# Patient Record
Sex: Male | Born: 1950 | Race: White | Hispanic: No | Marital: Married | Smoking: Former smoker
Health system: Southern US, Community
[De-identification: ages and names within clinical notes are randomized; demographics above are authoritative.]

## PROBLEM LIST (undated history)

## (undated) DIAGNOSIS — C801 Malignant (primary) neoplasm, unspecified: Secondary | ICD-10-CM

## (undated) DIAGNOSIS — Z9889 Other specified postprocedural states: Secondary | ICD-10-CM

## (undated) DIAGNOSIS — K589 Irritable bowel syndrome without diarrhea: Secondary | ICD-10-CM

## (undated) DIAGNOSIS — Z973 Presence of spectacles and contact lenses: Secondary | ICD-10-CM

## (undated) DIAGNOSIS — M48061 Spinal stenosis, lumbar region without neurogenic claudication: Secondary | ICD-10-CM

## (undated) DIAGNOSIS — K219 Gastro-esophageal reflux disease without esophagitis: Secondary | ICD-10-CM

## (undated) DIAGNOSIS — R3915 Urgency of urination: Secondary | ICD-10-CM

## (undated) DIAGNOSIS — M1990 Unspecified osteoarthritis, unspecified site: Secondary | ICD-10-CM

## (undated) DIAGNOSIS — K5900 Constipation, unspecified: Secondary | ICD-10-CM

## (undated) DIAGNOSIS — Z87442 Personal history of urinary calculi: Secondary | ICD-10-CM

## (undated) DIAGNOSIS — R350 Frequency of micturition: Secondary | ICD-10-CM

## (undated) DIAGNOSIS — G43909 Migraine, unspecified, not intractable, without status migrainosus: Secondary | ICD-10-CM

## (undated) DIAGNOSIS — R10A1 Flank pain, right side: Secondary | ICD-10-CM

## (undated) DIAGNOSIS — R109 Unspecified abdominal pain: Secondary | ICD-10-CM

## (undated) DIAGNOSIS — E039 Hypothyroidism, unspecified: Secondary | ICD-10-CM

## (undated) DIAGNOSIS — R011 Cardiac murmur, unspecified: Secondary | ICD-10-CM

## (undated) DIAGNOSIS — T7840XA Allergy, unspecified, initial encounter: Secondary | ICD-10-CM

## (undated) DIAGNOSIS — N289 Disorder of kidney and ureter, unspecified: Secondary | ICD-10-CM

## (undated) DIAGNOSIS — Z85820 Personal history of malignant melanoma of skin: Secondary | ICD-10-CM

## (undated) DIAGNOSIS — G629 Polyneuropathy, unspecified: Secondary | ICD-10-CM

## (undated) DIAGNOSIS — E785 Hyperlipidemia, unspecified: Secondary | ICD-10-CM

## (undated) DIAGNOSIS — Z859 Personal history of malignant neoplasm, unspecified: Secondary | ICD-10-CM

## (undated) DIAGNOSIS — N201 Calculus of ureter: Secondary | ICD-10-CM

## (undated) HISTORY — PX: FRACTURE SURGERY: SHX138

## (undated) HISTORY — PX: HEAD & NECK SKIN LESION EXCISIONAL BIOPSY: SUR472

## (undated) HISTORY — PX: COSMETIC SURGERY: SHX468

## (undated) HISTORY — DX: Migraine, unspecified, not intractable, without status migrainosus: G43.909

## (undated) HISTORY — DX: Allergy, unspecified, initial encounter: T78.40XA

## (undated) HISTORY — PX: TONSILLECTOMY AND ADENOIDECTOMY: SUR1326

## (undated) HISTORY — DX: Gastro-esophageal reflux disease without esophagitis: K21.9

## (undated) HISTORY — DX: Polyneuropathy, unspecified: G62.9

## (undated) HISTORY — PX: JOINT REPLACEMENT: SHX530

## (undated) HISTORY — DX: Irritable bowel syndrome, unspecified: K58.9

## (undated) HISTORY — PX: HERNIA REPAIR: SHX51

## (undated) HISTORY — PX: MENISCUS REPAIR: SHX5179

## (undated) HISTORY — PX: BREAST SURGERY: SHX581

## (undated) HISTORY — PX: CARDIOVASCULAR STRESS TEST: SHX262

## (undated) HISTORY — PX: CARDIAC CATHETERIZATION: SHX172

---

## 1990-06-10 HISTORY — PX: KNEE ARTHROSCOPY W/ ACL RECONSTRUCTION: SHX1858

## 2001-02-20 ENCOUNTER — Ambulatory Visit (HOSPITAL_COMMUNITY): Admission: RE | Admit: 2001-02-20 | Discharge: 2001-02-20 | Payer: Self-pay | Admitting: Otolaryngology

## 2001-02-20 ENCOUNTER — Encounter: Payer: Self-pay | Admitting: Otolaryngology

## 2001-05-14 ENCOUNTER — Emergency Department (HOSPITAL_COMMUNITY): Admission: EM | Admit: 2001-05-14 | Discharge: 2001-05-14 | Payer: Self-pay | Admitting: Emergency Medicine

## 2001-05-14 ENCOUNTER — Encounter: Payer: Self-pay | Admitting: Emergency Medicine

## 2002-02-09 ENCOUNTER — Ambulatory Visit (HOSPITAL_COMMUNITY): Admission: RE | Admit: 2002-02-09 | Discharge: 2002-02-09 | Payer: Self-pay | Admitting: Internal Medicine

## 2003-03-02 ENCOUNTER — Encounter: Payer: Self-pay | Admitting: Orthopaedic Surgery

## 2003-03-02 ENCOUNTER — Ambulatory Visit (HOSPITAL_COMMUNITY): Admission: RE | Admit: 2003-03-02 | Discharge: 2003-03-02 | Payer: Self-pay | Admitting: Orthopaedic Surgery

## 2003-04-19 ENCOUNTER — Ambulatory Visit (HOSPITAL_COMMUNITY): Admission: RE | Admit: 2003-04-19 | Discharge: 2003-04-19 | Payer: Self-pay | Admitting: Family Medicine

## 2004-06-10 HISTORY — PX: SHOULDER ARTHROSCOPY WITH OPEN ROTATOR CUFF REPAIR: SHX6092

## 2004-12-06 ENCOUNTER — Ambulatory Visit (HOSPITAL_COMMUNITY): Admission: RE | Admit: 2004-12-06 | Discharge: 2004-12-06 | Payer: Self-pay | Admitting: Family Medicine

## 2005-03-25 ENCOUNTER — Ambulatory Visit (HOSPITAL_COMMUNITY): Admission: RE | Admit: 2005-03-25 | Discharge: 2005-03-25 | Payer: Self-pay | Admitting: Family Medicine

## 2005-04-15 ENCOUNTER — Ambulatory Visit: Payer: Self-pay

## 2006-08-22 ENCOUNTER — Ambulatory Visit: Payer: Self-pay | Admitting: Internal Medicine

## 2006-08-25 ENCOUNTER — Encounter (INDEPENDENT_AMBULATORY_CARE_PROVIDER_SITE_OTHER): Payer: Self-pay | Admitting: *Deleted

## 2006-08-25 ENCOUNTER — Ambulatory Visit (HOSPITAL_COMMUNITY): Admission: RE | Admit: 2006-08-25 | Discharge: 2006-08-25 | Payer: Self-pay | Admitting: Internal Medicine

## 2006-08-25 ENCOUNTER — Ambulatory Visit: Payer: Self-pay | Admitting: Internal Medicine

## 2006-09-04 ENCOUNTER — Encounter (HOSPITAL_COMMUNITY): Admission: RE | Admit: 2006-09-04 | Discharge: 2006-10-04 | Payer: Self-pay | Admitting: Family Medicine

## 2006-09-24 ENCOUNTER — Ambulatory Visit: Payer: Self-pay | Admitting: Internal Medicine

## 2006-10-02 ENCOUNTER — Ambulatory Visit: Payer: Self-pay | Admitting: Internal Medicine

## 2006-11-13 ENCOUNTER — Ambulatory Visit: Payer: Self-pay | Admitting: Internal Medicine

## 2007-02-02 ENCOUNTER — Ambulatory Visit (HOSPITAL_COMMUNITY): Admission: RE | Admit: 2007-02-02 | Discharge: 2007-02-02 | Payer: Self-pay | Admitting: Family Medicine

## 2007-07-02 ENCOUNTER — Ambulatory Visit (HOSPITAL_COMMUNITY): Admission: RE | Admit: 2007-07-02 | Discharge: 2007-07-02 | Payer: Self-pay | Admitting: *Deleted

## 2007-07-30 HISTORY — PX: TRANSTHORACIC ECHOCARDIOGRAM: SHX275

## 2007-10-21 ENCOUNTER — Ambulatory Visit (HOSPITAL_BASED_OUTPATIENT_CLINIC_OR_DEPARTMENT_OTHER): Admission: RE | Admit: 2007-10-21 | Discharge: 2007-10-21 | Payer: Self-pay | Admitting: Surgery

## 2007-10-21 HISTORY — PX: INGUINAL HERNIA REPAIR: SHX194

## 2007-11-03 ENCOUNTER — Ambulatory Visit (HOSPITAL_COMMUNITY): Admission: RE | Admit: 2007-11-03 | Discharge: 2007-11-03 | Payer: Self-pay | Admitting: Family Medicine

## 2007-12-29 ENCOUNTER — Encounter (HOSPITAL_COMMUNITY): Admission: RE | Admit: 2007-12-29 | Discharge: 2008-01-28 | Payer: Self-pay | Admitting: Family Medicine

## 2009-03-22 ENCOUNTER — Ambulatory Visit (HOSPITAL_COMMUNITY): Admission: RE | Admit: 2009-03-22 | Discharge: 2009-03-22 | Payer: Self-pay | Admitting: Family Medicine

## 2009-06-26 ENCOUNTER — Emergency Department (HOSPITAL_COMMUNITY): Admission: EM | Admit: 2009-06-26 | Discharge: 2009-06-26 | Payer: Self-pay | Admitting: Emergency Medicine

## 2009-10-23 ENCOUNTER — Ambulatory Visit (HOSPITAL_COMMUNITY): Admission: RE | Admit: 2009-10-23 | Discharge: 2009-10-23 | Payer: Self-pay | Admitting: Family Medicine

## 2009-10-23 ENCOUNTER — Ambulatory Visit (HOSPITAL_COMMUNITY): Admission: RE | Admit: 2009-10-23 | Discharge: 2009-10-23 | Payer: Self-pay | Admitting: Internal Medicine

## 2010-07-01 ENCOUNTER — Encounter: Payer: Self-pay | Admitting: Family Medicine

## 2010-08-26 LAB — DIFFERENTIAL
Basophils Relative: 0 % (ref 0–1)
Eosinophils Absolute: 0 10*3/uL (ref 0.0–0.7)
Lymphocytes Relative: 21 % (ref 12–46)
Lymphs Abs: 0.9 10*3/uL (ref 0.7–4.0)
Monocytes Relative: 10 % (ref 3–12)
Neutrophils Relative %: 68 % (ref 43–77)

## 2010-08-26 LAB — URINALYSIS, ROUTINE W REFLEX MICROSCOPIC
Bilirubin Urine: NEGATIVE
Glucose, UA: NEGATIVE mg/dL
Hgb urine dipstick: NEGATIVE
Ketones, ur: NEGATIVE mg/dL
Nitrite: NEGATIVE
Protein, ur: NEGATIVE mg/dL
Specific Gravity, Urine: 1.005 (ref 1.005–1.030)
Urobilinogen, UA: 0.2 mg/dL (ref 0.0–1.0)
pH: 6.5 (ref 5.0–8.0)

## 2010-08-26 LAB — COMPREHENSIVE METABOLIC PANEL
CO2: 29 mEq/L (ref 19–32)
Calcium: 9.4 mg/dL (ref 8.4–10.5)
Creatinine, Ser: 1.42 mg/dL (ref 0.4–1.5)
Glucose, Bld: 107 mg/dL — ABNORMAL HIGH (ref 70–99)

## 2010-08-26 LAB — CBC
MCHC: 33.4 g/dL (ref 30.0–36.0)
RDW: 13 % (ref 11.5–15.5)

## 2010-10-23 NOTE — Op Note (Signed)
NAMESAMUEL, MCPEEK                ACCOUNT NO.:  [id number]   MEDICAL RECORD NO.:  [id number]          PATIENT TYPE:  AMB   LOCATION:  DSC                          FACILITY:  [facility]   PHYSICIAN:  Thornton Park. Daphine Deutscher, MD  DATE OF BIRTH:  07-24-1950   DATE OF PROCEDURE:  10/21/2007  DATE OF DISCHARGE:                               OPERATIVE REPORT   PREOPERATIVE DIAGNOSIS:  Left inguinal hernia.   POSTOPERATIVE DIAGNOSIS:  Left direct inguinal hernia   SURGEON:  Molli Hazard B. Daphine Deutscher, MD   ANESTHESIA:  General endotracheal.   DESCRIPTION OF PROCEDURE:  Mr. Ruark was marked and the site identified  and carried back to room 4 at [facility].  On Oct 21, 2007, given  general anesthesia.  The abdomen was clipped and then prepped with  Technicare and draped sterilely.  A small oblique incision was made in  the left lower quadrant, carried down and identified the external  oblique fibers which I divided and I mobilized to the left of this cord.  He has had a previous of vasectomy and he had orchitis and yet to have  an orchiectomy.  I went ahead and mobilized his structure as it was,  finding no indirect hernia but finding a very large direct hernia.  I  repaired that primarily with a piece of 2-0 Prolene running from below  up and that completely reduced the hernia.  On top of that, I then had  an onlay of Ultrapro mesh suturing the inguinal ligament, then I sutured  it to itself with a piece of 2-0 Prolene.  This was then tucked beneath  the external oblique fibers and closed with a running 2-0 Vicryl.  Vicryl 4-0 was used in the subcutaneous tissue and then the wound was  closed with 4-0 Vicryl, Benzoin, and Steri-Strips.  The patient was  given prescription for Tylox to take for pain.  He will be followed in  the office in about four weeks.      Thornton Park Daphine Deutscher, MD  Electronically Signed     MBM/MEDQ  D:  10/21/2007  T:  10/22/2007  Job:  [id number]   cc:   Patrica Duel,  M.D.  [employer], M.D.

## 2010-10-23 NOTE — Assessment & Plan Note (Signed)
NAME:  Frank Weber, Frank Weber                 CHART#:  [id number]   DATE:  11/13/2006                       DOB:  11/13/1950   PRESENTING COMPLAINTS:  Follow up for constipation.   SUBJECTIVE:  Frank Weber is a 60 year old Caucasian male who is here for  scheduled visit.  He was last seen October 02, 2006.  He was having  problems at that time and was felt to be multifactorial.  It was felt  having problems coming off Nortriptyline.  He is now back on it.  He  just had colonoscopy on August 25, 2006.  He had diffuse small  diverticula sigmoid colon and hyperplastic polyp was removed from his  splenic flexure.  He also had internal and external hemorrhoids.  He  states he is doing better.  He is trying to eat a lot more fruits and  vegetables.  He also drinks plenty of water and tries to walk more than  he has.  He states he has BM every day, 1 or 2 times and he just passes  balls; however, once a week he has 1 large bowel movement, what he  considers to be normal.  He denies melena or rectal bleeding.  He also  denies nausea or vomiting.  He states he is planning to get his inguinal  hernia fixed at some point.   He is on:  1. Topamax 75 mg q. h.s.  2. Nortriptyline 50 mg daily.  3. Simvastatin 80 mg d  4. ASA 81 mg daily.  5. Maxalt p.r.n.  6. Metamucil 3 g b.i.d.  7. Prilosec OTC 20 mg b.i.d.   OBJECTIVE:  VITAL SIGNS:  Weight 246 lb.  He is 6 feet 3 inches tall.  Pulse 74 a minutes, blood pressure 120/80, temperature 97.9.  ABDOMEN:  Bowel sounds are normal.  On palpation it is soft and  nontender.   ASSESSMENT:  Chronic constipation possibly related to his medicines.  He  is doing better with dietary measures.  He did try MiraLax but never  could find the optimal dose and, therefore, quit taking it.   PLAN:  He will continue a high-fiber diet, Metamucil at 6 g per day and  add Colace 2-3 tablets q. h.s.  He will try this combination for 2  months.  If it does not help will consider  Amitiza.   The patient will call us with progress report.       Lionel December, M.D.  Electronically Signed     NR/MEDQ  D:  11/13/2006  T:  11/13/2006  Job:  [id number]   cc:   Patrica Duel, M.D.

## 2010-10-26 NOTE — Consult Note (Signed)
NAME:  Frank Weber, Frank Weber                ACCOUNT NO.:  [id number]   MEDICAL RECORD NO.:  [id number]          PATIENT TYPE:  AMB   LOCATION:  DAY                           FACILITY:  [facility]   PHYSICIAN:  Lionel December, M.D.    DATE OF BIRTH:  02-Aug-1950   DATE OF CONSULTATION:  08/22/2006  DATE OF DISCHARGE:                                 CONSULTATION   PRESENTING COMPLAINT:  Recent onset of constipation and rectal bleeding.   HISTORY OF PRESENT ILLNESS:  Frank Weber is a 60 year old Caucasian male, who is  referred through courtesy of Dr. Nobie Putnam for GI evaluation and possible  colonoscopy.  The patient states he was in the usual state of health  until December 2007 when he had a bad cold and had to be on antibiotic  and some cough medicine.  He developed severe constipation and lower  abdominal pain.  He states he had to take multiple laxatives for relief.  He feels he got impacted.  Since then, he has had problems with his  bowels.  He is on MiraLax.  He states he has 2-3 BMs per day, but his  stools are never normal.  He either is passing balls or flat stools or  thin caliber stool but every now and then he has a normal caliber stool.  He passed a lot of fresh blood when he was constipated but now the  amount is small and intermittent.  He also has noted a large hemorrhoid  which is not painful.  He is maintaining his appetite and his weight.  He denies nausea or vomiting.  He feels his heartburn is well-controlled  with therapy.   Then had a colonoscopy because of rectal bleeding in September 2003.  He  had single small polyp removal via cold biopsy from rectosigmoid  junction which was hyperplastic.  He had moderate size internal and  external hemorrhoids felt to be the source of his bleeding.   Then had abdominopelvic CT with oral contrast by Dr. Nobie Putnam on  July 24, 2006.  It was negative for urolithiasis.  Dr. Stephani Police  ,who read the study felt it was essentially negative  noncontrast CT of  abdomen and pelvis.  However, under description he mentions the sigmoid  colon is relatively small in caliber but no wall thickening or mass  noted.   He is on Topamax 75 mg q.h.s., nortriptyline 50 mg q.h.s., simvastatin  80 mg daily, Prevacid 30 mg b.i.d. ASA 1 mg daily, Maxalt 10 mg daily  p.r.n. which is no more than couple times a month, MiraLax 17 g daily.   PAST MEDICAL HISTORY:  1. Chronic GERD.  He has had symptoms more than 12-15 years.  2. He had EGD in December 1996 revealing small sliding hiatal hernia,      normal-appearing esophagus, and erosive gastritis but his CLO-test      was negative.  3. History of migraine thank you.   He, in addition to colonoscopy of 2003, he had one in August 1999 by Dr.  Lovell Sheehan which was negative other than hemorrhoids.  He was recently found to have borderline or elevated TSH and is being  evaluated by Dr. Nobie Putnam.  He has hyperlipidemia.  He was recently  found to have mild aortic valve disease.  He has some calcification.  He  is under care of Dr. Domingo Sep.  He is under care of Dr. Domingo Sep.  The  patient had tonsillectomy in 1967.  He had right knee arthroscopy  initially in 1982 for torn cartilage and for ACL repair in 1992.  He had  surgery on his right shoulder for rotator cuff tear as well as to remove  spurs 2006.   ALLERGIES:  TO SULFA AND TYLOX BOTH CAUSING NAUSEA AND RASH.   FAMILY HISTORY:  Mother died of CHF at age 33 and father died of  prostate carcinoma at age 35.  He has 2 sisters and 3 brothers in good  health.   SOCIAL HISTORY:  He is married.  He has 1 son.  He worked with the  [employer] for 28 years and since December 2006, he  has been working with [employer].  He has smoked cigarettes for  26-27 years less than half a pack a pack a day but quit 8 months ago.  He drinks alcohol very occasionally.   PHYSICAL EXAM:  GENERAL:  Pleasant mildly obese Caucasian male  who is in  no acute distress.  VITAL SIGNS:  He weighs 240 pounds.  He is 6 feet 3 inches tall.  Pulse  80 per minute, blood pressure 118/88, temperature is 97.9.  HEENT:  Conjunctivae is pink.  Sclerae is nonicteric.  Oral pharyngeal  mucosa is normal.  NECK:  No neck masses are noted.  CARDIAC EXAM:  Regular rhythm.  Normal S1-S3.  No murmur or gallop  noted.  LUNGS:  Clear to auscultation.  ABDOMEN:  Symmetrical, bowel sounds are normal on palpation, soft and  nontender without organomegaly or masses.  RECTAL:  Examination reveals central tag posteriorly.  Digital exam is  normal.  He has formed stool in the vault which is guaiac-negative.  No  peripheral edema or clubbing noted.   CT findings as above.   ASSESSMENT:  Frank Weber is a 60 year old Caucasian male who presents with 3-  month history of constipation and hematochezia.  He is very concerned  that he has colorectal carcinoma but change in caliber for stools is not  a fixed abnormality; so therefore highly unlikely that we are dealing  with colon carcinoma.  Not mentioned above, his maternal grandfather was  diagnosed with colon carcinoma in his mid 26s.  However, he does not  have first-degree relative with CRC.  I suspect his constipation is  partly the result of nortriptyline.  He tells me that an attempt was  made in the past to get him off, but it did not work.   RECOMMENDATIONS:  A colonoscopy to be performed at the [facility] in the near  future.  I have reviewed the procedures with the patient; he is  agreeable.   As far as his hemorrhoids are concerned, I believe these can be treated  with banding for which he will have to be referred to Dr. Lovell Sheehan, who  is his surgeon.   Further changes in therapy regarding his constipation will be made  depending on colonoscopic findings.   We appreciate the opportunity to participate in the care of this  gentleman.     Lionel December, M.D.  Electronically Signed     NR/MEDQ  D:  08/22/2006  T:  08/22/2006  Job:  [id number]   cc:   Patrica Duel, M.D.  Fax: [phone]

## 2010-10-26 NOTE — Op Note (Signed)
NAMEISMAEL, Frank Weber                ACCOUNT NO.:  [id number]   MEDICAL RECORD NO.:  [id number]          PATIENT TYPE:  AMB   LOCATION:  DAY                           FACILITY:  [facility]   PHYSICIAN:  Lionel December, M.D.    DATE OF BIRTH:  Jul 22, 1950   DATE OF PROCEDURE:  08/25/2006  DATE OF DISCHARGE:                               OPERATIVE REPORT   PROCEDURE:  Colonoscopy.   INDICATION:  [name] is a 60 year old Caucasian male with recent onset of  constipation, change in his stool caliber and hematochezia.  He is  undergoing diagnostic colonoscopy.  He did have a CT which suggested  narrowing to the sigmoid colon, but no mass noted.  Procedure risks were  reviewed with the patient;  informed consent was obtained.   MEDICATIONS FOR CONSCIOUS SEDATION:  Demerol 50 mg IV, Versed 8 mg IV.   FINDINGS:  Procedure performed in the endoscopy suite.  The patient's  vital signs and O2 saturations were monitored during the procedure and  remained stable.  The patient was placed in the left lateral recumbent  position and rectal examination performed.  No abnormality noted on  external or digital exam.   Pentax videoscope was placed into the rectum and advanced under vision  into the sigmoid colon and beyond.  Preparation was excellent.  A few  tiny diverticula were noted at sigmoid colon.  There was a 4 mL polyp at  the splenic flexure which was ablated via cold biopsy.   The Scope was advanced to the cecum, which was identified by the  appendiceal orifice.  Pictures taken of the blunt-end of the cecum and  the ileocecal valve.  As the scope was withdrawn, colonic mucosa was  carefully examined and was normal throughout.  Rectal mucosa similarly  was normal.  A few tiny hyperplastic-appearing polyps at the rectum were  left alone.  The scope was retroflexed to examine the anorectal  junction; prominent hemorrhoids were noted below and above the dentate  line.   The endoscope was straightened  and withdrawn.  The patient tolerated the  procedure well.   FINAL DIAGNOSIS:  1. Examination performed to the cecum; no evidence of sigmoid      stricture or mass.  2. A few tiny diverticula at sigmoid colon.  3. Small polyp ablated via cold biopsy from the splenic flexure.  4. Prominent internal/external hemorrhoids.   RECOMMENDATIONS:  1. High-fiber diet plus fiber supplement 4 grams daily.  2. He will continue MiraLax at 17 grams daily.  3. He will keep a stool diary as to frequency of his bowel movements      and hematochezia; and symptomatology would be reviewed with him in      8 weeks.   I will be contacting the patient with the results of biopsy as well.      Lionel December, M.D.  Electronically Signed     NR/MEDQ  D:  08/25/2006  T:  08/25/2006  Job:  [id number]   cc:   Patrica Duel, M.D.  Fax: [phone]

## 2011-03-06 LAB — POCT HEMOGLOBIN-HEMACUE: Hemoglobin: 15.2

## 2011-03-27 ENCOUNTER — Other Ambulatory Visit: Payer: Self-pay | Admitting: Dermatology

## 2011-04-14 ENCOUNTER — Other Ambulatory Visit: Payer: Self-pay

## 2011-04-14 ENCOUNTER — Emergency Department (HOSPITAL_COMMUNITY)
Admission: EM | Admit: 2011-04-14 | Discharge: 2011-04-14 | Disposition: A | Discharge status: Home / Self Care | Payer: PRIVATE HEALTH INSURANCE | Attending: Emergency Medicine | Admitting: Emergency Medicine

## 2011-04-14 ENCOUNTER — Emergency Department (HOSPITAL_COMMUNITY): Payer: PRIVATE HEALTH INSURANCE

## 2011-04-14 DIAGNOSIS — R0602 Shortness of breath: Secondary | ICD-10-CM | POA: Insufficient documentation

## 2011-04-14 DIAGNOSIS — R05 Cough: Secondary | ICD-10-CM | POA: Insufficient documentation

## 2011-04-14 DIAGNOSIS — R079 Chest pain, unspecified: Secondary | ICD-10-CM | POA: Insufficient documentation

## 2011-04-14 DIAGNOSIS — R059 Cough, unspecified: Secondary | ICD-10-CM | POA: Insufficient documentation

## 2011-04-14 DIAGNOSIS — I10 Essential (primary) hypertension: Secondary | ICD-10-CM | POA: Insufficient documentation

## 2011-04-14 DIAGNOSIS — E119 Type 2 diabetes mellitus without complications: Secondary | ICD-10-CM | POA: Insufficient documentation

## 2011-04-14 LAB — BASIC METABOLIC PANEL
BUN: 13 mg/dL (ref 6–23)
GFR calc non Af Amer: 54 mL/min — ABNORMAL LOW (ref >90)
Glucose, Bld: 103 mg/dL — ABNORMAL HIGH (ref 70–99)
Potassium: 4.2 mEq/L (ref 3.5–5.1)

## 2011-04-14 LAB — POCT I-STAT TROPONIN I: Troponin i, poc: 0 ng/mL (ref 0.00–0.08)

## 2011-04-14 LAB — CBC
HCT: 46.5 % (ref 39.0–52.0)
Hemoglobin: 15.9 g/dL (ref 13.0–17.0)
MCHC: 34.2 g/dL (ref 30.0–36.0)

## 2011-04-14 MED ORDER — KETOROLAC TROMETHAMINE 30 MG/ML IJ SOLN: 30.0000 mg | Freq: Once | INTRAMUSCULAR | Status: DC

## 2011-04-14 MED ORDER — ONDANSETRON 4 MG PO TBDP
4.0000 mg | ORAL_TABLET | Freq: Once | ORAL | Status: AC
  Administered 2011-04-14: 4 mg via ORAL

## 2011-04-14 MED ORDER — ONDANSETRON 4 MG PO TBDP
ORAL_TABLET | ORAL | Status: AC
  Filled 2011-04-14: qty 1

## 2011-04-14 NOTE — ED Notes (Signed)
D/c instructions reviewed w/ pt and family - pt and family deny any further questions or concerns at present.\ 

## 2011-04-14 NOTE — ED Provider Notes (Signed)
History     CSN: [id number] Arrival date & time: 04/14/2011  3:47 PM   First MD Initiated Contact with Patient 04/14/11 1941      Chief Complaint  Patient presents with  . Chest Pain    pt in with c/o chest pain radiating to neck states pain in the left shoulder states diaphoretic pt states pain is pressure like in the center of chest states some nausea     (Consider location/radiation/quality/duration/timing/severity/associated sxs/prior treatment) HPI Comments: Patient presents with onset of substernal chest pain that occurred while he was shopping at the [employer].  Was not associated with any shortness of breath and is not pleuritic in nature.  There is no associated nausea vomiting.  Patient had tingling in his left arm.  The pain is not worse with exertion.  He's had this pain intermittently over the last few weeks and has never been related to exertion or is not positional.  He's had no fevers or coughing and no shortness of breath.  He denies any prior cardiac history.  He notes he did have a cardiac catheter in the last 2 years and was told it was essentially a normal catheterization by a physician with [facility].  I am unable to locate these records in our current system at this time.  Patient's pain is was completely gone at the time of my evaluation.  Patient is a 60 y.o. male presenting with chest pain. The history is provided by the patient.  Chest Pain The chest pain began 6 - 12 hours ago. Chest pain occurs constantly. The chest pain is improving. The severity of the pain is moderate. The quality of the pain is described as pressure-like. The pain radiates to the left arm. Pertinent negatives for primary symptoms include no fever, no fatigue, no syncope, no shortness of breath, no cough, no wheezing, no palpitations, no abdominal pain, no nausea, no vomiting, no dizziness and no altered mental status. He tried nothing for the symptoms. Risk factors include  male gender.     Past Medical History  Diagnosis Date  . Hypertension   . Diabetes mellitus     Past Surgical History  Procedure Date  . Tonsillectomy   . Knee cartilage surgery     History reviewed. No pertinent family history.  History  Substance Use Topics  . Smoking status: Former Games developer  . Smokeless tobacco: Not on file  . Alcohol Use: Yes     occasional      Review of Systems  Constitutional: Negative.  Negative for fever, chills and fatigue.  HENT: Negative.   Eyes: Negative.  Negative for discharge and redness.  Respiratory: Negative.  Negative for cough, shortness of breath and wheezing.   Cardiovascular: Positive for chest pain. Negative for palpitations and syncope.  Gastrointestinal: Negative.  Negative for nausea, vomiting and abdominal pain.  Genitourinary: Negative.  Negative for hematuria.  Musculoskeletal: Negative.  Negative for back pain.  Skin: Negative.  Negative for color change and rash.  Neurological: Negative for dizziness, syncope and headaches.  Hematological: Negative.  Negative for adenopathy.  Psychiatric/Behavioral: Negative.  Negative for confusion and altered mental status.  All other systems reviewed and are negative.    Allergies  Sulfa drugs cross reactors  Home Medications   Current Outpatient Rx  Name Route Sig Dispense Refill  . AMITRIPTYLINE HCL 10 MG PO TABS Oral Take 20 mg by mouth at bedtime. PT TAKES 2 TABS FOR 20MG  DOSE     .  VITAMIN D 2000 UNITS PO TABS Oral Take 2,000 Units by mouth daily.      . DULOXETINE HCL 30 MG PO CPEP Oral Take 30 mg by mouth at bedtime.      Marland Kitchen GABAPENTIN 300 MG PO CAPS Oral Take 300 mg by mouth 3 (three) times daily. PT ONLY TAKES IF BURNING IS NOT HANDLED BETWEEN DOSAGES OF THE 800MG      . GABAPENTIN 800 MG PO TABS Oral Take 800 mg by mouth 3 (three) times daily.      Marland Kitchen LORAZEPAM 0.5 MG PO TABS Oral Take 0.5 mg by mouth at bedtime.      . OMEPRAZOLE 20 MG PO CPDR Oral Take 20 mg by mouth  2 (two) times daily.      Marland Kitchen OVER THE COUNTER MEDICATION Oral Take 120 mg by mouth daily. PT TAKES CO-Q-10 120MG .  1 TAB DAILY     . PRAVASTATIN SODIUM 20 MG PO TABS Oral Take 20 mg by mouth daily.      Marland Kitchen ALIGN 4 MG PO CAPS Oral Take 4 mg by mouth daily.      Marland Kitchen RIZATRIPTAN BENZOATE 10 MG PO TBDP Oral Take 10 mg by mouth as needed. May repeat in 2 hours if needed MIGRAINE       BP 132/82  Pulse 61  Temp(Src) 97.4 F (36.3 C) (Oral)  Resp 20  SpO2 96%  Physical Exam  Constitutional: He is oriented to person, place, and time. He appears well-developed and well-nourished.  Non-toxic appearance. He does not have a sickly appearance.  HENT:  Head: Normocephalic and atraumatic.  Eyes: Conjunctivae, EOM and lids are normal. Pupils are equal, round, and reactive to light.  Neck: Trachea normal, normal range of motion and full passive range of motion without pain. Neck supple.  Cardiovascular: Normal rate, regular rhythm and normal heart sounds.   Pulmonary/Chest: Effort normal and breath sounds normal. No respiratory distress.  Abdominal: Soft. Normal appearance. He exhibits no distension. There is no tenderness. There is no rebound and no CVA tenderness.  Musculoskeletal: Normal range of motion.  Neurological: He is alert and oriented to person, place, and time. He has normal strength.  Skin: Skin is warm, dry and intact. No rash noted.  Psychiatric: He has a normal mood and affect. His behavior is normal. Judgment and thought content normal.    ED Course  Procedures (including critical care time)  Labs Reviewed  BASIC METABOLIC PANEL - Abnormal; Notable for the following:    Glucose, Bld 103 (*)    Creatinine, Ser 1.38 (*)    GFR calc non Af Amer 54 (*)    GFR calc Af Amer 63 (*)    All other components within normal limits  CBC  PROTIME-INR  POCT I-STAT TROPONIN I  POCT I-STAT TROPONIN I  POCT CARDIAC MARKERS  BRAIN NATRIURETIC PEPTIDE  I-STAT TROPONIN I   Dg Chest 2  View  04/14/2011  *RADIOLOGY REPORT*  Clinical Data: Chest pain with shortness of breath and cough for 1 day.  CHEST - 2 VIEW  Comparison: 10/23/2009 radiographs.  Findings: The heart size and mediastinal contours are normal. The lungs are clear. There is no pleural effusion or pneumothorax. No acute osseous findings are identified.  IMPRESSION: No active cardiopulmonary process.  Original Report Authenticated By: Gerrianne Scale, M.D.     No diagnosis found.    MDM  Patient is going to be discharged home at this point in time as he is  low risk by TIMI criteria for ACS especially since the patient has had an essentially normal coronary catheter the last 2-3 years.  He does have followup at [facility] vascular as needed.  He has a normal EKG here 2 sets of normal cardiac markers which indicate the patient has not had an acute MI.  His pain is improved at rest.  This pain is not exertional as well and therefore not characteristic for ACS.  Is not pleuritic and is not concerning for PE.  He has normal vital signs.  He has no symptoms for infection.   Date: 04/14/2011  Rate: 66  Rhythm: normal sinus rhythm  QRS Axis: normal  Intervals: normal  ST/T Wave abnormalities: normal  Conduction Disutrbances:none  Narrative Interpretation:   Old EKG Reviewed: unchanged from 10/20/2007          Nat Christen, MD 04/14/11 2225

## 2011-06-05 ENCOUNTER — Other Ambulatory Visit (HOSPITAL_COMMUNITY): Payer: Self-pay | Admitting: Family Medicine

## 2011-06-05 ENCOUNTER — Ambulatory Visit (HOSPITAL_COMMUNITY)
Admission: RE | Admit: 2011-06-05 | Discharge: 2011-06-05 | Disposition: A | Discharge status: Home / Self Care | Payer: PRIVATE HEALTH INSURANCE | Source: Ambulatory Visit | Attending: Family Medicine | Admitting: Family Medicine

## 2011-06-05 DIAGNOSIS — N200 Calculus of kidney: Secondary | ICD-10-CM

## 2011-06-05 DIAGNOSIS — R1031 Right lower quadrant pain: Secondary | ICD-10-CM | POA: Insufficient documentation

## 2011-06-05 DIAGNOSIS — K7689 Other specified diseases of liver: Secondary | ICD-10-CM | POA: Insufficient documentation

## 2011-06-05 DIAGNOSIS — R300 Dysuria: Secondary | ICD-10-CM

## 2011-11-13 ENCOUNTER — Other Ambulatory Visit (HOSPITAL_COMMUNITY): Payer: Self-pay | Admitting: Physician Assistant

## 2011-11-13 ENCOUNTER — Ambulatory Visit (HOSPITAL_COMMUNITY)
Admission: RE | Admit: 2011-11-13 | Discharge: 2011-11-13 | Disposition: A | Discharge status: Home / Self Care | Payer: PRIVATE HEALTH INSURANCE | Source: Ambulatory Visit | Attending: Physician Assistant | Admitting: Physician Assistant

## 2011-11-13 DIAGNOSIS — R059 Cough, unspecified: Secondary | ICD-10-CM | POA: Insufficient documentation

## 2011-11-13 DIAGNOSIS — R079 Chest pain, unspecified: Secondary | ICD-10-CM | POA: Insufficient documentation

## 2011-11-13 DIAGNOSIS — R05 Cough: Secondary | ICD-10-CM | POA: Insufficient documentation

## 2011-11-13 DIAGNOSIS — R0602 Shortness of breath: Secondary | ICD-10-CM | POA: Insufficient documentation

## 2011-11-13 DIAGNOSIS — E119 Type 2 diabetes mellitus without complications: Secondary | ICD-10-CM | POA: Insufficient documentation

## 2011-11-13 DIAGNOSIS — I10 Essential (primary) hypertension: Secondary | ICD-10-CM | POA: Insufficient documentation

## 2012-01-28 ENCOUNTER — Other Ambulatory Visit: Payer: Self-pay | Admitting: Dermatology

## 2012-08-20 ENCOUNTER — Other Ambulatory Visit: Payer: Self-pay | Admitting: Dermatology

## 2012-12-18 ENCOUNTER — Ambulatory Visit (HOSPITAL_COMMUNITY)
Admission: RE | Admit: 2012-12-18 | Discharge: 2012-12-18 | Disposition: A | Discharge status: Home / Self Care | Payer: PRIVATE HEALTH INSURANCE | Source: Ambulatory Visit | Attending: Family Medicine | Admitting: Family Medicine

## 2012-12-18 ENCOUNTER — Other Ambulatory Visit (HOSPITAL_COMMUNITY): Payer: Self-pay | Admitting: Family Medicine

## 2012-12-18 DIAGNOSIS — R05 Cough: Secondary | ICD-10-CM | POA: Insufficient documentation

## 2012-12-18 DIAGNOSIS — E039 Hypothyroidism, unspecified: Secondary | ICD-10-CM

## 2012-12-18 DIAGNOSIS — Z0000 Encounter for general adult medical examination without abnormal findings: Secondary | ICD-10-CM

## 2012-12-18 DIAGNOSIS — R059 Cough, unspecified: Secondary | ICD-10-CM | POA: Insufficient documentation

## 2012-12-18 DIAGNOSIS — E785 Hyperlipidemia, unspecified: Secondary | ICD-10-CM

## 2012-12-18 DIAGNOSIS — R079 Chest pain, unspecified: Secondary | ICD-10-CM | POA: Insufficient documentation

## 2012-12-30 ENCOUNTER — Encounter (INDEPENDENT_AMBULATORY_CARE_PROVIDER_SITE_OTHER): Payer: Self-pay | Admitting: *Deleted

## 2013-01-01 ENCOUNTER — Encounter (INDEPENDENT_AMBULATORY_CARE_PROVIDER_SITE_OTHER): Payer: Self-pay | Admitting: *Deleted

## 2013-01-27 ENCOUNTER — Other Ambulatory Visit (INDEPENDENT_AMBULATORY_CARE_PROVIDER_SITE_OTHER): Payer: Self-pay | Admitting: *Deleted

## 2013-01-27 ENCOUNTER — Ambulatory Visit (INDEPENDENT_AMBULATORY_CARE_PROVIDER_SITE_OTHER): Payer: PRIVATE HEALTH INSURANCE | Admitting: Internal Medicine

## 2013-01-27 ENCOUNTER — Encounter (INDEPENDENT_AMBULATORY_CARE_PROVIDER_SITE_OTHER): Payer: Self-pay | Admitting: Internal Medicine

## 2013-01-27 ENCOUNTER — Telehealth (INDEPENDENT_AMBULATORY_CARE_PROVIDER_SITE_OTHER): Payer: Self-pay | Admitting: *Deleted

## 2013-01-27 VITALS — BP 90/58 | HR 72 | Ht 75.0 in | Wt 239.2 lb

## 2013-01-27 DIAGNOSIS — K589 Irritable bowel syndrome without diarrhea: Secondary | ICD-10-CM

## 2013-01-27 DIAGNOSIS — R195 Other fecal abnormalities: Secondary | ICD-10-CM

## 2013-01-27 DIAGNOSIS — R634 Abnormal weight loss: Secondary | ICD-10-CM

## 2013-01-27 DIAGNOSIS — Z8669 Personal history of other diseases of the nervous system and sense organs: Secondary | ICD-10-CM | POA: Insufficient documentation

## 2013-01-27 DIAGNOSIS — Z1211 Encounter for screening for malignant neoplasm of colon: Secondary | ICD-10-CM

## 2013-01-27 DIAGNOSIS — K219 Gastro-esophageal reflux disease without esophagitis: Secondary | ICD-10-CM

## 2013-01-27 DIAGNOSIS — G609 Hereditary and idiopathic neuropathy, unspecified: Secondary | ICD-10-CM | POA: Insufficient documentation

## 2013-01-27 NOTE — Patient Instructions (Signed)
Colonoscopy.  The risks and benefits such as perforation, bleeding, and infection were reviewed with the patient and is agreeable. 

## 2013-01-27 NOTE — Progress Notes (Signed)
Subjective:     Patient ID: Frank Weber, male   DOB: 12-06-1950, 62 y.o.   MRN: [id number]  HPI Referred to our office for lower abdominal pain.  He says the pain is constant. He describes the pain as burning and dull. Sometimes the pain is crampy with a BM.  Rates pain at a 5/6 at at the worst. He says he has a left hernia repair in the past He tells me he has frequent diarrhea. Stools are soft. Stools are pencil thin sometimes. He also has bouts of constipation.  He was okay up till 3-4 months ago. He saw Dr. Regino Schultze and placed on Dicyclomine once a day. Stools have a strong odor and stools are mushy.   BMs are irregular. He may have one a day or once a week. If he has a BM on any given day, he will have several BMs that day. Then it will be 2-3 days before he has another one.  He also has hard stools. He tells me he has not had a BM since Saturday. Saturday he had a large BM, and large volume. He has not seen any bright red rectal bleeding. He says sometimes, if he strains he will see blood Patient would like to proceed with a colonoscopy.  Doxycycline 3 weeks ago for a "sore in his nose" (?MRSA)His appetite is fair. He has lost 30 pounds over the past year unintentional. (In 2011, his weight was 222). (Weight today 239)  He says he did not diet. He did not consciously lose the weight.   08/18/2009 Flexible sigmoidoscopy: hx of constipation which has gotten worse. Hx of fecal impaction: A few scattered diverticula at the sigmoid colon and external/internal hemorrhoids, otherwise, normal flexible sigmoidoscopy.    08/2006 Colonoscopy:  FINAL DIAGNOSIS:  1. Examination performed to the cecum; no evidence of sigmoid  stricture or mass.  2. A few tiny diverticula at sigmoid colon.  3. Small polyp ablated via cold biopsy from the splenic flexure.  4. Prominent internal/external hemorrhoids.  Biopsy:   COLON, BIOPSY AT SPLENIC FLEXURE: HYPERPLASTIC POLYP. NO ADENOMATOUS CHANGE OR MALIGNANCY  IDENTIFIED.  12/18/2012 H and H 15.8 and 45.6, MCV 87.5, NA 137, K 4.5, Chloride 104, Glucose 99, BUN 1.54, ALP 75, AST 14, ALT 10, Total protein 6.4, Albmumin 4.4, Calcium 9.7    Married, works at [employer]. Retired from [employer]. He has one child in good health age 58.  Grandfather with colon cancer age.Diagnosed in late 61s. Review of Systems see hpi Current Outpatient Prescriptions  Medication Sig Dispense Refill  . dicyclomine (BENTYL) 10 MG capsule Take 10 mg by mouth daily.      . DULoxetine (CYMBALTA) 30 MG capsule Take 30 mg by mouth at bedtime.        . gabapentin (NEURONTIN) 300 MG capsule Take 300 mg by mouth. PT ONLY TAKES IF BURNING IS NOT HANDLED BETWEEN DOSAGES OF THE 800MG       . LORazepam (ATIVAN) 0.5 MG tablet Take 0.5 mg by mouth at bedtime.        Marland Kitchen omeprazole (PRILOSEC) 20 MG capsule Take 20 mg by mouth 2 (two) times daily.        . pravastatin (PRAVACHOL) 20 MG tablet Take 20 mg by mouth daily.        . Probiotic Product (ALIGN) 4 MG CAPS Take 4 mg by mouth daily.        . rizatriptan (MAXALT-MLT) 10 MG disintegrating tablet Take 10  mg by mouth as needed. May repeat in 2 hours if needed MIGRAINE       . topiramate (TOPAMAX) 25 MG capsule Take 25 mg by mouth daily.      Marland Kitchen gabapentin (NEURONTIN) 800 MG tablet Take 800 mg by mouth 3 (three) times daily.         No current facility-administered medications for this visit.   Past Medical History  Diagnosis Date  . Migraines   . GERD (gastroesophageal reflux disease)   . IBS (irritable bowel syndrome)   . Peripheral neuropathy     cause unknown after having a bad cold x 4 yrs   Past Surgical History  Procedure Laterality Date  . Tonsillectomy    . Knee cartilage surgery    . Skin cancer excision      rt ear  . Melanoma excision      forehead  . Knee arthroscopy      rt  . Rotator cuff repair      rt shoulder  . Left inguinal hernia      repair  . Tonsillectomy     Allergies   Allergen Reactions  . Sulfa Drugs Cross Reactors Nausea And Vomiting and Rash        Objective:   Physical Exam  Filed Vitals:   01/27/13 0946  BP: 90/58  Pulse: 72  Height: 6\' 3"  (1.905 m)  Weight: 239 lb 3.2 oz (108.5 kg)  Alert and oriented. Skin warm and dry. Oral mucosa is moist.   . Sclera anicteric, conjunctivae is pink. Thyroid not enlarged. No cervical lymphadenopathy. Lungs clear. Heart regular rate and rhythm.  Abdomen is soft. Bowel sounds are positive. No hepatomegaly. No abdominal masses felt. Tender lower abdomen, bilaterally. Small amt of  edema to lower extremities (ankles).        Assessment:    Change in stools. Symptoms started approximately 3-4 months ago. His stools at times are smaller. He alternates between constipation and loose stools. Hx of IBS in the past. Family hx of colon cancer in a grandfather, however he was in his 67s when he was diagnosed. Patient would like to proceed with a colonoscopy   He also has had weight loss which he says was unintentional. Colonic neoplasm needs to be ruled out.  Plan:    Metamcuil daily. Colonoscopy with Dr. Karilyn Cota.

## 2013-01-27 NOTE — Telephone Encounter (Signed)
Patient needs movi prep 

## 2013-01-28 MED ORDER — PEG-KCL-NACL-NASULF-NA ASC-C 100 G PO SOLR: 1.0000 | Freq: Once | ORAL | Status: DC

## 2013-02-02 ENCOUNTER — Encounter (HOSPITAL_COMMUNITY): Payer: Self-pay | Admitting: Pharmacy Technician

## 2013-02-17 ENCOUNTER — Encounter (HOSPITAL_COMMUNITY)
Admission: RE | Disposition: A | Discharge status: Home / Self Care | Payer: Self-pay | Source: Ambulatory Visit | Attending: Internal Medicine

## 2013-02-17 ENCOUNTER — Ambulatory Visit (HOSPITAL_COMMUNITY)
Admission: RE | Admit: 2013-02-17 | Discharge: 2013-02-17 | Disposition: A | Discharge status: Home / Self Care | Payer: PRIVATE HEALTH INSURANCE | Source: Ambulatory Visit | Attending: Internal Medicine | Admitting: Internal Medicine

## 2013-02-17 ENCOUNTER — Encounter (HOSPITAL_COMMUNITY): Payer: Self-pay | Admitting: *Deleted

## 2013-02-17 DIAGNOSIS — K644 Residual hemorrhoidal skin tags: Secondary | ICD-10-CM | POA: Insufficient documentation

## 2013-02-17 DIAGNOSIS — R195 Other fecal abnormalities: Secondary | ICD-10-CM

## 2013-02-17 DIAGNOSIS — R109 Unspecified abdominal pain: Secondary | ICD-10-CM

## 2013-02-17 DIAGNOSIS — D128 Benign neoplasm of rectum: Secondary | ICD-10-CM

## 2013-02-17 DIAGNOSIS — K5730 Diverticulosis of large intestine without perforation or abscess without bleeding: Secondary | ICD-10-CM

## 2013-02-17 DIAGNOSIS — K921 Melena: Secondary | ICD-10-CM

## 2013-02-17 DIAGNOSIS — R198 Other specified symptoms and signs involving the digestive system and abdomen: Secondary | ICD-10-CM

## 2013-02-17 DIAGNOSIS — D129 Benign neoplasm of anus and anal canal: Secondary | ICD-10-CM

## 2013-02-17 HISTORY — DX: Hyperlipidemia, unspecified: E78.5

## 2013-02-17 HISTORY — PX: COLONOSCOPY: SHX5424

## 2013-02-17 SURGERY — COLONOSCOPY
Anesthesia: Moderate Sedation

## 2013-02-17 MED ORDER — MIDAZOLAM HCL 5 MG/5ML IJ SOLN
INTRAMUSCULAR | Status: AC
  Filled 2013-02-17: qty 80

## 2013-02-17 MED ORDER — STERILE WATER FOR IRRIGATION IR SOLN
Status: DC | PRN
  Administered 2013-02-17: 14:00:00

## 2013-02-17 MED ORDER — SODIUM CHLORIDE 0.9 % IV SOLN: INTRAVENOUS | Status: DC

## 2013-02-17 MED ORDER — MEPERIDINE HCL 50 MG/ML IJ SOLN
INTRAMUSCULAR | Status: AC
  Filled 2013-02-17: qty 1

## 2013-02-17 MED ORDER — MIDAZOLAM HCL 5 MG/5ML IJ SOLN
INTRAMUSCULAR | Status: DC | PRN
  Administered 2013-02-17 (×4): 2 mg via INTRAVENOUS

## 2013-02-17 MED ORDER — MEPERIDINE HCL 50 MG/ML IJ SOLN
INTRAMUSCULAR | Status: DC | PRN
  Administered 2013-02-17 (×2): 25 mg via INTRAVENOUS

## 2013-02-17 NOTE — Op Note (Signed)
COLONOSCOPY PROCEDURE REPORT  PATIENT:  Frank Weber  MR#:  [id number] Birthdate:  Feb 11, 1951, 62 y.o., male Endoscopist:  Dr. Malissa Hippo, MD Referred By:  Dr. Kirk Ruths, MD Procedure Date: 02/17/2013  Procedure:   Colonoscopy  Indications: Patient is 62 year old Caucasian male is undergoing diagnostic colonoscopy for change in caliber of stools lower abdominal pain. He also has intermittent hematochezia most likely secondary to hemorrhoids. His last full colonoscopy was in March 2008. Family history significant for colon carcinoma in maternal grandfather who was in the 27s at the time of diagnosis.  Informed Consent:  The procedure and risks were reviewed with the patient and informed consent was obtained.  Medications:  Demerol 50 mg IV Versed 8 mg IV  Description of procedure:  After a digital rectal exam was performed, that colonoscope was advanced from the anus through the rectum and colon to the area of the cecum, ileocecal valve and appendiceal orifice. The cecum was deeply intubated. These structures were well-seen and photographed for the record. From the level of the cecum and ileocecal valve, the scope was slowly and cautiously withdrawn. The mucosal surfaces were carefully surveyed utilizing scope tip to flexion to facilitate fold flattening as needed. The scope was pulled down into the rectum where a thorough exam including retroflexion was performed.  Findings:   Prep satisfactory. Few scattered diverticula noted at sigmoid colon. 4 small polyps in the region of rectosigmoid junction. These were ablated via cold biopsy and submitted together. Normal rectal mucosa. Prominent hemorrhoids below the dentate line.   Therapeutic/Diagnostic Maneuvers Performed:  None  Complications:  None  Cecal Withdrawal Time:  15 minutes  Impression:  Examination performed to cecum. Mild sigmoid colon diverticulosis. 4 small polyps ablated via cold biopsy from rectosigmoid  junction. External hemorrhoids.  Suspect symptoms secondary to IBS.  Recommendations:  Standard instructions given. I will contact patient with biopsy results and further recommendations.  Denasia Venn U  02/17/2013 3:01 PM  CC: Dr. Kirk Ruths, MD & Dr. Bonnetta Barry ref. provider found

## 2013-02-17 NOTE — H&P (Signed)
Frank Weber is an 62 y.o. male.   Chief Complaint: Patient is here for colonoscopy. HPI: Patient is 7 old Caucasian male who presents with change in bowel habits. As noted decrease in caliber of his stools and no abdominal pain. Instituted that her with defecation. He has good appetite. He denies weight loss. He also is intermittent hematochezia presumed to be secondary hemorrhoids. Patient's last colonoscopy was in March 2008 with removal of one polyp which was hyperplastic. He also had flexible sigmoidoscopy in March 2011 admitting sigmoid diverticula and hemorrhoids. Family history is significant for colon carcinoma in maternal grandfather was in the 36s at the time of diagnosis.  Past Medical History  Diagnosis Date  . Migraines   . GERD (gastroesophageal reflux disease)   . IBS (irritable bowel syndrome)   . Peripheral neuropathy     cause unknown after having a bad cold x 4 yrs  . Hyperlipidemia     Past Surgical History  Procedure Laterality Date  . Tonsillectomy    . Knee cartilage surgery Right   . Skin cancer excision      rt ear  . Melanoma excision      forehead  . Knee arthroscopy  x2    rt  . Rotator cuff repair      rt shoulder  . Left inguinal hernia      repair  . Tonsillectomy      Family History  Problem Relation Age of Onset  . Colon cancer Other     Age of Onset-late 51's   Social History:  reports that he has quit smoking. His smoking use included Cigarettes. He has a 30 pack-year smoking history. He quit smokeless tobacco use about 7 years ago. He reports that  drinks alcohol. He reports that he does not use illicit drugs.  Allergies:  Allergies  Allergen Reactions  . Sulfa Drugs Cross Reactors Nausea And Vomiting and Rash    Medications Prior to Admission  Medication Sig Dispense Refill  . dicyclomine (BENTYL) 10 MG capsule Take 10 mg by mouth daily.      . DULoxetine (CYMBALTA) 30 MG capsule Take 30 mg by mouth at bedtime.        .  gabapentin (NEURONTIN) 300 MG capsule Take 300 mg by mouth. PT ONLY TAKES IF BURNING IS NOT HANDLED BETWEEN DOSAGES OF THE 800MG       . gabapentin (NEURONTIN) 800 MG tablet Take 800 mg by mouth 3 (three) times daily.        Marland Kitchen LORazepam (ATIVAN) 0.5 MG tablet Take 0.5 mg by mouth at bedtime.        Marland Kitchen omeprazole (PRILOSEC) 20 MG capsule Take 20 mg by mouth 2 (two) times daily.        . peg 3350 powder (MOVIPREP) 100 G SOLR Take 1 kit (200 g total) by mouth once.  1 kit  0  . pravastatin (PRAVACHOL) 20 MG tablet Take 20 mg by mouth daily.        . Probiotic Product (ALIGN) 4 MG CAPS Take 4 mg by mouth daily.        . rizatriptan (MAXALT-MLT) 10 MG disintegrating tablet Take 10 mg by mouth as needed. May repeat in 2 hours if needed MIGRAINE       . topiramate (TOPAMAX) 25 MG capsule Take 75 mg by mouth at bedtime.         No results found for this or any previous visit (from the past 48 hour(s)).  No results found.  ROS  Blood pressure 118/87, temperature 97.9 F (36.6 C), temperature source Oral, resp. rate 25, SpO2 96.00%. Physical Exam  Constitutional: He appears well-developed and well-nourished.  HENT:  Mouth/Throat: Oropharynx is clear and moist.  Eyes: Conjunctivae are normal. No scleral icterus.  Neck: No thyromegaly present.  Cardiovascular: Normal rate, regular rhythm and normal heart sounds.   No murmur heard. Respiratory: Effort normal and breath sounds normal.  GI: Soft. He exhibits no distension and no mass. There is no tenderness.  Musculoskeletal: He exhibits no edema.  Lymphadenopathy:    He has no cervical adenopathy.  Neurological: He is alert.  Skin: Skin is warm and dry.     Assessment/Plan Change in bowel habit habits. Hematochezia. Diagnostic colonoscopy.  REHMAN,NAJEEB U 02/17/2013, 2:07 PM

## 2013-02-22 ENCOUNTER — Encounter (HOSPITAL_COMMUNITY): Payer: Self-pay | Admitting: Internal Medicine

## 2013-02-23 ENCOUNTER — Encounter (INDEPENDENT_AMBULATORY_CARE_PROVIDER_SITE_OTHER): Payer: Self-pay | Admitting: *Deleted

## 2013-04-16 ENCOUNTER — Encounter (INDEPENDENT_AMBULATORY_CARE_PROVIDER_SITE_OTHER): Payer: Self-pay

## 2013-06-15 ENCOUNTER — Ambulatory Visit: Payer: PRIVATE HEALTH INSURANCE | Attending: Otolaryngology | Admitting: Speech Pathology

## 2013-06-15 DIAGNOSIS — IMO0001 Reserved for inherently not codable concepts without codable children: Secondary | ICD-10-CM | POA: Insufficient documentation

## 2013-06-15 DIAGNOSIS — R490 Dysphonia: Secondary | ICD-10-CM | POA: Insufficient documentation

## 2013-06-22 ENCOUNTER — Ambulatory Visit: Payer: PRIVATE HEALTH INSURANCE | Admitting: Speech Pathology

## 2013-06-29 ENCOUNTER — Ambulatory Visit: Payer: PRIVATE HEALTH INSURANCE | Admitting: Speech Pathology

## 2013-07-13 ENCOUNTER — Ambulatory Visit: Payer: PRIVATE HEALTH INSURANCE | Admitting: Speech Pathology

## 2013-07-20 ENCOUNTER — Encounter: Payer: PRIVATE HEALTH INSURANCE | Admitting: Speech Pathology

## 2013-07-27 ENCOUNTER — Encounter: Payer: PRIVATE HEALTH INSURANCE | Admitting: Speech Pathology

## 2014-07-18 ENCOUNTER — Other Ambulatory Visit: Payer: Self-pay | Admitting: Orthopedic Surgery

## 2014-07-20 ENCOUNTER — Encounter (HOSPITAL_BASED_OUTPATIENT_CLINIC_OR_DEPARTMENT_OTHER): Payer: Self-pay | Admitting: *Deleted

## 2014-07-25 NOTE — H&P (Signed)
Frank Weber is an 64 y.o. male.    Chief Complaint: Left Shoulder Pain  HPI: Patient returns today reporting continued low back pain radiating to the buttock and left lower extremity.  The pain becomes quite severe after walking for only a few minutes.  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  He denies any bowel or bladder dysfunction and he remains on an out of work status although he is actually retired at this time.  His left shoulder is to be scheduled for rotator cuff repair and we will try to get that accomplished today.    Past Medical History  Diagnosis Date  . IBS (irritable bowel syndrome)     states slight  . Hyperlipidemia   . Migraines   . Arthritis     right knee  . Peripheral neuropathy     legs and feet  . GERD (gastroesophageal reflux disease)     no current med.  . Rotator cuff tear 07/2014    left  . Runny nose 07/20/2014    clear drainage  . Immature cataract   . Dental crowns present     also dental implants  . History of kidney stones   . History of cardiac murmur     states no problems    Past Surgical History  Procedure Laterality Date  . Knee arthroscopy w/ acl reconstruction Right   . Knee arthroscopy Right   . Rotator cuff repair Right   . Inguinal hernia repair Left 10/21/2007  . Tonsillectomy and adenoidectomy  1967  . Colonoscopy N/A 02/17/2013    Procedure: COLONOSCOPY;  Surgeon: Rogene Houston, MD;  Location: [facility];  Service: Endoscopy;  Laterality: N/A;  225    Family History  Problem Relation Age of Onset  . Colon cancer Other     Age of Onset-late 70's   Social History:  reports that he has been smoking Cigars.  He has never used smokeless tobacco. He reports that he drinks alcohol. He reports that he does not use illicit drugs.  Allergies:  Allergies  Allergen Reactions  . Sulfa Antibiotics Shortness Of Breath and Rash  . Coconut Oil Rash    No prescriptions prior to admission    No  results found for this or any previous visit (from the past 48 hour(s)). No results found.  Review of Systems  Constitutional: Positive for weight loss.  Eyes: Positive for blurred vision.  Cardiovascular: Positive for chest pain.  Gastrointestinal: Positive for heartburn, nausea, abdominal pain and diarrhea.  Musculoskeletal: Positive for myalgias and joint pain.  Neurological: Positive for dizziness.  Endo/Heme/Allergies: Bruises/bleeds easily.  Psychiatric/Behavioral: Positive for depression.    Height 6\' 3"  (1.905 m), weight 99.791 kg (220 lb). Physical Exam  Constitutional: He is oriented to person, place, and time. He appears well-developed and well-nourished.  HENT:  Head: Normocephalic and atraumatic.  Eyes: Pupils are equal, round, and reactive to light.  Neck: Normal range of motion. Neck supple.  Cardiovascular: Intact distal pulses.   Respiratory: Effort normal.  Musculoskeletal: He exhibits tenderness.  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  The patient walks with a slightly antalgic gait and does stooped forward a little bit 1 he is walking.  He continues have rotator cuff power weakness to examination of the left shoulder.  Neurological: He is alert and oriented to person, place,  and time.  Skin: Skin is warm and dry.  Psychiatric: He has a normal mood and affect. His behavior is normal. Judgment and thought content normal.     Assessment/Plan Assess:   Left shoulder rotator cuff tear, pending surgical repair.  Plan: We will have him seen by physical therapy for instruction on Williams flexion exercises as well as passive motion of the shoulder.  We will get him set up for his left shoulder rotator cuff repair.  His work note will remain out of work.  No new pain medicine was asked for or given today.  Sunjai Levandoski R 07/25/2014, 3:01  PM

## 2014-07-26 ENCOUNTER — Encounter (HOSPITAL_BASED_OUTPATIENT_CLINIC_OR_DEPARTMENT_OTHER): Payer: Self-pay | Admitting: *Deleted

## 2014-07-27 ENCOUNTER — Ambulatory Visit (HOSPITAL_BASED_OUTPATIENT_CLINIC_OR_DEPARTMENT_OTHER): Payer: Worker's Compensation | Admitting: Anesthesiology

## 2014-07-27 ENCOUNTER — Ambulatory Visit (HOSPITAL_BASED_OUTPATIENT_CLINIC_OR_DEPARTMENT_OTHER)
Admission: RE | Admit: 2014-07-27 | Discharge: 2014-07-27 | Disposition: A | Discharge status: Home / Self Care | Payer: Worker's Compensation | Source: Ambulatory Visit | Attending: Orthopedic Surgery | Admitting: Orthopedic Surgery

## 2014-07-27 ENCOUNTER — Encounter (HOSPITAL_BASED_OUTPATIENT_CLINIC_OR_DEPARTMENT_OTHER)
Admission: RE | Disposition: A | Discharge status: Home / Self Care | Payer: Self-pay | Source: Ambulatory Visit | Attending: Orthopedic Surgery

## 2014-07-27 ENCOUNTER — Encounter (HOSPITAL_BASED_OUTPATIENT_CLINIC_OR_DEPARTMENT_OTHER): Payer: Self-pay | Admitting: *Deleted

## 2014-07-27 DIAGNOSIS — M94212 Chondromalacia, left shoulder: Secondary | ICD-10-CM | POA: Insufficient documentation

## 2014-07-27 DIAGNOSIS — M4807 Spinal stenosis, lumbosacral region: Secondary | ICD-10-CM | POA: Diagnosis not present

## 2014-07-27 DIAGNOSIS — K589 Irritable bowel syndrome without diarrhea: Secondary | ICD-10-CM | POA: Insufficient documentation

## 2014-07-27 DIAGNOSIS — G629 Polyneuropathy, unspecified: Secondary | ICD-10-CM | POA: Insufficient documentation

## 2014-07-27 DIAGNOSIS — Y939 Activity, unspecified: Secondary | ICD-10-CM | POA: Diagnosis not present

## 2014-07-27 DIAGNOSIS — M25512 Pain in left shoulder: Secondary | ICD-10-CM | POA: Diagnosis present

## 2014-07-27 DIAGNOSIS — S46012A Strain of muscle(s) and tendon(s) of the rotator cuff of left shoulder, initial encounter: Secondary | ICD-10-CM | POA: Diagnosis not present

## 2014-07-27 DIAGNOSIS — M7542 Impingement syndrome of left shoulder: Secondary | ICD-10-CM | POA: Diagnosis not present

## 2014-07-27 DIAGNOSIS — Y990 Civilian activity done for income or pay: Secondary | ICD-10-CM | POA: Insufficient documentation

## 2014-07-27 DIAGNOSIS — M75102 Unspecified rotator cuff tear or rupture of left shoulder, not specified as traumatic: Secondary | ICD-10-CM

## 2014-07-27 DIAGNOSIS — M13861 Other specified arthritis, right knee: Secondary | ICD-10-CM | POA: Diagnosis not present

## 2014-07-27 DIAGNOSIS — Y929 Unspecified place or not applicable: Secondary | ICD-10-CM | POA: Diagnosis not present

## 2014-07-27 DIAGNOSIS — Z9889 Other specified postprocedural states: Secondary | ICD-10-CM | POA: Diagnosis not present

## 2014-07-27 DIAGNOSIS — Z87442 Personal history of urinary calculi: Secondary | ICD-10-CM | POA: Diagnosis not present

## 2014-07-27 DIAGNOSIS — K219 Gastro-esophageal reflux disease without esophagitis: Secondary | ICD-10-CM | POA: Insufficient documentation

## 2014-07-27 DIAGNOSIS — X58XXXA Exposure to other specified factors, initial encounter: Secondary | ICD-10-CM | POA: Insufficient documentation

## 2014-07-27 DIAGNOSIS — G43909 Migraine, unspecified, not intractable, without status migrainosus: Secondary | ICD-10-CM | POA: Insufficient documentation

## 2014-07-27 DIAGNOSIS — F17210 Nicotine dependence, cigarettes, uncomplicated: Secondary | ICD-10-CM | POA: Diagnosis not present

## 2014-07-27 DIAGNOSIS — E785 Hyperlipidemia, unspecified: Secondary | ICD-10-CM | POA: Diagnosis not present

## 2014-07-27 DIAGNOSIS — S43432A Superior glenoid labrum lesion of left shoulder, initial encounter: Secondary | ICD-10-CM | POA: Insufficient documentation

## 2014-07-27 HISTORY — DX: Unspecified osteoarthritis, unspecified site: M19.90

## 2014-07-27 HISTORY — PX: SHOULDER ARTHROSCOPY WITH OPEN ROTATOR CUFF REPAIR: SHX6092

## 2014-07-27 HISTORY — DX: Personal history of urinary calculi: Z87.442

## 2014-07-27 LAB — POCT HEMOGLOBIN-HEMACUE: HEMOGLOBIN: 17.3 g/dL — AB (ref 13.0–17.0)

## 2014-07-27 SURGERY — ARTHROSCOPY, SHOULDER WITH REPAIR, ROTATOR CUFF, OPEN
Anesthesia: General | Site: Shoulder | Laterality: Left

## 2014-07-27 MED ORDER — OXYCODONE-ACETAMINOPHEN 5-325 MG PO TABS: 1.0000 | ORAL_TABLET | ORAL | Status: DC | PRN

## 2014-07-27 MED ORDER — PROPOFOL 10 MG/ML IV BOLUS
INTRAVENOUS | Status: AC
Start: 2014-07-27 — End: 2014-07-27
  Filled 2014-07-27: qty 20

## 2014-07-27 MED ORDER — PROMETHAZINE HCL 25 MG/ML IJ SOLN: 6.2500 mg | INTRAMUSCULAR | Status: DC | PRN

## 2014-07-27 MED ORDER — METOCLOPRAMIDE HCL 5 MG PO TABS: 5.0000 mg | ORAL_TABLET | Freq: Three times a day (TID) | ORAL | Status: DC | PRN

## 2014-07-27 MED ORDER — HYDROMORPHONE HCL 1 MG/ML IJ SOLN
INTRAMUSCULAR | Status: AC
  Filled 2014-07-27: qty 1

## 2014-07-27 MED ORDER — GLYCOPYRROLATE 0.2 MG/ML IJ SOLN
INTRAMUSCULAR | Status: DC | PRN
  Administered 2014-07-27: 0.2 mg via INTRAVENOUS

## 2014-07-27 MED ORDER — FENTANYL CITRATE 0.05 MG/ML IJ SOLN
50.0000 ug | INTRAMUSCULAR | Status: DC | PRN
  Administered 2014-07-27: 50 ug via INTRAVENOUS

## 2014-07-27 MED ORDER — EPHEDRINE SULFATE 50 MG/ML IJ SOLN
INTRAMUSCULAR | Status: DC | PRN
  Administered 2014-07-27 (×3): 10 mg via INTRAVENOUS

## 2014-07-27 MED ORDER — ONDANSETRON HCL 4 MG/2ML IJ SOLN
INTRAMUSCULAR | Status: DC | PRN
  Administered 2014-07-27: 4 mg via INTRAVENOUS

## 2014-07-27 MED ORDER — ONDANSETRON HCL 4 MG PO TABS: 4.0000 mg | ORAL_TABLET | Freq: Four times a day (QID) | ORAL | Status: DC | PRN

## 2014-07-27 MED ORDER — HYDROMORPHONE HCL 1 MG/ML IJ SOLN
0.2500 mg | INTRAMUSCULAR | Status: DC | PRN
  Administered 2014-07-27 (×2): 0.5 mg via INTRAVENOUS

## 2014-07-27 MED ORDER — LACTATED RINGERS IV SOLN
INTRAVENOUS | Status: DC
  Administered 2014-07-27 (×2): via INTRAVENOUS
  Administered 2014-07-27: 10 mL/h via INTRAVENOUS

## 2014-07-27 MED ORDER — LIDOCAINE HCL (CARDIAC) 10 MG/ML IV SOLN
INTRAVENOUS | Status: DC | PRN
  Administered 2014-07-27: 100 mg via INTRAVENOUS

## 2014-07-27 MED ORDER — METOCLOPRAMIDE HCL 5 MG/ML IJ SOLN: 5.0000 mg | Freq: Three times a day (TID) | INTRAMUSCULAR | Status: DC | PRN

## 2014-07-27 MED ORDER — DEXTROSE-NACL 5-0.45 % IV SOLN: INTRAVENOUS | Status: DC

## 2014-07-27 MED ORDER — MIDAZOLAM HCL 2 MG/2ML IJ SOLN
1.0000 mg | INTRAMUSCULAR | Status: DC | PRN
  Administered 2014-07-27: 1 mg via INTRAVENOUS

## 2014-07-27 MED ORDER — MIDAZOLAM HCL 2 MG/2ML IJ SOLN
INTRAMUSCULAR | Status: AC
  Filled 2014-07-27: qty 2

## 2014-07-27 MED ORDER — CEFAZOLIN SODIUM-DEXTROSE 2-3 GM-% IV SOLR
2.0000 g | INTRAVENOUS | Status: AC
  Administered 2014-07-27: 2 g via INTRAVENOUS

## 2014-07-27 MED ORDER — EPINEPHRINE HCL 1 MG/ML IJ SOLN
INTRAMUSCULAR | Status: AC
Start: 2014-07-27 — End: 2014-07-27
  Filled 2014-07-27: qty 1

## 2014-07-27 MED ORDER — FENTANYL CITRATE 0.05 MG/ML IJ SOLN
INTRAMUSCULAR | Status: AC
  Filled 2014-07-27: qty 2

## 2014-07-27 MED ORDER — PROPOFOL 10 MG/ML IV BOLUS
INTRAVENOUS | Status: DC | PRN
  Administered 2014-07-27: 200 mg via INTRAVENOUS

## 2014-07-27 MED ORDER — CEFAZOLIN SODIUM-DEXTROSE 2-3 GM-% IV SOLR
INTRAVENOUS | Status: AC
  Filled 2014-07-27: qty 50

## 2014-07-27 MED ORDER — FENTANYL CITRATE 0.05 MG/ML IJ SOLN
INTRAMUSCULAR | Status: DC | PRN
  Administered 2014-07-27 (×2): 50 ug via INTRAVENOUS

## 2014-07-27 MED ORDER — BUPIVACAINE-EPINEPHRINE (PF) 0.5% -1:200000 IJ SOLN
INTRAMUSCULAR | Status: AC
Start: 2014-07-27 — End: 2014-07-27
  Filled 2014-07-27: qty 30

## 2014-07-27 MED ORDER — ONDANSETRON HCL 4 MG/2ML IJ SOLN: 4.0000 mg | Freq: Four times a day (QID) | INTRAMUSCULAR | Status: DC | PRN

## 2014-07-27 MED ORDER — CHLORHEXIDINE GLUCONATE 4 % EX LIQD: 60.0000 mL | Freq: Once | CUTANEOUS | Status: DC

## 2014-07-27 MED ORDER — FENTANYL CITRATE 0.05 MG/ML IJ SOLN
INTRAMUSCULAR | Status: AC
Start: 2014-07-27 — End: 2014-07-27
  Filled 2014-07-27: qty 6

## 2014-07-27 MED ORDER — SUCCINYLCHOLINE CHLORIDE 20 MG/ML IJ SOLN
INTRAMUSCULAR | Status: DC | PRN
  Administered 2014-07-27: 100 mg via INTRAVENOUS

## 2014-07-27 MED ORDER — SODIUM CHLORIDE 0.9 % IR SOLN
Status: DC | PRN
  Administered 2014-07-27: 2

## 2014-07-27 MED ORDER — MIDAZOLAM HCL 5 MG/5ML IJ SOLN
INTRAMUSCULAR | Status: DC | PRN
  Administered 2014-07-27: 2 mg via INTRAVENOUS

## 2014-07-27 MED ORDER — EPINEPHRINE HCL 1 MG/ML IJ SOLN
INTRAMUSCULAR | Status: DC | PRN
  Administered 2014-07-27: 2 mg

## 2014-07-27 MED ORDER — DEXAMETHASONE SODIUM PHOSPHATE 4 MG/ML IJ SOLN
INTRAMUSCULAR | Status: DC | PRN
  Administered 2014-07-27: 10 mg via INTRAVENOUS

## 2014-07-27 SURGICAL SUPPLY — 60 items
ANCHOR ALLTHRD KNTLS PEEK 5.5 (Anchor) ×4 IMPLANT
ANCHOR PEEK ALL THREAD (Anchor) ×2 IMPLANT
BLADE CUTTER GATOR 3.5 (BLADE) ×2 IMPLANT
BLADE GREAT WHITE 4.2 (BLADE) ×2 IMPLANT
BLADE GREAT WHITE 4.2MM (BLADE) ×1
BLADE SURG 15 STRL LF DISP TIS (BLADE) IMPLANT
BLADE SURG 15 STRL SS (BLADE) ×3
BUR VERTEX HOODED 4.5 (BURR) ×3 IMPLANT
CANNULA 5.75X71 LONG (CANNULA) IMPLANT
CANNULA TWIST IN 8.25X7CM (CANNULA) IMPLANT
DECANTER SPIKE VIAL GLASS SM (MISCELLANEOUS) IMPLANT
DRAPE INCISE IOBAN 66X45 STRL (DRAPES) IMPLANT
DRAPE SHOULDER BEACH CHAIR (DRAPES) ×3 IMPLANT
DURAPREP 26ML APPLICATOR (WOUND CARE) ×3 IMPLANT
ELECT REM PT RETURN 9FT ADLT (ELECTROSURGICAL) ×3
ELECTRODE REM PT RTRN 9FT ADLT (ELECTROSURGICAL) ×1 IMPLANT
GAUZE SPONGE 4X4 12PLY STRL (GAUZE/BANDAGES/DRESSINGS) ×3 IMPLANT
GAUZE XEROFORM 1X8 LF (GAUZE/BANDAGES/DRESSINGS) ×3 IMPLANT
GLOVE BIO SURGEON STRL SZ7.5 (GLOVE) ×3 IMPLANT
GLOVE BIO SURGEON STRL SZ8.5 (GLOVE) ×3 IMPLANT
GLOVE BIOGEL M STRL SZ7.5 (GLOVE) ×2 IMPLANT
GLOVE BIOGEL PI IND STRL 8 (GLOVE) ×1 IMPLANT
GLOVE BIOGEL PI IND STRL 9 (GLOVE) ×1 IMPLANT
GLOVE BIOGEL PI INDICATOR 8 (GLOVE) ×4
GLOVE BIOGEL PI INDICATOR 9 (GLOVE) ×2
GOWN PREVENTION PLUS XLARGE (GOWN DISPOSABLE) ×2 IMPLANT
GOWN STRL REUS W/ TWL LRG LVL3 (GOWN DISPOSABLE) ×1 IMPLANT
GOWN STRL REUS W/ TWL LRG LVL4 (GOWN DISPOSABLE) ×1 IMPLANT
GOWN STRL REUS W/TWL LRG LVL3 (GOWN DISPOSABLE) ×3
GOWN STRL REUS W/TWL LRG LVL4 (GOWN DISPOSABLE) ×3
IV NS IRRIG 3000ML ARTHROMATIC (IV SOLUTION) ×4 IMPLANT
MANIFOLD NEPTUNE II (INSTRUMENTS) ×3 IMPLANT
NDL SAFETY ECLIPSE 18X1.5 (NEEDLE) ×1 IMPLANT
NDL SUT 6 .5 CRC .975X.05 MAYO (NEEDLE) IMPLANT
NEEDLE HYPO 18GX1.5 SHARP (NEEDLE) ×3
NEEDLE MAYO TAPER (NEEDLE)
NS IRRIG 1000ML POUR BTL (IV SOLUTION) IMPLANT
PACK ARTHROSCOPY DSU (CUSTOM PROCEDURE TRAY) ×3 IMPLANT
PACK BASIN DAY SURGERY FS (CUSTOM PROCEDURE TRAY) ×3 IMPLANT
PENCIL BUTTON HOLSTER BLD 10FT (ELECTRODE) ×2 IMPLANT
SET IRRIG Y TYPE TUR BLADDER L (SET/KITS/TRAYS/PACK) ×3 IMPLANT
SLEEVE SCD COMPRESS KNEE MED (MISCELLANEOUS) ×2 IMPLANT
SLING ARM IMMOBILIZER LRG (SOFTGOODS) IMPLANT
SLING ARM LRG ADULT FOAM STRAP (SOFTGOODS) IMPLANT
SLING ARM MED ADULT FOAM STRAP (SOFTGOODS) IMPLANT
SPONGE LAP 4X18 X RAY DECT (DISPOSABLE) ×2 IMPLANT
SUCTION FRAZIER TIP 10 FR DISP (SUCTIONS) ×2 IMPLANT
SUT ETHIBOND 2 OS 4 DA (SUTURE) IMPLANT
SUT ETHILON 4 0 PS 2 18 (SUTURE) IMPLANT
SUT MNCRL AB 4-0 PS2 18 (SUTURE) IMPLANT
SUT VIC AB 3-0 PS1 18 (SUTURE) ×3
SUT VIC AB 3-0 PS1 18XBRD (SUTURE) IMPLANT
SYR 5ML LL (SYRINGE) ×3 IMPLANT
SYR TB 1ML LL NO SAFETY (SYRINGE) ×2 IMPLANT
TAPE PAPER 3X10 WHT MICROPORE (GAUZE/BANDAGES/DRESSINGS) ×3 IMPLANT
TOWEL OR 17X24 6PK STRL BLUE (TOWEL DISPOSABLE) ×3 IMPLANT
TUBE CONNECTING 20'X1/4 (TUBING) ×1
TUBE CONNECTING 20X1/4 (TUBING) ×1 IMPLANT
WAND STAR VAC 90 (SURGICAL WAND) ×3 IMPLANT
WATER STERILE IRR 1000ML POUR (IV SOLUTION) ×3 IMPLANT

## 2014-07-27 NOTE — Anesthesia Procedure Notes (Signed)
Procedure Name: Intubation Date/Time: 07/27/2014 11:54 AM Performed by: Lyndee Leo Pre-anesthesia Checklist: Patient identified, Emergency Drugs available, Suction available and Patient being monitored Patient Re-evaluated:Patient Re-evaluated prior to inductionOxygen Delivery Method: Circle System Utilized Preoxygenation: Pre-oxygenation with 100% oxygen Intubation Type: IV induction Ventilation: Mask ventilation without difficulty Laryngoscope Size: Mac and 4 Grade View: Grade II Tube type: Oral Tube size: 8.0 mm Number of attempts: 1 Airway Equipment and Method: Stylet and Oral airway Placement Confirmation: ETT inserted through vocal cords under direct vision,  positive ETCO2 and breath sounds checked- equal and bilateral Secured at: 22 cm Tube secured with: Tape Dental Injury: Teeth and Oropharynx as per pre-operative assessment

## 2014-07-27 NOTE — Discharge Instructions (Signed)
Surgery for Rotator Cuff Tear with Rehab Rotator cuff surgery is only recommended for individuals who have experienced persistent disability for greater than 3 months of non-surgical (conservative) treatment. Surgery is not necessary but is recommended for individuals who experience difficulty completing daily activities or athletes who are unable to compete. Rotator cuff tears do not usually heal without surgical intervention. If left alone small rotator cuff tears usually become larger. Younger athletes who have a rotator cuff tear may be recommended for surgery without attempting conservative rehabilitation. The purpose of surgery is to regain function of the shoulder joint and eliminate pain associated with the injury. In addition to repairing the tendon tear, the surgery often removes a portion of the bony roof of the shoulder (acromion) as well as the chronically thickened and inflamed membrane below the acromion (subacromial bursa). REASONS NOT TO OPERATE   Infection of the shoulder.  Inability to complete a rehabilitation program.  Patients who have other conditions (emotional or psychological) conditions that contribute to their shoulder condition. RISKS AND COMPLICATIONS  Infection.  Re-tear of the rotator cuff tendons or muscles.  Shoulder stiffness and/or weakness.  Inability to compete in athletics.  Acromioclavicular (AC) joint paint.  Risks of surgery: infection, bleeding, nerve damage, or damage to surrounding tissues. TECHNIQUE There are different surgical procedures used to treat rotator cuff tears. The type of procedure depends on the extent of injury as well as the surgeon's preference. All of the surgical techniques for rotator cuff tears have the same goal of repairing the torn tendon, removing part of the acromion, and removing the subacromial bursa. There are two main types of procedures: arthroscopic and open incision. Arthroscopic procedures are usually completed  and you go home the same day as surgery (outpatient). These procedures use multiple small incisions in which tools and a video camera are placed to work on the shoulder. An electric shaver removes the bursa, then a power burr shaves down the portion of the acromion that places pressure on the rotator cuff. Finally the rotator cuff is sewed (sutured) back to the humeral head. Open incision procedures require a larger incision. The deltoid muscle is detached from the acromion and a ligament in the shoulder (coracoacromial) is cut in order for the surgeon to access the rotator cuff. The subacromial bursa is removed as well as part of the acromion to give the rotator cuff room to move freely. The torn tendon is then sutured to the humeral head. After the rotator cuff is repaired, then the deltoid is reattached and the incision is closed up.  RECOVERY   Post-operative care depends on the surgical technique and the preferences of your therapist.  Keep the wound clean and dry for the first 10 to 14 days after surgery.  Keep your shoulder and arm in the sling provided to you for as long as you have been instructed to.  You will be given pain medications by your caregiver.  Passive (without using muscles) shoulder movements may be begun immediately after surgery.  It is important to follow through with you rehabilitation program in order to have the best possible recovery. RETURN TO SPORTS   The rehabilitation period will depend on the sport and position you play as well as the success of the operation.  The minimum recovery period is 6 months.  You must have regained complete shoulder motion and strength before returning to sports. SEEK IMMEDIATE MEDICAL CARE IF:   Any medications produce adverse side effects.  Any complications from surgery  occur:  Pain, numbness, or coldness in the extremity operated upon.  Discoloration of the nail beds (they become blue or gray) of the extremity operated  upon.  Signs of infections (fever, pain, inflammation, redness, or persistent bleeding). EXERCISES  RANGE OF MOTION (ROM) AND STRETCHING EXERCISES - Rotator Cuff Tear, Surgery For These exercises may help you restore your elbow mobility once your physician has discontinued your immobilization period. Beginning these before your provider's approval may result in delayed healing. Your symptoms may resolve with or without further involvement from your physician, physical therapist or athletic trainer. While completing these exercises, remember:   Restoring tissue flexibility helps normal motion to return to the joints. This allows healthier, less painful movement and activity.  An effective stretch should be held for at least 30 seconds. A stretch should never be painful. You should only feel a gentle lengthening or release in the stretch. ROM - Pendulum   Bend at the waist so that your right / left arm falls away from your body. Support yourself with your opposite hand on a solid surface, such as a table or a countertop.  Your right / left arm should be perpendicular to the ground. If it is not perpendicular, you need to lean over farther. Relax the muscles in your right / left arm and shoulder as much as possible.  Gently sway your hips and trunk so they move your right / left arm without any use of your right / left shoulder muscles.  Progress your movements so that your right / left arm moves side to side, then forward and backward, and finally, both clockwise and counterclockwise.  Complete __________ repetitions in each direction. Many people use this exercise to relieve discomfort in their shoulder as well as to gain range of motion. Repeat __________ times. Complete this exercise __________ times per day. STRETCH - Flexion, Seated   Sit in a firm chair so that your right / left forearm can rest on a table or on a table or countertop. Your right / left elbow should rest below the height  of your shoulder so that your shoulder feels supported and not tense or uncomfortable.  Keeping your right / left shoulder relaxed, lean forward at your waist, allowing your right / left hand to slide forward. Bend forward until you feel a moderate stretch in your shoulder, but before you feel an increase in your pain.  Hold __________ seconds. Slowly return to your starting position. Repeat __________ times. Complete this exercise __________ times per day.  STRETCH - Flexion, Standing   Stand with good posture. With an underhand grip on your right / left and an overhand grip on the opposite hand, grasp a broomstick or cane so that your hands are a little more than shoulder-width apart.  Keeping your right / left elbow straight and shoulder muscles relaxed, push the stick with your opposite hand to raise your right / left arm in front of your body and then overhead. Raise your arm until you feel a stretch in your right / left shoulder, but before you have increased shoulder pain.  Try to avoid shrugging your right / left shoulder as your arm rises by keeping your shoulder blade tucked down and toward your mid-back spine. Hold __________ seconds.  Slowly return to the starting position. Repeat __________ times. Complete this exercise __________ times per day.  STRETCH - Abduction, Supine   Stand with good posture. With an underhand grip on your right / left and an  overhand grip on the opposite hand, grasp a broomstick or cane so that your hands are a little more than shoulder-width apart.  Keeping your right / left elbow straight and shoulder muscles relaxed, push the stick with your opposite hand to raise your right / left arm out to the side of your body and then overhead. Raise your arm until you feel a stretch in your right / left shoulder, but before you have increased shoulder pain.  Try to avoid shrugging your right / left shoulder as your arm rises by keeping your shoulder blade tucked  down and toward your mid-back spine. Hold __________ seconds.  Slowly return to the starting position. Repeat __________ times. Complete this exercise __________ times per day.  ROM - Flexion, Active-Assisted  Lie on your back. You may bend your knees for comfort.  Grasp a broomstick or cane so your hands are about shoulder-width apart. Your right / left hand should grip the end of the stick/cane so that your hand is positioned "thumbs-up," as if you were about to shake hands.  Using your healthy arm to lead, raise your right / left arm overhead until you feel a gentle stretch in your shoulder. Hold __________ seconds.  Use the stick/cane to assist in returning your right / left arm to its starting position. Repeat __________ times. Complete this exercise __________ times per day.  STRETCH - External Rotation   Tuck a folded towel or small ball under your right / left upper arm. Grasp a broomstick or cane with an underhand grasp a little more than shoulder width apart. Bend your elbows to 90 degrees.  Stand with good posture or sit in a chair without arms.  Use your strong arm to push the stick across your body. Do not allow the towel or ball to fall. This will rotate your right / left arm away from your abdomen. Using the stick turn/rotate your hand and forearm away from your body. Hold __________ seconds. Repeat __________ times. Complete this exercise __________ times per day.  STRENGTHENING EXERCISES - Rotator Cuff Tear, Surgery For These exercises may help you begin to restore your elbow strength in the initial stage of your rehabilitation. Your physician will determine when you begin these exercises depending on the severity of your injury and the integrity of your repaired tissues. Beginning these before your provider's approval may result in delayed healing. While completing these exercises, remember:   Muscles can gain both the endurance and the strength needed for everyday  activities through controlled exercises.  Complete these exercises as instructed by your physician, physical therapist or athletic trainer. Progress the resistance and repetitions only as guided.  You may experience muscle soreness or fatigue, but the pain or discomfort you are trying to eliminate should never worsen during these exercises. If this pain does worsen, stop and make certain you are following the directions exactly. If the pain is still present after adjustments, discontinue the exercise until you can discuss the trouble with your clinician. STRENGTH - Shoulder Flexion, Isometric   With good posture and facing a wall, stand or sit about 4-6 inches away.  Keeping your right / left elbow straight, gently press the top of your fist into the wall. Increase the pressure gradually until you are pressing as hard as you can without shrugging your shoulder or increasing any shoulder discomfort.  Hold __________ seconds. Release the tension slowly. Relax your shoulder muscles completely before you do the next repetition. Repeat __________ times. Complete this  exercise __________ times per day.  STRENGTH - Shoulder Abductors, Isometric   With good posture, stand or sit about 4-6 inches from a wall with your right / left side facing the wall.  Bend your right / left elbow. Gently press your right / left elbow into the wall. Increase the pressure gradually until you are pressing as hard as you can without shrugging your shoulder or increasing any shoulder discomfort.  Hold __________ seconds.  Release the tension slowly. Relax your shoulder muscles completely before you do the next repetition. Repeat __________ times. Complete this exercise __________ times per day.  STRENGTH - Internal Rotators, Isometric   Keep your right / left elbow at your side and bend it 90 degrees.  Step into a door frame so that the inside of your right / left wrist can press against the door frame without your  upper arm leaving your side.  Gently press your right / left wrist into the door frame as if you were trying to draw the palm of your hand to your abdomen. Gradually increase the tension until you are pressing as hard as you can without shrugging your shoulder or increasing any shoulder discomfort.  Hold __________ seconds.  Release the tension slowly. Relax your shoulder muscles completely before you do the next repetition. Repeat __________ times. Complete this exercise __________ times per day.  STRENGTH - External Rotators, Isometric   Keep your right / left elbow at your side and bend it 90 degrees.  Step into a door frame so that the outside of your right / left wrist can press against the door frame without your upper arm leaving your side.  Gently press your right / left wrist into the door frame as if you were trying to swing the back of your hand away from your abdomen. Gradually increase the tension until you are pressing as hard as you can without shrugging your shoulder or increasing any shoulder discomfort.  Hold __________ seconds.  Release the tension slowly. Relax your shoulder muscles completely before you do the next repetition. Repeat __________ times. Complete this exercise __________ times per day.  Document Released: 05/27/2005 Document Revised: 08/19/2011 Document Reviewed: 09/08/2008 [facility] Patient Information 2015 [place], [place]. This information is not intended to replace advice given to you by your health care provider. Make sure you discuss any questions you have with your health care provider.   Regional Anesthesia Blocks  1. Numbness or the inability to move the "blocked" extremity may last from 3-48 hours after placement. The length of time depends on the medication injected and your individual response to the medication. If the numbness is not going away after 48 hours, call your surgeon.  2. The extremity that is blocked will need to be protected until  the numbness is gone and the  Strength has returned. Because you cannot feel it, you will need to take extra care to avoid injury. Because it may be weak, you may have difficulty moving it or using it. You may not know what position it is in without looking at it while the block is in effect.  3. For blocks in the legs and feet, returning to weight bearing and walking needs to be done carefully. You will need to wait until the numbness is entirely gone and the strength has returned. You should be able to move your leg and foot normally before you try and bear weight or walk. You will need someone to be with you when you first  try to ensure you do not fall and possibly risk injury.  4. Bruising and tenderness at the needle site are common side effects and will resolve in a few days.  5. Persistent numbness or new problems with movement should be communicated to the surgeon or the [place] [id number] [place] [phone]).     Post Anesthesia Home Care Instructions  Activity: Get plenty of rest for the remainder of the day. A responsible adult should stay with you for 24 hours following the procedure.  For the next 24 hours, DO NOT: -Drive a car -Paediatric nurse -Drink alcoholic beverages -Take any medication unless instructed by your physician -Make any legal decisions or sign important papers.  Meals: Start with liquid foods such as gelatin or soup. Progress to regular foods as tolerated. Avoid greasy, spicy, heavy foods. If nausea and/or vomiting occur, drink only clear liquids until the nausea and/or vomiting subsides. Call your physician if vomiting continues.  Special Instructions/Symptoms: Your throat may feel dry or sore from the anesthesia or the breathing tube placed in your throat during surgery. If this causes discomfort, gargle with warm salt water. The discomfort should disappear within 24 hours.

## 2014-07-27 NOTE — Transfer of Care (Signed)
Immediate Anesthesia Transfer of Care Note  Patient: Frank Weber  Procedure(s) Performed: Procedure(s): LEFT SHOULDER ARTHROSCOPY WITH MINI OPEN ROTATOR CUFF REPAIR (Left)  Patient Location: PACU  Anesthesia Type:GA combined with regional for post-op pain  Level of Consciousness: awake, sedated and patient cooperative  Airway & Oxygen Therapy: Patient Spontanous Breathing and Patient connected to face mask oxygen  Post-op Assessment: Report given to RN and Post -op Vital signs reviewed and stable  Post vital signs: Reviewed and stable  Last Vitals:  Filed Vitals:   07/27/14 1110  BP: 114/63  Pulse: 53  Temp:   Resp: 13    Complications: No apparent anesthesia complications

## 2014-07-27 NOTE — Op Note (Signed)
Preoperative diagnosis: Left shoulder impingement syndrome, supraspinatus insertional rotator cuff tear Postoperative diagnosis: Same   Procedure: Left shoulder arthroscopic anterior-inferior acromioplasty, debridement of supraspinatus rotator cuff tear and small labral tear, mini open rotator cuff repair using 1 medial Biomet triple threaded anchor and 2 knotless all threaded anchors laterally Surgeon: Kathalene Frames. Mayer Camel M.D.   First assistant: Leighton Parody PA-C, was present for entire procedure, was needed for retraction, operation of arthroscopic equipment, placement of dressing.  Anesthetic: Left shoulder interscalene block plus general endotracheal   Estimated blood loss: Minimal   Fluid replacement: 1200 cc of crystalloid.   Indications for procedure: Patient with shoulder impingement syndrome and MRI proven supraspinatus insertional rotator cuff tear, secondary to injury at work. Because of increasing pain and weakness patient desires elective arthroscopic evaluation, probable acromioplasty and debridement of torn cartilage labrum and rotator cuff followed by mini open rotator cuff repair.. Risks and benefits of surgery were discussed prior to the procedure and all questions answered.   Description of procedure: Patient was identified by arm band and given preoperative IV antibiotics in the holding area, as well as interscalene block anesthetic.Marland Kitchen Patient was taken to the operating room where the appropriate anesthetic monitors were attached and general endotracheal anesthesia was induced with the patient in the supine position. Patient was then placed in the beachchair position and the L upper extremity prepped and draped in the usual sterile fashion from the wrist to the hemithorax. A time out procedure was performed. We began the operation by making standard portals 1.5 cm anterior to the acromion, 1.5 cm lateral to the junction of the middle and posterior thirds of the acromion, and 1.5 cm  posterior the posterior lateral corner of the acromion process. The inflow with gravity was placed anteriorly, the arthroscope laterally, and a 4.2 great-white sucker shaver posteriorly. The subacromial bursa was resected and we visualized the subacromial spur which was then removed with a 4.5 hooded vortex bur making 2 passes. We then evaluated the rotator cuff and found a 1/2 cm wide by 1 cm retracted supraspinatus cuff tear that was debrided back to a stable margin and was deathly repairable. The before meals joint was relatively clean and this was left alone. The arthroscope was then inserted into the glenohumeral joint using the posterior portal were we lightly debrided the labrum the articular cartilage of the humerus was in good condition there is some grade 2 chondromalacia of the glenoid again lightly debrided. The biceps anchor and biceps tendon were intact the insertion of the infraspinatus and subscapularis was intact the insertion of the supraspinatus was attenuated as it approached the biceps groove, and we did visualize the internal side of the rotator cuff tear. At this point the shoulder was irrigated out normal saline solution the arthroscopic instruments removed and we prepared for the mini open rotator cuff repair. A 3 cm incision starting at the anterolateral corner of the acromion was made through the skin and subcutaneous tissue going diagonally along the fibers of the deltoid which were split taking [place] down to the subdeltoid interval and we visualized the rotator cuff tear. Medially we placed a single triple threaded Biomet all threaded anchor passed all 6 sutures through the cuff tear and tied in pairs. We then performed a suture mattress repair using 2 lateral knotless all threaded anchors obtaining a watertight repair. The wound was irrigated out normal saline solution and closed in layers with 3-0 Vicryl subcutaneous and subcuticular suture. A dressing of Xerofoam 4  x 4 dressing sponges,  paper tape, and sling applied the patient was then placed in supine awakened extubated and taken to the recovery without difficulty.

## 2014-07-27 NOTE — Progress Notes (Signed)
Assisted Dr. Orene Desanctis with left, ultrasound guided, interscalene  block. Side rails up, monitors on throughout procedure. See vital signs in flow sheet. Tolerated Procedure well.

## 2014-07-27 NOTE — Anesthesia Postprocedure Evaluation (Signed)
  Anesthesia Post-op Note  Patient: Frank Weber  Procedure(s) Performed: Procedure(s): LEFT SHOULDER ARTHROSCOPY WITH MINI OPEN ROTATOR CUFF REPAIR (Left)  Patient Location: PACU  Anesthesia Type:GA combined with regional for post-op pain  Level of Consciousness: awake and alert   Airway and Oxygen Therapy: Patient Spontanous Breathing  Post-op Pain: none  Post-op Assessment: Post-op Vital signs reviewed  Post-op Vital Signs: stable  Last Vitals:  Filed Vitals:   07/27/14 1430  BP: 105/59  Pulse: 85  Temp:   Resp: 18    Complications: No apparent anesthesia complications

## 2014-07-27 NOTE — Interval H&P Note (Signed)
History and Physical Interval Note:  07/27/2014 11:35 AM  Frank Weber  has presented today for surgery, with the diagnosis of left shoulder rotator cuff tear  The various methods of treatment have been discussed with the patient and family. After consideration of risks, benefits and other options for treatment, the patient has consented to  Procedure(s): LEFT SHOULDER ARTHROSCOPY WITH MINI OPEN ROTATOR CUFF REPAIR (Left) as a surgical intervention .  The patient's history has been reviewed, patient examined, no change in status, stable for surgery.  I have reviewed the patient's chart and labs.  Questions were answered to the patient's satisfaction.     [name]

## 2014-07-27 NOTE — Anesthesia Preprocedure Evaluation (Signed)
Anesthesia Evaluation  Patient identified by MRN, date of birth, ID band Patient awake    Reviewed: Allergy & Precautions, NPO status , Patient's Chart, lab work & pertinent test results  Airway Mallampati: I  TM Distance: >3 FB Neck ROM: Full    Dental  (+) Teeth Intact   Pulmonary Current Smoker,  breath sounds clear to auscultation        Cardiovascular Rhythm:Regular Rate:Normal     Neuro/Psych  Headaches,  Neuromuscular disease    GI/Hepatic GERD-  ,  Endo/Other    Renal/GU      Musculoskeletal  (+) Arthritis -,   Abdominal   Peds  Hematology   Anesthesia Other Findings   Reproductive/Obstetrics                             Anesthesia Physical Anesthesia Plan  ASA: II  Anesthesia Plan: General   Post-op Pain Management:    Induction: Intravenous  Airway Management Planned: Oral ETT and LMA  Additional Equipment:   Intra-op Plan:   Post-operative Plan: Extubation in OR  Informed Consent: I have reviewed the patients History and Physical, chart, labs and discussed the procedure including the risks, benefits and alternatives for the proposed anesthesia with the patient or authorized representative who has indicated his/her understanding and acceptance.   Dental advisory given  Plan Discussed with: CRNA and Surgeon  Anesthesia Plan Comments:         Anesthesia Quick Evaluation

## 2014-07-28 ENCOUNTER — Encounter (HOSPITAL_BASED_OUTPATIENT_CLINIC_OR_DEPARTMENT_OTHER): Payer: Self-pay | Admitting: Orthopedic Surgery

## 2014-07-31 ENCOUNTER — Encounter (HOSPITAL_COMMUNITY): Payer: Self-pay

## 2014-07-31 ENCOUNTER — Emergency Department (HOSPITAL_COMMUNITY)
Admission: EM | Admit: 2014-07-31 | Discharge: 2014-08-01 | Disposition: A | Discharge status: Home / Self Care | Payer: PRIVATE HEALTH INSURANCE | Attending: Emergency Medicine | Admitting: Emergency Medicine

## 2014-07-31 DIAGNOSIS — Z9889 Other specified postprocedural states: Secondary | ICD-10-CM | POA: Diagnosis not present

## 2014-07-31 DIAGNOSIS — G43909 Migraine, unspecified, not intractable, without status migrainosus: Secondary | ICD-10-CM | POA: Insufficient documentation

## 2014-07-31 DIAGNOSIS — Z79899 Other long term (current) drug therapy: Secondary | ICD-10-CM | POA: Insufficient documentation

## 2014-07-31 DIAGNOSIS — Z87442 Personal history of urinary calculi: Secondary | ICD-10-CM | POA: Diagnosis not present

## 2014-07-31 DIAGNOSIS — K219 Gastro-esophageal reflux disease without esophagitis: Secondary | ICD-10-CM | POA: Diagnosis not present

## 2014-07-31 DIAGNOSIS — M1990 Unspecified osteoarthritis, unspecified site: Secondary | ICD-10-CM | POA: Diagnosis not present

## 2014-07-31 DIAGNOSIS — R011 Cardiac murmur, unspecified: Secondary | ICD-10-CM | POA: Diagnosis not present

## 2014-07-31 DIAGNOSIS — E785 Hyperlipidemia, unspecified: Secondary | ICD-10-CM | POA: Insufficient documentation

## 2014-07-31 DIAGNOSIS — G629 Polyneuropathy, unspecified: Secondary | ICD-10-CM | POA: Diagnosis not present

## 2014-07-31 DIAGNOSIS — Z720 Tobacco use: Secondary | ICD-10-CM | POA: Insufficient documentation

## 2014-07-31 DIAGNOSIS — R079 Chest pain, unspecified: Secondary | ICD-10-CM | POA: Diagnosis present

## 2014-07-31 NOTE — ED Notes (Signed)
Pt reports mid sternal chest pressure and tightness that started this afternoon,  States pain radiates into left chest and bilat jaw.  Pt is also sob.

## 2014-08-01 ENCOUNTER — Emergency Department (HOSPITAL_COMMUNITY): Payer: PRIVATE HEALTH INSURANCE

## 2014-08-01 LAB — CBC WITH DIFFERENTIAL/PLATELET
BASOS ABS: 0 10*3/uL (ref 0.0–0.1)
BASOS PCT: 0 % (ref 0–1)
EOS PCT: 3 % (ref 0–5)
Eosinophils Absolute: 0.2 10*3/uL (ref 0.0–0.7)
HCT: 49 % (ref 39.0–52.0)
HEMOGLOBIN: 17.2 g/dL — AB (ref 13.0–17.0)
Lymphocytes Relative: 21 % (ref 12–46)
Lymphs Abs: 1.2 10*3/uL (ref 0.7–4.0)
MCH: 33 pg (ref 26.0–34.0)
MCHC: 35.1 g/dL (ref 30.0–36.0)
MCV: 93.9 fL (ref 78.0–100.0)
MONO ABS: 0.5 10*3/uL (ref 0.1–1.0)
Monocytes Relative: 9 % (ref 3–12)
Neutro Abs: 3.7 10*3/uL (ref 1.7–7.7)
Neutrophils Relative %: 67 % (ref 43–77)
PLATELETS: 198 10*3/uL (ref 150–400)
RBC: 5.22 MIL/uL (ref 4.22–5.81)
RDW: 13.3 % (ref 11.5–15.5)
WBC: 5.5 10*3/uL (ref 4.0–10.5)

## 2014-08-01 LAB — COMPREHENSIVE METABOLIC PANEL
ALT: 17 U/L (ref 0–53)
ANION GAP: 6 (ref 5–15)
AST: 23 U/L (ref 0–37)
Albumin: 4.1 g/dL (ref 3.5–5.2)
Alkaline Phosphatase: 75 U/L (ref 39–117)
BILIRUBIN TOTAL: 0.7 mg/dL (ref 0.3–1.2)
BUN: 12 mg/dL (ref 6–23)
CO2: 24 mmol/L (ref 19–32)
Calcium: 9.6 mg/dL (ref 8.4–10.5)
Chloride: 109 mmol/L (ref 96–112)
Creatinine, Ser: 1.15 mg/dL (ref 0.50–1.35)
GFR calc Af Amer: 77 mL/min — ABNORMAL LOW (ref >90)
GFR calc non Af Amer: 66 mL/min — ABNORMAL LOW (ref >90)
GLUCOSE: 117 mg/dL — AB (ref 70–99)
Potassium: 3.6 mmol/L (ref 3.5–5.1)
Sodium: 139 mmol/L (ref 135–145)
Total Protein: 7.1 g/dL (ref 6.0–8.3)

## 2014-08-01 LAB — TROPONIN I
Troponin I: <0.03 ng/mL (ref <0.031)
Troponin I: <0.03 ng/mL (ref <0.031)

## 2014-08-01 LAB — I-STAT TROPONIN, ED: TROPONIN I, POC: 0 ng/mL (ref 0.00–0.08)

## 2014-08-01 MED ORDER — OMEPRAZOLE 40 MG PO CPDR: 40.0000 mg | DELAYED_RELEASE_CAPSULE | Freq: Every day | ORAL | Status: DC

## 2014-08-01 MED ORDER — PANTOPRAZOLE SODIUM 40 MG IV SOLR
40.0000 mg | Freq: Once | INTRAVENOUS | Status: AC
  Administered 2014-08-01: 40 mg via INTRAVENOUS
  Filled 2014-08-01: qty 40

## 2014-08-01 MED ORDER — GI COCKTAIL ~~LOC~~
30.0000 mL | Freq: Once | ORAL | Status: AC
  Administered 2014-08-01: 30 mL via ORAL
  Filled 2014-08-01: qty 30

## 2014-08-01 NOTE — ED Notes (Signed)
Pt remains on cardiac monitor w/ NIBP vital signs WNL. NAD noted. pt resting calmly w/ eyes closed. Rise & fall of the chest noted. Family at bedside. Bed in low position, side rails up x2. NAD noted at this time.

## 2014-08-01 NOTE — ED Provider Notes (Addendum)
CSN: [id number]     Arrival date & time 07/31/14  2345 History  This chart was scribed for Wynetta Fines, MD by Randa Evens, ED Scribe. This patient was seen in room APA02/APA02 and the patient's care was started at 12:31 AM.      Chief Complaint  Patient presents with  . Chest Pain   Patient is a 64 y.o. male presenting with chest pain.  Chest Pain  HPI Comments: Frank Weber is a 64 y.o. male who presents to the Emergency Department complaining of chest pain onset 3-4 PM today which has subsequently worsened. Pt rates the severity of his pain 7/10. Pt states that the pain radiates into his upper chest and neck. Symptoms are waxing and waning. He has had associated nausea and equivocal SOB but no diaphoresis. The pain feels like indigestion but he has tried Tums with no relief. Pt has had recent rotator cuff surgery February 17.   Past Medical History  Diagnosis Date  . IBS (irritable bowel syndrome)     states slight  . Hyperlipidemia   . Migraines   . Arthritis     right knee  . Peripheral neuropathy     legs and feet  . GERD (gastroesophageal reflux disease)     no current med.  . Rotator cuff tear 07/2014    left  . Runny nose 07/20/2014    clear drainage  . Immature cataract   . Dental crowns present     also dental implants  . History of kidney stones   . History of cardiac murmur     states no problems; no cardiac testing since 2009   Past Surgical History  Procedure Laterality Date  . Knee arthroscopy w/ acl reconstruction Right   . Knee arthroscopy Right   . Rotator cuff repair Right   . Inguinal hernia repair Left 10/21/2007  . Tonsillectomy and adenoidectomy  1967  . Colonoscopy N/A 02/17/2013    Procedure: COLONOSCOPY;  Surgeon: Rogene Houston, MD;  Location: [facility];  Service: Endoscopy;  Laterality: N/A;  225  . Cardiac catheterization  06/2007  . Shoulder arthroscopy with open rotator cuff repair Left 07/27/2014    Procedure: LEFT SHOULDER  ARTHROSCOPY WITH MINI OPEN ROTATOR CUFF REPAIR;  Surgeon: [name], MD;  Location: [place];  Service: Orthopedics;  Laterality: Left;   Family History  Problem Relation Age of Onset  . Colon cancer Other     Age of Onset-late 73's   History  Substance Use Topics  . Smoking status: Current Some Day Smoker -- 0.00 packs/day for 30 years    Types: Cigars  . Smokeless tobacco: Never Used     Comment: smokes an occasional cigar  . Alcohol Use: Yes     Comment: occasionally    Review of Systems  All other systems reviewed and are negative.   Allergies  Sulfa antibiotics and Coconut oil  Home Medications   Prior to Admission medications   Medication Sig Start Date End Date Taking? Authorizing Provider  DULoxetine (CYMBALTA) 30 MG capsule Take 30 mg by mouth at bedtime.     Yes Historical Provider, MD  gabapentin (NEURONTIN) 800 MG tablet Take 800 mg by mouth 3 (three) times daily.     Yes Historical Provider, MD  LORazepam (ATIVAN) 0.5 MG tablet Take 0.5 mg by mouth at bedtime.     Yes Historical Provider, MD  Multiple Vitamin (MULTIVITAMIN) tablet Take 1 tablet by  mouth daily.   Yes Historical Provider, MD  oxyCODONE-acetaminophen (ROXICET) 5-325 MG per tablet Take 1 tablet by mouth every 4 (four) hours as needed. 07/27/14  Yes Leighton Parody, PA-C  pravastatin (PRAVACHOL) 20 MG tablet Take 40 mg by mouth daily.    Yes Historical Provider, MD  Probiotic Product (ALIGN) 4 MG CAPS Take 4 mg by mouth daily.     Yes Historical Provider, MD  rizatriptan (MAXALT-MLT) 10 MG disintegrating tablet Take 10 mg by mouth as needed. May repeat in 2 hours if needed MIGRAINE    Yes Historical Provider, MD  topiramate (TOPAMAX) 25 MG capsule Take 75 mg by mouth at bedtime.    Yes Historical Provider, MD   BP 108/78 mmHg  Pulse 56  Temp(Src) 98 F (36.7 C) (Oral)  Resp 14  Ht 6\' 3"  (1.905 m)  Wt 225 lb (102.059 kg)  BMI 28.12 kg/m2  SpO2 96%   Physical Exam  Nursing  note and vitals reviewed. General: Well-developed, well-nourished male in no acute distress; appearance consistent with age of record HENT: normocephalic; atraumatic Eyes: pupils equal, round and reactive to light; extraocular muscles intact Neck: supple Heart: regular rate and rhythm; no murmurs, rubs or gallops Lungs: rales in bases that cleared with deep breathing.  Abdomen: soft; nondistended; nontender; no masses or hepatosplenomegaly; bowel sounds present Extremities: No deformity; full range of motion; pulses normal; bandaged surgical incision left shoulder, ROM not tested due to recent surgery otherwise extremitas unremarkable.  Neurologic: Awake, alert and oriented; motor function intact in all extremities and symmetric; no facial droop Skin: Warm and dry Psychiatric: Normal mood and affect  ED Course  Procedures (including critical care time) DIAGNOSTIC STUDIES: Oxygen Saturation is 96% on RA, adequate by my interpretation.    COORDINATION OF CARE: 12:38 AM-Discussed treatment plan with pt at bedside and pt agreed to plan.    EKG Interpretation   Date/Time:  Sunday July 31 2014 23:55:36 EST Ventricular Rate:  58 PR Interval:  159 QRS Duration: 99 QT Interval:  406 QTC Calculation: 399 R Axis:   103 Text Interpretation:  Sinus rhythm Atrial premature complex Right axis  deviation Lead III QRS morphology has changed Confirmed by Sherald Balbuena  MD,  Jenny Reichmann ([id number]) on 08/01/2014 12:06:53 AM      MDM   Nursing notes and vitals signs, including pulse oximetry, reviewed.  Summary of this visit's results, reviewed by myself:  Labs:  Results for orders placed or performed during the hospital encounter of 07/31/14 (from the past 24 hour(s))  CBC with Differential     Status: Abnormal   Collection Time: 07/31/14 11:55 PM  Result Value Ref Range   WBC 5.5 4.0 - 10.5 K/uL   RBC 5.22 4.22 - 5.81 MIL/uL   Hemoglobin 17.2 (H) 13.0 - 17.0 g/dL   HCT 49.0 39.0 - 52.0 %   MCV  93.9 78.0 - 100.0 fL   MCH 33.0 26.0 - 34.0 pg   MCHC 35.1 30.0 - 36.0 g/dL   RDW 13.3 11.5 - 15.5 %   Platelets 198 150 - 400 K/uL   Neutrophils Relative % 67 43 - 77 %   Neutro Abs 3.7 1.7 - 7.7 K/uL   Lymphocytes Relative 21 12 - 46 %   Lymphs Abs 1.2 0.7 - 4.0 K/uL   Monocytes Relative 9 3 - 12 %   Monocytes Absolute 0.5 0.1 - 1.0 K/uL   Eosinophils Relative 3 0 - 5 %   Eosinophils Absolute 0.2  0.0 - 0.7 K/uL   Basophils Relative 0 0 - 1 %   Basophils Absolute 0.0 0.0 - 0.1 K/uL  Comprehensive metabolic panel     Status: Abnormal   Collection Time: 07/31/14 11:55 PM  Result Value Ref Range   Sodium 139 135 - 145 mmol/L   Potassium 3.6 3.5 - 5.1 mmol/L   Chloride 109 96 - 112 mmol/L   CO2 24 19 - 32 mmol/L   Glucose, Bld 117 (H) 70 - 99 mg/dL   BUN 12 6 - 23 mg/dL   Creatinine, Ser 1.15 0.50 - 1.35 mg/dL   Calcium 9.6 8.4 - 10.5 mg/dL   Total Protein 7.1 6.0 - 8.3 g/dL   Albumin 4.1 3.5 - 5.2 g/dL   AST 23 0 - 37 U/L   ALT 17 0 - 53 U/L   Alkaline Phosphatase 75 39 - 117 U/L   Total Bilirubin 0.7 0.3 - 1.2 mg/dL   GFR calc non Af Amer 66 (L) >90 mL/min   GFR calc Af Amer 77 (L) >90 mL/min   Anion gap 6 5 - 15  Troponin I     Status: None   Collection Time: 07/31/14 11:55 PM  Result Value Ref Range   Troponin I <0.03 <0.031 ng/mL  I-stat troponin, ED     Status: None   Collection Time: 08/01/14 12:04 AM  Result Value Ref Range   Troponin i, poc 0.00 0.00 - 0.08 ng/mL   Comment 3          Troponin I     Status: None   Collection Time: 08/01/14  2:34 AM  Result Value Ref Range   Troponin I <0.03 <0.031 ng/mL    Imaging Studies: Dg Chest Portable 1 View  08/01/2014   CLINICAL DATA:  Chest pain radiating to the upper shoulders, starting earlier today. Recent history of left shoulder surgery.  EXAM: PORTABLE CHEST - 1 VIEW  COMPARISON:  12/18/2012  FINDINGS: The heart size and mediastinal contours are within normal limits. Both lungs are clear. The visualized skeletal  structures are unremarkable.  IMPRESSION: No active disease.   Electronically Signed   By: Lucienne Capers M.D.   On: 08/01/2014 00:50   1:11 AM Significant improvement after GI cocktail.   3:23 AM Patient continues to be pain-free after GI cocktail and Protonix IV. Serial troponins have been negative.  I personally performed the services described in this documentation, which was scribed in my presence. The recorded information has been reviewed and is accurate.     Wynetta Fines, MD 08/01/14 [place], MD 08/01/14 [phone]

## 2014-08-01 NOTE — ED Notes (Signed)
Pt alert & oriented x4, stable gait. Patient  given discharge instructions, paperwork & prescription(s). Patient verbalized understanding. Pt left department w/ no further questions. 

## 2014-12-27 ENCOUNTER — Encounter: Payer: Self-pay | Admitting: *Deleted

## 2015-02-10 ENCOUNTER — Encounter: Payer: Self-pay | Admitting: Cardiovascular Disease

## 2015-06-18 ENCOUNTER — Emergency Department (HOSPITAL_COMMUNITY): Payer: PRIVATE HEALTH INSURANCE | Admitting: Anesthesiology

## 2015-06-18 ENCOUNTER — Encounter (HOSPITAL_COMMUNITY): Payer: Self-pay

## 2015-06-18 ENCOUNTER — Emergency Department (HOSPITAL_COMMUNITY): Payer: PRIVATE HEALTH INSURANCE

## 2015-06-18 ENCOUNTER — Emergency Department (HOSPITAL_COMMUNITY)
Admission: EM | Admit: 2015-06-18 | Discharge: 2015-06-18 | Disposition: A | Discharge status: Home / Self Care | Payer: PRIVATE HEALTH INSURANCE | Attending: Emergency Medicine | Admitting: Emergency Medicine

## 2015-06-18 ENCOUNTER — Encounter (HOSPITAL_COMMUNITY)
Admission: EM | Disposition: A | Discharge status: Home / Self Care | Payer: Self-pay | Source: Home / Self Care | Attending: Emergency Medicine

## 2015-06-18 DIAGNOSIS — N132 Hydronephrosis with renal and ureteral calculous obstruction: Secondary | ICD-10-CM | POA: Diagnosis not present

## 2015-06-18 DIAGNOSIS — N201 Calculus of ureter: Secondary | ICD-10-CM

## 2015-06-18 DIAGNOSIS — G629 Polyneuropathy, unspecified: Secondary | ICD-10-CM | POA: Diagnosis not present

## 2015-06-18 DIAGNOSIS — R1031 Right lower quadrant pain: Secondary | ICD-10-CM | POA: Diagnosis not present

## 2015-06-18 DIAGNOSIS — N23 Unspecified renal colic: Secondary | ICD-10-CM | POA: Insufficient documentation

## 2015-06-18 DIAGNOSIS — R011 Cardiac murmur, unspecified: Secondary | ICD-10-CM | POA: Diagnosis not present

## 2015-06-18 DIAGNOSIS — K219 Gastro-esophageal reflux disease without esophagitis: Secondary | ICD-10-CM | POA: Insufficient documentation

## 2015-06-18 DIAGNOSIS — Z9889 Other specified postprocedural states: Secondary | ICD-10-CM | POA: Diagnosis not present

## 2015-06-18 DIAGNOSIS — H269 Unspecified cataract: Secondary | ICD-10-CM | POA: Diagnosis not present

## 2015-06-18 DIAGNOSIS — Z79899 Other long term (current) drug therapy: Secondary | ICD-10-CM | POA: Insufficient documentation

## 2015-06-18 DIAGNOSIS — M1990 Unspecified osteoarthritis, unspecified site: Secondary | ICD-10-CM | POA: Insufficient documentation

## 2015-06-18 DIAGNOSIS — F17210 Nicotine dependence, cigarettes, uncomplicated: Secondary | ICD-10-CM | POA: Insufficient documentation

## 2015-06-18 DIAGNOSIS — R52 Pain, unspecified: Secondary | ICD-10-CM

## 2015-06-18 DIAGNOSIS — Z87442 Personal history of urinary calculi: Secondary | ICD-10-CM | POA: Insufficient documentation

## 2015-06-18 DIAGNOSIS — E785 Hyperlipidemia, unspecified: Secondary | ICD-10-CM | POA: Diagnosis not present

## 2015-06-18 DIAGNOSIS — G43909 Migraine, unspecified, not intractable, without status migrainosus: Secondary | ICD-10-CM | POA: Diagnosis not present

## 2015-06-18 HISTORY — PX: CYSTOSCOPY W/ URETERAL STENT PLACEMENT: SHX1429

## 2015-06-18 HISTORY — PX: URETEROSCOPY: SHX842

## 2015-06-18 HISTORY — DX: Hypothyroidism, unspecified: E03.9

## 2015-06-18 LAB — COMPREHENSIVE METABOLIC PANEL
ALT: 13 U/L — AB (ref 17–63)
AST: 16 U/L (ref 15–41)
Albumin: 4.2 g/dL (ref 3.5–5.0)
Alkaline Phosphatase: 73 U/L (ref 38–126)
Anion gap: 9 (ref 5–15)
BUN: 14 mg/dL (ref 6–20)
CO2: 23 mmol/L (ref 22–32)
CREATININE: 1.62 mg/dL — AB (ref 0.61–1.24)
Calcium: 9.5 mg/dL (ref 8.9–10.3)
Chloride: 108 mmol/L (ref 101–111)
GFR, EST AFRICAN AMERICAN: 51 mL/min — AB (ref >60)
GFR, EST NON AFRICAN AMERICAN: 44 mL/min — AB (ref >60)
Glucose, Bld: 122 mg/dL — ABNORMAL HIGH (ref 65–99)
POTASSIUM: 4.1 mmol/L (ref 3.5–5.1)
Sodium: 140 mmol/L (ref 135–145)
Total Bilirubin: 1.1 mg/dL (ref 0.3–1.2)
Total Protein: 7 g/dL (ref 6.5–8.1)

## 2015-06-18 LAB — URINALYSIS, ROUTINE W REFLEX MICROSCOPIC
BILIRUBIN URINE: NEGATIVE
Glucose, UA: NEGATIVE mg/dL
Ketones, ur: NEGATIVE mg/dL
Leukocytes, UA: NEGATIVE
NITRITE: NEGATIVE
Specific Gravity, Urine: 1.025 (ref 1.005–1.030)
pH: 6 (ref 5.0–8.0)

## 2015-06-18 LAB — CBC WITH DIFFERENTIAL/PLATELET
Basophils Absolute: 0 10*3/uL (ref 0.0–0.1)
Basophils Relative: 0 %
EOS PCT: 1 %
Eosinophils Absolute: 0.1 10*3/uL (ref 0.0–0.7)
HCT: 50.4 % (ref 39.0–52.0)
Hemoglobin: 17.5 g/dL — ABNORMAL HIGH (ref 13.0–17.0)
LYMPHS ABS: 0.8 10*3/uL (ref 0.7–4.0)
LYMPHS PCT: 8 %
MCH: 32.6 pg (ref 26.0–34.0)
MCHC: 34.7 g/dL (ref 30.0–36.0)
MCV: 94 fL (ref 78.0–100.0)
MONOS PCT: 9 %
Monocytes Absolute: 0.9 10*3/uL (ref 0.1–1.0)
Neutro Abs: 8 10*3/uL — ABNORMAL HIGH (ref 1.7–7.7)
Neutrophils Relative %: 82 %
Platelets: 210 10*3/uL (ref 150–400)
RBC: 5.36 MIL/uL (ref 4.22–5.81)
RDW: 12.9 % (ref 11.5–15.5)
WBC: 9.9 10*3/uL (ref 4.0–10.5)

## 2015-06-18 LAB — URINE MICROSCOPIC-ADD ON
BACTERIA UA: NONE SEEN
SQUAMOUS EPITHELIAL / LPF: NONE SEEN
WBC UA: NONE SEEN WBC/hpf (ref 0–5)

## 2015-06-18 SURGERY — CYSTOSCOPY, WITH RETROGRADE PYELOGRAM AND URETERAL STENT INSERTION
Anesthesia: General | Site: Ureter | Laterality: Right

## 2015-06-18 MED ORDER — SODIUM CHLORIDE 0.9 % IR SOLN
Status: DC | PRN
  Administered 2015-06-18: 3000 mL

## 2015-06-18 MED ORDER — SUCCINYLCHOLINE CHLORIDE 20 MG/ML IJ SOLN
INTRAMUSCULAR | Status: AC
  Filled 2015-06-18: qty 1

## 2015-06-18 MED ORDER — OXYCODONE HCL 5 MG PO TABS: 5.0000 mg | ORAL_TABLET | ORAL | Status: DC | PRN

## 2015-06-18 MED ORDER — CEPHALEXIN 500 MG PO CAPS: 500.0000 mg | ORAL_CAPSULE | Freq: Four times a day (QID) | ORAL | Status: DC

## 2015-06-18 MED ORDER — ONDANSETRON HCL 4 MG/2ML IJ SOLN
4.0000 mg | Freq: Once | INTRAMUSCULAR | Status: AC
  Administered 2015-06-18: 4 mg via INTRAVENOUS
  Filled 2015-06-18: qty 2

## 2015-06-18 MED ORDER — PHENYLEPHRINE 40 MCG/ML (10ML) SYRINGE FOR IV PUSH (FOR BLOOD PRESSURE SUPPORT)
PREFILLED_SYRINGE | INTRAVENOUS | Status: AC
  Filled 2015-06-18: qty 10

## 2015-06-18 MED ORDER — LACTATED RINGERS IV SOLN
INTRAVENOUS | Status: DC | PRN
  Administered 2015-06-18: 19:00:00 via INTRAVENOUS

## 2015-06-18 MED ORDER — OXYCODONE-ACETAMINOPHEN 5-325 MG PO TABS: 1.0000 | ORAL_TABLET | ORAL | Status: DC | PRN

## 2015-06-18 MED ORDER — ACETAMINOPHEN 650 MG RE SUPP
650.0000 mg | RECTAL | Status: DC | PRN
  Filled 2015-06-18: qty 1

## 2015-06-18 MED ORDER — KETOROLAC TROMETHAMINE 30 MG/ML IJ SOLN
30.0000 mg | Freq: Once | INTRAMUSCULAR | Status: AC
  Administered 2015-06-18: 30 mg via INTRAVENOUS
  Filled 2015-06-18: qty 1

## 2015-06-18 MED ORDER — CEFAZOLIN SODIUM 1-5 GM-% IV SOLN
INTRAVENOUS | Status: AC
  Filled 2015-06-18: qty 50

## 2015-06-18 MED ORDER — HYDROMORPHONE HCL 1 MG/ML IJ SOLN
1.0000 mg | Freq: Once | INTRAMUSCULAR | Status: AC
  Administered 2015-06-18: 1 mg via INTRAVENOUS
  Filled 2015-06-18: qty 1

## 2015-06-18 MED ORDER — CEFAZOLIN SODIUM-DEXTROSE 2-3 GM-% IV SOLR
2.0000 g | Freq: Once | INTRAVENOUS | Status: AC
  Administered 2015-06-18: 2 g via INTRAVENOUS
  Filled 2015-06-18 (×2): qty 50

## 2015-06-18 MED ORDER — PROPOFOL 10 MG/ML IV BOLUS
INTRAVENOUS | Status: AC
  Filled 2015-06-18: qty 20

## 2015-06-18 MED ORDER — STERILE WATER FOR IRRIGATION IR SOLN
Status: DC | PRN
  Administered 2015-06-18: 500 mL

## 2015-06-18 MED ORDER — ARTIFICIAL TEARS OP OINT
TOPICAL_OINTMENT | OPHTHALMIC | Status: DC | PRN
  Administered 2015-06-18: 1 via OPHTHALMIC

## 2015-06-18 MED ORDER — FENTANYL CITRATE (PF) 100 MCG/2ML IJ SOLN
25.0000 ug | INTRAMUSCULAR | Status: DC | PRN
  Administered 2015-06-18: 25 ug via INTRAVENOUS
  Filled 2015-06-18: qty 2

## 2015-06-18 MED ORDER — SODIUM CHLORIDE 0.9 % IJ SOLN
INTRAMUSCULAR | Status: AC
  Filled 2015-06-18: qty 10

## 2015-06-18 MED ORDER — FENTANYL CITRATE (PF) 250 MCG/5ML IJ SOLN
INTRAMUSCULAR | Status: AC
  Filled 2015-06-18: qty 5

## 2015-06-18 MED ORDER — SODIUM CHLORIDE 0.9 % IJ SOLN: 3.0000 mL | Freq: Two times a day (BID) | INTRAMUSCULAR | Status: DC

## 2015-06-18 MED ORDER — IOHEXOL 350 MG/ML SOLN
INTRAVENOUS | Status: DC | PRN
  Administered 2015-06-18: 5 mL via INTRAVENOUS

## 2015-06-18 MED ORDER — DEXAMETHASONE SODIUM PHOSPHATE 4 MG/ML IJ SOLN
INTRAMUSCULAR | Status: DC | PRN
  Administered 2015-06-18: 8 mg via INTRAVENOUS

## 2015-06-18 MED ORDER — SODIUM CHLORIDE 0.9 % IJ SOLN: 3.0000 mL | INTRAMUSCULAR | Status: DC | PRN

## 2015-06-18 MED ORDER — DEXAMETHASONE SODIUM PHOSPHATE 4 MG/ML IJ SOLN
INTRAMUSCULAR | Status: AC
  Filled 2015-06-18: qty 2

## 2015-06-18 MED ORDER — PHENAZOPYRIDINE HCL 200 MG PO TABS: 200.0000 mg | ORAL_TABLET | Freq: Three times a day (TID) | ORAL | Status: DC | PRN

## 2015-06-18 MED ORDER — FENTANYL CITRATE (PF) 100 MCG/2ML IJ SOLN
INTRAMUSCULAR | Status: DC | PRN
  Administered 2015-06-18: 50 ug via INTRAVENOUS

## 2015-06-18 MED ORDER — PROPOFOL 10 MG/ML IV BOLUS
INTRAVENOUS | Status: DC | PRN
  Administered 2015-06-18: 150 mg via INTRAVENOUS

## 2015-06-18 MED ORDER — PROMETHAZINE HCL 25 MG PO TABS: 25.0000 mg | ORAL_TABLET | Freq: Four times a day (QID) | ORAL | Status: DC | PRN

## 2015-06-18 MED ORDER — SODIUM CHLORIDE 0.9 % IV SOLN: 250.0000 mL | INTRAVENOUS | Status: DC | PRN

## 2015-06-18 MED ORDER — EPHEDRINE SULFATE 50 MG/ML IJ SOLN
INTRAMUSCULAR | Status: DC | PRN
  Administered 2015-06-18 (×2): 10 mg via INTRAVENOUS

## 2015-06-18 MED ORDER — ACETAMINOPHEN 325 MG PO TABS: 650.0000 mg | ORAL_TABLET | ORAL | Status: DC | PRN

## 2015-06-18 MED ORDER — MIDAZOLAM HCL 5 MG/5ML IJ SOLN
INTRAMUSCULAR | Status: DC | PRN
  Administered 2015-06-18: 2 mg via INTRAVENOUS

## 2015-06-18 MED ORDER — MIDAZOLAM HCL 2 MG/2ML IJ SOLN
INTRAMUSCULAR | Status: AC
  Filled 2015-06-18: qty 2

## 2015-06-18 MED ORDER — ARTIFICIAL TEARS OP OINT
TOPICAL_OINTMENT | OPHTHALMIC | Status: AC
  Filled 2015-06-18: qty 3.5

## 2015-06-18 MED ORDER — GLYCOPYRROLATE 0.2 MG/ML IJ SOLN
INTRAMUSCULAR | Status: DC | PRN
  Administered 2015-06-18: 0.2 mg via INTRAVENOUS

## 2015-06-18 MED ORDER — EPHEDRINE SULFATE 50 MG/ML IJ SOLN
INTRAMUSCULAR | Status: AC
  Filled 2015-06-18: qty 1

## 2015-06-18 SURGICAL SUPPLY — 18 items
BAG DRAIN URO TABLE W/ADPT NS (DRAPE) ×3 IMPLANT
BAG DRN 8 ADPR NS SKTRN CSTL (DRAPE) ×1
BAG HAMPER (MISCELLANEOUS) ×3 IMPLANT
CATH URET 5FR 28IN OPEN ENDED (CATHETERS) ×3 IMPLANT
CLOTH BEACON ORANGE TIMEOUT ST (SAFETY) ×4 IMPLANT
GLOVE ECLIPSE 6.5 STRL STRAW (GLOVE) ×2 IMPLANT
GLOVE EXAM NITRILE MD LF STRL (GLOVE) ×2 IMPLANT
GLOVE INDICATOR 7.0 STRL GRN (GLOVE) ×4 IMPLANT
GLOVE SURG SS PI 8.0 STRL IVOR (GLOVE) ×3 IMPLANT
GUIDEWIRE STR DUAL SENSOR (WIRE) ×3 IMPLANT
IV NS IRRIG 3000ML ARTHROMATIC (IV SOLUTION) ×4 IMPLANT
KIT ROOM TURNOVER AP CYSTO (KITS) ×3 IMPLANT
MANIFOLD NEPTUNE II (INSTRUMENTS) ×3 IMPLANT
PACK CYSTO (CUSTOM PROCEDURE TRAY) ×3 IMPLANT
PAD ARMBOARD 7.5X6 YLW CONV (MISCELLANEOUS) ×3 IMPLANT
STENT URET 6FRX26 CONTOUR (STENTS) ×2 IMPLANT
TOWEL OR 17X26 4PK STRL BLUE (TOWEL DISPOSABLE) ×3 IMPLANT
WATER STERILE IRR 1000ML POUR (IV SOLUTION) ×3 IMPLANT

## 2015-06-18 NOTE — Progress Notes (Signed)
Verified with Dr. Jeffie Pollock  That patient was to receive 6 percocet tablets. Amy Adams CRNA verified a total of 6 tablets were in bottle. Bottle was given to patients wife Bubba Daykin.

## 2015-06-18 NOTE — Brief Op Note (Signed)
06/18/2015  7:23 PM  PATIENT:  Royetta Crochet  65 y.o. male  PRE-OPERATIVE DIAGNOSIS:  Right Ureteral Calculus  POST-OPERATIVE DIAGNOSIS:  Right Ureteral Calculus  PROCEDURE:  Procedure(s): CYSTOSCOPY WITH RIGHT RETROGRADE PYELOGRAM RIGHT URETERAL STENT PLACEMENT (Right) URETEROSCOPY (Right)  SURGEON:  Surgeon(s) and Role:    * [name], MD - Primary  PHYSICIAN ASSISTANT:   ASSISTANTS: none   ANESTHESIA:   general  EBL:  Total I/O In: 300 [I.V.:300] Out: -   BLOOD ADMINISTERED:none  DRAINS: 6 x 26 JJ stent right   LOCAL MEDICATIONS USED:  NONE  SPECIMEN:  No Specimen  DISPOSITION OF SPECIMEN:  N/A  COUNTS:  YES  TOURNIQUET:  * No tourniquets in log *  DICTATION: .Other Dictation: Dictation Number [id number]  PLAN OF CARE: Discharge to home after PACU  PATIENT DISPOSITION:  PACU - hemodynamically stable.   Delay start of Pharmacological VTE agent (>24hrs) due to surgical blood loss or risk of bleeding: not applicable

## 2015-06-18 NOTE — Transfer of Care (Signed)
Immediate Anesthesia Transfer of Care Note  Patient: Frank Weber  Procedure(s) Performed: Procedure(s): CYSTOSCOPY WITH RIGHT RETROGRADE PYELOGRAM RIGHT URETERAL STENT PLACEMENT (Right) URETEROSCOPY (Right)  Patient Location: PACU  Anesthesia Type:General  Level of Consciousness: awake, oriented and patient cooperative  Airway & Oxygen Therapy: Patient Spontanous Breathing and Patient connected to face mask oxygen  Post-op Assessment: Report given to RN and Post -op Vital signs reviewed and stable  Post vital signs: Reviewed and stable  Last Vitals:  Filed Vitals:   06/18/15 1603 06/18/15 1702  BP: 108/75 112/78  Pulse: 56 54  Temp:    Resp: 16 18    Complications: No apparent anesthesia complications

## 2015-06-18 NOTE — Discharge Instructions (Addendum)
Ureteral Stent Implantation Ureteral stent implantation is the implantation of a soft plastic tube with multiple holes into the tube that drains urine from your kidney to your bladder (ureter). The stent helps drain your kidney when there is a blockage of the flow of urine in your ureter. The stent has a coil on each end to keep it from falling out. One end stays in the kidney. The other end stays in the bladder. It is most often taken out after any blockage has been removed or your ureter has healed. Short-term stents have a string attached to make removal quite easy. Removal of a short-term stent can be done in your health care provider's office or by you at home. Long-term stents need to be changed every few months. LET [facility] CARE PROVIDER KNOW ABOUT:  Any allergies you have.  All medicines you are taking, including vitamins, herbs, eye drops, creams, and over-the-counter medicines.  Previous problems you or members of your family have had with the use of anesthetics.  Any blood disorders you have.  Previous surgeries you have had.  Medical conditions you have. RISKS AND COMPLICATIONS Generally, ureteral stent implantation is a safe procedure. However, as with any procedure, complications can occur. Possible complications include:  Movement of the stent away from where it was originally placed (migration). This may affect the ability of the stent to properly drain your kidney. If migration of the stent occurs, the stent may need to be replaced or repositioned.  Perforation of the ureter.  Infection. BEFORE THE PROCEDURE  You may be asked to wash your genital area with sterile soap the morning of your procedure.  You may be given an oral antibiotic which you should take with a sip of water as prescribed by your health care provider.  You may be asked to not eat or drink for 8 hours before the surgery. PROCEDURE  First you will be given an anesthetic so you do not feel pain  during the procedure.  Your health care provider will insert a special lighted instrument called a cystoscope into your bladder. This allows your health care provider to see the opening to your ureter.  A thin wire is carefully threaded into your bladder and up the ureter. The stent is inserted over the wire and the wire is then removed.  Your bladder will be emptied of urine. AFTER THE PROCEDURE You will be taken to a recovery room until it is okay for you to go home.   This information is not intended to replace advice given to you by your health care provider. Make sure you discuss any questions you have with your health care provider.   Document Released: 05/24/2000 Document Revised: 06/01/2013 Document Reviewed: 12/09/2014 Elsevier Interactive Patient Education 2016 [place] CARE INSTRUCTIONS  Activity: Rest for the remainder of the day.  Do not drive or operate equipment today.  You may resume normal activities in one to two days as instructed by your physician.   Meals: Drink plenty of liquids and eat light foods such as gelatin or soup this evening.  You may return to a normal meal plan tomorrow.  Return to Work: You may return to work in one to two days or as instructed by your physician.  Special Instructions / Symptoms: Call your physician if any of these symptoms occur:   -persistent or heavy bleeding  -bleeding which continues after first few urination  -large blood clots that are difficult to pass  -urine  stream diminishes or stops completely  -fever equal to or higher than 101 degrees Farenheit.  -cloudy urine with a strong, foul odor  -severe pain  Females should always wipe from front to back after elimination.  You may feel some burning pain when you urinate.  This should disappear with time.  Applying moist heat to the lower abdomen or a hot tub bath may help relieve the pain. \  I will have my office call to arrange the next procedure to  remove the stone.     Patient Signature:  ________________________________________________________  Nurse's Signature:  ________________________________________________________

## 2015-06-18 NOTE — Op Note (Signed)
NAMECHEVY, UBALDO                ACCOUNT NO.:  [id number]  MEDICAL RECORD NO.:  [id number]  LOCATION:  [facility]                          FACILITY:  [facility]  PHYSICIAN:  Marshall Cork. Jeffie Pollock, M.D.    DATE OF BIRTH:  10-27-1950  DATE OF PROCEDURE:  06/18/2015 DATE OF DISCHARGE:                              OPERATIVE REPORT   PATIENT OF:  Marshall Cork. Jeffie Pollock, M.D.  PROCEDURES:  Cystoscopy, right retrograde pyelogram with interpretation, right ureteroscopy, insertion of right double-J stent.  PREOPERATIVE DIAGNOSIS:  Right mid ureteral stone.  POSTOPERATIVE DIAGNOSIS:  Right distal ureteral stone.  SURGEON:  Marshall Cork. Jeffie Pollock, M.D.  ANESTHESIA:  General.  DRAINS:  A 6-French x 26 cm contour right double-J stent.  SPECIMEN:  None.  BLOOD LOSS:  None.  COMPLICATIONS:  None.  INDICATIONS:  Mr. Tonthat is a 65 year old white male with history of stones who presented to the emergency room with severe right flank pain with nausea and vomiting.  He was found to have an 8 mm right mid to distal ureteral stone.  His pain was not controlled with Dilaudid or Toradol and was felt that stenting was indicated.  FINDINGS OF PROCEDURE:  He was given 2 g of Ancef.  He was taken to the operating room where general anesthetic was induced.  He was placed in lithotomy position.  His perineum and genitalia were prepped with Betadine solution.  He was draped in usual sterile fashion.  Cystoscopy was performed using a 23-French scope and 30-degree lens. Examination revealed a normal urethra.  The external sphincter was intact.  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  Examination of the bladder revealed mild trabeculation.  No tumors were noted, but there was some material in the base of the bladder that initially appeared to be consistent with possible stones; but once it was flushed, the bladder was quite soft and was felt to be possibly purulent  material.  The ureteral orifices were unremarkable.  The right ureteral orifice was cannulated with a 5-French open-end catheter, and a gentle retrograde was performed.  This demonstrated a filling defect in the distal ureter consistent with a stone.  The distal 2 cm of the ureter was relatively narrow.  There was proximal hydronephrosis.  A Sensor guidewire was passed through the open-end catheter; but because of the location of stone and the angle of the ureter, it did not easily pass, and I was afraid that if I attempted further, then I would perforate the ureter.  I then removed the cystoscope and inserted the 4-1/2-French short semi rigid ureteroscope up the ureteral orifice without difficulty.  I was able to get the level of the stone.  However, the stone appeared too large to successfully basket and access.  Laser was not available.  A Sensor guidewire was then inserted through the scope and passed by the medial side of the stone.  This easily progressed into the renal collecting system.  The ureteroscope was then removed.  The cystoscope was inserted over the wire, and a 6-French 26-cm double-J stent with no tether was then passed over the wire to the kidney.  There was resistance to passage at the level of stone probably due to friction from the stone itself however was able to negotiate the stent to the level of the renal pelvis; and upon removal of wire, a coil formed within the renal pelvis on fluoroscopy.  A good coil formed in the bladder.  The bladder was drained.  The patient was taken down from lithotomy position.  His anesthetic was reversed.  He was moved to recovery room in stable condition.  There were no complications.     Marshall Cork. Jeffie Pollock, M.D.     JJW/MEDQ  D:  06/18/2015  T:  06/18/2015  Job:  [id number]

## 2015-06-18 NOTE — Consult Note (Signed)
Subjective: Frank Weber is a 65 yo WM who I was asked to see in consultation by Dr. Eulis Foster for a right ureteral stone.   He has a prior history of stones and has been seen in our [place] office in the past.   He had the onset last week of mild right side pain but today he had the onset of severe right flank pain with nausea and vomiting.  The pain radiated to the groin and was associated with bowel and bladder urgency.  His pain was not controlled with dilaudid or toradol.  He has passed one previous stone and has no other GU history.   He does have a history of possible renal insufficiency and his Cr. Is elevated today.  ROS:  Review of Systems  Constitutional: Negative for fever and chills.  Respiratory: Positive for shortness of breath (mild).   Gastrointestinal: Positive for nausea, vomiting and abdominal pain (right flank and groin pain).  Genitourinary: Positive for urgency.  All other systems reviewed and are negative.   Allergies  Allergen Reactions  . Sulfa Antibiotics Shortness Of Breath and Rash  . Coconut Oil Rash    Past Medical History  Diagnosis Date  . IBS (irritable bowel syndrome)     states slight  . Hyperlipidemia   . Migraines   . Arthritis     right knee  . Peripheral neuropathy (HCC)     legs and feet  . GERD (gastroesophageal reflux disease)     no current med.  . Rotator cuff tear 07/2014    left  . Runny nose 07/20/2014    clear drainage  . Immature cataract   . Dental crowns present     also dental implants  . History of kidney stones   . History of cardiac murmur     states no problems; no cardiac testing since 2009  . History of stress test 06/2011    Showed normal myocardial perfusion.  Marland Kitchen Hx of echocardiogram 07/2006    notable for mild AI, otherwise it was fairly remarkable.  . Hypothyroidism   . Melanoma [facility])     Past Surgical History  Procedure Laterality Date  . Knee arthroscopy w/ acl reconstruction Right   . Knee arthroscopy  Right   . Rotator cuff repair Right   . Inguinal hernia repair Left 10/21/2007  . Tonsillectomy and adenoidectomy  1967  . Colonoscopy N/A 02/17/2013    Procedure: COLONOSCOPY;  Surgeon: Rogene Houston, MD;  [facility];  Service: Endoscopy;  Laterality: N/A;  225  . Cardiac catheterization  06/2007    This revealed a mild 30% proximal stenosis but there was no other significant coronary artery disease. His EF was a low normal of approximately 50%.  . Shoulder arthroscopy with open rotator cuff repair Left 07/27/2014    Procedure: LEFT SHOULDER ARTHROSCOPY WITH MINI OPEN ROTATOR CUFF REPAIR;  Surgeon: [name], MD;  Location: [facility];  Service: Orthopedics;  Laterality: Left;  . Head & neck skin lesion excisional biopsy      Social History   Social History  . Marital Status: Married    Spouse Name: N/A  . Number of Children: N/A  . Years of Education: N/A   Occupational History  . Not on file.   Social History Main Topics  . Smoking status: Current Some Day Smoker -- 0.00 packs/day for 30 years    Types: Cigars  . Smokeless tobacco: Never Used  Comment: smokes an occasional cigar  . Alcohol Use: Yes     Comment: occasionally  . Drug Use: No  . Sexual Activity: Not on file   Other Topics Concern  . Not on file   Social History Narrative    Family History  Problem Relation Age of Onset  . Colon cancer Other     Age of Onset-late 19's    Anti-infectives: Anti-infectives    None      No current facility-administered medications for this encounter.   Current Outpatient Prescriptions  Medication Sig Dispense Refill  . DULoxetine (CYMBALTA) 30 MG capsule Take 30 mg by mouth at bedtime.      . gabapentin (NEURONTIN) 800 MG tablet Take 800 mg by mouth 3 (three) times daily.      Marland Kitchen levothyroxine (SYNTHROID, LEVOTHROID) 25 MCG tablet Take 25 mcg by mouth daily before breakfast.    . LORazepam (ATIVAN) 0.5 MG tablet Take 0.5 mg by  mouth at bedtime.      . Multiple Vitamin (MULTIVITAMIN) tablet Take 1 tablet by mouth daily.    Marland Kitchen omeprazole (PRILOSEC) 40 MG capsule Take 1 capsule (40 mg total) by mouth daily. 30 capsule 0  . oxyCODONE-acetaminophen (ROXICET) 5-325 MG per tablet Take 1 tablet by mouth every 4 (four) hours as needed. 60 tablet 0  . pravastatin (PRAVACHOL) 20 MG tablet Take 40 mg by mouth daily.     . Probiotic Product (ALIGN) 4 MG CAPS Take 4 mg by mouth daily.      Marland Kitchen topiramate (TOPAMAX) 25 MG capsule Take 75 mg by mouth at bedtime.     . rizatriptan (MAXALT-MLT) 10 MG disintegrating tablet Take 10 mg by mouth as needed. May repeat in 2 hours if needed MIGRAINE      PSFHx reviewed and updated.   Objective: Vital signs in last 24 hours: Temp:  [97.8 F (36.6 C)] 97.8 F (36.6 C) (01/08 1104) Pulse Rate:  [51-58] 54 (01/08 1702) Resp:  [16-20] 18 (01/08 1702) BP: (108-129)/(75-85) 112/78 mmHg (01/08 1702) SpO2:  [95 %-99 %] 98 % (01/08 1702) Weight:  [102.059 kg (225 lb)] 102.059 kg (225 lb) (01/08 1104)  Intake/Output from previous day:   Intake/Output this shift:     Physical Exam  Constitutional: He is oriented to person, place, and time and well-developed, well-nourished, and in no distress.  HENT:  Head: Normocephalic and atraumatic.  Neck: Normal range of motion. Neck supple. No thyromegaly present.  Cardiovascular: Normal rate, regular rhythm and normal heart sounds.   Pulmonary/Chest: Effort normal and breath sounds normal. No respiratory distress.  Abdominal: Soft. He exhibits no distension and no mass. There is tenderness (right upper quadrant and flank that is moderate. ). There is guarding. There is no rebound.  No hernias  Genitourinary:  Normal circumcised phallus with adequate meatus.   Normal scrotum and testes.   Musculoskeletal: Normal range of motion. He exhibits no edema or tenderness.  Lymphadenopathy:    He has no cervical adenopathy.  Neurological: He is alert and  oriented to person, place, and time.  But medicated  Skin: Skin is warm and dry.  Psychiatric: Mood and affect normal.  Vitals reviewed.   Lab Results:   Recent Labs  06/18/15 1122  WBC 9.9  HGB 17.5*  HCT 50.4  PLT 210   BMET  Recent Labs  06/18/15 1122  NA 140  K 4.1  CL 108  CO2 23  GLUCOSE 122*  BUN 14  CREATININE 1.62*  CALCIUM 9.5   PT/INR No results for input(s): LABPROT, INR in the last 72 hours. ABG No results for input(s): PHART, HCO3 in the last 72 hours.  Invalid input(s): PCO2, PO2  Studies/Results: Ct Renal Stone Study  06/18/2015  CLINICAL DATA:  Right flank pain radiating to the right growing for several hours. History of inflammatory bowel disease. EXAM: CT ABDOMEN AND PELVIS WITHOUT CONTRAST TECHNIQUE: Multidetector CT imaging of the abdomen and pelvis was performed following the standard protocol without IV contrast. COMPARISON:  CT of the abdomen pelvis dated 06/05/2011 FINDINGS: Lower chest:  Mild left lung base atelectasis versus scarring. Hepatobiliary: No mass visualized on this un-enhanced exam. Pancreas: No mass or inflammatory process identified on this un-enhanced exam. Spleen: Within normal limits in size. Adrenals/Urinary Tract: There is an 8 x 4 mm obstructing stone within the distal right ureter, approximately 5-6 cm superior to the vesicoureteral junction. It causes upstream hydroureter, moderate hydronephrosis and adjacent perinephric and periureteral inflammatory stranding. No evidence of left urolithiasis or hydronephrosis. No definite mass visualized on this un-enhanced exam. Stomach/Bowel: No evidence of obstruction, inflammatory process, or abnormal fluid collections. Extensive diverticulosis of the sigmoid colon without evidence of diverticulitis is seen. Vascular/Lymphatic: No pathologically enlarged lymph nodes. No evidence of abdominal aortic aneurysm. Mild tortuosity and atherosclerotic disease of the aorta. Reproductive: Coarse  calcifications of the prostate gland are seen. The gland itself is not pathologically enlarged. Other: Layering hyperdense material is seen within the dependent portion of the urinary bladder, with uncertain significance. Musculoskeletal:  No suspicious bone lesions identified. IMPRESSION: Obstructing 8 mm distal right ureteral stone, with moderate right hydroureter and hydronephrosis, and associated adjacent inflammatory changes. Layering hyperdense material within the urinary bladder with uncertain significance. Electronically Signed   By: Fidela Salisbury M.D.   On: 06/18/2015 14:00     Assessment: He has an 68mm right mid ureteral stone with intractable pain.     I am going to take him to the OR for cystoscopy with possible ureteroscopy and right ureteral stent insertion.  He will probably need a secondary procedure to manage the stone.    I reviewed the risks of bleeding, infection, injury to the ureter or adjacent structures, need for a stent or secondary procedures, thrombotic events and anesthetic complications.  He is on topiramate with is associated with an increased risk of stones and should probably find an alternative med.     CC: Dr. Christ Kick.      Essa Malachi J 06/18/2015 [phone]

## 2015-06-18 NOTE — Anesthesia Preprocedure Evaluation (Addendum)
Anesthesia Evaluation  Patient identified by MRN, date of birth, ID band Patient awake    Reviewed: Allergy & Precautions, H&P   Airway Mallampati: I  TM Distance: >3 FB Neck ROM: full    Dental  (+) Teeth Intact   Pulmonary Current Smoker,    breath sounds clear to auscultation       Cardiovascular  Rhythm:regular Rate:Normal     Neuro/Psych  Headaches,    GI/Hepatic GERD  ,Nausea and vomiting today   Endo/Other  Hypothyroidism   Renal/GU      Musculoskeletal   Abdominal   Peds  Hematology   Anesthesia Other Findings   Reproductive/Obstetrics                            Anesthesia Physical Anesthesia Plan  ASA: II and emergent  Anesthesia Plan: General ETT, Rapid Sequence and Cricoid Pressure   Post-op Pain Management:    Induction: Intravenous, Rapid sequence and Cricoid pressure planned  Airway Management Planned: Oral ETT  Additional Equipment:   Intra-op Plan:   Post-operative Plan: Extubation in OR  Informed Consent: I have reviewed the patients History and Physical, chart, labs and discussed the procedure including the risks, benefits and alternatives for the proposed anesthesia with the patient or authorized representative who has indicated his/her understanding and acceptance.   Dental Advisory Given  Plan Discussed with: Anesthesiologist and Surgeon  Anesthesia Plan Comments:        Anesthesia Quick Evaluation

## 2015-06-18 NOTE — ED Notes (Signed)
Patient c/o nausea increasing.

## 2015-06-18 NOTE — ED Provider Notes (Signed)
CSN: [id number]     Arrival date & time 06/18/15  1054 History   First MD Initiated Contact with Patient 06/18/15 1154     Chief Complaint  Patient presents with  . Flank Pain     (Consider location/radiation/quality/duration/timing/severity/associated sxs/prior Treatment) HPI   Frank Weber is a 65 y.o. maleWho presents for evaluation of right flank pain present for 1 week, which has now progressed to the right lower abdomen, and radiates to the right testicle. Pain is gradually worsening over the last 2 days. The pain is "severe". He has mild urinary urgency but no dysuria or hematuria. He thought he was constipated, so took some stool softener, with subsequent bowel movements, multiple, but no relief of the discomfort. He has a remote history of kidney stone which he was able to pass. He denies fever, chills, weakness or dizziness. He has had nausea without vomiting for several days. There are no other known modifying factors.   Past Medical History  Diagnosis Date  . IBS (irritable bowel syndrome)     states slight  . Hyperlipidemia   . Migraines   . Arthritis     right knee  . Peripheral neuropathy (HCC)     legs and feet  . GERD (gastroesophageal reflux disease)     no current med.  . Rotator cuff tear 07/2014    left  . Runny nose 07/20/2014    clear drainage  . Immature cataract   . Dental crowns present     also dental implants  . History of kidney stones   . History of cardiac murmur     states no problems; no cardiac testing since 2009  . History of stress test 06/2011    Showed normal myocardial perfusion.  Marland Kitchen Hx of echocardiogram 07/2006    notable for mild AI, otherwise it was fairly remarkable.   Past Surgical History  Procedure Laterality Date  . Knee arthroscopy w/ acl reconstruction Right   . Knee arthroscopy Right   . Rotator cuff repair Right   . Inguinal hernia repair Left 10/21/2007  . Tonsillectomy and adenoidectomy  1967  . Colonoscopy N/A  02/17/2013    Procedure: COLONOSCOPY;  Surgeon: Rogene Houston, MD;  [facility];  Service: Endoscopy;  Laterality: N/A;  225  . Cardiac catheterization  06/2007    This revealed a mild 30% proximal stenosis but there was no other significant coronary artery disease. His EF was a low normal of approximately 50%.  . Shoulder arthroscopy with open rotator cuff repair Left 07/27/2014    Procedure: LEFT SHOULDER ARTHROSCOPY WITH MINI OPEN ROTATOR CUFF REPAIR;  Surgeon: [name], MD;  Location: [facility];  Service: Orthopedics;  Laterality: Left;   Family History  Problem Relation Age of Onset  . Colon cancer Other     Age of Onset-late 66's   Social History  Substance Use Topics  . Smoking status: Current Some Day Smoker -- 0.00 packs/day for 30 years    Types: Cigars  . Smokeless tobacco: Never Used     Comment: smokes an occasional cigar  . Alcohol Use: Yes     Comment: occasionally    Review of Systems  All other systems reviewed and are negative.     Allergies  Sulfa antibiotics and Coconut oil  Home Medications   Prior to Admission medications   Medication Sig Start Date End Date Taking? Authorizing Provider  DULoxetine (CYMBALTA) 30 MG capsule Take 30  mg by mouth at bedtime.     Yes Historical Provider, MD  gabapentin (NEURONTIN) 800 MG tablet Take 800 mg by mouth 3 (three) times daily.     Yes Historical Provider, MD  levothyroxine (SYNTHROID, LEVOTHROID) 25 MCG tablet Take 25 mcg by mouth daily before breakfast.   Yes Historical Provider, MD  LORazepam (ATIVAN) 0.5 MG tablet Take 0.5 mg by mouth at bedtime.     Yes Historical Provider, MD  Multiple Vitamin (MULTIVITAMIN) tablet Take 1 tablet by mouth daily.   Yes Historical Provider, MD  omeprazole (PRILOSEC) 40 MG capsule Take 1 capsule (40 mg total) by mouth daily. 08/01/14  Yes John Molpus, MD  oxyCODONE-acetaminophen (ROXICET) 5-325 MG per tablet Take 1 tablet by mouth every 4 (four)  hours as needed. 07/27/14  Yes Leighton Parody, PA-C  pravastatin (PRAVACHOL) 20 MG tablet Take 40 mg by mouth daily.    Yes Historical Provider, MD  Probiotic Product (ALIGN) 4 MG CAPS Take 4 mg by mouth daily.     Yes Historical Provider, MD  topiramate (TOPAMAX) 25 MG capsule Take 75 mg by mouth at bedtime.    Yes Historical Provider, MD  rizatriptan (MAXALT-MLT) 10 MG disintegrating tablet Take 10 mg by mouth as needed. May repeat in 2 hours if needed MIGRAINE     Historical Provider, MD   BP 112/78 mmHg  Pulse 54  Temp(Src) 97.8 F (36.6 C) (Oral)  Resp 18  Ht 6\' 3"  (1.905 m)  Wt 225 lb (102.059 kg)  BMI 28.12 kg/m2  SpO2 98% Physical Exam  Constitutional: He is oriented to person, place, and time. He appears well-developed and well-nourished.  HENT:  Head: Normocephalic and atraumatic.  Right Ear: External ear normal.  Left Ear: External ear normal.  Eyes: Conjunctivae and EOM are normal. Pupils are equal, round, and reactive to light.  Neck: Normal range of motion and phonation normal. Neck supple.  Cardiovascular: Normal rate, regular rhythm and normal heart sounds.   Pulmonary/Chest: Effort normal and breath sounds normal. He exhibits no bony tenderness.  Abdominal: Soft. There is tenderness (mild right mid and right lower quadrant abdominal tenderness).  Genitourinary:  No costovertebral angle tenderness with percussion.  Musculoskeletal: Normal range of motion.  Neurological: He is alert and oriented to person, place, and time. No cranial nerve deficit or sensory deficit. He exhibits normal muscle tone. Coordination normal.  Skin: Skin is warm, dry and intact.  Psychiatric: He has a normal mood and affect. His behavior is normal. Judgment and thought content normal.  Nursing note and vitals reviewed.   ED Course  Procedures (including critical care time)  Medications  ondansetron (ZOFRAN) injection 4 mg (4 mg Intravenous Given 06/18/15 1136)  HYDROmorphone (DILAUDID)  injection 1 mg (1 mg Intravenous Given 06/18/15 1223)  ondansetron (ZOFRAN) injection 4 mg (4 mg Intravenous Given 06/18/15 1223)  HYDROmorphone (DILAUDID) injection 1 mg (1 mg Intravenous Given 06/18/15 1411)  HYDROmorphone (DILAUDID) injection 1 mg (1 mg Intravenous Given 06/18/15 1544)  ketorolac (TORADOL) 30 MG/ML injection 30 mg (30 mg Intravenous Given 06/18/15 1545)  ondansetron (ZOFRAN) injection 4 mg (4 mg Intravenous Given 06/18/15 1544)  HYDROmorphone (DILAUDID) injection 1 mg (1 mg Intravenous Given 06/18/15 1714)    Patient Vitals for the past 24 hrs:  BP Temp Temp src Pulse Resp SpO2 Height Weight  06/18/15 1702 112/78 mmHg - - (!) 54 18 98 % - -  06/18/15 1603 108/75 mmHg - - (!) 56 16 97 % - -  06/18/15 1530 115/85 mmHg - - (!) 56 - 95 % - -  06/18/15 1402 123/81 mmHg - - (!) 58 18 99 % - -  06/18/15 1300 126/81 mmHg - - (!) 51 - 96 % - -  06/18/15 1104 129/85 mmHg 97.8 F (36.6 C) Oral (!) 56 20 99 % 6\' 3"  (1.905 m) 225 lb (102.059 kg)    2:15 PM Reevaluation with update and discussion. After initial assessment and treatment, an updated evaluation reveals pain was initially improved, now has returned. Patient updated on findings of obstructing kidney stone. Phil Michels L   15:07- discussed with urology, Dr. Jeffie Pollock, who states that he can be treated with Toradol, and reevaluated for pain control.  16:50- pain still 5/10, and he remains uncomfortable. He did not get any significant improvement with Toradol. We'll recontact urology to consider intervention for control of pain.  17:05- Discussion with Urology-  Dr. Jeffie Pollock. States he will take patient to the OR in 30 minutes. He would like to have a urinalysis done, prior to taking the patient to the operating room  Labs Review Labs Reviewed  CBC WITH DIFFERENTIAL/PLATELET - Abnormal; Notable for the following:    Hemoglobin 17.5 (*)    Neutro Abs 8.0 (*)    All other components within normal limits  COMPREHENSIVE METABOLIC PANEL -  Abnormal; Notable for the following:    Glucose, Bld 122 (*)    Creatinine, Ser 1.62 (*)    ALT 13 (*)    GFR calc non Af Amer 44 (*)    GFR calc Af Amer 51 (*)    All other components within normal limits  URINALYSIS, ROUTINE W REFLEX MICROSCOPIC (NOT AT [facility])    Imaging Review Ct Renal Stone Study  06/18/2015  CLINICAL DATA:  Right flank pain radiating to the right growing for several hours. History of inflammatory bowel disease. EXAM: CT ABDOMEN AND PELVIS WITHOUT CONTRAST TECHNIQUE: Multidetector CT imaging of the abdomen and pelvis was performed following the standard protocol without IV contrast. COMPARISON:  CT of the abdomen pelvis dated 06/05/2011 FINDINGS: Lower chest:  Mild left lung base atelectasis versus scarring. Hepatobiliary: No mass visualized on this un-enhanced exam. Pancreas: No mass or inflammatory process identified on this un-enhanced exam. Spleen: Within normal limits in size. Adrenals/Urinary Tract: There is an 8 x 4 mm obstructing stone within the distal right ureter, approximately 5-6 cm superior to the vesicoureteral junction. It causes upstream hydroureter, moderate hydronephrosis and adjacent perinephric and periureteral inflammatory stranding. No evidence of left urolithiasis or hydronephrosis. No definite mass visualized on this un-enhanced exam. Stomach/Bowel: No evidence of obstruction, inflammatory process, or abnormal fluid collections. Extensive diverticulosis of the sigmoid colon without evidence of diverticulitis is seen. Vascular/Lymphatic: No pathologically enlarged lymph nodes. No evidence of abdominal aortic aneurysm. Mild tortuosity and atherosclerotic disease of the aorta. Reproductive: Coarse calcifications of the prostate gland are seen. The gland itself is not pathologically enlarged. Other: Layering hyperdense material is seen within the dependent portion of the urinary bladder, with uncertain significance. Musculoskeletal:  No suspicious bone lesions  identified. IMPRESSION: Obstructing 8 mm distal right ureteral stone, with moderate right hydroureter and hydronephrosis, and associated adjacent inflammatory changes. Layering hyperdense material within the urinary bladder with uncertain significance. Electronically Signed   By: Fidela Salisbury M.D.   On: 06/18/2015 14:00   I have personally reviewed and evaluated these images and lab results as part of my medical decision-making.   EKG Interpretation None  MDM   Final diagnoses:  Ureteral stone with hydronephrosis  Ureteral colic    Relatively large right ureteral stone, with either nephrosis. Intractable pain, in the emergency department despite treatment. Consultation with urology for intervention, likely ureteral stenting.  Nursing Notes Reviewed/ Care Coordinated Applicable Imaging Reviewed Interpretation of Laboratory Data incorporated into ED treatment   Plan: OR for treatment and disposition    Daleen Bo, MD 06/20/15 1116

## 2015-06-18 NOTE — ED Notes (Signed)
Percocet Pre Pack given to Tresa Endo RN to dispense to patient at time of discharge.

## 2015-06-18 NOTE — Anesthesia Procedure Notes (Signed)
Procedure Name: Intubation Date/Time: 06/18/2015 6:58 PM Performed by: Andree Elk, Trinidad Ingle A Pre-anesthesia Checklist: Patient identified, Patient being monitored, Timeout performed, Emergency Drugs available and Suction available Patient Re-evaluated:Patient Re-evaluated prior to inductionOxygen Delivery Method: Circle System Utilized Preoxygenation: Pre-oxygenation with 100% oxygen Intubation Type: IV induction, Rapid sequence and Cricoid Pressure applied Ventilation: Mask ventilation without difficulty Laryngoscope Size: 3 and Miller Grade View: Grade I Tube type: Oral Tube size: 7.0 mm Number of attempts: 1 Airway Equipment and Method: Stylet Placement Confirmation: ETT inserted through vocal cords under direct vision,  positive ETCO2 and breath sounds checked- equal and bilateral Secured at: 21 cm Tube secured with: Tape Dental Injury: Teeth and Oropharynx as per pre-operative assessment

## 2015-06-18 NOTE — Anesthesia Postprocedure Evaluation (Signed)
Anesthesia Post Note  Patient: Frank Weber  Procedure(s) Performed: Procedure(s) (LRB): CYSTOSCOPY WITH RIGHT RETROGRADE PYELOGRAM RIGHT URETERAL STENT PLACEMENT (Right) URETEROSCOPY (Right)  Patient location during evaluation: PACU Anesthesia Type: General Level of consciousness: awake and alert and oriented Pain management: pain level controlled Vital Signs Assessment: post-procedure vital signs reviewed and stable Respiratory status: spontaneous breathing and respiratory function stable Cardiovascular status: stable Postop Assessment: no signs of nausea or vomiting Anesthetic complications: no    Last Vitals:  Filed Vitals:   06/18/15 1945 06/18/15 1956  BP: 113/75   Pulse: 65 72  Temp:    Resp: 13 12    Last Pain:  Filed Vitals:   06/18/15 1959  PainSc: 8                  ADAMS, AMY A

## 2015-06-18 NOTE — ED Notes (Signed)
Pt reports pain in r flank, r lower abd, and r testicle.  Reports has been having some difficulty urinating and problems with constipation.  Reports took MOM last night and had a bm.  Reports n/v.

## 2015-06-19 ENCOUNTER — Encounter (HOSPITAL_COMMUNITY): Payer: Self-pay | Admitting: Urology

## 2015-06-19 ENCOUNTER — Other Ambulatory Visit: Payer: Self-pay | Admitting: Urology

## 2015-06-19 MED FILL — Oxycodone w/ Acetaminophen Tab 5-325 MG: ORAL | Qty: 6 | Status: AC

## 2015-06-21 ENCOUNTER — Encounter (HOSPITAL_BASED_OUTPATIENT_CLINIC_OR_DEPARTMENT_OTHER): Payer: Self-pay | Admitting: *Deleted

## 2015-06-21 LAB — URINE CULTURE: Culture: NO GROWTH

## 2015-06-22 ENCOUNTER — Encounter (HOSPITAL_BASED_OUTPATIENT_CLINIC_OR_DEPARTMENT_OTHER): Payer: Self-pay | Admitting: *Deleted

## 2015-06-22 NOTE — Progress Notes (Signed)
NPO AFTER MN.  ARRIVE AT 0745.  CURRENT LAB RESULTS IN CHART AND EPIC.  WILL TAKE SYNTHROID AND NEURONTIN AM DOS W/ SIPS OF WATER

## 2015-06-27 ENCOUNTER — Ambulatory Visit (HOSPITAL_BASED_OUTPATIENT_CLINIC_OR_DEPARTMENT_OTHER): Payer: PRIVATE HEALTH INSURANCE | Admitting: Anesthesiology

## 2015-06-27 ENCOUNTER — Encounter (HOSPITAL_BASED_OUTPATIENT_CLINIC_OR_DEPARTMENT_OTHER)
Admission: RE | Disposition: A | Discharge status: Home / Self Care | Payer: Self-pay | Source: Ambulatory Visit | Attending: Urology

## 2015-06-27 ENCOUNTER — Ambulatory Visit (HOSPITAL_BASED_OUTPATIENT_CLINIC_OR_DEPARTMENT_OTHER)
Admission: RE | Admit: 2015-06-27 | Discharge: 2015-06-27 | Disposition: A | Discharge status: Home / Self Care | Payer: PRIVATE HEALTH INSURANCE | Source: Ambulatory Visit | Attending: Urology | Admitting: Urology

## 2015-06-27 ENCOUNTER — Encounter (HOSPITAL_BASED_OUTPATIENT_CLINIC_OR_DEPARTMENT_OTHER): Payer: Self-pay | Admitting: *Deleted

## 2015-06-27 DIAGNOSIS — K219 Gastro-esophageal reflux disease without esophagitis: Secondary | ICD-10-CM | POA: Diagnosis not present

## 2015-06-27 DIAGNOSIS — Z79899 Other long term (current) drug therapy: Secondary | ICD-10-CM | POA: Insufficient documentation

## 2015-06-27 DIAGNOSIS — Z87442 Personal history of urinary calculi: Secondary | ICD-10-CM | POA: Diagnosis not present

## 2015-06-27 DIAGNOSIS — E039 Hypothyroidism, unspecified: Secondary | ICD-10-CM | POA: Insufficient documentation

## 2015-06-27 DIAGNOSIS — N132 Hydronephrosis with renal and ureteral calculous obstruction: Secondary | ICD-10-CM | POA: Insufficient documentation

## 2015-06-27 DIAGNOSIS — F17210 Nicotine dependence, cigarettes, uncomplicated: Secondary | ICD-10-CM | POA: Insufficient documentation

## 2015-06-27 DIAGNOSIS — E785 Hyperlipidemia, unspecified: Secondary | ICD-10-CM | POA: Diagnosis not present

## 2015-06-27 DIAGNOSIS — Z85820 Personal history of malignant melanoma of skin: Secondary | ICD-10-CM | POA: Insufficient documentation

## 2015-06-27 DIAGNOSIS — M1990 Unspecified osteoarthritis, unspecified site: Secondary | ICD-10-CM | POA: Diagnosis not present

## 2015-06-27 HISTORY — DX: Personal history of malignant neoplasm, unspecified: Z85.9

## 2015-06-27 HISTORY — DX: Frequency of micturition: R35.0

## 2015-06-27 HISTORY — PX: CYSTOSCOPY/URETEROSCOPY/HOLMIUM LASER/STENT PLACEMENT: SHX6546

## 2015-06-27 HISTORY — DX: Urgency of urination: R39.15

## 2015-06-27 HISTORY — DX: Personal history of malignant melanoma of skin: Z85.820

## 2015-06-27 HISTORY — DX: Calculus of ureter: N20.1

## 2015-06-27 HISTORY — DX: Flank pain, right side: R10.A1

## 2015-06-27 HISTORY — DX: Unspecified abdominal pain: R10.9

## 2015-06-27 HISTORY — DX: Presence of spectacles and contact lenses: Z97.3

## 2015-06-27 HISTORY — DX: Constipation, unspecified: K59.00

## 2015-06-27 HISTORY — PX: HOLMIUM LASER APPLICATION: SHX5852

## 2015-06-27 HISTORY — DX: Spinal stenosis, lumbar region without neurogenic claudication: M48.061

## 2015-06-27 HISTORY — DX: Personal history of malignant melanoma of skin: Z98.890

## 2015-06-27 SURGERY — CYSTOSCOPY/URETEROSCOPY/HOLMIUM LASER/STENT PLACEMENT
Anesthesia: General | Laterality: Right

## 2015-06-27 MED ORDER — OXYCODONE HCL 5 MG PO TABS
5.0000 mg | ORAL_TABLET | ORAL | Status: DC | PRN
  Filled 2015-06-27: qty 2

## 2015-06-27 MED ORDER — SODIUM CHLORIDE 0.9 % IR SOLN
Status: DC | PRN
  Administered 2015-06-27: 2000 mL

## 2015-06-27 MED ORDER — ATROPINE SULFATE 0.4 MG/ML IJ SOLN
INTRAMUSCULAR | Status: AC
  Filled 2015-06-27: qty 1

## 2015-06-27 MED ORDER — BELLADONNA ALKALOIDS-OPIUM 16.2-60 MG RE SUPP
RECTAL | Status: AC
  Filled 2015-06-27: qty 1

## 2015-06-27 MED ORDER — SODIUM CHLORIDE 0.9 % IV SOLN
250.0000 mL | INTRAVENOUS | Status: DC | PRN
  Filled 2015-06-27: qty 250

## 2015-06-27 MED ORDER — ONDANSETRON HCL 4 MG/2ML IJ SOLN
4.0000 mg | Freq: Once | INTRAMUSCULAR | Status: AC | PRN
  Administered 2015-06-27: 4 mg via INTRAVENOUS
  Filled 2015-06-27: qty 2

## 2015-06-27 MED ORDER — LIDOCAINE HCL 2 % EX GEL
CUTANEOUS | Status: DC | PRN
  Administered 2015-06-27: 1 via URETHRAL

## 2015-06-27 MED ORDER — SODIUM CHLORIDE 0.9 % IJ SOLN
3.0000 mL | Freq: Two times a day (BID) | INTRAMUSCULAR | Status: DC
  Filled 2015-06-27: qty 3

## 2015-06-27 MED ORDER — PHENAZOPYRIDINE HCL 100 MG PO TABS
ORAL_TABLET | ORAL | Status: AC
  Filled 2015-06-27: qty 2

## 2015-06-27 MED ORDER — DEXAMETHASONE SODIUM PHOSPHATE 4 MG/ML IJ SOLN
INTRAMUSCULAR | Status: DC | PRN
  Administered 2015-06-27: 10 mg via INTRAVENOUS

## 2015-06-27 MED ORDER — BELLADONNA ALKALOIDS-OPIUM 16.2-60 MG RE SUPP
RECTAL | Status: DC | PRN
  Administered 2015-06-27: 1 via RECTAL

## 2015-06-27 MED ORDER — ONDANSETRON HCL 4 MG/2ML IJ SOLN
INTRAMUSCULAR | Status: AC
  Filled 2015-06-27: qty 2

## 2015-06-27 MED ORDER — PROPOFOL 10 MG/ML IV BOLUS
INTRAVENOUS | Status: AC
  Filled 2015-06-27: qty 40

## 2015-06-27 MED ORDER — PHENAZOPYRIDINE HCL 200 MG PO TABS
200.0000 mg | ORAL_TABLET | Freq: Once | ORAL | Status: AC
  Administered 2015-06-27: 200 mg via ORAL
  Filled 2015-06-27: qty 1

## 2015-06-27 MED ORDER — ACETAMINOPHEN 650 MG RE SUPP
650.0000 mg | RECTAL | Status: DC | PRN
  Filled 2015-06-27: qty 1

## 2015-06-27 MED ORDER — CEFAZOLIN SODIUM-DEXTROSE 2-3 GM-% IV SOLR
2.0000 g | INTRAVENOUS | Status: AC
  Administered 2015-06-27: 2 g via INTRAVENOUS
  Filled 2015-06-27: qty 50

## 2015-06-27 MED ORDER — KETOROLAC TROMETHAMINE 30 MG/ML IJ SOLN
INTRAMUSCULAR | Status: AC
  Filled 2015-06-27: qty 1

## 2015-06-27 MED ORDER — LIDOCAINE HCL (CARDIAC) 20 MG/ML IV SOLN
INTRAVENOUS | Status: DC | PRN
  Administered 2015-06-27: 100 mg via INTRAVENOUS

## 2015-06-27 MED ORDER — FENTANYL CITRATE (PF) 100 MCG/2ML IJ SOLN
25.0000 ug | INTRAMUSCULAR | Status: DC | PRN
  Filled 2015-06-27: qty 1

## 2015-06-27 MED ORDER — ACETAMINOPHEN 325 MG PO TABS
650.0000 mg | ORAL_TABLET | ORAL | Status: DC | PRN
  Filled 2015-06-27: qty 2

## 2015-06-27 MED ORDER — MIDAZOLAM HCL 2 MG/2ML IJ SOLN
INTRAMUSCULAR | Status: AC
  Filled 2015-06-27: qty 2

## 2015-06-27 MED ORDER — KETOROLAC TROMETHAMINE 30 MG/ML IJ SOLN
INTRAMUSCULAR | Status: DC | PRN
  Administered 2015-06-27: 30 mg via INTRAVENOUS

## 2015-06-27 MED ORDER — PROPOFOL 10 MG/ML IV BOLUS
INTRAVENOUS | Status: DC | PRN
  Administered 2015-06-27: 200 mg via INTRAVENOUS

## 2015-06-27 MED ORDER — SODIUM CHLORIDE 0.9 % IJ SOLN
3.0000 mL | INTRAMUSCULAR | Status: DC | PRN
  Filled 2015-06-27: qty 3

## 2015-06-27 MED ORDER — CEFAZOLIN SODIUM-DEXTROSE 2-3 GM-% IV SOLR
INTRAVENOUS | Status: AC
  Filled 2015-06-27: qty 50

## 2015-06-27 MED ORDER — IOHEXOL 350 MG/ML SOLN
INTRAVENOUS | Status: DC | PRN
  Administered 2015-06-27: 5 mL

## 2015-06-27 MED ORDER — FENTANYL CITRATE (PF) 100 MCG/2ML IJ SOLN
INTRAMUSCULAR | Status: DC | PRN
  Administered 2015-06-27 (×2): 25 ug via INTRAVENOUS

## 2015-06-27 MED ORDER — LACTATED RINGERS IV SOLN
INTRAVENOUS | Status: DC
  Administered 2015-06-27: 09:00:00 via INTRAVENOUS
  Filled 2015-06-27: qty 1000

## 2015-06-27 MED ORDER — FENTANYL CITRATE (PF) 100 MCG/2ML IJ SOLN
INTRAMUSCULAR | Status: AC
  Filled 2015-06-27: qty 4

## 2015-06-27 MED ORDER — DEXAMETHASONE SODIUM PHOSPHATE 10 MG/ML IJ SOLN
INTRAMUSCULAR | Status: AC
  Filled 2015-06-27: qty 1

## 2015-06-27 SURGICAL SUPPLY — 43 items
BAG DRAIN URO-CYSTO SKYTR STRL (DRAIN) ×3 IMPLANT
BAG DRN UROCATH (DRAIN) ×1
BASKET LASER NITINOL 1.9FR (BASKET) IMPLANT
BASKET STNLS GEMINI 4WIRE 3FR (BASKET) IMPLANT
BASKET ZERO TIP NITINOL 2.4FR (BASKET) ×2 IMPLANT
BSKT STON RTRVL 120 1.9FR (BASKET)
BSKT STON RTRVL GEM 120X11 3FR (BASKET)
BSKT STON RTRVL ZERO TP 2.4FR (BASKET) ×1
CANISTER SUCT LVC 12 LTR MEDI- (MISCELLANEOUS) IMPLANT
CATH URET 5FR 28IN CONE TIP (BALLOONS)
CATH URET 5FR 28IN OPEN ENDED (CATHETERS) IMPLANT
CATH URET 5FR 70CM CONE TIP (BALLOONS) IMPLANT
CLOTH BEACON ORANGE TIMEOUT ST (SAFETY) ×3 IMPLANT
ELECT REM PT RETURN 9FT ADLT (ELECTROSURGICAL)
ELECTRODE REM PT RTRN 9FT ADLT (ELECTROSURGICAL) IMPLANT
FIBER LASER FLEXIVA 1000 (UROLOGICAL SUPPLIES) IMPLANT
FIBER LASER FLEXIVA 365 (UROLOGICAL SUPPLIES) ×2 IMPLANT
FIBER LASER FLEXIVA 550 (UROLOGICAL SUPPLIES) IMPLANT
FIBER LASER TRAC TIP (UROLOGICAL SUPPLIES) IMPLANT
GLOVE BIO SURGEON STRL SZ 6.5 (GLOVE) ×1 IMPLANT
GLOVE BIO SURGEONS STRL SZ 6.5 (GLOVE) ×1
GLOVE BIOGEL PI IND STRL 6.5 (GLOVE) IMPLANT
GLOVE BIOGEL PI INDICATOR 6.5 (GLOVE) ×6
GLOVE SURG SS PI 6.5 STRL IVOR (GLOVE) ×2 IMPLANT
GLOVE SURG SS PI 8.0 STRL IVOR (GLOVE) ×3 IMPLANT
GOWN STRL REUS W/ TWL LRG LVL3 (GOWN DISPOSABLE) ×1 IMPLANT
GOWN STRL REUS W/ TWL XL LVL3 (GOWN DISPOSABLE) ×1 IMPLANT
GOWN STRL REUS W/TWL LRG LVL3 (GOWN DISPOSABLE) ×6
GOWN STRL REUS W/TWL XL LVL3 (GOWN DISPOSABLE) ×5 IMPLANT
GUIDEWIRE 0.038 PTFE COATED (WIRE) IMPLANT
GUIDEWIRE ANG ZIPWIRE 038X150 (WIRE) IMPLANT
GUIDEWIRE STR DUAL SENSOR (WIRE) ×3 IMPLANT
IV NS IRRIG 3000ML ARTHROMATIC (IV SOLUTION) ×6 IMPLANT
KIT BALLIN UROMAX 15FX10 (LABEL) IMPLANT
KIT BALLN UROMAX 15FX4 (MISCELLANEOUS) IMPLANT
KIT BALLN UROMAX 26 75X4 (MISCELLANEOUS)
KIT ROOM TURNOVER WOR (KITS) ×3 IMPLANT
MANIFOLD NEPTUNE II (INSTRUMENTS) ×2 IMPLANT
PACK CYSTO (CUSTOM PROCEDURE TRAY) ×3 IMPLANT
SET HIGH PRES BAL DIL (LABEL)
SHEATH ACCESS URETERAL 38CM (SHEATH) IMPLANT
TUBE CONNECTING 12'X1/4 (SUCTIONS)
TUBE CONNECTING 12X1/4 (SUCTIONS) IMPLANT

## 2015-06-27 NOTE — Anesthesia Postprocedure Evaluation (Signed)
Anesthesia Post Note  Patient: Frank Weber  Procedure(s) Performed: Procedure(s) (LRB): CYSTOSCOPY RIGHT URETEROSCOPY  STENT REMOVAL; STONE EXTRACTION WITH BASKET (Right) HOLMIUM LASER LITHOTRIPSY  (Right)  Patient location during evaluation: PACU Anesthesia Type: General Level of consciousness: awake and alert Pain management: pain level controlled Vital Signs Assessment: post-procedure vital signs reviewed and stable Respiratory status: spontaneous breathing, nonlabored ventilation, respiratory function stable and patient connected to nasal cannula oxygen Cardiovascular status: blood pressure returned to baseline and stable Postop Assessment: no signs of nausea or vomiting Anesthetic complications: no    Last Vitals:  Filed Vitals:   06/27/15 1000 06/27/15 1030  BP: 109/80 103/75  Pulse: 64 55  Temp: 36.7 C   Resp: 11 11    Last Pain:  Filed Vitals:   06/27/15 1036  PainSc: Asleep                 Roger Kettles JENNETTE

## 2015-06-27 NOTE — H&P (View-Only) (Signed)
Subjective: [name] is a 65 yo WM who I was asked to see in consultation by Dr. Eulis Foster for a right ureteral stone.   He has a prior history of stones and has been seen in our [place] office in the past.   He had the onset last week of mild right side pain but today he had the onset of severe right flank pain with nausea and vomiting.  The pain radiated to the groin and was associated with bowel and bladder urgency.  His pain was not controlled with dilaudid or toradol.  He has passed one previous stone and has no other GU history.   He does have a history of possible renal insufficiency and his Cr. Is elevated today.  ROS:  Review of Systems  Constitutional: Negative for fever and chills.  Respiratory: Positive for shortness of breath (mild).   Gastrointestinal: Positive for nausea, vomiting and abdominal pain (right flank and groin pain).  Genitourinary: Positive for urgency.  All other systems reviewed and are negative.   Allergies  Allergen Reactions  . Sulfa Antibiotics Shortness Of Breath and Rash  . Coconut Oil Rash    Past Medical History  Diagnosis Date  . IBS (irritable bowel syndrome)     states slight  . Hyperlipidemia   . Migraines   . Arthritis     right knee  . Peripheral neuropathy (HCC)     legs and feet  . GERD (gastroesophageal reflux disease)     no current med.  . Rotator cuff tear 07/2014    left  . Runny nose 07/20/2014    clear drainage  . Immature cataract   . Dental crowns present     also dental implants  . History of kidney stones   . History of cardiac murmur     states no problems; no cardiac testing since 2009  . History of stress test 06/2011    Showed normal myocardial perfusion.  Marland Kitchen Hx of echocardiogram 07/2006    notable for mild AI, otherwise it was fairly remarkable.  . Hypothyroidism   . Melanoma [facility])     Past Surgical History  Procedure Laterality Date  . Knee arthroscopy w/ acl reconstruction Right   . Knee arthroscopy  Right   . Rotator cuff repair Right   . Inguinal hernia repair Left 10/21/2007  . Tonsillectomy and adenoidectomy  1967  . Colonoscopy N/A 02/17/2013    Procedure: COLONOSCOPY;  Surgeon: Rogene Houston, MD;  Location: [facility];  Service: Endoscopy;  Laterality: N/A;  225  . Cardiac catheterization  06/2007    This revealed a mild 30% proximal stenosis but there was no other significant coronary artery disease. His EF was a low normal of approximately 50%.  . Shoulder arthroscopy with open rotator cuff repair Left 07/27/2014    Procedure: LEFT SHOULDER ARTHROSCOPY WITH MINI OPEN ROTATOR CUFF REPAIR;  Surgeon: [name], MD;  Location: [facility];  Service: Orthopedics;  Laterality: Left;  . Head & neck skin lesion excisional biopsy      Social History   Social History  . Marital Status: Married    Spouse Name: N/A  . Number of Children: N/A  . Years of Education: N/A   Occupational History  . Not on file.   Social History Main Topics  . Smoking status: Current Some Day Smoker -- 0.00 packs/day for 30 years    Types: Cigars  . Smokeless tobacco: Never Used  Comment: smokes an occasional cigar  . Alcohol Use: Yes     Comment: occasionally  . Drug Use: No  . Sexual Activity: Not on file   Other Topics Concern  . Not on file   Social History Narrative    Family History  Problem Relation Age of Onset  . Colon cancer Other     Age of Onset-late 87's    Anti-infectives: Anti-infectives    None      No current facility-administered medications for this encounter.   Current Outpatient Prescriptions  Medication Sig Dispense Refill  . DULoxetine (CYMBALTA) 30 MG capsule Take 30 mg by mouth at bedtime.      . gabapentin (NEURONTIN) 800 MG tablet Take 800 mg by mouth 3 (three) times daily.      Marland Kitchen levothyroxine (SYNTHROID, LEVOTHROID) 25 MCG tablet Take 25 mcg by mouth daily before breakfast.    . LORazepam (ATIVAN) 0.5 MG tablet Take 0.5 mg by  mouth at bedtime.      . Multiple Vitamin (MULTIVITAMIN) tablet Take 1 tablet by mouth daily.    Marland Kitchen omeprazole (PRILOSEC) 40 MG capsule Take 1 capsule (40 mg total) by mouth daily. 30 capsule 0  . oxyCODONE-acetaminophen (ROXICET) 5-325 MG per tablet Take 1 tablet by mouth every 4 (four) hours as needed. 60 tablet 0  . pravastatin (PRAVACHOL) 20 MG tablet Take 40 mg by mouth daily.     . Probiotic Product (ALIGN) 4 MG CAPS Take 4 mg by mouth daily.      Marland Kitchen topiramate (TOPAMAX) 25 MG capsule Take 75 mg by mouth at bedtime.     . rizatriptan (MAXALT-MLT) 10 MG disintegrating tablet Take 10 mg by mouth as needed. May repeat in 2 hours if needed MIGRAINE      PSFHx reviewed and updated.   Objective: Vital signs in last 24 hours: Temp:  [97.8 F (36.6 C)] 97.8 F (36.6 C) (01/08 1104) Pulse Rate:  [51-58] 54 (01/08 1702) Resp:  [16-20] 18 (01/08 1702) BP: (108-129)/(75-85) 112/78 mmHg (01/08 1702) SpO2:  [95 %-99 %] 98 % (01/08 1702) Weight:  [102.059 kg (225 lb)] 102.059 kg (225 lb) (01/08 1104)  Intake/Output from previous day:   Intake/Output this shift:     Physical Exam  Constitutional: He is oriented to person, place, and time and well-developed, well-nourished, and in no distress.  HENT:  Head: Normocephalic and atraumatic.  Neck: Normal range of motion. Neck supple. No thyromegaly present.  Cardiovascular: Normal rate, regular rhythm and normal heart sounds.   Pulmonary/Chest: Effort normal and breath sounds normal. No respiratory distress.  Abdominal: Soft. He exhibits no distension and no mass. There is tenderness (right upper quadrant and flank that is moderate. ). There is guarding. There is no rebound.  No hernias  Genitourinary:  Normal circumcised phallus with adequate meatus.   Normal scrotum and testes.   Musculoskeletal: Normal range of motion. He exhibits no edema or tenderness.  Lymphadenopathy:    He has no cervical adenopathy.  Neurological: He is alert and  oriented to person, place, and time.  But medicated  Skin: Skin is warm and dry.  Psychiatric: Mood and affect normal.  Vitals reviewed.   Lab Results:   Recent Labs  06/18/15 1122  WBC 9.9  HGB 17.5*  HCT 50.4  PLT 210   BMET  Recent Labs  06/18/15 1122  NA 140  K 4.1  CL 108  CO2 23  GLUCOSE 122*  BUN 14  CREATININE 1.62*  CALCIUM 9.5   PT/INR No results for input(s): LABPROT, INR in the last 72 hours. ABG No results for input(s): PHART, HCO3 in the last 72 hours.  Invalid input(s): PCO2, PO2  Studies/Results: Ct Renal Stone Study  06/18/2015  CLINICAL DATA:  Right flank pain radiating to the right growing for several hours. History of inflammatory bowel disease. EXAM: CT ABDOMEN AND PELVIS WITHOUT CONTRAST TECHNIQUE: Multidetector CT imaging of the abdomen and pelvis was performed following the standard protocol without IV contrast. COMPARISON:  CT of the abdomen pelvis dated 06/05/2011 FINDINGS: Lower chest:  Mild left lung base atelectasis versus scarring. Hepatobiliary: No mass visualized on this un-enhanced exam. Pancreas: No mass or inflammatory process identified on this un-enhanced exam. Spleen: Within normal limits in size. Adrenals/Urinary Tract: There is an 8 x 4 mm obstructing stone within the distal right ureter, approximately 5-6 cm superior to the vesicoureteral junction. It causes upstream hydroureter, moderate hydronephrosis and adjacent perinephric and periureteral inflammatory stranding. No evidence of left urolithiasis or hydronephrosis. No definite mass visualized on this un-enhanced exam. Stomach/Bowel: No evidence of obstruction, inflammatory process, or abnormal fluid collections. Extensive diverticulosis of the sigmoid colon without evidence of diverticulitis is seen. Vascular/Lymphatic: No pathologically enlarged lymph nodes. No evidence of abdominal aortic aneurysm. Mild tortuosity and atherosclerotic disease of the aorta. Reproductive: Coarse  calcifications of the prostate gland are seen. The gland itself is not pathologically enlarged. Other: Layering hyperdense material is seen within the dependent portion of the urinary bladder, with uncertain significance. Musculoskeletal:  No suspicious bone lesions identified. IMPRESSION: Obstructing 8 mm distal right ureteral stone, with moderate right hydroureter and hydronephrosis, and associated adjacent inflammatory changes. Layering hyperdense material within the urinary bladder with uncertain significance. Electronically Signed   By: Fidela Salisbury M.D.   On: 06/18/2015 14:00     Assessment: He has an 20mm right mid ureteral stone with intractable pain.     I am going to take him to the OR for cystoscopy with possible ureteroscopy and right ureteral stent insertion.  He will probably need a secondary procedure to manage the stone.    I reviewed the risks of bleeding, infection, injury to the ureter or adjacent structures, need for a stent or secondary procedures, thrombotic events and anesthetic complications.  He is on topiramate with is associated with an increased risk of stones and should probably find an alternative med.     CC: Dr. Christ Kick.      Alyene Predmore J 06/18/2015 [phone]

## 2015-06-27 NOTE — Interval H&P Note (Signed)
History and Physical Interval Note:  We will proceed with stone removal today.   06/27/2015 8:51 AM  Frank Weber  has presented today for surgery, with the diagnosis of RIGHT DISTAL STONE  The various methods of treatment have been discussed with the patient and family. After consideration of risks, benefits and other options for treatment, the patient has consented to  Procedure(s): CYSTOSCOPY RIGHT URETEROSCOPY  STENT PLACEMENT STONE EXTRACTION WITH BASKET (Right) HOLMIUM LASER LITHOTRIPSY  (Right) as a surgical intervention .  The patient's history has been reviewed, patient examined, no change in status, stable for surgery.  I have reviewed the patient's chart and labs.  Questions were answered to the patient's satisfaction.     Morene Cecilio J

## 2015-06-27 NOTE — Transfer of Care (Signed)
Immediate Anesthesia Transfer of Care Note  Patient: Frank Weber  Procedure(s) Performed: Procedure(s): CYSTOSCOPY RIGHT URETEROSCOPY  STENT REMOVAL; STONE EXTRACTION WITH BASKET (Right) HOLMIUM LASER LITHOTRIPSY  (Right)  Patient Location: PACU  Anesthesia Type:General  Level of Consciousness: awake, alert , oriented and patient cooperative  Airway & Oxygen Therapy: Patient Spontanous Breathing and Patient connected to nasal cannula oxygen  Post-op Assessment: Report given to RN and Post -op Vital signs reviewed and stable  Post vital signs: Reviewed and stable  Last Vitals:  Filed Vitals:   06/27/15 0800  BP: 115/75  Pulse: 55  Temp: 36.4 C  Resp: 16    Complications: No apparent anesthesia complications

## 2015-06-27 NOTE — Anesthesia Preprocedure Evaluation (Signed)
Anesthesia Evaluation  Patient identified by MRN, date of birth, ID band Patient awake    Reviewed: Allergy & Precautions, NPO status , Patient's Chart, lab work & pertinent test results  History of Anesthesia Complications Negative for: history of anesthetic complications  Airway Mallampati: II  TM Distance: >3 FB Neck ROM: Full    Dental no notable dental hx. (+) Dental Advisory Given   Pulmonary Current Smoker,    Pulmonary exam normal breath sounds clear to auscultation       Cardiovascular negative cardio ROS Normal cardiovascular exam Rhythm:Regular Rate:Normal     Neuro/Psych  Headaches, negative psych ROS   GI/Hepatic Neg liver ROS, GERD  Medicated and Controlled,  Endo/Other  Hypothyroidism   Renal/GU negative Renal ROS  negative genitourinary   Musculoskeletal  (+) Arthritis ,   Abdominal   Peds negative pediatric ROS (+)  Hematology negative hematology ROS (+)   Anesthesia Other Findings   Reproductive/Obstetrics negative OB ROS                             Anesthesia Physical Anesthesia Plan  ASA: II  Anesthesia Plan: General   Post-op Pain Management:    Induction: Intravenous  Airway Management Planned: LMA  Additional Equipment:   Intra-op Plan:   Post-operative Plan: Extubation in OR  Informed Consent: I have reviewed the patients History and Physical, chart, labs and discussed the procedure including the risks, benefits and alternatives for the proposed anesthesia with the patient or authorized representative who has indicated his/her understanding and acceptance.   Dental advisory given  Plan Discussed with: CRNA  Anesthesia Plan Comments:         Anesthesia Quick Evaluation

## 2015-06-27 NOTE — Brief Op Note (Signed)
06/27/2015  9:48 AM  PATIENT:  Frank Weber  65 y.o. male  PRE-OPERATIVE DIAGNOSIS:  RIGHT DISTAL STONE  POST-OPERATIVE DIAGNOSIS:  RIGHT DISTAL STONE  PROCEDURE:  Procedure(s): CYSTOSCOPY RIGHT URETEROSCOPY  STENT REMOVAL; STONE EXTRACTION WITH BASKET (Right) HOLMIUM LASER LITHOTRIPSY  (Right)  SURGEON:  Surgeon(s) and Role:    * [name], MD - Primary  PHYSICIAN ASSISTANT:   ASSISTANTS: none   ANESTHESIA:   general  EBL:     BLOOD ADMINISTERED:none  DRAINS: none   LOCAL MEDICATIONS USED:  LIDOCAINE  and Amount: 10 ml 2% jelly  SPECIMEN:  Source of Specimen:  right ureteral stone  DISPOSITION OF SPECIMEN:  to family to bring to the office  COUNTS:  YES  TOURNIQUET:  * No tourniquets in log *  DICTATION: .Other Dictation: Dictation Number [id number]  PLAN OF CARE: Discharge to home after PACU  PATIENT DISPOSITION:  PACU - hemodynamically stable.   Delay start of Pharmacological VTE agent (>24hrs) due to surgical blood loss or risk of bleeding: not applicable

## 2015-06-27 NOTE — Discharge Instructions (Signed)

## 2015-06-27 NOTE — Anesthesia Procedure Notes (Addendum)
Procedure Name: LMA Insertion Date/Time: 06/27/2015 9:27 AM Performed by: Wanita Chamberlain Pre-anesthesia Checklist: Patient identified, Timeout performed, Emergency Drugs available, Suction available and Patient being monitored Patient Re-evaluated:Patient Re-evaluated prior to inductionOxygen Delivery Method: Circle system utilized Preoxygenation: Pre-oxygenation with 100% oxygen Intubation Type: IV induction LMA: LMA inserted LMA Size: 5.0 Number of attempts: 1 Placement Confirmation: positive ETCO2 and breath sounds checked- equal and bilateral Tube secured with: Tape Dental Injury: Teeth and Oropharynx as per pre-operative assessment

## 2015-06-28 ENCOUNTER — Encounter (HOSPITAL_BASED_OUTPATIENT_CLINIC_OR_DEPARTMENT_OTHER): Payer: Self-pay | Admitting: Urology

## 2015-06-28 NOTE — Op Note (Signed)
NAME:  Frank Weber, Frank Weber                     ACCOUNT NO.:  MEDICAL RECORD NO.:  [id number]  LOCATION:                                 FACILITY:  PHYSICIAN:  Marshall Cork. Jeffie Pollock, M.D.    DATE OF BIRTH:  [birth year]  DATE OF PROCEDURE:  06/27/2015 DATE OF DISCHARGE:                              OPERATIVE REPORT   PROCEDURE: 1. Cystoscopy with removal of right double-J stent. 2. Right ureteroscopic stone extraction with holmium lasertripsy.  PREOPERATIVE DIAGNOSIS:  Right distal ureteral stone.  POSTOPERATIVE DIAGNOSIS:  Right distal ureteral stone.  SURGEON:  Marshall Cork. Jeffie Pollock, M.D.  ANESTHESIA:  General.  SPECIMEN:  Stone.  DRAINS:  None.  BLOOD LOSS:  None.  COMPLICATIONS:  None.  INDICATIONS:  Mr. Below is a 65 year old, white male, who had placement of a right double-J stent last week at [facility] for an obstructing right distal stone.  He returns now for definitive ureteroscopy.  FINDINGS OF PROCEDURE:  He was taken to the operating room where he was given 2 g of Ancef.  A general anesthetic was induced.  He was placed in lithotomy position.  He was fitted with PAS hose.  His perineum and genitalia were prepped with Betadine solution.  He was draped in usual sterile fashion.  Cystoscopy was performed using a 23-French scope and 30-degree lens. Examination revealed a normal urethra.  The external sphincter was intact.  The prostatic urethra was short without obstruction. Examination of bladder revealed smooth wall without tumors or stones. The left ureteral orifice was unremarkable.  The right ureteral orifice had a stent loop at the meatus with a surrounding edema.  The stent was grasped with grasping forceps and pulled the urethral meatus.  A guidewire was then passed through the stent to the kidney. The stent was then removed.  There was some resistance as the stone engaged with the stent as I was removing it, but I was able to get the stent out.  I then inserted a  6.5-French semirigid ureteroscope and easily passed it up the ureter to the stone.  The stone appeared too large to remove intact, so a 365 micron laser fiber was inserted it, was set on 0.5 watts and 20 hertz, and the stone was broken into manageable fragments.  The fragments were then removed with a Nitinol basket to the bladder.  At this point, the ureteroscope was passed proximally to the proximal ureter.  No additional stones were noted.  There was felt to be adequate dilation from the prior stent.  No significant ureteral irritation to need a replacing of stent.  The guidewire was then removed.  The cystoscope was reinserted.  The stone fragments removed from the bladder.  The bladder was drained and the cystoscope was removed.  The patient's urethra was instilled with 10 mL of 2% lidocaine jelly and B and O suppository was placed.  He was taken down from lithotomy position.  His anesthetic was reversed.  He was moved to recovery in stable condition.  There were no complications.     Marshall Cork. Jeffie Pollock, M.D.     JJW/MEDQ  D:  06/27/2015  T:  06/27/2015  Job:  [id number]

## 2015-07-14 ENCOUNTER — Ambulatory Visit (INDEPENDENT_AMBULATORY_CARE_PROVIDER_SITE_OTHER): Payer: PRIVATE HEALTH INSURANCE | Admitting: Urology

## 2015-07-14 DIAGNOSIS — N289 Disorder of kidney and ureter, unspecified: Secondary | ICD-10-CM

## 2015-07-14 DIAGNOSIS — N201 Calculus of ureter: Secondary | ICD-10-CM | POA: Diagnosis not present

## 2015-10-10 ENCOUNTER — Ambulatory Visit (HOSPITAL_COMMUNITY)
Admission: RE | Admit: 2015-10-10 | Discharge: 2015-10-10 | Disposition: A | Discharge status: Home / Self Care | Payer: PRIVATE HEALTH INSURANCE | Source: Ambulatory Visit | Attending: Physician Assistant | Admitting: Physician Assistant

## 2015-10-10 ENCOUNTER — Other Ambulatory Visit (HOSPITAL_COMMUNITY): Payer: Self-pay | Admitting: Physician Assistant

## 2015-10-10 DIAGNOSIS — R001 Bradycardia, unspecified: Secondary | ICD-10-CM | POA: Insufficient documentation

## 2015-11-27 ENCOUNTER — Other Ambulatory Visit (HOSPITAL_COMMUNITY): Payer: Self-pay | Admitting: Nephrology

## 2015-11-27 DIAGNOSIS — N183 Chronic kidney disease, stage 3 unspecified: Secondary | ICD-10-CM

## 2015-12-07 ENCOUNTER — Ambulatory Visit (HOSPITAL_COMMUNITY)
Admission: RE | Admit: 2015-12-07 | Discharge: 2015-12-07 | Disposition: A | Discharge status: Home / Self Care | Payer: PRIVATE HEALTH INSURANCE | Source: Ambulatory Visit | Attending: Nephrology | Admitting: Nephrology

## 2015-12-07 DIAGNOSIS — N183 Chronic kidney disease, stage 3 unspecified: Secondary | ICD-10-CM

## 2016-07-10 ENCOUNTER — Encounter: Payer: Self-pay | Admitting: Internal Medicine

## 2016-08-07 ENCOUNTER — Other Ambulatory Visit: Payer: Self-pay | Admitting: Urology

## 2016-08-07 ENCOUNTER — Ambulatory Visit (HOSPITAL_COMMUNITY)
Admission: RE | Admit: 2016-08-07 | Discharge: 2016-08-07 | Disposition: A | Discharge status: Home / Self Care | Payer: PRIVATE HEALTH INSURANCE | Source: Ambulatory Visit | Attending: Urology | Admitting: Urology

## 2016-08-07 DIAGNOSIS — N201 Calculus of ureter: Secondary | ICD-10-CM | POA: Insufficient documentation

## 2016-08-09 ENCOUNTER — Ambulatory Visit: Payer: Self-pay | Admitting: Urology

## 2016-08-09 ENCOUNTER — Ambulatory Visit (INDEPENDENT_AMBULATORY_CARE_PROVIDER_SITE_OTHER): Payer: PRIVATE HEALTH INSURANCE | Admitting: Urology

## 2016-08-09 DIAGNOSIS — N401 Enlarged prostate with lower urinary tract symptoms: Secondary | ICD-10-CM

## 2016-08-09 DIAGNOSIS — R3911 Hesitancy of micturition: Secondary | ICD-10-CM | POA: Diagnosis not present

## 2016-08-09 DIAGNOSIS — Z87442 Personal history of urinary calculi: Secondary | ICD-10-CM

## 2017-02-20 DIAGNOSIS — N289 Disorder of kidney and ureter, unspecified: Secondary | ICD-10-CM | POA: Diagnosis not present

## 2017-02-20 DIAGNOSIS — Z79899 Other long term (current) drug therapy: Secondary | ICD-10-CM | POA: Diagnosis not present

## 2017-02-20 DIAGNOSIS — G608 Other hereditary and idiopathic neuropathies: Secondary | ICD-10-CM | POA: Diagnosis not present

## 2017-03-29 ENCOUNTER — Emergency Department (HOSPITAL_COMMUNITY)
Admission: EM | Admit: 2017-03-29 | Discharge: 2017-03-29 | Disposition: A | Discharge status: Home / Self Care | Payer: Medicare Other | Attending: Emergency Medicine | Admitting: Emergency Medicine

## 2017-03-29 ENCOUNTER — Encounter (HOSPITAL_COMMUNITY): Payer: Self-pay | Admitting: Emergency Medicine

## 2017-03-29 DIAGNOSIS — Z2914 Encounter for prophylactic rabies immune globin: Secondary | ICD-10-CM | POA: Diagnosis not present

## 2017-03-29 DIAGNOSIS — E785 Hyperlipidemia, unspecified: Secondary | ICD-10-CM | POA: Diagnosis not present

## 2017-03-29 DIAGNOSIS — F17290 Nicotine dependence, other tobacco product, uncomplicated: Secondary | ICD-10-CM | POA: Diagnosis not present

## 2017-03-29 DIAGNOSIS — Y998 Other external cause status: Secondary | ICD-10-CM | POA: Diagnosis not present

## 2017-03-29 DIAGNOSIS — Z79899 Other long term (current) drug therapy: Secondary | ICD-10-CM | POA: Insufficient documentation

## 2017-03-29 DIAGNOSIS — W5501XA Bitten by cat, initial encounter: Secondary | ICD-10-CM | POA: Insufficient documentation

## 2017-03-29 DIAGNOSIS — Y929 Unspecified place or not applicable: Secondary | ICD-10-CM | POA: Diagnosis not present

## 2017-03-29 DIAGNOSIS — E039 Hypothyroidism, unspecified: Secondary | ICD-10-CM | POA: Insufficient documentation

## 2017-03-29 DIAGNOSIS — S61451A Open bite of right hand, initial encounter: Secondary | ICD-10-CM | POA: Diagnosis not present

## 2017-03-29 DIAGNOSIS — Z203 Contact with and (suspected) exposure to rabies: Secondary | ICD-10-CM | POA: Diagnosis not present

## 2017-03-29 DIAGNOSIS — Z23 Encounter for immunization: Secondary | ICD-10-CM | POA: Insufficient documentation

## 2017-03-29 DIAGNOSIS — Y9389 Activity, other specified: Secondary | ICD-10-CM | POA: Diagnosis not present

## 2017-03-29 MED ORDER — RABIES IMMUNE GLOBULIN 150 UNIT/ML IM INJ
INJECTION | INTRAMUSCULAR | Status: AC
  Filled 2017-03-29: qty 10

## 2017-03-29 MED ORDER — TETANUS-DIPHTH-ACELL PERTUSSIS 5-2.5-18.5 LF-MCG/0.5 IM SUSP
0.5000 mL | Freq: Once | INTRAMUSCULAR | Status: AC
  Administered 2017-03-29: 0.5 mL via INTRAMUSCULAR
  Filled 2017-03-29: qty 0.5

## 2017-03-29 MED ORDER — AMOXICILLIN-POT CLAVULANATE 875-125 MG PO TABS: 1.0000 | ORAL_TABLET | Freq: Two times a day (BID) | ORAL | 0 refills | Status: DC

## 2017-03-29 MED ORDER — AMOXICILLIN-POT CLAVULANATE 875-125 MG PO TABS
1.0000 | ORAL_TABLET | Freq: Once | ORAL | Status: AC
  Administered 2017-03-29: 1 via ORAL
  Filled 2017-03-29: qty 1

## 2017-03-29 MED ORDER — RABIES IMMUNE GLOBULIN 150 UNIT/ML IM INJ
20.0000 [IU]/kg | INJECTION | Freq: Once | INTRAMUSCULAR | Status: AC
  Administered 2017-03-29: 1875 [IU] via INTRAMUSCULAR
  Filled 2017-03-29: qty 12.5

## 2017-03-29 MED ORDER — RABIES IMMUNE GLOBULIN 150 UNIT/ML IM INJ
INJECTION | INTRAMUSCULAR | Status: AC
  Filled 2017-03-29: qty 2

## 2017-03-29 MED ORDER — RABIES VACCINE, PCEC IM SUSR
1.0000 mL | Freq: Once | INTRAMUSCULAR | Status: AC
  Administered 2017-03-29: 1 mL via INTRAMUSCULAR
  Filled 2017-03-29: qty 1

## 2017-03-29 NOTE — Discharge Instructions (Signed)
The speciality clinic should contact you on Monday to schedule remaining rabies vaccines.  Keep the wounds clean with mild soap and water and keep bandaged.  Return here for any signs of infection such as increasing pain, redness, and red streaks.  Take the antibiotic as directed until its finished,.

## 2017-03-29 NOTE — ED Provider Notes (Signed)
[facility] EMERGENCY DEPARTMENT Provider Note   CSN: [id number] Arrival date & time: 03/29/17  1804     History   Chief Complaint Chief Complaint  Patient presents with  . Animal Bite    HPI Frank Weber is a 66 y.o. male.  HPI  Frank Weber is a 66 y.o. male who presents to the Emergency Department complaining of being bitten by a stay kitten this afternoon. States that he noticed two stray kittens on his property today that both appeared sick.  Both kitten died, but one bit him on the right hand as he was trying to feed it.  He has washed the wounds with tap water.  He complains of "soreness" to his right hand, but denies numbness, swelling, or difficulty with movement.  Last tetanus greater than 5 years.  He has already contacted [facility] and filed a report.    Past Medical History:  Diagnosis Date  . Arthritis    right knee  . Constipation   . Frequency of urination   . GERD (gastroesophageal reflux disease)   . History of kidney stones   . History of melanoma excision    2012--  NECK/ HEAD  . History of squamous cell carcinoma excision    RIGHT EAR  . Hyperlipidemia   . Hypothyroidism   . IBS (irritable bowel syndrome)    states slight  . Lumbar stenosis    L5 - S1  . Migraines   . Peripheral neuropathy    legs and feet  . Right flank pain   . Right ureteral stone   . Urgency of urination   . Wears glasses     Patient Active Problem List   Diagnosis Date Noted  . Right ureteral stone 06/18/2015  . Unspecified hereditary and idiopathic peripheral neuropathy 01/27/2013  . IBS (irritable bowel syndrome) 01/27/2013  . GERD (gastroesophageal reflux disease) 01/27/2013  . History of migraine headaches 01/27/2013    Past Surgical History:  Procedure Laterality Date  . CARDIAC CATHETERIZATION  07-08-2007  dr Tami Ribas    no significant CAD, preserved LVF, ef 50%//  30% pLAD  . CARDIOVASCULAR STRESS TEST  06-20-2011  dr croitoru   Low risk study/  normal perfusion scan showing attenuation artifact in the anterior region of myocardium with no ischemia , infarct or scar/  normal LV funciton and wall motion , ef 62%  . COLONOSCOPY N/A 02/17/2013   Procedure: COLONOSCOPY;  Surgeon: Rogene Houston, MD;  [facility];  Service: Endoscopy;  Laterality: N/A;  225  . CYSTOSCOPY W/ URETERAL STENT PLACEMENT Right 06/18/2015   Procedure: CYSTOSCOPY WITH RIGHT RETROGRADE PYELOGRAM RIGHT URETERAL STENT PLACEMENT;  Surgeon: [name], MD;  Location: [facility];  Service: Urology;  Laterality: Right;  . CYSTOSCOPY/URETEROSCOPY/HOLMIUM LASER/STENT PLACEMENT Right 06/27/2015   Procedure: CYSTOSCOPY RIGHT URETEROSCOPY  STENT REMOVAL; STONE EXTRACTION WITH BASKET;  Surgeon: [name], MD;  Location: [facility];  Service: Urology;  Laterality: Right;  . HEAD & NECK SKIN LESION EXCISIONAL BIOPSY    . HOLMIUM LASER APPLICATION Right 2/68/3419   Procedure: HOLMIUM LASER LITHOTRIPSY ;  Surgeon: [name], MD;  Location: [facility];  Service: Urology;  Laterality: Right;  . INGUINAL HERNIA REPAIR Left 10/21/2007  . KNEE ARTHROSCOPY W/ ACL RECONSTRUCTION Right 1992  . SHOULDER ARTHROSCOPY WITH OPEN ROTATOR CUFF REPAIR Left 07/27/2014   Procedure: LEFT SHOULDER ARTHROSCOPY WITH MINI OPEN ROTATOR CUFF REPAIR;  Surgeon: [name],  MD;  Location: [place];  Service: Orthopedics;  Laterality: Left;  . SHOULDER ARTHROSCOPY WITH OPEN ROTATOR CUFF REPAIR Right 2006  . TONSILLECTOMY AND ADENOIDECTOMY  age 46  . TRANSTHORACIC ECHOCARDIOGRAM  07-30-2007   normal LVF, ef >55%/  mild AR, MR , PR and TR/  mild AV sclerosis without stenosis/   . URETEROSCOPY Right 06/18/2015   Procedure: URETEROSCOPY;  Surgeon: [name], MD;  Location: [facility];  Service: Urology;  Laterality: Right;       Home Medications    Prior to Admission medications   Medication Sig Start Date End Date Taking? Authorizing  Provider  cephALEXin (KEFLEX) 500 MG capsule Take 1 capsule (500 mg total) by mouth 4 (four) times daily. 06/18/15   [name], MD  DULoxetine (CYMBALTA) 30 MG capsule Take 30 mg by mouth at bedtime.      [provider]  gabapentin (NEURONTIN) 800 MG tablet Take 800 mg by mouth 3 (three) times daily.      [provider]  levothyroxine (SYNTHROID, LEVOTHROID) 25 MCG tablet Take 25 mcg by mouth daily before breakfast.    [provider]  LORazepam (ATIVAN) 0.5 MG tablet Take 0.5 mg by mouth at bedtime.      [provider]  oxyCODONE-acetaminophen (ROXICET) 5-325 MG tablet Take 1 tablet by mouth every 4 (four) hours as needed for moderate pain. 06/18/15   [name], MD  phenazopyridine (PYRIDIUM) 200 MG tablet Take 1 tablet (200 mg total) by mouth 3 (three) times daily as needed for pain. 06/18/15   [name], MD  pravastatin (PRAVACHOL) 40 MG tablet Take 40 mg by mouth every evening.    [provider]  Probiotic Product (ALIGN) 4 MG CAPS Take 4 mg by mouth daily.      [provider]  promethazine (PHENERGAN) 25 MG tablet Take 1 tablet (25 mg total) by mouth every 6 (six) hours as needed for nausea or vomiting. 06/18/15   [name], MD  rizatriptan (MAXALT-MLT) 10 MG disintegrating tablet Take 10 mg by mouth as needed. May repeat in 2 hours if needed MIGRAINE     [provider]  Sodium Bicarbonate-Citric Acid (ALKA-SELTZER HEARTBURN PO) Take by mouth as needed.    [provider]  topiramate (TOPAMAX) 25 MG capsule Take 75 mg by mouth at bedtime.     [provider]    Family History Family History  Problem Relation Age of Onset  . Colon cancer Other        Age of Onset-late 83's    Social History Social History  Substance Use Topics  . Smoking status: Current Some Day Smoker    Packs/day: 0.00    Years: 30.00    Types: Cigars  . Smokeless tobacco: Never Used     Comment: smokes an occasional cigar  .  Alcohol use Yes     Comment: occasionally     Allergies   Sulfa antibiotics and Coconut oil   Review of Systems Review of Systems  Constitutional: Negative for chills and fever.  Musculoskeletal: Positive for myalgias. Negative for arthralgias, back pain and joint swelling.  Skin: Positive for wound (cat bite to right hand).  Neurological: Negative for dizziness, weakness and numbness.  Hematological: Does not bruise/bleed easily.  All other systems reviewed and are negative.    Physical Exam Updated Vital Signs BP (!) 142/91 (BP Location: Left Arm)   Pulse 100   Temp 98.5 F (36.9 C) (Oral)  Resp 16   Ht 6\' 3"  (1.905 m)   Wt 95.3 kg (210 lb)   SpO2 100%   BMI 26.25 kg/m   Physical Exam  Constitutional: He is oriented to person, place, and time. He appears well-developed and well-nourished. No distress.  HENT:  Head: Normocephalic and atraumatic.  Cardiovascular: Normal rate, regular rhythm and intact distal pulses.   No murmur heard. Pulmonary/Chest: Effort normal and breath sounds normal. No respiratory distress.  Musculoskeletal: Normal range of motion. He exhibits tenderness. He exhibits no edema.       Right hand: He exhibits tenderness. He exhibits no bony tenderness, normal two-point discrimination, normal capillary refill and no swelling. Normal sensation noted. Normal strength noted. He exhibits no finger abduction, no thumb/finger opposition and no wrist extension trouble.       Hands: Two small puncture wounds to the proximal right thumb.  No edema.  No bleeding.    Neurological: He is alert and oriented to person, place, and time. No sensory deficit. He exhibits normal muscle tone. Coordination normal.  Skin: Skin is warm. Capillary refill takes less than 2 seconds.  Nursing note and vitals reviewed.    ED Treatments / Results  Labs (all labs ordered are listed, but only abnormal results are displayed) Labs Reviewed - No data to display  EKG  EKG  Interpretation None       Radiology No results found.  Procedures Procedures (including critical care time)  Medications Ordered in ED Medications  Tdap (BOOSTRIX) injection 0.5 mL (not administered)  amoxicillin-clavulanate (AUGMENTIN) 875-125 MG per tablet 1 tablet (not administered)  rabies immune globulin (HYPERAB/KEDRAB) injection 1,875 Units (not administered)  rabies vaccine (RABAVERT) injection 1 mL (not administered)     Initial Impression / Assessment and Plan / ED Course  I have reviewed the triage vital signs and the nursing notes.  Pertinent labs & imaging results that were available during my care of the patient were reviewed by me and considered in my medical decision making (see chart for details).     Pt contacted the [facility] prior to ER arrival to report the bite.  Larina Earthly is dead, was taken to local vet and will be sent for rabies testing.    Puncture wounds to the right hand were irrigated with saline by me and bandaged applied.    Td updated, rabies protocol initiated and Augmentin.    Pt stable for d/c, agrees to wound care instructions and return precautions.  Schedule given to patient for remaining vaccines through the speciality clinic   Final Clinical Impressions(s) / ED Diagnoses   Final diagnoses:  Cat bite, initial encounter    New Prescriptions New Prescriptions   No medications on file     Bufford Lope 03/29/17 1936    MesnerCorene Cornea, MD 03/31/17 1735

## 2017-03-29 NOTE — ED Triage Notes (Signed)
Pt reports having a stray kitten bite him on the right hand this afternoon.  The kitten died shortly after this.  Animal control told to come here.  Aching in right hand.

## 2017-04-01 ENCOUNTER — Encounter (HOSPITAL_COMMUNITY)
Admission: RE | Admit: 2017-04-01 | Discharge: 2017-04-01 | Disposition: A | Discharge status: Home / Self Care | Payer: Medicare Other | Source: Ambulatory Visit | Attending: Emergency Medicine | Admitting: Emergency Medicine

## 2017-04-01 DIAGNOSIS — Z203 Contact with and (suspected) exposure to rabies: Secondary | ICD-10-CM | POA: Diagnosis not present

## 2017-04-01 DIAGNOSIS — W5501XA Bitten by cat, initial encounter: Secondary | ICD-10-CM | POA: Insufficient documentation

## 2017-04-01 DIAGNOSIS — S61051A Open bite of right thumb without damage to nail, initial encounter: Secondary | ICD-10-CM | POA: Diagnosis not present

## 2017-04-01 DIAGNOSIS — Z2914 Encounter for prophylactic rabies immune globin: Secondary | ICD-10-CM | POA: Insufficient documentation

## 2017-04-01 DIAGNOSIS — Z23 Encounter for immunization: Secondary | ICD-10-CM | POA: Diagnosis not present

## 2017-04-01 DIAGNOSIS — S61031A Puncture wound without foreign body of right thumb without damage to nail, initial encounter: Secondary | ICD-10-CM | POA: Insufficient documentation

## 2017-04-01 MED ORDER — RABIES VACCINE, PCEC IM SUSR
INTRAMUSCULAR | Status: AC
  Filled 2017-04-01: qty 1

## 2017-04-01 MED ORDER — RABIES VACCINE, PCEC IM SUSR
1.0000 mL | Freq: Once | INTRAMUSCULAR | Status: AC
  Administered 2017-04-01: 1 mL via INTRAMUSCULAR

## 2017-04-05 ENCOUNTER — Encounter (HOSPITAL_COMMUNITY)
Admission: RE | Admit: 2017-04-05 | Discharge: 2017-04-05 | Disposition: A | Discharge status: Home / Self Care | Payer: Medicare Other | Source: Ambulatory Visit | Attending: Emergency Medicine | Admitting: Emergency Medicine

## 2017-04-05 DIAGNOSIS — S61051A Open bite of right thumb without damage to nail, initial encounter: Secondary | ICD-10-CM | POA: Diagnosis not present

## 2017-04-05 DIAGNOSIS — Z23 Encounter for immunization: Secondary | ICD-10-CM | POA: Diagnosis not present

## 2017-04-05 DIAGNOSIS — S61031A Puncture wound without foreign body of right thumb without damage to nail, initial encounter: Secondary | ICD-10-CM | POA: Diagnosis not present

## 2017-04-05 MED ORDER — RABIES VACCINE, PCEC IM SUSR
1.0000 mL | Freq: Once | INTRAMUSCULAR | Status: AC
  Administered 2017-04-05: 1 mL via INTRAMUSCULAR
  Filled 2017-04-05: qty 1

## 2017-04-05 NOTE — Progress Notes (Signed)
Patient presented to floor as an established patient, needing Rabavert IM injection. Injection administered. All patient questions and concerns addressed and answered. No further complaints at this time. Patient discharged.   Celestia Khat, RN

## 2017-04-12 ENCOUNTER — Encounter (HOSPITAL_COMMUNITY)
Admission: RE | Admit: 2017-04-12 | Discharge: 2017-04-12 | Disposition: A | Discharge status: Home / Self Care | Payer: Medicare Other | Source: Ambulatory Visit | Attending: Emergency Medicine | Admitting: Emergency Medicine

## 2017-04-12 DIAGNOSIS — W5501XA Bitten by cat, initial encounter: Secondary | ICD-10-CM | POA: Insufficient documentation

## 2017-04-12 DIAGNOSIS — Z203 Contact with and (suspected) exposure to rabies: Secondary | ICD-10-CM | POA: Insufficient documentation

## 2017-04-12 DIAGNOSIS — Z2914 Encounter for prophylactic rabies immune globin: Secondary | ICD-10-CM | POA: Insufficient documentation

## 2017-04-12 DIAGNOSIS — Z23 Encounter for immunization: Secondary | ICD-10-CM | POA: Insufficient documentation

## 2017-04-12 DIAGNOSIS — S61031A Puncture wound without foreign body of right thumb without damage to nail, initial encounter: Secondary | ICD-10-CM | POA: Diagnosis not present

## 2017-04-12 MED ORDER — RABIES VACCINE, PCEC IM SUSR
1.0000 mL | Freq: Once | INTRAMUSCULAR | Status: AC
  Administered 2017-04-12: 1 mL via INTRAMUSCULAR

## 2017-04-24 DIAGNOSIS — R946 Abnormal results of thyroid function studies: Secondary | ICD-10-CM | POA: Diagnosis not present

## 2017-04-24 DIAGNOSIS — M7071 Other bursitis of hip, right hip: Secondary | ICD-10-CM | POA: Diagnosis not present

## 2017-04-24 DIAGNOSIS — Z125 Encounter for screening for malignant neoplasm of prostate: Secondary | ICD-10-CM | POA: Diagnosis not present

## 2017-04-24 DIAGNOSIS — Z23 Encounter for immunization: Secondary | ICD-10-CM | POA: Diagnosis not present

## 2017-04-24 DIAGNOSIS — E063 Autoimmune thyroiditis: Secondary | ICD-10-CM | POA: Diagnosis not present

## 2017-04-24 DIAGNOSIS — E782 Mixed hyperlipidemia: Secondary | ICD-10-CM | POA: Diagnosis not present

## 2017-04-24 DIAGNOSIS — Z1389 Encounter for screening for other disorder: Secondary | ICD-10-CM | POA: Diagnosis not present

## 2017-04-24 DIAGNOSIS — Z6827 Body mass index (BMI) 27.0-27.9, adult: Secondary | ICD-10-CM | POA: Diagnosis not present

## 2017-04-24 DIAGNOSIS — E663 Overweight: Secondary | ICD-10-CM | POA: Diagnosis not present

## 2017-04-24 DIAGNOSIS — F419 Anxiety disorder, unspecified: Secondary | ICD-10-CM | POA: Diagnosis not present

## 2017-04-24 DIAGNOSIS — E785 Hyperlipidemia, unspecified: Secondary | ICD-10-CM | POA: Diagnosis not present

## 2017-04-25 ENCOUNTER — Other Ambulatory Visit (HOSPITAL_COMMUNITY): Payer: Self-pay | Admitting: Internal Medicine

## 2017-04-25 ENCOUNTER — Ambulatory Visit (HOSPITAL_COMMUNITY)
Admission: RE | Admit: 2017-04-25 | Discharge: 2017-04-25 | Disposition: A | Discharge status: Home / Self Care | Payer: Medicare Other | Source: Ambulatory Visit | Attending: Internal Medicine | Admitting: Internal Medicine

## 2017-04-25 DIAGNOSIS — S3993XA Unspecified injury of pelvis, initial encounter: Secondary | ICD-10-CM | POA: Insufficient documentation

## 2017-04-25 DIAGNOSIS — X58XXXA Exposure to other specified factors, initial encounter: Secondary | ICD-10-CM | POA: Insufficient documentation

## 2017-04-25 DIAGNOSIS — R102 Pelvic and perineal pain: Secondary | ICD-10-CM | POA: Diagnosis not present

## 2017-04-25 DIAGNOSIS — N420 Calculus of prostate: Secondary | ICD-10-CM | POA: Diagnosis not present

## 2017-05-22 DIAGNOSIS — Z85828 Personal history of other malignant neoplasm of skin: Secondary | ICD-10-CM | POA: Diagnosis not present

## 2017-05-22 DIAGNOSIS — D485 Neoplasm of uncertain behavior of skin: Secondary | ICD-10-CM | POA: Diagnosis not present

## 2017-05-22 DIAGNOSIS — L570 Actinic keratosis: Secondary | ICD-10-CM | POA: Diagnosis not present

## 2017-05-22 DIAGNOSIS — D0430 Carcinoma in situ of skin of unspecified part of face: Secondary | ICD-10-CM | POA: Diagnosis not present

## 2017-05-22 DIAGNOSIS — D0439 Carcinoma in situ of skin of other parts of face: Secondary | ICD-10-CM | POA: Diagnosis not present

## 2017-05-22 DIAGNOSIS — L820 Inflamed seborrheic keratosis: Secondary | ICD-10-CM | POA: Diagnosis not present

## 2017-05-22 DIAGNOSIS — L821 Other seborrheic keratosis: Secondary | ICD-10-CM | POA: Diagnosis not present

## 2017-05-22 DIAGNOSIS — Z85820 Personal history of malignant melanoma of skin: Secondary | ICD-10-CM | POA: Diagnosis not present

## 2017-06-11 DIAGNOSIS — Z85828 Personal history of other malignant neoplasm of skin: Secondary | ICD-10-CM | POA: Diagnosis not present

## 2017-06-11 DIAGNOSIS — D0422 Carcinoma in situ of skin of left ear and external auricular canal: Secondary | ICD-10-CM | POA: Diagnosis not present

## 2017-09-08 DIAGNOSIS — G629 Polyneuropathy, unspecified: Secondary | ICD-10-CM | POA: Diagnosis not present

## 2017-09-08 DIAGNOSIS — G9009 Other idiopathic peripheral autonomic neuropathy: Secondary | ICD-10-CM | POA: Diagnosis not present

## 2017-09-08 DIAGNOSIS — Z87891 Personal history of nicotine dependence: Secondary | ICD-10-CM | POA: Diagnosis not present

## 2017-09-08 DIAGNOSIS — E611 Iron deficiency: Secondary | ICD-10-CM | POA: Diagnosis not present

## 2017-09-08 DIAGNOSIS — Z79899 Other long term (current) drug therapy: Secondary | ICD-10-CM | POA: Diagnosis not present

## 2017-09-10 DIAGNOSIS — D485 Neoplasm of uncertain behavior of skin: Secondary | ICD-10-CM | POA: Diagnosis not present

## 2017-09-10 DIAGNOSIS — C44329 Squamous cell carcinoma of skin of other parts of face: Secondary | ICD-10-CM | POA: Diagnosis not present

## 2017-09-10 DIAGNOSIS — Z85828 Personal history of other malignant neoplasm of skin: Secondary | ICD-10-CM | POA: Diagnosis not present

## 2017-09-10 DIAGNOSIS — L820 Inflamed seborrheic keratosis: Secondary | ICD-10-CM | POA: Diagnosis not present

## 2017-09-10 DIAGNOSIS — L570 Actinic keratosis: Secondary | ICD-10-CM | POA: Diagnosis not present

## 2017-10-01 DIAGNOSIS — Z85828 Personal history of other malignant neoplasm of skin: Secondary | ICD-10-CM | POA: Diagnosis not present

## 2017-10-01 DIAGNOSIS — C44329 Squamous cell carcinoma of skin of other parts of face: Secondary | ICD-10-CM | POA: Diagnosis not present

## 2017-10-08 DIAGNOSIS — L570 Actinic keratosis: Secondary | ICD-10-CM | POA: Diagnosis not present

## 2017-11-14 DIAGNOSIS — M25561 Pain in right knee: Secondary | ICD-10-CM | POA: Diagnosis not present

## 2017-11-21 DIAGNOSIS — N400 Enlarged prostate without lower urinary tract symptoms: Secondary | ICD-10-CM | POA: Diagnosis not present

## 2017-11-21 DIAGNOSIS — F419 Anxiety disorder, unspecified: Secondary | ICD-10-CM | POA: Diagnosis not present

## 2017-11-21 DIAGNOSIS — Z6828 Body mass index (BMI) 28.0-28.9, adult: Secondary | ICD-10-CM | POA: Diagnosis not present

## 2017-11-21 DIAGNOSIS — E063 Autoimmune thyroiditis: Secondary | ICD-10-CM | POA: Diagnosis not present

## 2017-11-21 DIAGNOSIS — Z0001 Encounter for general adult medical examination with abnormal findings: Secondary | ICD-10-CM | POA: Diagnosis not present

## 2017-11-21 DIAGNOSIS — M1991 Primary osteoarthritis, unspecified site: Secondary | ICD-10-CM | POA: Diagnosis not present

## 2017-11-21 DIAGNOSIS — E663 Overweight: Secondary | ICD-10-CM | POA: Diagnosis not present

## 2017-11-21 DIAGNOSIS — Z1389 Encounter for screening for other disorder: Secondary | ICD-10-CM | POA: Diagnosis not present

## 2017-11-21 DIAGNOSIS — Z125 Encounter for screening for malignant neoplasm of prostate: Secondary | ICD-10-CM | POA: Diagnosis not present

## 2017-11-21 DIAGNOSIS — R946 Abnormal results of thyroid function studies: Secondary | ICD-10-CM | POA: Diagnosis not present

## 2017-11-26 DIAGNOSIS — L821 Other seborrheic keratosis: Secondary | ICD-10-CM | POA: Diagnosis not present

## 2017-11-26 DIAGNOSIS — L820 Inflamed seborrheic keratosis: Secondary | ICD-10-CM | POA: Diagnosis not present

## 2017-11-26 DIAGNOSIS — Z85828 Personal history of other malignant neoplasm of skin: Secondary | ICD-10-CM | POA: Diagnosis not present

## 2017-11-26 DIAGNOSIS — D692 Other nonthrombocytopenic purpura: Secondary | ICD-10-CM | POA: Diagnosis not present

## 2017-11-26 DIAGNOSIS — D1721 Benign lipomatous neoplasm of skin and subcutaneous tissue of right arm: Secondary | ICD-10-CM | POA: Diagnosis not present

## 2017-11-26 DIAGNOSIS — Z85820 Personal history of malignant melanoma of skin: Secondary | ICD-10-CM | POA: Diagnosis not present

## 2017-12-12 DIAGNOSIS — M1711 Unilateral primary osteoarthritis, right knee: Secondary | ICD-10-CM | POA: Diagnosis not present

## 2017-12-20 DIAGNOSIS — M25561 Pain in right knee: Secondary | ICD-10-CM | POA: Diagnosis not present

## 2017-12-26 DIAGNOSIS — S83281A Other tear of lateral meniscus, current injury, right knee, initial encounter: Secondary | ICD-10-CM | POA: Diagnosis not present

## 2018-01-13 DIAGNOSIS — S83271A Complex tear of lateral meniscus, current injury, right knee, initial encounter: Secondary | ICD-10-CM | POA: Diagnosis not present

## 2018-01-13 DIAGNOSIS — M94261 Chondromalacia, right knee: Secondary | ICD-10-CM | POA: Diagnosis not present

## 2018-01-13 DIAGNOSIS — Y939 Activity, unspecified: Secondary | ICD-10-CM | POA: Diagnosis not present

## 2018-01-13 DIAGNOSIS — Y929 Unspecified place or not applicable: Secondary | ICD-10-CM | POA: Diagnosis not present

## 2018-01-13 DIAGNOSIS — X58XXXA Exposure to other specified factors, initial encounter: Secondary | ICD-10-CM | POA: Diagnosis not present

## 2018-01-13 DIAGNOSIS — Y999 Unspecified external cause status: Secondary | ICD-10-CM | POA: Diagnosis not present

## 2018-01-13 DIAGNOSIS — G8918 Other acute postprocedural pain: Secondary | ICD-10-CM | POA: Diagnosis not present

## 2018-01-21 DIAGNOSIS — M25561 Pain in right knee: Secondary | ICD-10-CM | POA: Diagnosis not present

## 2018-01-28 DIAGNOSIS — M25561 Pain in right knee: Secondary | ICD-10-CM | POA: Diagnosis not present

## 2018-02-04 DIAGNOSIS — E748 Other specified disorders of carbohydrate metabolism: Secondary | ICD-10-CM | POA: Diagnosis not present

## 2018-02-04 DIAGNOSIS — E663 Overweight: Secondary | ICD-10-CM | POA: Diagnosis not present

## 2018-02-04 DIAGNOSIS — Z6827 Body mass index (BMI) 27.0-27.9, adult: Secondary | ICD-10-CM | POA: Diagnosis not present

## 2018-02-04 DIAGNOSIS — R7309 Other abnormal glucose: Secondary | ICD-10-CM | POA: Diagnosis not present

## 2018-02-04 DIAGNOSIS — M545 Low back pain: Secondary | ICD-10-CM | POA: Diagnosis not present

## 2018-02-04 DIAGNOSIS — R102 Pelvic and perineal pain: Secondary | ICD-10-CM | POA: Diagnosis not present

## 2018-02-11 DIAGNOSIS — M25561 Pain in right knee: Secondary | ICD-10-CM | POA: Diagnosis not present

## 2018-02-18 DIAGNOSIS — Z4789 Encounter for other orthopedic aftercare: Secondary | ICD-10-CM | POA: Diagnosis not present

## 2018-02-18 DIAGNOSIS — M1711 Unilateral primary osteoarthritis, right knee: Secondary | ICD-10-CM | POA: Diagnosis not present

## 2018-02-18 DIAGNOSIS — M25561 Pain in right knee: Secondary | ICD-10-CM | POA: Diagnosis not present

## 2018-03-03 DIAGNOSIS — M25561 Pain in right knee: Secondary | ICD-10-CM | POA: Diagnosis not present

## 2018-03-05 DIAGNOSIS — M25561 Pain in right knee: Secondary | ICD-10-CM | POA: Diagnosis not present

## 2018-03-05 DIAGNOSIS — S0502XD Injury of conjunctiva and corneal abrasion without foreign body, left eye, subsequent encounter: Secondary | ICD-10-CM | POA: Diagnosis not present

## 2018-03-10 DIAGNOSIS — M25561 Pain in right knee: Secondary | ICD-10-CM | POA: Diagnosis not present

## 2018-03-12 DIAGNOSIS — M25561 Pain in right knee: Secondary | ICD-10-CM | POA: Diagnosis not present

## 2018-04-01 DIAGNOSIS — Z4789 Encounter for other orthopedic aftercare: Secondary | ICD-10-CM | POA: Diagnosis not present

## 2018-04-01 DIAGNOSIS — M1711 Unilateral primary osteoarthritis, right knee: Secondary | ICD-10-CM | POA: Diagnosis not present

## 2018-04-03 DIAGNOSIS — Z23 Encounter for immunization: Secondary | ICD-10-CM | POA: Diagnosis not present

## 2018-04-03 DIAGNOSIS — G6289 Other specified polyneuropathies: Secondary | ICD-10-CM | POA: Diagnosis not present

## 2018-04-03 DIAGNOSIS — Z79899 Other long term (current) drug therapy: Secondary | ICD-10-CM | POA: Diagnosis not present

## 2018-04-03 DIAGNOSIS — G629 Polyneuropathy, unspecified: Secondary | ICD-10-CM | POA: Diagnosis not present

## 2018-04-15 DIAGNOSIS — M1711 Unilateral primary osteoarthritis, right knee: Secondary | ICD-10-CM | POA: Diagnosis not present

## 2018-04-22 DIAGNOSIS — M1711 Unilateral primary osteoarthritis, right knee: Secondary | ICD-10-CM | POA: Diagnosis not present

## 2018-04-29 DIAGNOSIS — M1711 Unilateral primary osteoarthritis, right knee: Secondary | ICD-10-CM | POA: Diagnosis not present

## 2018-05-20 DIAGNOSIS — M1711 Unilateral primary osteoarthritis, right knee: Secondary | ICD-10-CM | POA: Diagnosis not present

## 2018-05-20 DIAGNOSIS — M5431 Sciatica, right side: Secondary | ICD-10-CM | POA: Diagnosis not present

## 2018-05-21 DIAGNOSIS — L814 Other melanin hyperpigmentation: Secondary | ICD-10-CM | POA: Diagnosis not present

## 2018-05-21 DIAGNOSIS — Z85828 Personal history of other malignant neoplasm of skin: Secondary | ICD-10-CM | POA: Diagnosis not present

## 2018-05-21 DIAGNOSIS — D1801 Hemangioma of skin and subcutaneous tissue: Secondary | ICD-10-CM | POA: Diagnosis not present

## 2018-05-21 DIAGNOSIS — L570 Actinic keratosis: Secondary | ICD-10-CM | POA: Diagnosis not present

## 2018-05-21 DIAGNOSIS — L821 Other seborrheic keratosis: Secondary | ICD-10-CM | POA: Diagnosis not present

## 2018-05-21 DIAGNOSIS — D485 Neoplasm of uncertain behavior of skin: Secondary | ICD-10-CM | POA: Diagnosis not present

## 2018-05-21 DIAGNOSIS — L859 Epidermal thickening, unspecified: Secondary | ICD-10-CM | POA: Diagnosis not present

## 2018-06-15 DIAGNOSIS — Z01812 Encounter for preprocedural laboratory examination: Secondary | ICD-10-CM | POA: Diagnosis not present

## 2018-06-15 DIAGNOSIS — M1711 Unilateral primary osteoarthritis, right knee: Secondary | ICD-10-CM | POA: Diagnosis not present

## 2018-06-15 DIAGNOSIS — Z01818 Encounter for other preprocedural examination: Secondary | ICD-10-CM | POA: Diagnosis not present

## 2018-06-15 DIAGNOSIS — Z1389 Encounter for screening for other disorder: Secondary | ICD-10-CM | POA: Diagnosis not present

## 2018-06-15 DIAGNOSIS — E663 Overweight: Secondary | ICD-10-CM | POA: Diagnosis not present

## 2018-06-15 DIAGNOSIS — M25561 Pain in right knee: Secondary | ICD-10-CM | POA: Diagnosis not present

## 2018-06-15 DIAGNOSIS — R7309 Other abnormal glucose: Secondary | ICD-10-CM | POA: Diagnosis not present

## 2018-06-15 DIAGNOSIS — E1129 Type 2 diabetes mellitus with other diabetic kidney complication: Secondary | ICD-10-CM | POA: Diagnosis not present

## 2018-06-15 DIAGNOSIS — Z6828 Body mass index (BMI) 28.0-28.9, adult: Secondary | ICD-10-CM | POA: Diagnosis not present

## 2018-06-22 ENCOUNTER — Ambulatory Visit: Payer: Self-pay | Admitting: Orthopedic Surgery

## 2018-06-22 NOTE — H&P (Signed)
TOTAL KNEE ADMISSION H&P  Patient is being admitted for right total knee arthroplasty.  Subjective:  Chief Complaint:right knee pain.  HPI: Frank Weber, 68 y.o. male, has a history of pain and functional disability in the right knee due to arthritis and has failed non-surgical conservative treatments for greater than 12 weeks to includecorticosteriod injections, viscosupplementation injections, flexibility and strengthening excercises, use of assistive devices and activity modification.  Onset of symptoms was gradual, starting 7 years ago with gradually worsening course since that time. The patient noted prior procedures on the knee to include  arthroscopy on the right knee(s).  Patient currently rates pain in the right knee(s) at 9 out of 10 with activity. Patient has worsening of pain with activity and weight bearing, pain that interferes with activities of daily living and pain with passive range of motion.  Patient has evidence of subchondral sclerosis, periarticular osteophytes and joint space narrowing by imaging studies.  There is no active infection.  Patient Active Problem List   Diagnosis Date Noted  . Right ureteral stone 06/18/2015  . Unspecified hereditary and idiopathic peripheral neuropathy 01/27/2013  . IBS (irritable bowel syndrome) 01/27/2013  . GERD (gastroesophageal reflux disease) 01/27/2013  . History of migraine headaches 01/27/2013   Past Medical History:  Diagnosis Date  . Arthritis    right knee  . Constipation   . Frequency of urination   . GERD (gastroesophageal reflux disease)   . History of kidney stones   . History of melanoma excision    2012--  NECK/ HEAD  . History of squamous cell carcinoma excision    RIGHT EAR  . Hyperlipidemia   . Hypothyroidism   . IBS (irritable bowel syndrome)    states slight  . Lumbar stenosis    L5 - S1  . Migraines   . Peripheral neuropathy    legs and feet  . Right flank pain   . Right ureteral stone   .  Urgency of urination   . Wears glasses     Past Surgical History:  Procedure Laterality Date  . CARDIAC CATHETERIZATION  07-08-2007  dr Tami Ribas    no significant CAD, preserved LVF, ef 50%//  30% pLAD  . CARDIOVASCULAR STRESS TEST  06-20-2011  dr croitoru   Low risk study/  normal perfusion scan showing attenuation artifact in the anterior region of myocardium with no ischemia , infarct or scar/  normal LV funciton and wall motion , ef 62%  . COLONOSCOPY N/A 02/17/2013   Procedure: COLONOSCOPY;  Surgeon: Rogene Houston, MD;  [facility];  Service: Endoscopy;  Laterality: N/A;  225  . CYSTOSCOPY W/ URETERAL STENT PLACEMENT Right 06/18/2015   Procedure: CYSTOSCOPY WITH RIGHT RETROGRADE PYELOGRAM RIGHT URETERAL STENT PLACEMENT;  Surgeon: [name], MD;  Location: [facility];  Service: Urology;  Laterality: Right;  . CYSTOSCOPY/URETEROSCOPY/HOLMIUM LASER/STENT PLACEMENT Right 06/27/2015   Procedure: CYSTOSCOPY RIGHT URETEROSCOPY  STENT REMOVAL; STONE EXTRACTION WITH BASKET;  Surgeon: [name], MD;  [facility]: [facility];  Service: Urology;  Laterality: Right;  . HEAD & NECK SKIN LESION EXCISIONAL BIOPSY    . HOLMIUM LASER APPLICATION Right 08/23/4006   Procedure: HOLMIUM LASER LITHOTRIPSY ;  Surgeon: [name], MD;  Location: [facility];  Service: Urology;  Laterality: Right;  . INGUINAL HERNIA REPAIR Left 10/21/2007  . KNEE ARTHROSCOPY W/ ACL RECONSTRUCTION Right 1992  . SHOULDER ARTHROSCOPY WITH OPEN ROTATOR CUFF REPAIR Left 07/27/2014   Procedure: LEFT SHOULDER ARTHROSCOPY WITH  MINI OPEN ROTATOR CUFF REPAIR;  Surgeon: [name], MD;  Location: [place];  Service: Orthopedics;  Laterality: Left;  . SHOULDER ARTHROSCOPY WITH OPEN ROTATOR CUFF REPAIR Right 2006  . TONSILLECTOMY AND ADENOIDECTOMY  age 26  . TRANSTHORACIC ECHOCARDIOGRAM  07-30-2007   normal LVF, ef >55%/  mild AR, MR , PR and TR/  mild AV sclerosis without stenosis/   .  URETEROSCOPY Right 06/18/2015   Procedure: URETEROSCOPY;  Surgeon: [name], MD;  Location: [facility];  Service: Urology;  Laterality: Right;    No current facility-administered medications for this encounter.    Current Outpatient Medications  Medication Sig Dispense Refill Last Dose  . amoxicillin-clavulanate (AUGMENTIN) 875-125 MG tablet Take 1 tablet by mouth every 12 (twelve) hours. For 7 days 14 tablet 0   . cephALEXin (KEFLEX) 500 MG capsule Take 1 capsule (500 mg total) by mouth 4 (four) times daily. 12 capsule 0 Past Week at Unknown time  . DULoxetine (CYMBALTA) 30 MG capsule Take 30 mg by mouth at bedtime.     06/26/2015 at Unknown time  . gabapentin (NEURONTIN) 800 MG tablet Take 800 mg by mouth 3 (three) times daily.     06/27/2015 at 0630  . levothyroxine (SYNTHROID, LEVOTHROID) 25 MCG tablet Take 25 mcg by mouth daily before breakfast.   06/27/2015 at 0630  . LORazepam (ATIVAN) 0.5 MG tablet Take 0.5 mg by mouth at bedtime.     06/26/2015 at Unknown time  . oxyCODONE-acetaminophen (ROXICET) 5-325 MG tablet Take 1 tablet by mouth every 4 (four) hours as needed for moderate pain. 20 tablet 0 Past Week at Unknown time  . phenazopyridine (PYRIDIUM) 200 MG tablet Take 1 tablet (200 mg total) by mouth 3 (three) times daily as needed for pain. 15 tablet 1 Past Week at Unknown time  . pravastatin (PRAVACHOL) 40 MG tablet Take 40 mg by mouth every evening.   06/26/2015 at Unknown time  . Probiotic Product (ALIGN) 4 MG CAPS Take 4 mg by mouth daily.     06/26/2015 at Unknown time  . promethazine (PHENERGAN) 25 MG tablet Take 1 tablet (25 mg total) by mouth every 6 (six) hours as needed for nausea or vomiting. 15 tablet 0 06/26/2015 at Unknown time  . rizatriptan (MAXALT-MLT) 10 MG disintegrating tablet Take 10 mg by mouth as needed. May repeat in 2 hours if needed MIGRAINE    Past Week at Unknown time  . Sodium Bicarbonate-Citric Acid (ALKA-SELTZER HEARTBURN PO) Take by mouth as needed.   Past Week at  Unknown time  . topiramate (TOPAMAX) 25 MG capsule Take 75 mg by mouth at bedtime.    06/26/2015 at Unknown time   Allergies  Allergen Reactions  . Sulfa Antibiotics Shortness Of Breath and Rash  . Coconut Oil Rash    Social History   Tobacco Use  . Smoking status: Current Some Day Smoker    Packs/day: 0.00    Years: 30.00    Pack years: 0.00    Types: Cigars  . Smokeless tobacco: Never Used  . Tobacco comment: smokes an occasional cigar  Substance Use Topics  . Alcohol use: Yes    Comment: occasionally    Family History  Problem Relation Age of Onset  . Colon cancer Other        Age of Onset-late 21's     Review of Systems  Constitutional: Negative.   HENT: Negative.   Eyes: Negative.   Respiratory: Negative.   Cardiovascular:  Negative.   Gastrointestinal: Negative.   Genitourinary: Negative.   Musculoskeletal: Positive for joint pain.  Skin: Negative.   Neurological: Negative.   Endo/Heme/Allergies: Negative.   Psychiatric/Behavioral: Negative.     Objective:  Physical Exam  Constitutional: He is oriented to person, place, and time. He appears well-developed.  HENT:  Head: Normocephalic.  Eyes: EOM are normal.  Neck: Normal range of motion.  Cardiovascular: Normal rate and intact distal pulses.  Respiratory: Effort normal and breath sounds normal.  GI: Soft.  Genitourinary:    Genitourinary Comments: Deferred   Musculoskeletal:     Comments: Right knee edema. Stable at varus and valgus stress. /Limited and ROM and strength.  Neurological: He is alert and oriented to person, place, and time.  Skin: Skin is warm and dry.  Psychiatric: His behavior is normal.    Vital signs in last 24 hours: BP: ()/()  Arterial Line BP: ()/()   Labs:   Estimated body mass index is 26.25 kg/m as calculated from the following:   Height as of 03/29/17: 6\' 3"  (1.905 m).   Weight as of 03/29/17: 95.3 kg.   Imaging Review Plain radiographs demonstrate severe  degenerative joint disease of the right knee(s). The overall alignment ismild varus. The bone quality appears to be good for age and reported activity level.   Preoperative templating of the joint replacement has been completed, documented, and submitted to the Operating Room personnel in order to optimize intra-operative equipment management.   Anticipated LOS equal to or greater than 2 midnights due to - Age 30 and older with one or more of the following:  - Obesity  - Expected need for hospital services (PT, OT, Nursing) required for safe  discharge  - Anticipated need for postoperative skilled nursing care or inpatient rehab  - Active co-morbidities: None OR   - Unanticipated findings during/Post Surgery: Slow post-op progression: GI, pain control, mobility  - Patient is a high risk of re-admission due to: None     Assessment/Plan:  End stage arthritis, right knee   The patient history, physical examination, clinical judgment of the provider and imaging studies are consistent with end stage degenerative joint disease of the right knee(s) and total knee arthroplasty is deemed medically necessary. The treatment options including medical management, injection therapy arthroscopy and arthroplasty were discussed at length. The risks and benefits of total knee arthroplasty were presented and reviewed. The risks due to aseptic loosening, infection, stiffness, patella tracking problems, thromboembolic complications and other imponderables were discussed. The patient acknowledged the explanation, agreed to proceed with the plan and consent was signed. Patient is being admitted for inpatient treatment for surgery, pain control, PT, OT, prophylactic antibiotics, VTE prophylaxis, progressive ambulation and ADL's and discharge planning. The patient is planning to be discharged home with home health services.   Will use IV tranexamic acid. Contraindications and adverse affects of Tranexamic acid  discussed in detail. Patient denies any of these at this time and understands the risks and benefits.

## 2018-07-01 NOTE — Progress Notes (Signed)
CLEARANCE DR. MICHAEL DEPASQUE 06-16-2018 ON CHART   HGBA1C 06-15-2018 ON CHART FROM [facility]  LOV NEURO 02-20-17 Epic

## 2018-07-01 NOTE — Patient Instructions (Signed)
Frank Weber  07/01/2018   Your procedure is scheduled on: 07-10-2018    Report to [facility] Main  Entrance     Report to admitting at 7:30AM    Call this number if you have problems the morning of surgery [phone]      Remember: Do not eat food or drink liquids :After Midnight. BRUSH YOUR TEETH MORNING OF SURGERY AND RINSE YOUR MOUTH OUT, NO CHEWING GUM CANDY OR MINTS.     Take these medicines the morning of surgery with A SIP OF WATER: GABAPENTIN, LEVOTHYROXINE                                You may not have any metal on your body including hair pins and              piercings  Do not wear jewelry, make-up, lotions, powders or perfumes, deodorant                         Men may shave face and neck.   Do not bring valuables to the hospital. [place].  Contacts, dentures or bridgework may not be worn into surgery.  Leave suitcase in the car. After surgery it may be brought to your room.                Please read over the following fact sheets you were given: _____________________________________________________________________             [facility] - Preparing for Surgery Before surgery, you can play an important role.  Because skin is not sterile, your skin needs to be as free of germs as possible.  You can reduce the number of germs on your skin by washing with CHG (chlorahexidine gluconate) soap before surgery.  CHG is an antiseptic cleaner which kills germs and bonds with the skin to continue killing germs even after washing. Please DO NOT use if you have an allergy to CHG or antibacterial soaps.  If your skin becomes reddened/irritated stop using the CHG and inform your nurse when you arrive at Short Stay. Do not shave (including legs and underarms) for at least 48 hours prior to the first CHG shower.  You may shave your face/neck. Please follow these instructions carefully:  1.  Shower  with CHG Soap the night before surgery and the  morning of Surgery.  2.  If you choose to wash your hair, wash your hair first as usual with your  normal  shampoo.  3.  After you shampoo, rinse your hair and body thoroughly to remove the  shampoo.                           4.  Use CHG as you would any other liquid soap.  You can apply chg directly  to the skin and wash                       Gently with a scrungie or clean washcloth.  5.  Apply the CHG Soap to your body ONLY FROM THE NECK DOWN.   Do not use on face/ open  Wound or open sores. Avoid contact with eyes, ears mouth and genitals (private parts).                       Wash face,  Genitals (private parts) with your normal soap.             6.  Wash thoroughly, paying special attention to the area where your surgery  will be performed.  7.  Thoroughly rinse your body with warm water from the neck down.  8.  DO NOT shower/wash with your normal soap after using and rinsing off  the CHG Soap.                9.  Pat yourself dry with a clean towel.            10.  Wear clean pajamas.            11.  Place clean sheets on your bed the night of your first shower and do not  sleep with pets. Day of Surgery : Do not apply any lotions/deodorants the morning of surgery.  Please wear clean clothes to the hospital/surgery center.  FAILURE TO FOLLOW THESE INSTRUCTIONS MAY RESULT IN THE CANCELLATION OF YOUR SURGERY PATIENT SIGNATURE_________________________________  NURSE SIGNATURE__________________________________  ________________________________________________________________________   Frank Weber  An incentive spirometer is a tool that can help keep your lungs clear and active. This tool measures how well you are filling your lungs with each breath. Taking long deep breaths may help reverse or decrease the chance of developing breathing (pulmonary) problems (especially infection) following:  A long period  of time when you are unable to move or be active. BEFORE THE PROCEDURE   If the spirometer includes an indicator to show your best effort, your nurse or respiratory therapist will set it to a desired goal.  If possible, sit up straight or lean slightly forward. Try not to slouch.  Hold the incentive spirometer in an upright position. INSTRUCTIONS FOR USE  1. Sit on the edge of your bed if possible, or sit up as far as you can in bed or on a chair. 2. Hold the incentive spirometer in an upright position. 3. Breathe out normally. 4. Place the mouthpiece in your mouth and seal your lips tightly around it. 5. Breathe in slowly and as deeply as possible, raising the piston or the ball toward the top of the column. 6. Hold your breath for 3-5 seconds or for as long as possible. Allow the piston or ball to fall to the bottom of the column. 7. Remove the mouthpiece from your mouth and breathe out normally. 8. Rest for a few seconds and repeat Steps 1 through 7 at least 10 times every 1-2 hours when you are awake. Take your time and take a few normal breaths between deep breaths. 9. The spirometer may include an indicator to show your best effort. Use the indicator as a goal to work toward during each repetition. 10. After each set of 10 deep breaths, practice coughing to be sure your lungs are clear. If you have an incision (the cut made at the time of surgery), support your incision when coughing by placing a pillow or rolled up towels firmly against it. Once you are able to get out of bed, walk around indoors and cough well. You may stop using the incentive spirometer when instructed by your caregiver.  RISKS AND COMPLICATIONS  Take your time so you do not get  dizzy or light-headed.  If you are in pain, you may need to take or ask for pain medication before doing incentive spirometry. It is harder to take a deep breath if you are having pain. AFTER USE  Rest and breathe slowly and easily.  It  can be helpful to keep track of a log of your progress. Your caregiver can provide you with a simple table to help with this. If you are using the spirometer at home, follow these instructions: [place] IF:   You are having difficultly using the spirometer.  You have trouble using the spirometer as often as instructed.  Your pain medication is not giving enough relief while using the spirometer.  You develop fever of 100.5 F (38.1 C) or higher. SEEK IMMEDIATE MEDICAL CARE IF:   You cough up bloody sputum that had not been present before.  You develop fever of 102 F (38.9 C) or greater.  You develop worsening pain at or near the incision site. MAKE SURE YOU:   Understand these instructions.  Will watch your condition.  Will get help right away if you are not doing well or get worse. Document Released: 10/07/2006 Document Revised: 08/19/2011 Document Reviewed: 12/08/2006 [facility] Patient Information 2014 [place], [place].   ________________________________________________________________________

## 2018-07-02 ENCOUNTER — Other Ambulatory Visit: Payer: Self-pay

## 2018-07-02 ENCOUNTER — Encounter (HOSPITAL_COMMUNITY): Payer: Self-pay

## 2018-07-02 ENCOUNTER — Encounter (HOSPITAL_COMMUNITY)
Admission: RE | Admit: 2018-07-02 | Discharge: 2018-07-02 | Disposition: A | Discharge status: Home / Self Care | Payer: Medicare Other | Source: Ambulatory Visit | Attending: Specialist | Admitting: Specialist

## 2018-07-02 DIAGNOSIS — M1711 Unilateral primary osteoarthritis, right knee: Secondary | ICD-10-CM | POA: Diagnosis not present

## 2018-07-02 DIAGNOSIS — Z01812 Encounter for preprocedural laboratory examination: Secondary | ICD-10-CM | POA: Insufficient documentation

## 2018-07-02 HISTORY — DX: Cardiac murmur, unspecified: R01.1

## 2018-07-02 LAB — URINALYSIS, ROUTINE W REFLEX MICROSCOPIC
Bilirubin Urine: NEGATIVE
Glucose, UA: NEGATIVE mg/dL
Hgb urine dipstick: NEGATIVE
KETONES UR: 5 mg/dL — AB
Leukocytes, UA: NEGATIVE
Nitrite: NEGATIVE
PH: 5 (ref 5.0–8.0)
Protein, ur: NEGATIVE mg/dL
Specific Gravity, Urine: 1.026 (ref 1.005–1.030)

## 2018-07-02 LAB — BASIC METABOLIC PANEL
Anion gap: 9 (ref 5–15)
BUN: 15 mg/dL (ref 8–23)
CO2: 27 mmol/L (ref 22–32)
Calcium: 9.5 mg/dL (ref 8.9–10.3)
Chloride: 103 mmol/L (ref 98–111)
Creatinine, Ser: 1.14 mg/dL (ref 0.61–1.24)
GFR calc non Af Amer: >60 mL/min (ref >60)
Glucose, Bld: 91 mg/dL (ref 70–99)
Potassium: 4.2 mmol/L (ref 3.5–5.1)
Sodium: 139 mmol/L (ref 135–145)

## 2018-07-02 LAB — CBC
HCT: 52.9 % — ABNORMAL HIGH (ref 39.0–52.0)
Hemoglobin: 17.1 g/dL — ABNORMAL HIGH (ref 13.0–17.0)
MCH: 32.4 pg (ref 26.0–34.0)
MCHC: 32.3 g/dL (ref 30.0–36.0)
MCV: 100.2 fL — ABNORMAL HIGH (ref 80.0–100.0)
NRBC: 0 % (ref 0.0–0.2)
Platelets: 227 10*3/uL (ref 150–400)
RBC: 5.28 MIL/uL (ref 4.22–5.81)
RDW: 12.6 % (ref 11.5–15.5)
WBC: 7.3 10*3/uL (ref 4.0–10.5)

## 2018-07-02 LAB — SURGICAL PCR SCREEN
MRSA, PCR: NEGATIVE
Staphylococcus aureus: NEGATIVE

## 2018-07-02 LAB — PROTIME-INR
INR: 0.91
Prothrombin Time: 12.2 seconds (ref 11.4–15.2)

## 2018-07-02 LAB — APTT: aPTT: 33 seconds (ref 24–36)

## 2018-07-03 LAB — ABO/RH: ABO/RH(D): O NEG

## 2018-07-03 NOTE — Pre-Procedure Instructions (Signed)
CBC and UA results 07/02/2018 sent to Dr. Theda Sers via epic.

## 2018-07-10 ENCOUNTER — Encounter (HOSPITAL_COMMUNITY): Payer: Self-pay | Admitting: Anesthesiology

## 2018-07-10 ENCOUNTER — Encounter (HOSPITAL_COMMUNITY)
Admission: RE | Disposition: A | Discharge status: Home / Self Care | Payer: Self-pay | Source: Home / Self Care | Attending: Specialist

## 2018-07-10 ENCOUNTER — Other Ambulatory Visit: Payer: Self-pay

## 2018-07-10 ENCOUNTER — Inpatient Hospital Stay (HOSPITAL_COMMUNITY)
Admission: RE | Admit: 2018-07-10 | Discharge: 2018-07-14 | DRG: 464 | Disposition: A | Discharge status: Home Health | Payer: Medicare Other | Attending: Specialist | Admitting: Specialist

## 2018-07-10 ENCOUNTER — Inpatient Hospital Stay (HOSPITAL_COMMUNITY): Payer: Medicare Other | Admitting: Anesthesiology

## 2018-07-10 ENCOUNTER — Inpatient Hospital Stay (HOSPITAL_COMMUNITY): Payer: Medicare Other | Admitting: Physician Assistant

## 2018-07-10 DIAGNOSIS — Z79899 Other long term (current) drug therapy: Secondary | ICD-10-CM

## 2018-07-10 DIAGNOSIS — Z79890 Hormone replacement therapy (postmenopausal): Secondary | ICD-10-CM

## 2018-07-10 DIAGNOSIS — K219 Gastro-esophageal reflux disease without esophagitis: Secondary | ICD-10-CM | POA: Diagnosis present

## 2018-07-10 DIAGNOSIS — Z96659 Presence of unspecified artificial knee joint: Secondary | ICD-10-CM

## 2018-07-10 DIAGNOSIS — G629 Polyneuropathy, unspecified: Secondary | ICD-10-CM | POA: Diagnosis not present

## 2018-07-10 DIAGNOSIS — M1711 Unilateral primary osteoarthritis, right knee: Principal | ICD-10-CM | POA: Diagnosis present

## 2018-07-10 DIAGNOSIS — L7632 Postprocedural hematoma of skin and subcutaneous tissue following other procedure: Secondary | ICD-10-CM | POA: Diagnosis not present

## 2018-07-10 DIAGNOSIS — E785 Hyperlipidemia, unspecified: Secondary | ICD-10-CM | POA: Diagnosis present

## 2018-07-10 DIAGNOSIS — Y838 Other surgical procedures as the cause of abnormal reaction of the patient, or of later complication, without mention of misadventure at the time of the procedure: Secondary | ICD-10-CM | POA: Diagnosis not present

## 2018-07-10 DIAGNOSIS — M25761 Osteophyte, right knee: Secondary | ICD-10-CM | POA: Diagnosis not present

## 2018-07-10 DIAGNOSIS — Z85820 Personal history of malignant melanoma of skin: Secondary | ICD-10-CM | POA: Diagnosis not present

## 2018-07-10 DIAGNOSIS — Z96651 Presence of right artificial knee joint: Secondary | ICD-10-CM

## 2018-07-10 DIAGNOSIS — G609 Hereditary and idiopathic neuropathy, unspecified: Secondary | ICD-10-CM | POA: Diagnosis present

## 2018-07-10 DIAGNOSIS — G43909 Migraine, unspecified, not intractable, without status migrainosus: Secondary | ICD-10-CM | POA: Diagnosis not present

## 2018-07-10 DIAGNOSIS — G8918 Other acute postprocedural pain: Secondary | ICD-10-CM | POA: Diagnosis not present

## 2018-07-10 DIAGNOSIS — T8130XA Disruption of wound, unspecified, initial encounter: Secondary | ICD-10-CM | POA: Diagnosis not present

## 2018-07-10 DIAGNOSIS — R52 Pain, unspecified: Secondary | ICD-10-CM | POA: Diagnosis not present

## 2018-07-10 DIAGNOSIS — Z882 Allergy status to sulfonamides status: Secondary | ICD-10-CM | POA: Diagnosis not present

## 2018-07-10 DIAGNOSIS — E039 Hypothyroidism, unspecified: Secondary | ICD-10-CM | POA: Diagnosis present

## 2018-07-10 DIAGNOSIS — T8131XA Disruption of external operation (surgical) wound, not elsewhere classified, initial encounter: Secondary | ICD-10-CM | POA: Diagnosis not present

## 2018-07-10 DIAGNOSIS — M79651 Pain in right thigh: Secondary | ICD-10-CM | POA: Diagnosis not present

## 2018-07-10 DIAGNOSIS — R58 Hemorrhage, not elsewhere classified: Secondary | ICD-10-CM | POA: Diagnosis not present

## 2018-07-10 DIAGNOSIS — Z7401 Bed confinement status: Secondary | ICD-10-CM | POA: Diagnosis not present

## 2018-07-10 DIAGNOSIS — F17290 Nicotine dependence, other tobacco product, uncomplicated: Secondary | ICD-10-CM | POA: Diagnosis present

## 2018-07-10 DIAGNOSIS — T8141XA Infection following a procedure, superficial incisional surgical site, initial encounter: Secondary | ICD-10-CM | POA: Diagnosis not present

## 2018-07-10 DIAGNOSIS — Z471 Aftercare following joint replacement surgery: Secondary | ICD-10-CM | POA: Diagnosis not present

## 2018-07-10 HISTORY — PX: TOTAL KNEE ARTHROPLASTY: SHX125

## 2018-07-10 LAB — TYPE AND SCREEN
ABO/RH(D): O NEG
ANTIBODY SCREEN: NEGATIVE

## 2018-07-10 SURGERY — ARTHROPLASTY, KNEE, TOTAL
Anesthesia: Spinal | Site: Knee | Laterality: Right

## 2018-07-10 MED ORDER — HYDROMORPHONE HCL 1 MG/ML IJ SOLN
0.2500 mg | INTRAMUSCULAR | Status: DC | PRN
  Administered 2018-07-10 (×2): 0.5 mg via INTRAVENOUS

## 2018-07-10 MED ORDER — CEFAZOLIN SODIUM-DEXTROSE 2-4 GM/100ML-% IV SOLN
2.0000 g | Freq: Four times a day (QID) | INTRAVENOUS | Status: AC
  Administered 2018-07-10 (×2): 2 g via INTRAVENOUS
  Filled 2018-07-10 (×2): qty 100

## 2018-07-10 MED ORDER — MAGNESIUM CITRATE PO SOLN: 1.0000 | Freq: Once | ORAL | Status: DC | PRN

## 2018-07-10 MED ORDER — METHOCARBAMOL 500 MG PO TABS
500.0000 mg | ORAL_TABLET | Freq: Four times a day (QID) | ORAL | Status: DC | PRN
  Administered 2018-07-10 – 2018-07-12 (×5): 500 mg via ORAL
  Filled 2018-07-10 (×6): qty 1

## 2018-07-10 MED ORDER — FERROUS SULFATE 325 (65 FE) MG PO TABS
325.0000 mg | ORAL_TABLET | Freq: Three times a day (TID) | ORAL | Status: DC
  Administered 2018-07-11 – 2018-07-14 (×10): 325 mg via ORAL
  Filled 2018-07-10 (×10): qty 1

## 2018-07-10 MED ORDER — DULOXETINE HCL 30 MG PO CPEP
30.0000 mg | ORAL_CAPSULE | Freq: Every day | ORAL | Status: DC
  Administered 2018-07-10 – 2018-07-13 (×4): 30 mg via ORAL
  Filled 2018-07-10 (×5): qty 1

## 2018-07-10 MED ORDER — DEXAMETHASONE SODIUM PHOSPHATE 10 MG/ML IJ SOLN
10.0000 mg | Freq: Once | INTRAMUSCULAR | Status: AC
  Administered 2018-07-10: 10 mg via INTRAVENOUS

## 2018-07-10 MED ORDER — LORAZEPAM 1 MG PO TABS
1.0000 mg | ORAL_TABLET | Freq: Every evening | ORAL | Status: DC | PRN
  Administered 2018-07-10: 1 mg via ORAL
  Administered 2018-07-11 – 2018-07-12 (×2): 1.5 mg via ORAL
  Administered 2018-07-13: 1 mg via ORAL
  Filled 2018-07-10 (×2): qty 1
  Filled 2018-07-10 (×2): qty 2

## 2018-07-10 MED ORDER — EPHEDRINE SULFATE-NACL 50-0.9 MG/10ML-% IV SOSY
PREFILLED_SYRINGE | INTRAVENOUS | Status: DC | PRN
  Administered 2018-07-10 (×2): 10 mg via INTRAVENOUS

## 2018-07-10 MED ORDER — BUPIVACAINE HCL (PF) 0.25 % IJ SOLN
INTRAMUSCULAR | Status: DC | PRN
  Administered 2018-07-10: 30 mL

## 2018-07-10 MED ORDER — ROPIVACAINE HCL 7.5 MG/ML IJ SOLN
INTRAMUSCULAR | Status: DC | PRN
  Administered 2018-07-10: 20 mL via PERINEURAL

## 2018-07-10 MED ORDER — CHLORHEXIDINE GLUCONATE 4 % EX LIQD: 60.0000 mL | Freq: Once | CUTANEOUS | Status: DC

## 2018-07-10 MED ORDER — SODIUM CHLORIDE 0.9 % IV SOLN
INTRAVENOUS | Status: DC
  Administered 2018-07-10: 1000 mL via INTRAVENOUS
  Administered 2018-07-11: 01:00:00 via INTRAVENOUS

## 2018-07-10 MED ORDER — DOCUSATE SODIUM 100 MG PO CAPS
100.0000 mg | ORAL_CAPSULE | Freq: Two times a day (BID) | ORAL | Status: DC
  Administered 2018-07-10 – 2018-07-14 (×8): 100 mg via ORAL
  Filled 2018-07-10 (×8): qty 1

## 2018-07-10 MED ORDER — FENTANYL CITRATE (PF) 100 MCG/2ML IJ SOLN
INTRAMUSCULAR | Status: AC
  Administered 2018-07-10: 100 ug via INTRAVENOUS
  Filled 2018-07-10: qty 2

## 2018-07-10 MED ORDER — PHENOL 1.4 % MT LIQD
1.0000 | OROMUCOSAL | Status: DC | PRN
  Filled 2018-07-10: qty 177

## 2018-07-10 MED ORDER — KETOROLAC TROMETHAMINE 30 MG/ML IJ SOLN
INTRAMUSCULAR | Status: DC | PRN
  Administered 2018-07-10: 30 mg via INTRAMUSCULAR

## 2018-07-10 MED ORDER — DEXAMETHASONE SODIUM PHOSPHATE 10 MG/ML IJ SOLN
10.0000 mg | Freq: Once | INTRAMUSCULAR | Status: AC
  Administered 2018-07-11: 10 mg via INTRAVENOUS
  Filled 2018-07-10: qty 1

## 2018-07-10 MED ORDER — GLYCOPYRROLATE PF 0.2 MG/ML IJ SOSY
PREFILLED_SYRINGE | INTRAMUSCULAR | Status: AC
  Filled 2018-07-10: qty 1

## 2018-07-10 MED ORDER — GABAPENTIN 300 MG PO CAPS
900.0000 mg | ORAL_CAPSULE | Freq: Four times a day (QID) | ORAL | Status: DC
  Administered 2018-07-10 – 2018-07-14 (×15): 900 mg via ORAL
  Filled 2018-07-10 (×15): qty 3

## 2018-07-10 MED ORDER — BISACODYL 5 MG PO TBEC
5.0000 mg | DELAYED_RELEASE_TABLET | Freq: Every day | ORAL | Status: DC | PRN
  Administered 2018-07-13 – 2018-07-14 (×2): 5 mg via ORAL
  Filled 2018-07-10 (×2): qty 1

## 2018-07-10 MED ORDER — MIDAZOLAM HCL 2 MG/2ML IJ SOLN: 0.5000 mg | Freq: Once | INTRAMUSCULAR | Status: DC | PRN

## 2018-07-10 MED ORDER — METHOCARBAMOL 500 MG IVPB - SIMPLE MED
INTRAVENOUS | Status: AC
  Administered 2018-07-10: 500 mg via INTRAVENOUS
  Filled 2018-07-10: qty 50

## 2018-07-10 MED ORDER — ONDANSETRON HCL 4 MG/2ML IJ SOLN
4.0000 mg | Freq: Four times a day (QID) | INTRAMUSCULAR | Status: DC | PRN
  Administered 2018-07-10 – 2018-07-11 (×2): 4 mg via INTRAVENOUS
  Filled 2018-07-10 (×2): qty 2

## 2018-07-10 MED ORDER — GELATIN ABSORBABLE 12-7 MM EX MISC
CUTANEOUS | Status: DC | PRN
  Administered 2018-07-10: 1

## 2018-07-10 MED ORDER — PRAVASTATIN SODIUM 40 MG PO TABS
40.0000 mg | ORAL_TABLET | Freq: Every day | ORAL | Status: DC
  Administered 2018-07-10 – 2018-07-13 (×4): 40 mg via ORAL
  Filled 2018-07-10 (×2): qty 2
  Filled 2018-07-10 (×4): qty 1
  Filled 2018-07-10 (×2): qty 2

## 2018-07-10 MED ORDER — METHOCARBAMOL 500 MG IVPB - SIMPLE MED
500.0000 mg | Freq: Four times a day (QID) | INTRAVENOUS | Status: DC | PRN
  Administered 2018-07-10: 500 mg via INTRAVENOUS
  Filled 2018-07-10: qty 50

## 2018-07-10 MED ORDER — KETOROLAC TROMETHAMINE 30 MG/ML IJ SOLN
INTRAMUSCULAR | Status: AC
  Filled 2018-07-10: qty 1

## 2018-07-10 MED ORDER — EPHEDRINE 5 MG/ML INJ
INTRAVENOUS | Status: AC
  Filled 2018-07-10: qty 10

## 2018-07-10 MED ORDER — TRANEXAMIC ACID-NACL 1000-0.7 MG/100ML-% IV SOLN
1000.0000 mg | INTRAVENOUS | Status: AC
  Administered 2018-07-10: 1000 mg via INTRAVENOUS
  Filled 2018-07-10: qty 100

## 2018-07-10 MED ORDER — MIDAZOLAM HCL 2 MG/2ML IJ SOLN
INTRAMUSCULAR | Status: AC
  Administered 2018-07-10: 2 mg via INTRAVENOUS
  Filled 2018-07-10: qty 2

## 2018-07-10 MED ORDER — ACETAMINOPHEN 325 MG PO TABS
325.0000 mg | ORAL_TABLET | Freq: Four times a day (QID) | ORAL | Status: DC | PRN
  Administered 2018-07-12 – 2018-07-13 (×2): 650 mg via ORAL
  Filled 2018-07-10 (×2): qty 2

## 2018-07-10 MED ORDER — HYDROMORPHONE HCL 1 MG/ML IJ SOLN
0.5000 mg | INTRAMUSCULAR | Status: DC | PRN
  Administered 2018-07-10: 0.5 mg via INTRAVENOUS
  Administered 2018-07-12 (×3): 1 mg via INTRAVENOUS
  Filled 2018-07-10 (×4): qty 1

## 2018-07-10 MED ORDER — METOCLOPRAMIDE HCL 5 MG/ML IJ SOLN: 5.0000 mg | Freq: Three times a day (TID) | INTRAMUSCULAR | Status: DC | PRN

## 2018-07-10 MED ORDER — SODIUM CHLORIDE (PF) 0.9 % IJ SOLN
INTRAMUSCULAR | Status: AC
  Filled 2018-07-10: qty 50

## 2018-07-10 MED ORDER — ASPIRIN EC 325 MG PO TBEC: 325.0000 mg | DELAYED_RELEASE_TABLET | Freq: Two times a day (BID) | ORAL | 0 refills | Status: AC

## 2018-07-10 MED ORDER — BUPIVACAINE HCL (PF) 0.25 % IJ SOLN
INTRAMUSCULAR | Status: AC
  Filled 2018-07-10: qty 30

## 2018-07-10 MED ORDER — MEPERIDINE HCL 50 MG/ML IJ SOLN: 6.2500 mg | INTRAMUSCULAR | Status: DC | PRN

## 2018-07-10 MED ORDER — FENTANYL CITRATE (PF) 100 MCG/2ML IJ SOLN
50.0000 ug | INTRAMUSCULAR | Status: DC
  Administered 2018-07-10: 100 ug via INTRAVENOUS

## 2018-07-10 MED ORDER — PROPOFOL 500 MG/50ML IV EMUL
INTRAVENOUS | Status: DC | PRN
  Administered 2018-07-10: 75 ug/kg/min via INTRAVENOUS

## 2018-07-10 MED ORDER — METOCLOPRAMIDE HCL 5 MG PO TABS: 5.0000 mg | ORAL_TABLET | Freq: Three times a day (TID) | ORAL | Status: DC | PRN

## 2018-07-10 MED ORDER — SODIUM CHLORIDE 0.9 % IV SOLN
INTRAVENOUS | Status: DC | PRN
  Administered 2018-07-10: 40 ug/min via INTRAVENOUS

## 2018-07-10 MED ORDER — LACTATED RINGERS IV SOLN
INTRAVENOUS | Status: DC
  Administered 2018-07-10 (×2): via INTRAVENOUS

## 2018-07-10 MED ORDER — ONDANSETRON HCL 4 MG/2ML IJ SOLN
INTRAMUSCULAR | Status: DC | PRN
  Administered 2018-07-10: 4 mg via INTRAVENOUS

## 2018-07-10 MED ORDER — DEXAMETHASONE SODIUM PHOSPHATE 10 MG/ML IJ SOLN
INTRAMUSCULAR | Status: AC
  Filled 2018-07-10: qty 1

## 2018-07-10 MED ORDER — BUPIVACAINE IN DEXTROSE 0.75-8.25 % IT SOLN
INTRATHECAL | Status: DC | PRN
  Administered 2018-07-10: 1.8 mL via INTRATHECAL

## 2018-07-10 MED ORDER — ACETAMINOPHEN 500 MG PO TABS
1000.0000 mg | ORAL_TABLET | Freq: Four times a day (QID) | ORAL | Status: AC
  Administered 2018-07-10 – 2018-07-11 (×4): 1000 mg via ORAL
  Filled 2018-07-10 (×4): qty 2

## 2018-07-10 MED ORDER — LEVOTHYROXINE SODIUM 25 MCG PO TABS
25.0000 ug | ORAL_TABLET | Freq: Every day | ORAL | Status: DC
  Administered 2018-07-11 – 2018-07-14 (×4): 25 ug via ORAL
  Filled 2018-07-10 (×5): qty 1

## 2018-07-10 MED ORDER — METHOCARBAMOL 500 MG PO TABS: 500.0000 mg | ORAL_TABLET | Freq: Four times a day (QID) | ORAL | 1 refills | Status: DC

## 2018-07-10 MED ORDER — DIPHENHYDRAMINE HCL 12.5 MG/5ML PO ELIX: 12.5000 mg | ORAL_SOLUTION | ORAL | Status: DC | PRN

## 2018-07-10 MED ORDER — OXYCODONE HCL 5 MG PO TABS: 5.0000 mg | ORAL_TABLET | ORAL | 0 refills | Status: AC | PRN

## 2018-07-10 MED ORDER — MIDAZOLAM HCL 2 MG/2ML IJ SOLN
1.0000 mg | INTRAMUSCULAR | Status: DC
  Administered 2018-07-10: 2 mg via INTRAVENOUS

## 2018-07-10 MED ORDER — PROPOFOL 10 MG/ML IV BOLUS
INTRAVENOUS | Status: DC | PRN
  Administered 2018-07-10: 30 mg via INTRAVENOUS

## 2018-07-10 MED ORDER — CEFAZOLIN SODIUM-DEXTROSE 2-4 GM/100ML-% IV SOLN
2.0000 g | INTRAVENOUS | Status: AC
  Administered 2018-07-10: 2 g via INTRAVENOUS
  Filled 2018-07-10: qty 100

## 2018-07-10 MED ORDER — ALUM & MAG HYDROXIDE-SIMETH 200-200-20 MG/5ML PO SUSP
30.0000 mL | ORAL | Status: DC | PRN
  Administered 2018-07-11: 30 mL via ORAL
  Filled 2018-07-10: qty 30

## 2018-07-10 MED ORDER — OXYCODONE HCL 5 MG PO TABS
5.0000 mg | ORAL_TABLET | ORAL | Status: DC | PRN
  Administered 2018-07-10: 10 mg via ORAL
  Filled 2018-07-10: qty 1
  Filled 2018-07-10 (×2): qty 2

## 2018-07-10 MED ORDER — PROPOFOL 10 MG/ML IV BOLUS
INTRAVENOUS | Status: AC
  Filled 2018-07-10: qty 60

## 2018-07-10 MED ORDER — POLYETHYLENE GLYCOL 3350 17 G PO PACK
17.0000 g | PACK | Freq: Every day | ORAL | Status: DC | PRN
  Administered 2018-07-13: 17 g via ORAL
  Filled 2018-07-10: qty 1

## 2018-07-10 MED ORDER — ONDANSETRON HCL 4 MG/2ML IJ SOLN
INTRAMUSCULAR | Status: AC
  Filled 2018-07-10: qty 2

## 2018-07-10 MED ORDER — SODIUM CHLORIDE (PF) 0.9 % IJ SOLN
INTRAMUSCULAR | Status: DC | PRN
  Administered 2018-07-10: 30 mL

## 2018-07-10 MED ORDER — ENOXAPARIN SODIUM 30 MG/0.3ML ~~LOC~~ SOLN
30.0000 mg | Freq: Two times a day (BID) | SUBCUTANEOUS | Status: DC
  Administered 2018-07-11 – 2018-07-13 (×5): 30 mg via SUBCUTANEOUS
  Filled 2018-07-10 (×5): qty 0.3

## 2018-07-10 MED ORDER — PROMETHAZINE HCL 25 MG/ML IJ SOLN: 6.2500 mg | INTRAMUSCULAR | Status: DC | PRN

## 2018-07-10 MED ORDER — SODIUM CHLORIDE 0.9 % IR SOLN
Status: DC | PRN
  Administered 2018-07-10: 1

## 2018-07-10 MED ORDER — HYDROMORPHONE HCL 1 MG/ML IJ SOLN
INTRAMUSCULAR | Status: AC
  Administered 2018-07-10: 0.5 mg via INTRAVENOUS
  Filled 2018-07-10: qty 1

## 2018-07-10 MED ORDER — GLYCOPYRROLATE 0.2 MG/ML IJ SOLN
INTRAMUSCULAR | Status: DC | PRN
  Administered 2018-07-10: 0.2 mg via INTRAVENOUS

## 2018-07-10 MED ORDER — ONDANSETRON HCL 4 MG PO TABS
4.0000 mg | ORAL_TABLET | Freq: Four times a day (QID) | ORAL | Status: DC | PRN
  Administered 2018-07-12: 4 mg via ORAL
  Filled 2018-07-10: qty 1

## 2018-07-10 MED ORDER — MENTHOL 3 MG MT LOZG
1.0000 | LOZENGE | OROMUCOSAL | Status: DC | PRN
  Filled 2018-07-10: qty 9

## 2018-07-10 MED ORDER — OXYCODONE HCL 5 MG PO TABS
10.0000 mg | ORAL_TABLET | ORAL | Status: DC | PRN
  Administered 2018-07-10 – 2018-07-12 (×8): 15 mg via ORAL
  Filled 2018-07-10 (×7): qty 3

## 2018-07-10 MED ORDER — PROPOFOL 10 MG/ML IV BOLUS
INTRAVENOUS | Status: AC
  Filled 2018-07-10: qty 20

## 2018-07-10 SURGICAL SUPPLY — 73 items
ADH SKN CLS APL DERMABOND .7 (GAUZE/BANDAGES/DRESSINGS) ×1
ATTUNE MED DOME PAT 38 KNEE (Knees) ×1 IMPLANT
ATTUNE MED DOME PAT 38MM KNEE (Knees) ×1 IMPLANT
ATTUNE PS FEM RT SZ 7 CEM KNEE (Femur) ×2 IMPLANT
ATTUNE PSRP INSR SZ7 6 KNEE (Insert) ×1 IMPLANT
ATTUNE PSRP INSR SZ7 6MM KNEE (Insert) ×1 IMPLANT
BAG DECANTER FOR FLEXI CONT (MISCELLANEOUS) IMPLANT
BAG SPEC THK2 15X12 ZIP CLS (MISCELLANEOUS) ×2
BAG ZIPLOCK 12X15 (MISCELLANEOUS) ×6 IMPLANT
BANDAGE ACE 4X5 VEL STRL LF (GAUZE/BANDAGES/DRESSINGS) ×3 IMPLANT
BANDAGE ACE 6X5 VEL STRL LF (GAUZE/BANDAGES/DRESSINGS) ×3 IMPLANT
BASE TIBIAL ROT PLAT SZ 8 KNEE (Knees) IMPLANT
BLADE SAG 18X100X1.27 (BLADE) ×3 IMPLANT
BLADE SAW SGTL 11.0X1.19X90.0M (BLADE) ×3 IMPLANT
BLADE SURG SZ10 CARB STEEL (BLADE) ×6 IMPLANT
BNDG CMPR MED 15X6 ELC VLCR LF (GAUZE/BANDAGES/DRESSINGS) ×1
BNDG ELASTIC 6X15 VLCR STRL LF (GAUZE/BANDAGES/DRESSINGS) ×2 IMPLANT
BOWL SMART MIX CTS (DISPOSABLE) ×3 IMPLANT
BSPLAT TIB 8 CMNT ROT PLAT STR (Knees) ×1 IMPLANT
CEMENT HV SMART SET (Cement) ×4 IMPLANT
COVER SURGICAL LIGHT HANDLE (MISCELLANEOUS) ×3 IMPLANT
COVER WAND RF STERILE (DRAPES) IMPLANT
CUFF TOURN SGL QUICK 34 (TOURNIQUET CUFF) ×3
CUFF TRNQT CYL 34X4X40X1 (TOURNIQUET CUFF) ×1 IMPLANT
DECANTER SPIKE VIAL GLASS SM (MISCELLANEOUS) ×3 IMPLANT
DERMABOND ADVANCED (GAUZE/BANDAGES/DRESSINGS) ×2
DERMABOND ADVANCED .7 DNX12 (GAUZE/BANDAGES/DRESSINGS) ×1 IMPLANT
DRAPE U-SHAPE 47X51 STRL (DRAPES) ×3 IMPLANT
DRESSING AQUACEL AG SP 3.5X10 (GAUZE/BANDAGES/DRESSINGS) IMPLANT
DRSG AQUACEL AG ADV 3.5X10 (GAUZE/BANDAGES/DRESSINGS) ×3 IMPLANT
DRSG AQUACEL AG SP 3.5X10 (GAUZE/BANDAGES/DRESSINGS) ×3
DRSG TEGADERM 4X4.75 (GAUZE/BANDAGES/DRESSINGS) ×3 IMPLANT
DURAPREP 26ML APPLICATOR (WOUND CARE) ×6 IMPLANT
ELECT REM PT RETURN 15FT ADLT (MISCELLANEOUS) ×3 IMPLANT
EVACUATOR 1/8 PVC DRAIN (DRAIN) ×3 IMPLANT
GAUZE SPONGE 2X2 8PLY STRL LF (GAUZE/BANDAGES/DRESSINGS) ×1 IMPLANT
GLOVE BIO SURGEON STRL SZ7.5 (GLOVE) ×6 IMPLANT
GLOVE BIOGEL PI IND STRL 8 (GLOVE) ×2 IMPLANT
GLOVE BIOGEL PI INDICATOR 8 (GLOVE) ×4
GLOVE ECLIPSE 8.0 STRL XLNG CF (GLOVE) ×6 IMPLANT
GLOVE SURG ORTHO 9.0 STRL STRW (GLOVE) ×3 IMPLANT
GOWN STRL REUS W/TWL XL LVL3 (GOWN DISPOSABLE) ×6 IMPLANT
HANDPIECE INTERPULSE COAX TIP (DISPOSABLE) ×3
HOLDER FOLEY CATH W/STRAP (MISCELLANEOUS) IMPLANT
NDL SAFETY ECLIPSE 18X1.5 (NEEDLE) ×1 IMPLANT
NEEDLE HYPO 18GX1.5 SHARP (NEEDLE) ×3
NS IRRIG 1000ML POUR BTL (IV SOLUTION) ×3 IMPLANT
PACK TOTAL KNEE CUSTOM (KITS) ×3 IMPLANT
PIN STEINMAN FIXATION KNEE (PIN) ×2 IMPLANT
PIN THREADED HEADED SIGMA (PIN) ×2 IMPLANT
PROTECTOR NERVE ULNAR (MISCELLANEOUS) ×3 IMPLANT
SET HNDPC FAN SPRY TIP SCT (DISPOSABLE) ×1 IMPLANT
SET PAD KNEE POSITIONER (MISCELLANEOUS) ×3 IMPLANT
SPONGE GAUZE 2X2 STER 10/PKG (GAUZE/BANDAGES/DRESSINGS) ×2
SPONGE LAP 18X18 RF (DISPOSABLE) IMPLANT
SPONGE SURGIFOAM ABS GEL 100 (HEMOSTASIS) ×3 IMPLANT
STOCKINETTE 6  STRL (DRAPES) ×2
STOCKINETTE 6 STRL (DRAPES) ×1 IMPLANT
SUT BONE WAX W31G (SUTURE) IMPLANT
SUT MNCRL AB 3-0 PS2 18 (SUTURE) ×3 IMPLANT
SUT VIC AB 1 CT1 27 (SUTURE) ×12
SUT VIC AB 1 CT1 27XBRD ANTBC (SUTURE) ×4 IMPLANT
SUT VIC AB 2-0 CT1 27 (SUTURE) ×6
SUT VIC AB 2-0 CT1 TAPERPNT 27 (SUTURE) ×2 IMPLANT
SUT VLOC 180 0 24IN GS25 (SUTURE) ×3 IMPLANT
SYR 3ML LL SCALE MARK (SYRINGE) ×3 IMPLANT
SYR 50ML LL SCALE MARK (SYRINGE) ×3 IMPLANT
TAPE STRIPS DRAPE STRL (GAUZE/BANDAGES/DRESSINGS) ×3 IMPLANT
TIBIAL BASE ROT PLAT SZ 8 KNEE (Knees) ×3 IMPLANT
TRAY FOLEY MTR SLVR 16FR STAT (SET/KITS/TRAYS/PACK) ×3 IMPLANT
WATER STERILE IRR 1000ML POUR (IV SOLUTION) ×6 IMPLANT
WRAP KNEE MAXI GEL POST OP (GAUZE/BANDAGES/DRESSINGS) ×3 IMPLANT
YANKAUER SUCT BULB TIP 10FT TU (MISCELLANEOUS) ×3 IMPLANT

## 2018-07-10 NOTE — Anesthesia Postprocedure Evaluation (Signed)
Anesthesia Post Note  Patient: SHAILEN THIELEN  Procedure(s) Performed: TOTAL KNEE ARTHROPLASTY (Right Knee)     Patient location during evaluation: PACU Anesthesia Type: Spinal and Regional Level of consciousness: awake and alert, patient cooperative and oriented Pain management: pain level controlled Vital Signs Assessment: post-procedure vital signs reviewed and stable Respiratory status: spontaneous breathing, nonlabored ventilation and respiratory function stable Cardiovascular status: blood pressure returned to baseline and stable Postop Assessment: no apparent nausea or vomiting and spinal receding Anesthetic complications: no    Last Vitals:  Vitals:   07/10/18 1430 07/10/18 1453  BP: 100/60 110/65  Pulse: (!) 53 (!) 47  Resp: 12   Temp: 36.4 C (!) 36.4 C  SpO2: 99% 97%    Last Pain:  Vitals:   07/10/18 1453  TempSrc: Oral  PainSc:                  Elsey Holts,E. Ranita Stjulien

## 2018-07-10 NOTE — Evaluation (Signed)
Physical Therapy Evaluation Patient Details Name: Frank Weber MRN: [id number] DOB: 1950-06-30 Today's Date: 07/10/2018   History of Present Illness  68 yo male s/p R TKR on 07/10/18. PMH includes OA, IBS, peripheral neuropathy, GERD, HLD, kidney stones with cystoscopy, R knee arthroscopy, L and R RTC repair.   Clinical Impression   Pt presents with R knee pain, decreased R knee ROM, increased time and effort to perform mobility tasks, and decreased tolerance for activity due to R knee pain. Pt to benefit from acute PT to address deficits. Pt ambulated 50 ft with RW with min guard assist, verbal cuing provided throughout. Pt with slight tremors in UEs and LEs during ambulation, pt unsure of onset. Pt educated on ankle pumps (20/hour) to perform this afternoon/evening to increase circulation, to pt's tolerance and limited by pain. PT to progress mobility as tolerated, and will continue to follow acutely.        Follow Up Recommendations Follow surgeon's recommendation for DC plan and follow-up therapies;Supervision for mobility/OOB    Equipment Recommendations  Rolling walker with 5" wheels    Recommendations for Other Services       Precautions / Restrictions Precautions Precautions: Fall Required Braces or Orthoses: Knee Immobilizer - Right Knee Immobilizer - Right: On when out of bed or walking;Discontinue once straight leg raise with < 10 degree lag Restrictions Weight Bearing Restrictions: No Other Position/Activity Restrictions: WBAT       Mobility  Bed Mobility Overal bed mobility: Needs Assistance Bed Mobility: Supine to Sit     Supine to sit: Min guard;HOB elevated     General bed mobility comments: Min guard for safety. Verbal cuing for sequencing to EOB, increased time and effort.   Transfers Overall transfer level: Needs assistance Equipment used: Rolling walker (2 wheeled) Transfers: Sit to/from Stand Sit to Stand: Min guard;From elevated surface         General transfer comment: Min guard for safety. VErbal cuing for hand placement. Increased time to rise, pt with UE shaking with standing and required cuing to step into RW once standing.   Ambulation/Gait Ambulation/Gait assistance: Min guard;+2 safety/equipment Gait Distance (Feet): 50 Feet Assistive device: Rolling walker (2 wheeled) Gait Pattern/deviations: Step-to pattern;Decreased stance time - right;Decreased weight shift to right;Antalgic;Trunk flexed Gait velocity: decr    General Gait Details: Min guard for safety. Pt with mild shaking of UE and LEs, pt states he notices slight tremor but did not affect mobility. Verbal cuing for placement in RW, turning, sequencing.   Stairs            Wheelchair Mobility    Modified Rankin (Stroke Patients Only)       Balance Overall balance assessment: Mild deficits observed, not formally tested                                           Pertinent Vitals/Pain Pain Assessment: 0-10 Pain Score: 5  Pain Location: R knee  Pain Descriptors / Indicators: Aching Pain Intervention(s): Premedicated before session;Limited activity within patient's tolerance;Repositioned;Ice applied;Monitored during session    Home Living Family/patient expects to be discharged to:: Private residence Living Arrangements: Spouse/significant other Available Help at Discharge: Family Type of Home: House Home Access: Stairs to enter Entrance Stairs-Rails: Chemical engineer of Steps: 6 Home Layout: Multi-level;Bed/bath upstairs Home Equipment: Crutches      Prior Function Level of Independence: Independent  Hand Dominance   Dominant Hand: Right    Extremity/Trunk Assessment   Upper Extremity Assessment Upper Extremity Assessment: Overall WFL for tasks assessed    Lower Extremity Assessment Lower Extremity Assessment: Overall WFL for tasks assessed;RLE deficits/detail RLE Deficits /  Details: suspected post-surgical weakness; able to perform ankle pumps, quad sets, heel slides, SLR without assist and some quad lag <10* RLE Sensation: history of peripheral neuropathy    Cervical / Trunk Assessment Cervical / Trunk Assessment: Normal  Communication   Communication: No difficulties  Cognition Arousal/Alertness: Awake/alert Behavior During Therapy: WFL for tasks assessed/performed Overall Cognitive Status: Within Functional Limits for tasks assessed                                        General Comments      Exercises Total Joint Exercises Goniometric ROM: knee aarom ~5-90*, limited by pain    Assessment/Plan    PT Assessment Patient needs continued PT services  PT Problem List Decreased strength;Pain;Decreased range of motion;Decreased activity tolerance;Decreased knowledge of use of DME;Decreased balance;Decreased mobility       PT Treatment Interventions DME instruction;Therapeutic activities;Gait training;Therapeutic exercise;Patient/family education;Stair training;Balance training;Functional mobility training    PT Goals (Current goals can be found in the Care Plan section)  Acute Rehab PT Goals Patient Stated Goal: none stated  PT Goal Formulation: With patient Time For Goal Achievement: 07/17/18 Potential to Achieve Goals: Good    Frequency 7X/week   Barriers to discharge        Co-evaluation               AM-PAC PT "6 Clicks" Mobility  Outcome Measure Help needed turning from your back to your side while in a flat bed without using bedrails?: A Little Help needed moving from lying on your back to sitting on the side of a flat bed without using bedrails?: A Little Help needed moving to and from a bed to a chair (including a wheelchair)?: A Little Help needed standing up from a chair using your arms (e.g., wheelchair or bedside chair)?: A Little Help needed to walk in hospital room?: A Little Help needed climbing 3-5  steps with a railing? : A Little 6 Click Score: 18    End of Session Equipment Utilized During Treatment: Gait belt;Right knee immobilizer Activity Tolerance: Patient tolerated treatment well Patient left: in chair;with chair alarm set;with call bell/phone within reach;with SCD's reapplied Nurse Communication: Mobility status PT Visit Diagnosis: Other abnormalities of gait and mobility (R26.89);Difficulty in walking, not elsewhere classified (R26.2)    Time: 9532-0233 PT Time Calculation (min) (ACUTE ONLY): 22 min   Charges:   PT Evaluation $PT Eval Low Complexity: 1 Low          Cornellius Kropp Conception Chancy, PT Acute Rehabilitation Services Pager [phone]  Office [phone]  Roxine Caddy D Elonda Husky 07/10/2018, 6:57 PM

## 2018-07-10 NOTE — Plan of Care (Signed)
Plan of care 

## 2018-07-10 NOTE — Anesthesia Procedure Notes (Signed)
Spinal  Patient location during procedure: OR Start time: 07/10/2018 10:42 AM End time: 07/10/2018 10:45 AM Reason for block: at surgeon's request Staffing Resident/CRNA: Anne Fu, CRNA Performed: resident/CRNA  Preanesthetic Checklist Completed: patient identified, site marked, surgical consent, pre-op evaluation, timeout performed, IV checked, risks and benefits discussed and monitors and equipment checked Spinal Block Patient position: sitting Prep: DuraPrep Patient monitoring: heart rate, continuous pulse ox and blood pressure Approach: right paramedian Location: L2-3 Injection technique: single-shot Needle Needle type: Pencan  Needle gauge: 24 G Needle length: 9 cm Assessment Sensory level: T6 Additional Notes  Functioning IV was confirmed and monitors were applied. Expiration date of kit checked and confirmed. Sterile prep and drape, including hand hygiene and sterile gloves were used. The patient was positioned and the spine was prepped. The skin was anesthetized with lidocaine.  Free flow of clear CSF was obtained prior to injecting local anesthetic into the CSF X 1 attempt.  The spinal needle aspirated freely following injection.  The needle was carefully withdrawn. Patient tolerated procedure well, without complications. Loss of motor and sensory on exam post injection.

## 2018-07-10 NOTE — Anesthesia Preprocedure Evaluation (Addendum)
Anesthesia Evaluation  Patient identified by MRN, date of birth, ID band Patient awake    Reviewed: Allergy & Precautions, NPO status , Patient's Chart, lab work & pertinent test results  History of Anesthesia Complications Negative for: history of anesthetic complications  Airway Mallampati: II  TM Distance: >3 FB Neck ROM: Full    Dental  (+) Caps, Dental Advisory Given   Pulmonary Current Smoker,    breath sounds clear to auscultation       Cardiovascular (-) angina Rhythm:Regular Rate:Normal  '09 cath: mild 30% proximal stenosis, no other significant coronary artery disease. EF low normal 50%.   Neuro/Psych  Headaches, Lumbar stenosis    GI/Hepatic Neg liver ROS, GERD  Controlled and Medicated,  Endo/Other  Hypothyroidism   Renal/GU negative Renal ROS     Musculoskeletal  (+) Arthritis , Osteoarthritis,    Abdominal   Peds  Hematology negative hematology ROS (+)   Anesthesia Other Findings   Reproductive/Obstetrics                            Anesthesia Physical Anesthesia Plan  ASA: II  Anesthesia Plan: Spinal   Post-op Pain Management:  Regional for Post-op pain   Induction:   PONV Risk Score and Plan: Ondansetron and Treatment may vary due to age or medical condition  Airway Management Planned: Simple Face Mask and Natural Airway  Additional Equipment:   Intra-op Plan:   Post-operative Plan:   Informed Consent: I have reviewed the patients History and Physical, chart, labs and discussed the procedure including the risks, benefits and alternatives for the proposed anesthesia with the patient or authorized representative who has indicated his/her understanding and acceptance.     Dental advisory given  Plan Discussed with: CRNA and Surgeon  Anesthesia Plan Comments: (Plan routine monitors, SAB with adductor canal block for post op analgesia)        Anesthesia  Quick Evaluation

## 2018-07-10 NOTE — Anesthesia Procedure Notes (Signed)
Anesthesia Regional Block: Adductor canal block   Pre-Anesthetic Checklist: ,, timeout performed, Correct Patient, Correct Site, Correct Laterality, Correct Procedure, Correct Position, site marked, Risks and benefits discussed,  Surgical consent,  Pre-op evaluation,  At surgeon's request and post-op pain management  Laterality: Right and Lower  Prep: chloraprep       Needles:  Injection technique: Single-shot     Needle Length: 9cm  Needle Gauge: 21     Additional Needles:   Procedures:,,,, ultrasound used (permanent image in chart),,,,  Narrative:  Start time: 07/10/2018 9:04 AM End time: 07/10/2018 9:10 AM Injection made incrementally with aspirations every 5 mL.  Performed by: Personally  Anesthesiologist: Annye Asa, MD  Additional Notes: Pt identified in Holding room.  Monitors applied. Working IV access confirmed. Sterile prep, drape R thigh.  #21ga ECHOgenic needle into adductor canal with US guidance.  20cc 0.75% Ropivacaine injected incrementally after negative test dose.  Patient asymptomatic, VSS, no heme aspirated, tolerated well.  Jenita Seashore, MD

## 2018-07-10 NOTE — Transfer of Care (Signed)
Immediate Anesthesia Transfer of Care Note  Patient: Frank Weber  Procedure(s) Performed: Procedure(s): TOTAL KNEE ARTHROPLASTY (Right)  Patient Location: PACU  Anesthesia Type:Spinal  Level of Consciousness:  sedated, patient cooperative and responds to stimulation  Airway & Oxygen Therapy:Patient Spontanous Breathing and Patient connected to face mask oxgen  Post-op Assessment:  Report given to PACU RN and Post -op Vital signs reviewed and stable  Post vital signs:  Reviewed and stable  Last Vitals:  Vitals:   07/10/18 0940 07/10/18 0945  BP:    Pulse: (!) 54 (!) 51  Resp: 14 (!) 9  Temp:    SpO2: 58% 72%    Complications: No apparent anesthesia complications

## 2018-07-10 NOTE — Interval H&P Note (Signed)
History and Physical Interval Note:  07/10/2018 7:30 AM  Frank Weber  has presented today for surgery, with the diagnosis of Right knee osteoarthritis  The various methods of treatment have been discussed with the patient and family. After consideration of risks, benefits and other options for treatment, the patient has consented to  Procedure(s): TOTAL KNEE ARTHROPLASTY (Right) as a surgical intervention .  The patient's history has been reviewed, patient examined, no change in status, stable for surgery.  I have reviewed the patient's chart and labs.  Questions were answered to the patient's satisfaction.     [name]

## 2018-07-10 NOTE — Op Note (Signed)
DATE OF SURGERY:  07/10/2018  TIME: 12:27 PM  PATIENT NAME:  Frank Weber    AGE: 68 y.o.   PRE-OPERATIVE DIAGNOSIS:  Right knee osteoarthritis  POST-OPERATIVE DIAGNOSIS:  Right knee osteoarthritis  PROCEDURE:  Procedure(s): TOTAL KNEE ARTHROPLASTY  SURGEON:  Tiasha Helvie ANDREW  ASSISTANT:  Bryson Stilwell, PA-C, present and scrubbed throughout the case, critical for assistance with exposure, retraction, instrumentation, and closure.  OPERATIVE IMPLANTS: DEpuy  PFC Attune  Rotating Platform.  Femur size 7, Tibia size 8, Patella size 35 3-peg oval button, with a 7 mm polyethylene insert.   PREOPERATIVE INDICATIONS:   Frank Weber is a 69 y.o. year old male with end stage bone on bone arthritis of the knee who failed conservative treatment and elected for Total Knee Arthroplasty.   The risks, benefits, and alternatives were discussed at length including but not limited to the risks of infection, bleeding, nerve injury, stiffness, blood clots, the need for revision surgery, cardiopulmonary complications, among others, and they were willing to proceed.  OPERATIVE DESCRIPTION:  The patient was brought to the operative room and placed in a supine position.  Spinal anesthesia was administered.  IV antibiotics were given.  The lower extremity was prepped and draped in the usual sterile fashion.  Time out was performed.  The leg was elevated and exsanguinated and the tourniquet was inflated.  Anterior quadriceps tendon splitting approach was performed.  The patella was retracted and osteophytes were removed.  The anterior horn of the medial and lateral meniscus was removed and cruciate ligaments resected.   The distal femur was opened with the drill and the intramedullary distal femoral cutting jig was utilized, set at 5 degrees resecting 10 mm off the distal femur.  Care was taken to protect the collateral ligaments.  The distal femoral sizing jig was applied, taking care to avoid  notching.  Then the 4-in-1 cutting jig was applied and the anterior and posterior femur was cut, along with the chamfer cuts.    Then the extramedullary tibial cutting jig was utilized making the appropriate cut using the anterior tibial crest as a reference building in appropriate posterior slope.  Care was taken during the cut to protect the medial and collateral ligaments.  The proximal tibia was removed along with the posterior horns of the menisci.   The posterior medial femoral osteophytes and posterior lateral femoral osteophytes were removed.    The flexion gap was then measured and was symmetric with the extension gap, measured at 7.  I completed the distal femoral preparation using the appropriate jig to prepare the box.  The patella was then measured, and cut with the saw.    The proximal tibia sized and prepared accordingly with the reamer and the punch, and then all components were trialed with the trial insert.  The knee was found to have excellent balance and full motion.    The above named components were then cemented into place and all excess cement was removed.  The trial polyethylene component was in place during cementation, and then was exchanged for the real polyethylene component.    The knee was easily taken through a range of motion and the patella tracked well and the knee irrigated copiously and the parapatellar and subcutaneous tissue closed with vicryl, and monocryl with steri strips for the skin.  The arthrotomy was closed at 90 of flexion. The wounds were dressed with sterile gauze and the tourniquet released and the patient was awakened and returned to the  PACU in stable and satisfactory condition.  There were no complications.  Total tourniquet time was 80 minutes.

## 2018-07-11 LAB — CBC
HCT: 39.9 % (ref 39.0–52.0)
Hemoglobin: 12.9 g/dL — ABNORMAL LOW (ref 13.0–17.0)
MCH: 32 pg (ref 26.0–34.0)
MCHC: 32.3 g/dL (ref 30.0–36.0)
MCV: 99 fL (ref 80.0–100.0)
NRBC: 0 % (ref 0.0–0.2)
Platelets: 189 10*3/uL (ref 150–400)
RBC: 4.03 MIL/uL — ABNORMAL LOW (ref 4.22–5.81)
RDW: 12.7 % (ref 11.5–15.5)
WBC: 13.4 10*3/uL — ABNORMAL HIGH (ref 4.0–10.5)

## 2018-07-11 LAB — BASIC METABOLIC PANEL
ANION GAP: 7 (ref 5–15)
BUN: 14 mg/dL (ref 8–23)
CO2: 25 mmol/L (ref 22–32)
Calcium: 8.6 mg/dL — ABNORMAL LOW (ref 8.9–10.3)
Chloride: 103 mmol/L (ref 98–111)
Creatinine, Ser: 1.17 mg/dL (ref 0.61–1.24)
GFR calc non Af Amer: >60 mL/min (ref >60)
Glucose, Bld: 138 mg/dL — ABNORMAL HIGH (ref 70–99)
Potassium: 4.1 mmol/L (ref 3.5–5.1)
Sodium: 135 mmol/L (ref 135–145)

## 2018-07-11 NOTE — Progress Notes (Signed)
   Subjective: 1 Day Post-Op Procedure(s) (LRB): TOTAL KNEE ARTHROPLASTY (Right)  Pt doing well Working with therapy doing well with minimal soreness C/o mild right ankle pain that he has previous history  Patient reports pain as mild.  Objective:   VITALS:   Vitals:   07/11/18 0540 07/11/18 0638  BP:  (!) 96/58  Pulse: (!) 53 (!) 56  Resp:  16  Temp:    SpO2:  97%    Right knee dressing intact Flexion beyond 90 degrees with therapy Drain pulled nv intact distally Minimal edema to right ankle/foot  LABS Recent Labs    07/11/18 0457  HGB 12.9*  HCT 39.9  WBC 13.4*  PLT 189    Recent Labs    07/11/18 0457  NA 135  K 4.1  BUN 14  CREATININE 1.17  GLUCOSE 138*     Assessment/Plan: 1 Day Post-Op Procedure(s) (LRB): TOTAL KNEE ARTHROPLASTY (Right) PT/OT Expect d/c tomorrow Overall doing well with therapy  Pain management as needed    Kathrynn Speed, MPAS [place] is now [employer] [facility] [address], [place], [place], [place] [address] Phone: [phone] [email or web address] Facebook  [employer]

## 2018-07-11 NOTE — Progress Notes (Signed)
Physical Therapy Treatment Patient Details Name: Frank Weber MRN: [id number] DOB: [birth year] Today's Date: 07/11/2018    History of Present Illness 68 yo male s/p R TKR on 07/10/18. PMH includes OA, IBS, peripheral neuropathy, GERD, HLD, kidney stones with cystoscopy, R knee arthroscopy, L and R RTC repair.     PT Comments    Pt is progressing quite well with mobility, he ambulated 250' with RW, is independent with SLR, and R knee AAROM is 5-90*. Instructed pt in TKA HEP, he demonstrates good understanding.   Follow Up Recommendations  Follow surgeon's recommendation for DC plan and follow-up therapies;Supervision for mobility/OOB     Equipment Recommendations  Rolling walker with 5" wheels;3in1 (PT)    Recommendations for Other Services       Precautions / Restrictions Precautions Precautions: Fall Required Braces or Orthoses: Knee Immobilizer - Right Knee Immobilizer - Right: On when out of bed or walking;Discontinue once straight leg raise with < 10 degree lag Restrictions Weight Bearing Restrictions: No Other Position/Activity Restrictions: WBAT     Mobility  Bed Mobility Overal bed mobility: Modified Independent Bed Mobility: Supine to Sit     Supine to sit: HOB elevated;Modified independent (Device/Increase time)        Transfers Overall transfer level: Needs assistance Equipment used: Rolling walker (2 wheeled) Transfers: Sit to/from Stand Sit to Stand: Min guard;From elevated surface         General transfer comment: VCs hand placement  Ambulation/Gait Ambulation/Gait assistance: Min guard Gait Distance (Feet): 250 Feet Assistive device: Rolling walker (2 wheeled) Gait Pattern/deviations: Decreased stance time - right;Decreased weight shift to right;Antalgic;Step-through pattern Gait velocity: decr    General Gait Details: VCs for sequencing initially   Stairs             Wheelchair Mobility    Modified Rankin (Stroke Patients Only)       Balance Overall balance assessment: Mild deficits observed, not formally tested                                          Cognition Arousal/Alertness: Awake/alert Behavior During Therapy: WFL for tasks assessed/performed Overall Cognitive Status: Within Functional Limits for tasks assessed                                        Exercises Total Joint Exercises Ankle Circles/Pumps: AROM;Both;10 reps;Supine Quad Sets: AROM;Right;5 reps;Supine Short Arc Quad: AROM;Right;10 reps;Supine Heel Slides: AAROM;Right;10 reps;Supine Hip ABduction/ADduction: AROM;Right;10 reps;Supine Straight Leg Raises: AROM;Right;10 reps;Supine Long Arc Quad: AROM;Right;10 reps;Seated Knee Flexion: AAROM;AROM;Right;15 reps;Seated Goniometric ROM: AAROM R knee 5-90*    General Comments        Pertinent Vitals/Pain Pain Score: 6  Pain Location: R knee  Pain Descriptors / Indicators: Aching Pain Intervention(s): Limited activity within patient's tolerance;Monitored during session;Premedicated before session;Ice applied    Home Living                      Prior Function            PT Goals (current goals Frank now be found in the care plan section) Acute Rehab PT Goals Patient Stated Goal: home repairs, cooking, walk without pain PT Goal Formulation: With patient Time For Goal Achievement: 07/17/18 Potential to Achieve Goals: Good Progress towards  PT goals: Progressing toward goals    Frequency    7X/week      PT Plan      Co-evaluation              AM-PAC PT "6 Clicks" Mobility   Outcome Measure  Help needed turning from your back to your side while in a flat bed without using bedrails?: A Little Help needed moving from lying on your back to sitting on the side of a flat bed without using bedrails?: A Little Help needed moving to and from a bed to a chair (including a wheelchair)?: A Little Help needed standing up from a chair  using your arms (e.g., wheelchair or bedside chair)?: A Little Help needed to walk in hospital room?: A Little Help needed climbing 3-5 steps with a railing? : A Little 6 Click Score: 18    End of Session Equipment Utilized During Treatment: Gait belt(DCed KI, pt independent with SLR) Activity Tolerance: Patient tolerated treatment well Patient left: in chair;with call bell/phone within reach Nurse Communication: Mobility status PT Visit Diagnosis: Other abnormalities of gait and mobility (R26.89);Difficulty in walking, not elsewhere classified (R26.2);Pain Pain - Right/Left: Right Pain - part of body: Knee     Time: 4431-5400 PT Time Calculation (min) (ACUTE ONLY): 45 min  Charges:  $Gait Training: 8-22 mins $Therapeutic Exercise: 8-22 mins $Therapeutic Activity: 8-22 mins                     Blondell Reveal Kistler PT 07/11/2018  Acute Rehabilitation Services Pager [phone] Office [phone]

## 2018-07-11 NOTE — Progress Notes (Signed)
Physical Therapy Treatment Patient Details Name: Frank Weber MRN: [id number] DOB: 16-Dec-1950 Today's Date: 07/11/2018    History of Present Illness 68 yo male s/p R TKR on 07/10/18. PMH includes OA, IBS, peripheral neuropathy, GERD, HLD, kidney stones with cystoscopy, R knee arthroscopy, L and R RTC repair.     PT Comments    Pt is progressing well with mobility, he ambulated 280' with RW, no loss of balance. AAROM R knee 5-95*, pt is independent with SLR. Will plan to do stair training tomorrow morning, then expect pt will be ready to DC home from PT standpoint.    Follow Up Recommendations  Follow surgeon's recommendation for DC plan and follow-up therapies;Supervision for mobility/OOB     Equipment Recommendations  Rolling walker with 5" wheels;3in1 (PT)    Recommendations for Other Services       Precautions / Restrictions Precautions Precautions: Fall Required Braces or Orthoses: Knee Immobilizer - Right Knee Immobilizer - Right: On when out of bed or walking;Discontinue once straight leg raise with < 10 degree lag Restrictions Weight Bearing Restrictions: No Other Position/Activity Restrictions: WBAT     Mobility  Bed Mobility               General bed mobility comments: up in recliner  Transfers Overall transfer level: Needs assistance Equipment used: Rolling walker (2 wheeled) Transfers: Sit to/from Stand Sit to Stand: From elevated surface;Supervision         General transfer comment: VCs hand placement  Ambulation/Gait Ambulation/Gait assistance: Supervision Gait Distance (Feet): 280 Feet Assistive device: Rolling walker (2 wheeled) Gait Pattern/deviations: Decreased stance time - right;Decreased weight shift to right;Antalgic;Step-through pattern Gait velocity: decr    General Gait Details: VCs for sequencing initially   Stairs             Wheelchair Mobility    Modified Rankin (Stroke Patients Only)       Balance Overall  balance assessment: Mild deficits observed, not formally tested                                          Cognition Arousal/Alertness: Awake/alert Behavior During Therapy: WFL for tasks assessed/performed Overall Cognitive Status: Within Functional Limits for tasks assessed                                        Exercises Total Joint Exercises Ankle Circles/Pumps: AROM;Both;10 reps;Supine Quad Sets: AROM;Right;5 reps;Supine Knee Flexion: AAROM;AROM;Right;15 reps;Seated Goniometric ROM: AAROM R knee 5-95*     General Comments        Pertinent Vitals/Pain Pain Score: 4  Pain Location: R knee  Pain Descriptors / Indicators: Sore Pain Intervention(s): Limited activity within patient's tolerance;Monitored during session;Premedicated before session;Ice applied    Home Living                      Prior Function            PT Goals (current goals can now be found in the care plan section) Acute Rehab PT Goals Patient Stated Goal: home repairs, cooking, walk without pain PT Goal Formulation: With patient Time For Goal Achievement: 07/17/18 Potential to Achieve Goals: Good Progress towards PT goals: Progressing toward goals    Frequency    7X/week  PT Plan Current plan remains appropriate    Co-evaluation              AM-PAC PT "6 Clicks" Mobility   Outcome Measure  Help needed turning from your back to your side while in a flat bed without using bedrails?: A Little Help needed moving from lying on your back to sitting on the side of a flat bed without using bedrails?: A Little Help needed moving to and from a bed to a chair (including a wheelchair)?: A Little Help needed standing up from a chair using your arms (e.g., wheelchair or bedside chair)?: A Little Help needed to walk in hospital room?: A Little Help needed climbing 3-5 steps with a railing? : A Little 6 Click Score: 18    End of Session Equipment  Utilized During Treatment: Gait belt(DCed KI, pt independent with SLR) Activity Tolerance: Patient tolerated treatment well Patient left: in chair;with call bell/phone within reach Nurse Communication: Mobility status PT Visit Diagnosis: Other abnormalities of gait and mobility (R26.89);Difficulty in walking, not elsewhere classified (R26.2);Pain Pain - Right/Left: Right Pain - part of body: Knee     Time: 4315-4008 PT Time Calculation (min) (ACUTE ONLY): 22 min  Charges:  $Gait Training: 8-22 mins                     Blondell Reveal Kistler PT 07/11/2018  Acute Rehabilitation Services Pager [phone] Office [phone]

## 2018-07-12 LAB — CBC
HCT: 35.2 % — ABNORMAL LOW (ref 39.0–52.0)
Hemoglobin: 11.4 g/dL — ABNORMAL LOW (ref 13.0–17.0)
MCH: 32.3 pg (ref 26.0–34.0)
MCHC: 32.4 g/dL (ref 30.0–36.0)
MCV: 99.7 fL (ref 80.0–100.0)
Platelets: 183 10*3/uL (ref 150–400)
RBC: 3.53 MIL/uL — ABNORMAL LOW (ref 4.22–5.81)
RDW: 12.9 % (ref 11.5–15.5)
WBC: 10.1 10*3/uL (ref 4.0–10.5)
nRBC: 0 % (ref 0.0–0.2)

## 2018-07-12 MED ORDER — TAMSULOSIN HCL 0.4 MG PO CAPS
0.4000 mg | ORAL_CAPSULE | Freq: Every day | ORAL | Status: DC
  Administered 2018-07-12 – 2018-07-14 (×3): 0.4 mg via ORAL
  Filled 2018-07-12 (×3): qty 1

## 2018-07-12 MED ORDER — HYDROMORPHONE HCL 2 MG PO TABS
2.0000 mg | ORAL_TABLET | ORAL | Status: DC | PRN
  Administered 2018-07-12 – 2018-07-13 (×6): 4 mg via ORAL
  Administered 2018-07-14 (×2): 2 mg via ORAL
  Filled 2018-07-12 (×4): qty 2
  Filled 2018-07-12: qty 1
  Filled 2018-07-12 (×2): qty 2
  Filled 2018-07-12: qty 1

## 2018-07-12 NOTE — Progress Notes (Signed)
     Subjective: 2 Days Post-Op Procedure(s) (LRB): TOTAL KNEE ARTHROPLASTY (Right)   Patient reports pain as severe, rating it a 9/10 out of 10 at all times. Discussed changing meds as the Dilaudid does hep him some in the IV.  No events throughout the night.  Discussed working with PT and trying to improve.      Objective:   VITALS:   Vitals:   07/11/18 2102 07/12/18 0551  BP: 113/83 102/74  Pulse: (!) 57 62  Resp: 15 17  Temp: 98.1 F (36.7 C) 97.9 F (36.6 C)  SpO2: 94% 97%    Dorsiflexion/Plantar flexion intact Incision: moderate drainage No cellulitis present Compartment soft  LABS Recent Labs    07/11/18 0457 07/12/18 0426  HGB 12.9* 11.4*  HCT 39.9 35.2*  WBC 13.4* 10.1  PLT 189 183    Recent Labs    07/11/18 0457  NA 135  K 4.1  BUN 14  CREATININE 1.17  GLUCOSE 138*     Assessment/Plan: 2 Days Post-Op Procedure(s) (LRB): TOTAL KNEE ARTHROPLASTY (Right)  Change from Oxycodone to Dilaudid PO. Up with therapy Discharge home eventually when ready   [place]. Amory Zbikowski   PAC  07/12/2018, 8:44 AM

## 2018-07-12 NOTE — Progress Notes (Signed)
Physical Therapy Treatment Patient Details Name: Frank Weber MRN: [id number] DOB: [birth year] Today's Date: 07/12/2018    History of Present Illness 68 yo male s/p R TKR on 07/10/18. PMH includes OA, IBS, peripheral neuropathy, GERD, HLD, kidney stones with cystoscopy, R knee arthroscopy, L and R RTC repair.     PT Comments    Pt very motivated but struggling with marked increase in pain despite change in pain meds.  Pt requests to attempt ambulation this pm and able to walk short distance in hall and negotiate stairs with assist.  RN aware of pain level.[place] Pager [phone] Office [phone]   Follow Up Recommendations  Follow surgeon's recommendation for DC plan and follow-up therapies;Supervision for mobility/OOB     Equipment Recommendations  Rolling walker with 5" wheels;3in1 (PT)    Recommendations for Other Services       Precautions / Restrictions Precautions Precautions: Fall Required Braces or Orthoses: Knee Immobilizer - Right Knee Immobilizer - Right: On when out of bed or walking;Discontinue once straight leg raise with < 10 degree lag Restrictions Weight Bearing Restrictions: No Other Position/Activity Restrictions: WBAT     Mobility  Bed Mobility               General bed mobility comments: NT - pt up in chair and requests back to same  Transfers Overall transfer level: Needs assistance Equipment used: Rolling walker (2 wheeled) Transfers: Sit to/from Stand Sit to Stand: Min guard         General transfer comment: VCs hand placement  Ambulation/Gait Ambulation/Gait assistance: Min guard;Supervision Gait Distance (Feet): 100 Feet Assistive device: Rolling walker (2 wheeled) Gait Pattern/deviations: Decreased stance time - right;Decreased weight shift to right;Antalgic;Step-to pattern Gait velocity: decr    General Gait Details: cues for position from RW and R foot placement between hands for  support   Stairs Stairs: Yes Stairs assistance: Min assist Stair Management: One rail Right;Step to pattern;Forwards;With crutches Number of Stairs: 2 General stair comments: cues for sequence and foot/crutch placement   Wheelchair Mobility    Modified Rankin (Stroke Patients Only)       Balance Overall balance assessment: Mild deficits observed, not formally tested                                          Cognition Arousal/Alertness: Awake/alert Behavior During Therapy: WFL for tasks assessed/performed Overall Cognitive Status: Within Functional Limits for tasks assessed                                        Exercises Total Joint Exercises Ankle Circles/Pumps: AROM;Both;10 reps;Supine Quad Sets: AROM;Right;5 reps;Supine Straight Leg Raises: AROM;Right;Supine;Other reps (comment)(1 rep)    General Comments        Pertinent Vitals/Pain Pain Assessment: 0-10 Pain Score: 10-Worst pain ever Pain Location: R knee and thigh Pain Descriptors / Indicators: Aching;Throbbing;Sore Pain Intervention(s): Limited activity within patient's tolerance;Monitored during session;Premedicated before session;Ice applied;Heat applied(ice to knee, heat to thigh)    Home Living                      Prior Function            PT Goals (current goals can now be found in the care plan  section) Acute Rehab PT Goals Patient Stated Goal: home repairs, cooking, walk without pain PT Goal Formulation: With patient Time For Goal Achievement: 07/17/18 Potential to Achieve Goals: Good Progress towards PT goals: Progressing toward goals    Frequency    7X/week      PT Plan Current plan remains appropriate    Co-evaluation              AM-PAC PT "6 Clicks" Mobility   Outcome Measure  Help needed turning from your back to your side while in a flat bed without using bedrails?: A Little Help needed moving from lying on your back to  sitting on the side of a flat bed without using bedrails?: A Little Help needed moving to and from a bed to a chair (including a wheelchair)?: A Little Help needed standing up from a chair using your arms (e.g., wheelchair or bedside chair)?: A Little Help needed to walk in hospital room?: A Little Help needed climbing 3-5 steps with a railing? : A Little 6 Click Score: 18    End of Session Equipment Utilized During Treatment: Gait belt Activity Tolerance: Patient limited by pain Patient left: in chair;with call bell/phone within reach;with family/visitor present Nurse Communication: Mobility status PT Visit Diagnosis: Other abnormalities of gait and mobility (R26.89);Difficulty in walking, not elsewhere classified (R26.2);Pain Pain - Right/Left: Right Pain - part of body: Knee     Time: 1255-1320 PT Time Calculation (min) (ACUTE ONLY): 25 min  Charges:  $Gait Training: 8-22 mins                     Hun    Frank Weber 07/12/2018, 3:08 PM

## 2018-07-12 NOTE — Care Management Note (Signed)
Case Management Note  Patient Details  Name: Frank Weber MRN: [id number] Date of Birth: 1951/03/16  Subjective/Objective:     S/p R TKR               Action/Plan: NCM spoke to pt and offered choice for HH/CMS list provided and placed on chart. Pt agreeable to [facility] for HHPT. Contacted [facility] rep, Brad with new referral. Contacted [facility] for RW and 3n1 bedside commode to be delivered to room prior to dc. Wife will assist at home as needed.   Expected Discharge Date:  07/10/18               Expected Discharge Plan:  [place]  In-House Referral:  NA  Discharge planning Services  CM Consult  Post Acute Care Choice:  Home Health Choice offered to:  Patient  DME Arranged:  3-N-1, Walker rolling DME Agency:  [place]:  PT [place]:  [place]  Status of Service:  Completed, signed off  If discussed at [place] of Stay Meetings, dates discussed:    Additional Comments:  Erenest Rasher, RN 07/12/2018, 12:21 PM

## 2018-07-13 ENCOUNTER — Encounter (HOSPITAL_COMMUNITY): Payer: Self-pay | Admitting: Specialist

## 2018-07-13 ENCOUNTER — Ambulatory Visit (HOSPITAL_COMMUNITY): Payer: Medicare Other

## 2018-07-13 DIAGNOSIS — Z96651 Presence of right artificial knee joint: Secondary | ICD-10-CM | POA: Diagnosis not present

## 2018-07-13 DIAGNOSIS — M79651 Pain in right thigh: Secondary | ICD-10-CM | POA: Diagnosis not present

## 2018-07-13 DIAGNOSIS — Z471 Aftercare following joint replacement surgery: Secondary | ICD-10-CM | POA: Diagnosis not present

## 2018-07-13 LAB — CBC
HEMATOCRIT: 34 % — AB (ref 39.0–52.0)
Hemoglobin: 11 g/dL — ABNORMAL LOW (ref 13.0–17.0)
MCH: 33 pg (ref 26.0–34.0)
MCHC: 32.4 g/dL (ref 30.0–36.0)
MCV: 102.1 fL — AB (ref 80.0–100.0)
Platelets: 180 10*3/uL (ref 150–400)
RBC: 3.33 MIL/uL — ABNORMAL LOW (ref 4.22–5.81)
RDW: 12.8 % (ref 11.5–15.5)
WBC: 8.7 10*3/uL (ref 4.0–10.5)
nRBC: 0 % (ref 0.0–0.2)

## 2018-07-13 MED ORDER — ACETAMINOPHEN 500 MG PO TABS
1000.0000 mg | ORAL_TABLET | Freq: Two times a day (BID) | ORAL | Status: DC
  Administered 2018-07-13 – 2018-07-14 (×2): 1000 mg via ORAL
  Filled 2018-07-13 (×2): qty 2

## 2018-07-13 MED ORDER — METHOCARBAMOL 500 MG IVPB - SIMPLE MED
500.0000 mg | Freq: Four times a day (QID) | INTRAVENOUS | Status: DC | PRN
  Filled 2018-07-13: qty 50

## 2018-07-13 MED ORDER — HYDROMORPHONE HCL 1 MG/ML IJ SOLN
0.5000 mg | INTRAMUSCULAR | Status: DC | PRN
  Administered 2018-07-13 (×3): 2 mg via INTRAVENOUS
  Filled 2018-07-13 (×3): qty 2

## 2018-07-13 MED ORDER — KETOROLAC TROMETHAMINE 15 MG/ML IJ SOLN
7.5000 mg | Freq: Four times a day (QID) | INTRAMUSCULAR | Status: AC
  Administered 2018-07-13 – 2018-07-14 (×4): 7.5 mg via INTRAVENOUS
  Filled 2018-07-13 (×4): qty 1

## 2018-07-13 MED ORDER — METHOCARBAMOL 500 MG PO TABS
750.0000 mg | ORAL_TABLET | Freq: Four times a day (QID) | ORAL | Status: DC | PRN
  Administered 2018-07-13: 750 mg via ORAL
  Filled 2018-07-13: qty 2

## 2018-07-13 MED ORDER — ASPIRIN 81 MG PO CHEW
81.0000 mg | CHEWABLE_TABLET | Freq: Two times a day (BID) | ORAL | Status: DC
  Administered 2018-07-14: 81 mg via ORAL
  Filled 2018-07-13: qty 1

## 2018-07-13 NOTE — Progress Notes (Signed)
Physical Therapy Treatment Patient Details Name: Frank Weber MRN: [id number] DOB: 1950/09/01 Today's Date: 07/13/2018    History of Present Illness 68 yo male s/p R TKR on 07/10/18. PMH includes OA, IBS, peripheral neuropathy, GERD, HLD, kidney stones with cystoscopy, R knee arthroscopy, L and R RTC repair.     PT Comments    Pt  Is motivated to work with PT,  Moving well but Continues to be limited by pain. Will follow  Follow Up Recommendations  Follow surgeon's recommendation for DC plan and follow-up therapies;Supervision for mobility/OOB     Equipment Recommendations  Rolling walker with 5" wheels;3in1 (PT)    Recommendations for Other Services       Precautions / Restrictions Precautions Precautions: Fall Required Braces or Orthoses: Knee Immobilizer - Right Restrictions Weight Bearing Restrictions: No Other Position/Activity Restrictions: WBAT     Mobility  Bed Mobility Overal bed mobility: Modified Independent;Needs Assistance Bed Mobility: Supine to Sit     Supine to sit: Min assist     General bed mobility comments: assist for RLE  Transfers Overall transfer level: Needs assistance Equipment used: Rolling walker (2 wheeled) Transfers: Sit to/from Stand Sit to Stand: Supervision;Min guard         General transfer comment: VCs hand placement  Ambulation/Gait Ambulation/Gait assistance: Min guard;Supervision Gait Distance (Feet): 260 Feet Assistive device: Rolling walker (2 wheeled) Gait Pattern/deviations: Decreased stance time - right;Decreased weight shift to right;Antalgic;Step-to pattern Gait velocity: decr    General Gait Details: improved wt shift to right with incr distance, pain not elevated with mobility   Stairs             Wheelchair Mobility    Modified Rankin (Stroke Patients Only)       Balance                                            Cognition Arousal/Alertness: Awake/alert Behavior During  Therapy: WFL for tasks assessed/performed Overall Cognitive Status: Within Functional Limits for tasks assessed                                        Exercises Total Joint Exercises Ankle Circles/Pumps: AROM;Both;10 reps;Supine    General Comments        Pertinent Vitals/Pain Pain Assessment: 0-10 Pain Score: 8  Pain Location: R knee and thigh Pain Descriptors / Indicators: Aching;Throbbing;Sore Pain Intervention(s): Limited activity within patient's tolerance;Monitored during session;Premedicated before session;Repositioned;Ice applied    Home Living                      Prior Function            PT Goals (current goals can now be found in the care plan section) Acute Rehab PT Goals Patient Stated Goal: home repairs, cooking, walk without pain PT Goal Formulation: With patient Time For Goal Achievement: 07/17/18 Potential to Achieve Goals: Good Progress towards PT goals: Progressing toward goals    Frequency    7X/week      PT Plan Current plan remains appropriate    Co-evaluation              AM-PAC PT "6 Clicks" Mobility   Outcome Measure  Help needed turning from your back to your side while  in a flat bed without using bedrails?: A Little Help needed moving from lying on your back to sitting on the side of a flat bed without using bedrails?: A Little Help needed moving to and from a bed to a chair (including a wheelchair)?: A Little Help needed standing up from a chair using your arms (e.g., wheelchair or bedside chair)?: A Little Help needed to walk in hospital room?: A Little Help needed climbing 3-5 steps with a railing? : A Lot 6 Click Score: 17    End of Session Equipment Utilized During Treatment: Gait belt Activity Tolerance: Patient tolerated treatment well;Patient limited by pain;No increased pain Patient left: in chair;with call bell/phone within reach Nurse Communication: Mobility status PT Visit Diagnosis:  Other abnormalities of gait and mobility (R26.89);Difficulty in walking, not elsewhere classified (R26.2);Pain Pain - Right/Left: Right Pain - part of body: Knee     Time: 1210-1229 PT Time Calculation (min) (ACUTE ONLY): 19 min  Charges:  $Gait Training: 8-22 mins                     Kenyon Ana, PT  Pager: [phone] Acute Rehab Dept [facility]): [phone]   07/13/2018    [facility] 07/13/2018, 1:19 PM

## 2018-07-13 NOTE — Progress Notes (Signed)
At 1255, Frank Prince, PA was notified regarding the pt continuing to bleed from his incision site. Babish advised to change the dressing with gauze and tape PRN,  continue to monitor the drainage and apply a new silver hydrofiber prior to d/c home tomorrow.

## 2018-07-13 NOTE — Progress Notes (Signed)
Called Ortho PA, [facility], regarding uncontrolled right leg pain 3 days post op.  Orders given to increase Dilaudid IV dosage and frequency.

## 2018-07-13 NOTE — Progress Notes (Signed)
Subjective: 3 Days Post-Op Procedure(s) (LRB): TOTAL KNEE ARTHROPLASTY (Right) Patient reports pain as 4 on 0-10 scale.  Patient states pain is lessening . Some thigh to foot pain and tingling in foot, No LBP.  Objective: Vital signs in last 24 hours: Temp:  [98 F (36.7 C)-98.4 F (36.9 C)] 98.1 F (36.7 C) (02/03 1311) Pulse Rate:  [71-79] 77 (02/03 1311) Resp:  [16] 16 (02/03 1311) BP: (107-111)/(70-73) 107/70 (02/03 1311) SpO2:  [93 %-95 %] 93 % (02/03 1311)  Intake/Output from previous day: 02/02 0701 - 02/03 0700 In: 600 [P.O.:600] Out: 2475 [Urine:2475] Intake/Output this shift: Total I/O In: 720 [P.O.:720] Out: 1150 [Urine:1150]  Recent Labs    07/11/18 0457 07/12/18 0426 07/13/18 0424  HGB 12.9* 11.4* 11.0*   Recent Labs    07/12/18 0426 07/13/18 0424  WBC 10.1 8.7  RBC 3.53* 3.33*  HCT 35.2* 34.0*  PLT 183 180   Recent Labs    07/11/18 0457  NA 135  K 4.1  CL 103  CO2 25  BUN 14  CREATININE 1.17  GLUCOSE 138*  CALCIUM 8.6*   No results for input(s): LABPT, INR in the last 72 hours.  Sensation intact distallymoderate swelling thigh to foot. Compartments soft no evidence of compartment syndrome nor DVT. Pulses intact foot/ankle normal active and passive movement without pain. Wound swollen and scant bloody drainage no blisters looks ok.  Anticipated LOS equal to or greater than 2 midnights due to - Age 10 and older with one or more of the following:  - Obesity  - Expected need for Weber services (PT, OT, Nursing) required for safe  discharge  - Anticipated need for postoperative skilled nursing care or inpatient rehab  - Active co-morbidities: Chronic pain requiring opiods OR   - Unanticipated findings during/Post Surgery: Slow post-op progression: GI, pain control, mobility  - Patient is a high risk of re-admission due to: Pain   Assessment/Plan: 3 Days Post-Op Procedure(s) (LRB): TOTAL KNEE ARTHROPLASTY (Right) Plan for discharge  tomorrow Discussed with Dr Alvan Dame and read Mr Frank Weber  note. Appreciate all the help. Overall, nothing else to change or do. Needs time, compression, ice , and elevation.As of now plan on DC in am> Discussed with patient and he is in agreement. If still has tingling in am will consider add a Medrol dose pac for six days.   Frank Weber Frank Weber 07/13/2018, 6:29 PM

## 2018-07-13 NOTE — Progress Notes (Addendum)
At [place], PA was paged regarding the report I received that the pt has been at a 10/10 pain level throughout the night. The pt has increased drainage at the incision site, tightness, +2 pedal pulse, and warm to the touch. The pt has been medicated and his lower extremities are elevated without bending the operative knee. Verlin Fester indicated that Alvan Dame, MD would be rounding on the pt this morning, if not, he requested that I let him know. I will continue to monitor the pt.   Wyatt Portela, PA rounded on the pt. He advised to change the silver Hydrofiber dressing, and continue to elevate the leg and apply ice packs. I will continue to monitor the pt and the incision site.

## 2018-07-13 NOTE — Progress Notes (Signed)
   07/13/18 1500  PT Visit Information  Last PT Received On 07/13/18 continues with pain and some swelling/drainage; pt motivated and moving knee well despite pain, see below for details  Assistance Needed +1  History of Present Illness 68 yo male s/p R TKR on 07/10/18. PMH includes OA, IBS, peripheral neuropathy, GERD, HLD, kidney stones with cystoscopy, R knee arthroscopy, L and R RTC repair.   Subjective Data  Subjective Pain so much worse  Patient Stated Goal home repairs, cooking, walk without pain  Precautions  Precautions Fall  Required Braces or Orthoses Knee Immobilizer - Right  Knee Immobilizer - Right On when out of bed or walking;Discontinue once straight leg raise with < 10 degree lag  Restrictions  Weight Bearing Restrictions No  Other Position/Activity Restrictions WBAT   Pain Assessment  Pain Assessment 0-10  Pain Score 7  Pain Location R knee and thigh  Pain Descriptors / Indicators Aching;Throbbing;Sore  Pain Intervention(s) Limited activity within patient's tolerance;Monitored during session;Premedicated before session;Repositioned  Cognition  Arousal/Alertness Awake/alert  Behavior During Therapy WFL for tasks assessed/performed  Overall Cognitive Status Within Functional Limits for tasks assessed  Bed Mobility  Bed Mobility Sit to Supine  Sit to supine Min guard  Total Joint Exercises  Ankle Circles/Pumps AROM;Both;10 reps;Supine  Quad Sets AROM;Right;Supine;10 reps  Heel Slides AAROM;Right;10 reps;Supine  Hip ABduction/ADduction AROM;Right;10 reps;Supine  Straight Leg Raises AAROM;Right;5 reps;Limitations  Long Arc Quad AROM;Right;10 reps;Seated  Knee Flexion AAROM;AROM;Right;Seated;10 reps  Goniometric ROM grossly ~6* to 82* AAROM right knee flexion  Straight Leg Raises Limitations fatigue  PT - End of Session  Activity Tolerance Patient tolerated treatment well  Patient left in bed;with call bell/phone within reach;with nursing/sitter in room   PT -  Assessment/Plan  PT Plan Current plan remains appropriate  PT Visit Diagnosis Other abnormalities of gait and mobility (R26.89);Difficulty in walking, not elsewhere classified (R26.2);Pain  Pain - Right/Left Right  Pain - part of body Knee  PT Frequency (ACUTE ONLY) 7X/week  Follow Up Recommendations Follow surgeon's recommendation for DC plan and follow-up therapies;Supervision for mobility/OOB  PT equipment Rolling walker with 5" wheels;3in1 (PT)  AM-PAC PT "6 Clicks" Mobility Outcome Measure (Version 2)  Help needed turning from your back to your side while in a flat bed without using bedrails? 3  Help needed moving from lying on your back to sitting on the side of a flat bed without using bedrails? 3  Help needed moving to and from a bed to a chair (including a wheelchair)? 3  Help needed standing up from a chair using your arms (e.g., wheelchair or bedside chair)? 3  Help needed to walk in hospital room? 3  Help needed climbing 3-5 steps with a railing?  2  6 Click Score 17  Consider Recommendation of Discharge To: Home with [facility]  PT Goal Progression  Progress towards PT goals Progressing toward goals  Acute Rehab PT Goals  PT Goal Formulation With patient  Time For Goal Achievement 07/17/18  Potential to Achieve Goals Good  PT Time Calculation  PT Start Time (ACUTE ONLY) 1446  PT Stop Time (ACUTE ONLY) 1502  PT Time Calculation (min) (ACUTE ONLY) 16 min  PT General Charges  $$ ACUTE PT VISIT 1 Visit  PT Treatments  $Therapeutic Exercise 8-22 mins

## 2018-07-13 NOTE — Progress Notes (Signed)
Subjective: 3 Days Post-Op Procedure(s) (LRB): TOTAL KNEE ARTHROPLASTY (Right) Patient reports pain as 9/10 to right knee.  Reports improvement with Dilaudid. Tolertaing PO's. Positive flatus. Limited PT. Denies CP or SOB.  Objective: Vital signs in last 24 hours: Temp:  [98 F (36.7 C)-98.7 F (37.1 C)] 98.4 F (36.9 C) (02/03 0439) Pulse Rate:  [66-79] 79 (02/03 0439) Resp:  [16] 16 (02/03 0439) BP: (108-117)/(70-74) 108/70 (02/03 0439) SpO2:  [95 %] 95 % (02/03 0439)  Intake/Output from previous day: 02/02 0701 - 02/03 0700 In: 600 [P.O.:600] Out: 2475 [Urine:2475] Intake/Output this shift: Total I/O In: 240 [P.O.:240] Out: 600 [Urine:600]  Recent Labs    07/11/18 0457 07/12/18 0426 07/13/18 0424  HGB 12.9* 11.4* 11.0*   Recent Labs    07/12/18 0426 07/13/18 0424  WBC 10.1 8.7  RBC 3.53* 3.33*  HCT 35.2* 34.0*  PLT 183 180   Recent Labs    07/11/18 0457  NA 135  K 4.1  CL 103  CO2 25  BUN 14  CREATININE 1.17  GLUCOSE 138*  CALCIUM 8.6*   No results for input(s): LABPT, INR in the last 72 hours.  Well nourished. Alert and oriented x3. RRR, Lungs clear, BS x4. Abdomen soft and non tender. Right Calf soft and non tender. Right knee dressing D/I. No DVT signs. Compartment soft. No signs of infection.  Right LE grossly neurovascular intact. Plantar and dorsi flexion intact. Pedal pulse 2+.    Assessment/Plan: 3 Days Post-Op Procedure(s) (LRB): TOTAL KNEE ARTHROPLASTY (Right) Up with PT Pain management Plan d/c tomorrow Change aquacell ICe and elevate right knee Plan D/c tomorrow   Lajean Manes 07/13/2018, 9:46 AM

## 2018-07-14 ENCOUNTER — Ambulatory Visit: Admit: 2018-07-14 | Payer: Medicare Other | Admitting: Orthopedic Surgery

## 2018-07-14 ENCOUNTER — Emergency Department (HOSPITAL_COMMUNITY): Payer: Medicare Other | Admitting: Anesthesiology

## 2018-07-14 ENCOUNTER — Observation Stay (HOSPITAL_COMMUNITY)
Admission: EM | Admit: 2018-07-14 | Discharge: 2018-07-15 | Disposition: A | Discharge status: Home / Self Care | Payer: Medicare Other | Attending: Orthopedic Surgery | Admitting: Orthopedic Surgery

## 2018-07-14 ENCOUNTER — Encounter (HOSPITAL_COMMUNITY)
Admission: EM | Disposition: A | Discharge status: Home / Self Care | Payer: Self-pay | Source: Home / Self Care | Attending: Emergency Medicine

## 2018-07-14 ENCOUNTER — Encounter (HOSPITAL_COMMUNITY): Payer: Self-pay | Admitting: Emergency Medicine

## 2018-07-14 ENCOUNTER — Other Ambulatory Visit: Payer: Self-pay

## 2018-07-14 DIAGNOSIS — F17290 Nicotine dependence, other tobacco product, uncomplicated: Secondary | ICD-10-CM | POA: Diagnosis not present

## 2018-07-14 DIAGNOSIS — T8131XA Disruption of external operation (surgical) wound, not elsewhere classified, initial encounter: Secondary | ICD-10-CM | POA: Diagnosis not present

## 2018-07-14 DIAGNOSIS — M25761 Osteophyte, right knee: Secondary | ICD-10-CM | POA: Diagnosis not present

## 2018-07-14 DIAGNOSIS — K219 Gastro-esophageal reflux disease without esophagitis: Secondary | ICD-10-CM | POA: Diagnosis not present

## 2018-07-14 DIAGNOSIS — T8130XA Disruption of wound, unspecified, initial encounter: Secondary | ICD-10-CM

## 2018-07-14 DIAGNOSIS — Z79899 Other long term (current) drug therapy: Secondary | ICD-10-CM | POA: Diagnosis not present

## 2018-07-14 DIAGNOSIS — G43909 Migraine, unspecified, not intractable, without status migrainosus: Secondary | ICD-10-CM | POA: Diagnosis not present

## 2018-07-14 DIAGNOSIS — E039 Hypothyroidism, unspecified: Secondary | ICD-10-CM | POA: Diagnosis not present

## 2018-07-14 DIAGNOSIS — G629 Polyneuropathy, unspecified: Secondary | ICD-10-CM | POA: Diagnosis not present

## 2018-07-14 DIAGNOSIS — S81001A Unspecified open wound, right knee, initial encounter: Secondary | ICD-10-CM | POA: Diagnosis present

## 2018-07-14 DIAGNOSIS — Z79890 Hormone replacement therapy (postmenopausal): Secondary | ICD-10-CM | POA: Diagnosis not present

## 2018-07-14 DIAGNOSIS — Z85820 Personal history of malignant melanoma of skin: Secondary | ICD-10-CM | POA: Diagnosis not present

## 2018-07-14 DIAGNOSIS — E785 Hyperlipidemia, unspecified: Secondary | ICD-10-CM | POA: Diagnosis not present

## 2018-07-14 DIAGNOSIS — M1711 Unilateral primary osteoarthritis, right knee: Secondary | ICD-10-CM | POA: Diagnosis not present

## 2018-07-14 HISTORY — PX: I & D EXTREMITY: SHX5045

## 2018-07-14 SURGERY — IRRIGATION AND DEBRIDEMENT EXTREMITY
Anesthesia: General | Site: Knee | Laterality: Right

## 2018-07-14 MED ORDER — PROPOFOL 10 MG/ML IV BOLUS
INTRAVENOUS | Status: DC | PRN
  Administered 2018-07-14: 30 mg via INTRAVENOUS
  Administered 2018-07-14: 200 mg via INTRAVENOUS

## 2018-07-14 MED ORDER — LIDOCAINE 2% (20 MG/ML) 5 ML SYRINGE
INTRAMUSCULAR | Status: DC | PRN
  Administered 2018-07-14: 80 mg via INTRAVENOUS

## 2018-07-14 MED ORDER — SODIUM CHLORIDE 0.9 % IR SOLN
Status: DC | PRN
  Administered 2018-07-14: 3000 mL

## 2018-07-14 MED ORDER — DEXAMETHASONE SODIUM PHOSPHATE 10 MG/ML IJ SOLN
INTRAMUSCULAR | Status: DC | PRN
  Administered 2018-07-14: 8 mg via INTRAVENOUS

## 2018-07-14 MED ORDER — FENTANYL CITRATE (PF) 100 MCG/2ML IJ SOLN
INTRAMUSCULAR | Status: DC | PRN
  Administered 2018-07-14: 100 ug via INTRAVENOUS
  Administered 2018-07-14: 50 ug via INTRAVENOUS

## 2018-07-14 MED ORDER — FENTANYL CITRATE (PF) 250 MCG/5ML IJ SOLN
INTRAMUSCULAR | Status: AC
  Filled 2018-07-14: qty 5

## 2018-07-14 MED ORDER — CEFAZOLIN SODIUM 1 G IJ SOLR
INTRAMUSCULAR | Status: AC
  Filled 2018-07-14: qty 40

## 2018-07-14 MED ORDER — OXYCODONE HCL 5 MG PO TABS
ORAL_TABLET | ORAL | Status: AC
  Filled 2018-07-14: qty 1

## 2018-07-14 MED ORDER — HYDROMORPHONE HCL 2 MG PO TABS: 2.0000 mg | ORAL_TABLET | ORAL | 0 refills | Status: AC | PRN

## 2018-07-14 MED ORDER — CEPHALEXIN 500 MG PO CAPS: 500.0000 mg | ORAL_CAPSULE | Freq: Four times a day (QID) | ORAL | 0 refills | Status: DC

## 2018-07-14 MED ORDER — FENTANYL CITRATE (PF) 100 MCG/2ML IJ SOLN
INTRAMUSCULAR | Status: AC
  Filled 2018-07-14: qty 2

## 2018-07-14 MED ORDER — CEFAZOLIN SODIUM-DEXTROSE 2-4 GM/100ML-% IV SOLN
2.0000 g | Freq: Once | INTRAVENOUS | Status: AC
  Administered 2018-07-14: 2 g via INTRAVENOUS
  Filled 2018-07-14: qty 100

## 2018-07-14 MED ORDER — ONDANSETRON HCL 4 MG/2ML IJ SOLN: 4.0000 mg | Freq: Four times a day (QID) | INTRAMUSCULAR | Status: DC | PRN

## 2018-07-14 MED ORDER — OXYCODONE HCL 5 MG PO TABS
5.0000 mg | ORAL_TABLET | Freq: Once | ORAL | Status: AC | PRN
  Administered 2018-07-14: 5 mg via ORAL

## 2018-07-14 MED ORDER — CEFAZOLIN SODIUM-DEXTROSE 2-3 GM-%(50ML) IV SOLR
INTRAVENOUS | Status: DC | PRN
  Administered 2018-07-14: 2 g via INTRAVENOUS

## 2018-07-14 MED ORDER — PROPOFOL 10 MG/ML IV BOLUS
INTRAVENOUS | Status: AC
  Filled 2018-07-14: qty 40

## 2018-07-14 MED ORDER — HYDROMORPHONE HCL 1 MG/ML IJ SOLN
1.0000 mg | Freq: Once | INTRAMUSCULAR | Status: AC
  Administered 2018-07-14: 1 mg via INTRAVENOUS
  Filled 2018-07-14: qty 1

## 2018-07-14 MED ORDER — ONDANSETRON HCL 4 MG/2ML IJ SOLN
INTRAMUSCULAR | Status: DC | PRN
  Administered 2018-07-14: 4 mg via INTRAVENOUS

## 2018-07-14 MED ORDER — FENTANYL CITRATE (PF) 100 MCG/2ML IJ SOLN
25.0000 ug | INTRAMUSCULAR | Status: DC | PRN
  Administered 2018-07-14 – 2018-07-15 (×3): 50 ug via INTRAVENOUS

## 2018-07-14 MED ORDER — LACTATED RINGERS IV SOLN
INTRAVENOUS | Status: DC | PRN
  Administered 2018-07-14: 23:00:00 via INTRAVENOUS

## 2018-07-14 MED ORDER — 0.9 % SODIUM CHLORIDE (POUR BTL) OPTIME
TOPICAL | Status: DC | PRN
  Administered 2018-07-14: 1000 mL

## 2018-07-14 MED ORDER — PROPOFOL 10 MG/ML IV BOLUS
INTRAVENOUS | Status: AC
  Filled 2018-07-14: qty 20

## 2018-07-14 MED ORDER — ONDANSETRON 4 MG PO TBDP
4.0000 mg | ORAL_TABLET | Freq: Once | ORAL | Status: AC
  Administered 2018-07-14: 4 mg via ORAL
  Filled 2018-07-14: qty 1

## 2018-07-14 MED ORDER — OXYCODONE HCL 5 MG/5ML PO SOLN: 5.0000 mg | Freq: Once | ORAL | Status: AC | PRN

## 2018-07-14 SURGICAL SUPPLY — 73 items
ALCOHOL 70% 16 OZ (MISCELLANEOUS) ×3 IMPLANT
BANDAGE ACE 3X5.8 VEL STRL LF (GAUZE/BANDAGES/DRESSINGS) IMPLANT
BLADE SURG 10 STRL SS (BLADE) ×3 IMPLANT
BNDG CMPR MED 15X6 ELC VLCR LF (GAUZE/BANDAGES/DRESSINGS) ×1
BNDG COHESIVE 1X5 TAN STRL LF (GAUZE/BANDAGES/DRESSINGS) IMPLANT
BNDG COHESIVE 4X5 TAN STRL (GAUZE/BANDAGES/DRESSINGS) ×3 IMPLANT
BNDG COHESIVE 6X5 TAN STRL LF (GAUZE/BANDAGES/DRESSINGS) ×6 IMPLANT
BNDG CONFORM 3 STRL LF (GAUZE/BANDAGES/DRESSINGS) IMPLANT
BNDG ELASTIC 6X15 VLCR STRL LF (GAUZE/BANDAGES/DRESSINGS) ×2 IMPLANT
BNDG GAUZE ELAST 4 BULKY (GAUZE/BANDAGES/DRESSINGS) ×2 IMPLANT
BNDG GAUZE STRTCH 6 (GAUZE/BANDAGES/DRESSINGS) ×9 IMPLANT
CORDS BIPOLAR (ELECTRODE) IMPLANT
COVER SURGICAL LIGHT HANDLE (MISCELLANEOUS) ×3 IMPLANT
COVER WAND RF STERILE (DRAPES) ×3 IMPLANT
CUFF TOURNIQUET SINGLE 24IN (TOURNIQUET CUFF) IMPLANT
CUFF TOURNIQUET SINGLE 34IN LL (TOURNIQUET CUFF) ×6 IMPLANT
CUFF TOURNIQUET SINGLE 44IN (TOURNIQUET CUFF) IMPLANT
DRAPE EXTREMITY BILATERAL (DRAPES) IMPLANT
DRAPE IMP U-DRAPE 54X76 (DRAPES) IMPLANT
DRAPE INCISE IOBAN 66X45 STRL (DRAPES) ×12 IMPLANT
DRAPE SURG 17X23 STRL (DRAPES) IMPLANT
DRAPE U-SHAPE 47X51 STRL (DRAPES) ×3 IMPLANT
DRSG ADAPTIC 3X8 NADH LF (GAUZE/BANDAGES/DRESSINGS) ×2 IMPLANT
DRSG PAD ABDOMINAL 8X10 ST (GAUZE/BANDAGES/DRESSINGS) ×2 IMPLANT
DURAPREP 26ML APPLICATOR (WOUND CARE) ×3 IMPLANT
ELECT CAUTERY BLADE 6.4 (BLADE) ×3 IMPLANT
ELECT REM PT RETURN 9FT ADLT (ELECTROSURGICAL)
ELECTRODE REM PT RTRN 9FT ADLT (ELECTROSURGICAL) IMPLANT
FACESHIELD WRAPAROUND (MASK) IMPLANT
FACESHIELD WRAPAROUND OR TEAM (MASK) IMPLANT
GAUZE SPONGE 4X4 12PLY STRL (GAUZE/BANDAGES/DRESSINGS) ×4 IMPLANT
GAUZE XEROFORM 1X8 LF (GAUZE/BANDAGES/DRESSINGS) ×3 IMPLANT
GAUZE XEROFORM 5X9 LF (GAUZE/BANDAGES/DRESSINGS) ×3 IMPLANT
GLOVE BIO SURGEON STRL SZ7.5 (GLOVE) ×3 IMPLANT
GLOVE BIOGEL PI IND STRL 8 (GLOVE) ×1 IMPLANT
GLOVE BIOGEL PI INDICATOR 8 (GLOVE) ×2
GOWN STRL REUS W/ TWL LRG LVL3 (GOWN DISPOSABLE) ×2 IMPLANT
GOWN STRL REUS W/ TWL XL LVL3 (GOWN DISPOSABLE) ×1 IMPLANT
GOWN STRL REUS W/TWL LRG LVL3 (GOWN DISPOSABLE) ×6
GOWN STRL REUS W/TWL XL LVL3 (GOWN DISPOSABLE) ×3
HANDPIECE INTERPULSE COAX TIP (DISPOSABLE)
IMMOBILIZER KNEE 22 (SOFTGOODS) ×2 IMPLANT
KIT BASIN OR (CUSTOM PROCEDURE TRAY) ×3 IMPLANT
KIT TURNOVER KIT B (KITS) ×3 IMPLANT
MANIFOLD NEPTUNE II (INSTRUMENTS) ×3 IMPLANT
NS IRRIG 1000ML POUR BTL (IV SOLUTION) ×6 IMPLANT
PACK ORTHO EXTREMITY (CUSTOM PROCEDURE TRAY) ×3 IMPLANT
PAD ABD 8X10 STRL (GAUZE/BANDAGES/DRESSINGS) ×3 IMPLANT
PAD ARMBOARD 7.5X6 YLW CONV (MISCELLANEOUS) ×6 IMPLANT
PADDING CAST ABS 4INX4YD NS (CAST SUPPLIES) ×4
PADDING CAST ABS COTTON 4X4 ST (CAST SUPPLIES) ×2 IMPLANT
PADDING CAST COTTON 6X4 STRL (CAST SUPPLIES) ×3 IMPLANT
SET HNDPC FAN SPRY TIP SCT (DISPOSABLE) IMPLANT
SPONGE LAP 18X18 X RAY DECT (DISPOSABLE) ×6 IMPLANT
STAPLER VISISTAT 35W (STAPLE) ×2 IMPLANT
STOCKINETTE IMPERVIOUS 9X36 MD (GAUZE/BANDAGES/DRESSINGS) ×3 IMPLANT
SUT ETHILON 2 0 FS 18 (SUTURE) IMPLANT
SUT ETHILON 2 0 PSLX (SUTURE) IMPLANT
SUT ETHILON 3 0 PS 1 (SUTURE) IMPLANT
SUT MON AB 2-0 CT1 36 (SUTURE) ×4 IMPLANT
SUT VIC AB 2-0 CT1 36 (SUTURE) IMPLANT
SUT VIC AB 2-0 FS1 27 (SUTURE) IMPLANT
SWAB CULTURE ESWAB REG 1ML (MISCELLANEOUS) IMPLANT
SYR CONTROL 10ML LL (SYRINGE) IMPLANT
TOWEL OR 17X24 6PK STRL BLUE (TOWEL DISPOSABLE) ×3 IMPLANT
TOWEL OR 17X26 10 PK STRL BLUE (TOWEL DISPOSABLE) ×3 IMPLANT
TUBE CONNECTING 12'X1/4 (SUCTIONS) ×1
TUBE CONNECTING 12X1/4 (SUCTIONS) ×2 IMPLANT
TUBE FEEDING ENTERAL 5FR 16IN (TUBING) IMPLANT
TUBING CYSTO DISP (UROLOGICAL SUPPLIES) ×3 IMPLANT
UNDERPAD 30X30 (UNDERPADS AND DIAPERS) ×6 IMPLANT
WATER STERILE IRR 1000ML POUR (IV SOLUTION) ×3 IMPLANT
YANKAUER SUCT BULB TIP NO VENT (SUCTIONS) ×3 IMPLANT

## 2018-07-14 NOTE — ED Provider Notes (Signed)
[facility] EMERGENCY DEPARTMENT Provider Note   CSN: [id number] Arrival date & time: 07/14/18  1620     History   Chief Complaint Chief Complaint  Patient presents with  . Post-op Problem    HPI Frank Weber is a 68 y.o. male.  Patient just discharged from [facility] at about 3 PM status post right knee replacement surgery by Dr. Theda Sers.  Patient was going into the pharmacy to get his prescriptions he with his occlusive dressing started to bleed extensively.  Patient states that he had some bleeding issues from the room twice postoperatively that they closed with Steri-Strips.  Patient states that does not feel as if he was got a pass out does not get lost that much blood no other complaints.     Past Medical History:  Diagnosis Date  . Arthritis    right knee  . Constipation   . Frequency of urination   . GERD (gastroesophageal reflux disease)   . Heart murmur    "slight"   . History of kidney stones   . History of melanoma excision    2012--  NECK/ HEAD  . History of squamous cell carcinoma excision    RIGHT EAR  . Hyperlipidemia   . Hypothyroidism   . IBS (irritable bowel syndrome)    states slight  . Lumbar stenosis    L5 - S1  . Migraines   . Peripheral neuropathy    legs and feet  . Right flank pain   . Right ureteral stone   . Urgency of urination   . Wears glasses     Patient Active Problem List   Diagnosis Date Noted  . S/P knee replacement 07/10/2018  . Right ureteral stone 06/18/2015  . Unspecified hereditary and idiopathic peripheral neuropathy 01/27/2013  . IBS (irritable bowel syndrome) 01/27/2013  . GERD (gastroesophageal reflux disease) 01/27/2013  . History of migraine headaches 01/27/2013    Past Surgical History:  Procedure Laterality Date  . CARDIAC CATHETERIZATION  07-08-2007  dr Tami Ribas    no significant CAD, preserved LVF, ef 50%//  30% pLAD  . CARDIOVASCULAR STRESS TEST  06-20-2011  dr croitoru   Low risk study/   normal perfusion scan showing attenuation artifact in the anterior region of myocardium with no ischemia , infarct or scar/  normal LV funciton and wall motion , ef 62%  . COLONOSCOPY N/A 02/17/2013   Procedure: COLONOSCOPY;  Surgeon: Rogene Houston, MD;  Location: [facility];  Service: Endoscopy;  Laterality: N/A;  225  . CYSTOSCOPY W/ URETERAL STENT PLACEMENT Right 06/18/2015   Procedure: CYSTOSCOPY WITH RIGHT RETROGRADE PYELOGRAM RIGHT URETERAL STENT PLACEMENT;  Surgeon: [name], MD;  Location: [facility];  Service: Urology;  Laterality: Right;  . CYSTOSCOPY/URETEROSCOPY/HOLMIUM LASER/STENT PLACEMENT Right 06/27/2015   Procedure: CYSTOSCOPY RIGHT URETEROSCOPY  STENT REMOVAL; STONE EXTRACTION WITH BASKET;  Surgeon: [name], MD;  Location: [facility];  Service: Urology;  Laterality: Right;  . HEAD & NECK SKIN LESION EXCISIONAL BIOPSY    . HOLMIUM LASER APPLICATION Right 7/90/2409   Procedure: HOLMIUM LASER LITHOTRIPSY ;  Surgeon: [name], MD;  Location: [facility];  Service: Urology;  Laterality: Right;  . INGUINAL HERNIA REPAIR Left 10/21/2007  . KNEE ARTHROSCOPY W/ ACL RECONSTRUCTION Right 1992  . MENISCUS REPAIR Right    2019, dr Theda Sers   . SHOULDER ARTHROSCOPY WITH OPEN ROTATOR CUFF REPAIR Left 07/27/2014   Procedure: LEFT SHOULDER ARTHROSCOPY WITH MINI OPEN ROTATOR  CUFF REPAIR;  Surgeon: [name], MD;  Location: [place];  Service: Orthopedics;  Laterality: Left;  . SHOULDER ARTHROSCOPY WITH OPEN ROTATOR CUFF REPAIR Right 2006  . TONSILLECTOMY AND ADENOIDECTOMY  age 82  . TOTAL KNEE ARTHROPLASTY Right 07/10/2018   Procedure: TOTAL KNEE ARTHROPLASTY;  Surgeon: [name], MD;  Location: [facility];  Service: Orthopedics;  Laterality: Right;  . TRANSTHORACIC ECHOCARDIOGRAM  07-30-2007   normal LVF, ef >55%/  mild AR, MR , PR and TR/  mild AV sclerosis without stenosis/   . URETEROSCOPY Right 06/18/2015   Procedure: URETEROSCOPY;   Surgeon: [name], MD;  Location: [facility];  Service: Urology;  Laterality: Right;        Home Medications    Prior to Admission medications   Medication Sig Start Date End Date Taking? Authorizing Provider  aspirin EC 325 MG tablet Take 1 tablet (325 mg total) by mouth 2 (two) times daily. 07/10/18 07/10/19  Stilwell, Bryson L, PA-C  DULoxetine (CYMBALTA) 30 MG capsule Take 30 mg by mouth at bedtime.      [provider]  gabapentin (NEURONTIN) 300 MG capsule Take 900 mg by mouth 4 (four) times daily.     [provider]  HYDROmorphone (DILAUDID) 2 MG tablet Take 1-2 tablets (2-4 mg total) by mouth every 4 (four) hours as needed for up to 7 days for moderate pain or severe pain (1 pill scale 1-5; 2 pills scale 6-10). 07/14/18 07/21/18  Stilwell, Sueanne Margarita, PA-C  levothyroxine (SYNTHROID, LEVOTHROID) 25 MCG tablet Take 25 mcg by mouth daily before breakfast.    [provider]  LORazepam (ATIVAN) 0.5 MG tablet Take 1-1.5 mg by mouth at bedtime as needed for anxiety or sleep.     [provider]  methocarbamol (ROBAXIN) 500 MG tablet Take 1 tablet (500 mg total) by mouth 4 (four) times daily. 07/10/18   Stilwell, Bryson L, PA-C  oxyCODONE (ROXICODONE) 5 MG immediate release tablet Take 1 tablet (5 mg total) by mouth every 4 (four) hours as needed for up to 7 days. 07/10/18 07/17/18  Stilwell, Sueanne Margarita, PA-C  Polyethyl Glycol-Propyl Glycol (SYSTANE ULTRA) 0.4-0.3 % SOLN Place 2 drops into both eyes daily as needed (for dry eyes).    [provider]  pravastatin (PRAVACHOL) 40 MG tablet Take 40 mg by mouth at bedtime.     [provider]  Probiotic Product (TRUBIOTICS PO) Take 1 capsule by mouth at bedtime.    [provider]  rizatriptan (MAXALT-MLT) 10 MG disintegrating tablet Take 10 mg by mouth as needed for migraine. May repeat in 2 hours if needed MIGRAINE     [provider]    Family History Family History  Problem Relation  Age of Onset  . Colon cancer Other        Age of Onset-late 71's    Social History Social History   Tobacco Use  . Smoking status: Current Some Day Smoker    Packs/day: 0.00    Years: 30.00    Pack years: 0.00    Types: Cigars  . Smokeless tobacco: Never Used  . Tobacco comment: smokes an occasional cigar  Substance Use Topics  . Alcohol use: Yes    Comment: seldom   . Drug use: No     Allergies   Sulfa antibiotics and Coconut oil   Review of Systems Review of Systems  Constitutional: Negative for chills and fever.  HENT: Negative for congestion, rhinorrhea and  sore throat.   Eyes: Negative for visual disturbance.  Respiratory: Negative for cough and shortness of breath.   Cardiovascular: Negative for chest pain and leg swelling.  Gastrointestinal: Negative for abdominal pain, diarrhea, nausea and vomiting.  Genitourinary: Negative for dysuria.  Musculoskeletal: Negative for back pain and neck pain.  Skin: Negative for rash.  Neurological: Negative for dizziness, light-headedness and headaches.  Hematological: Does not bruise/bleed easily.  Psychiatric/Behavioral: Negative for confusion.     Physical Exam Updated Vital Signs BP 105/69 (BP Location: Left Arm)   Pulse 69   Temp 98.2 F (36.8 C) (Oral)   Resp 16   SpO2 99%   Physical Exam Vitals signs and nursing note reviewed.  Constitutional:      Appearance: He is well-developed.  HENT:     Head: Normocephalic and atraumatic.  Eyes:     Extraocular Movements: Extraocular movements intact.     Conjunctiva/sclera: Conjunctivae normal.     Pupils: Pupils are equal, round, and reactive to light.  Neck:     Musculoskeletal: Neck supple.  Cardiovascular:     Rate and Rhythm: Normal rate and regular rhythm.     Heart sounds: No murmur.  Pulmonary:     Effort: Pulmonary effort is normal. No respiratory distress.     Breath sounds: Normal breath sounds.  Abdominal:     Palpations: Abdomen is soft.      Tenderness: There is no abdominal tenderness.  Musculoskeletal: Normal range of motion.     Comments: Occlusive dressing removed from from the right knee where he had total knee replacement.  There is wound dehiscence in the inferior aspect measuring 4 cm in length and 2 cm in width.  With a large clot in it.  The clot was not cleaned out.  Sterile dressing was placed over it.  The proximal part of the wound is still intact with multiple Steri-Strips in place.  No evidence of any infection.  No purulent discharge.  Skin:    General: Skin is warm and dry.     Capillary Refill: Capillary refill takes less than 2 seconds.  Neurological:     General: No focal deficit present.     Mental Status: He is alert and oriented to person, place, and time.      ED Treatments / Results  Labs (all labs ordered are listed, but only abnormal results are displayed) Labs Reviewed - No data to display  EKG None  Radiology Dg Knee Right Port  Result Date: 07/13/2018 CLINICAL DATA:  Status post knee replacement 07/10/2018.  Pain. EXAM: PORTABLE RIGHT KNEE - 1-2 VIEW COMPARISON:  None. FINDINGS: Right knee arthroplasty is in place. No hardware complication is seen. Soft tissues about the anterior aspect of the knee are swollen most consistent with postoperative change. No fracture or other focal bony abnormality. IMPRESSION: Anterior soft tissue swelling about the knee is presumably related to surgery. No acute abnormality with a knee arthroplasty in place. Electronically Signed   By: Inge Rise M.D.   On: 07/13/2018 16:15   Dg Femur Port, Min 2 Views Right  Result Date: 07/13/2018 CLINICAL DATA:  Status post right knee replacement 07/10/2018. Right upper leg pain. Initial encounter. EXAM: RIGHT FEMUR PORTABLE 2 VIEW COMPARISON:  None. FINDINGS: There is no evidence of fracture or other focal bone lesions. Soft tissues swelling about the knee and a small amount of gas in the soft tissues are most consistent  with postoperative change. IMPRESSION: No acute abnormality. Electronically Signed  By: Inge Rise M.D.   On: 07/13/2018 16:16    Procedures Procedures (including critical care time)  Medications Ordered in ED Medications  ondansetron (ZOFRAN-ODT) disintegrating tablet 4 mg (has no administration in time range)  HYDROmorphone (DILAUDID) injection 1 mg (has no administration in time range)     Initial Impression / Assessment and Plan / ED Course  I have reviewed the triage vital signs and the nursing notes.  Pertinent labs & imaging results that were available during my care of the patient were reviewed by me and considered in my medical decision making (see chart for details).     Discussed with Dr. Stann Mainland on-call for [facility].  He is recommending patient be transferred to the emergency department at [facility] they are planning to take the patient back to the operating room and wash this wound down and close it up again.  Patient informed of this.  Patient given some IV pain medicine as well as some antinausea medicine.  Patient understands he needs to be n.p.o.  Patient will be transferred to [facility] by [facility].  He is going to the [facility] emergency department.  Discussed with Dr. Lita Mains in the emergency department.  And Dr. Stann Mainland from orthopedics accepting.  Final Clinical Impressions(s) / ED Diagnoses   Final diagnoses:  None    ED Discharge Orders    None       Fredia Sorrow, MD 07/14/18 1911

## 2018-07-14 NOTE — Discharge Summary (Signed)
Physician Discharge Summary  Patient ID: Frank Weber MRN: [id number] DOB/AGE: 1950/10/29 68 y.o.  Admit date: 07/10/2018 Discharge date: 07/14/2018  Admission Diagnoses: Right knee OA  Discharge Diagnoses:  Active Problems:   S/P knee replacement   Discharged Condition: good  Hospital Course:  Frank Weber is a 68 y.o. who was admitted to [facility]. They were brought to the operating room on 07/10/2018 and underwent Procedure(s): TOTAL KNEE ARTHROPLASTY.  Patient tolerated the procedure well and was later transferred to the recovery room and then to the orthopaedic floor for postoperative care.  They were given PO and IV analgesics for pain control following their surgery.  They were given 24 hours of postoperative antibiotics of  Anti-infectives (From admission, onward)   Start     Dose/Rate Route Frequency Ordered Stop   07/10/18 1700  ceFAZolin (ANCEF) IVPB 2g/100 mL premix     2 g 200 mL/hr over 30 Minutes Intravenous Every 6 hours 07/10/18 1312 07/10/18 2358   07/10/18 0800  ceFAZolin (ANCEF) IVPB 2g/100 mL premix     2 g 200 mL/hr over 30 Minutes Intravenous On call to O.R. 07/10/18 8416 07/10/18 1058     and started on DVT prophylaxis in the form of lovenox.   PT and OT were ordered for total joint protocol.  Discharge planning consulted to help with postop disposition and equipment needs.  Patient had a fair night on the evening of surgery and started to get up OOB with therapy on day one.  Hemovac drain was pulled without difficulty.  Continued to work with therapy into day two.  Dressing was with normal limits.  The patient had progressed with therapy and meeting their goals. Patient was seen in rounds and was ready to go home.  Consults: n/a  Significant Diagnostic Studies: routine  Treatments: routine  Discharge Exam: Blood pressure 113/73, pulse 69, temperature 98.5 F (36.9 C), temperature source Oral, resp. rate 16, height 6\' 3"  (1.905 m), weight 98  kg, SpO2 95 %. Well nourished. Alert and oriented x3. RRR, Lungs clear, BS x4. Abdomen soft and non tender. Right Calf soft and non tender. Right knee dressing C/D/I. No DVT signs. Compartment soft. No signs of infection.  Right LE grossly neurovascular intact.  Disposition:   Discharge Instructions    Call MD / Call 911   Complete by:  As directed    If you experience chest pain or shortness of breath, CALL 911 and be transported to the hospital emergency room.  If you develope a fever above 101 F, pus (white drainage) or increased drainage or redness at the wound, or calf pain, call your surgeon's office.   Constipation Prevention   Complete by:  As directed    Drink plenty of fluids.  Prune juice may be helpful.  You may use a stool softener, such as Colace (over the counter) 100 mg twice a day.  Use MiraLax (over the counter) for constipation as needed.   Diet - low sodium heart healthy   Complete by:  As directed    Discharge instructions   Complete by:  As directed    INSTRUCTIONS AFTER JOINT REPLACEMENT   Remove items at home which could result in a fall. This includes throw rugs or furniture in walking pathways ICE to the affected joint every three hours while awake for 30 minutes at a time, for at least the first 3-5 days, and then as needed for pain and swelling.  Continue to use  ice for pain and swelling. You may notice swelling that will progress down to the foot and ankle.  This is normal after surgery.  Elevate your leg when you are not up walking on it.   Continue to use the breathing machine you got in the hospital (incentive spirometer) which will help keep your temperature down.  It is common for your temperature to cycle up and down following surgery, especially at night when you are not up moving around and exerting yourself.  The breathing machine keeps your lungs expanded and your temperature down.   DIET:  As you were doing prior to hospitalization, we recommend a  well-balanced diet.  DRESSING / WOUND CARE / SHOWERING  Keep the surgical dressing until follow up.  The dressing is water proof, so you can shower without any extra covering.  IF THE DRESSING FALLS OFF or the wound gets wet inside, change the dressing with sterile gauze.  Please use good hand washing techniques before changing the dressing.  Do not use any lotions or creams on the incision until instructed by your surgeon.    ACTIVITY  Increase activity slowly as tolerated, but follow the weight bearing instructions below.   No driving for 6 weeks or until further direction given by your physician.  You cannot drive while taking narcotics.  No lifting or carrying greater than 10 lbs. until further directed by your surgeon. Avoid periods of inactivity such as sitting longer than an hour when not asleep. This helps prevent blood clots.  You may return to work once you are authorized by your doctor.     WEIGHT BEARING   Weight bearing as tolerated with assist device (walker, cane, etc) as directed, use it as long as suggested by your surgeon or therapist, typically at least 4-6 weeks.   EXERCISES  Results after joint replacement surgery are often greatly improved when you follow the exercise, range of motion and muscle strengthening exercises prescribed by your doctor. Safety measures are also important to protect the joint from further injury. Any time any of these exercises cause you to have increased pain or swelling, decrease what you are doing until you are comfortable again and then slowly increase them. If you have problems or questions, call your caregiver or physical therapist for advice.   Rehabilitation is important following a joint replacement. After just a few days of immobilization, the muscles of the leg can become weakened and shrink (atrophy).  These exercises are designed to build up the tone and strength of the thigh and leg muscles and to improve motion. Often times heat  used for twenty to thirty minutes before working out will loosen up your tissues and help with improving the range of motion but do not use heat for the first two weeks following surgery (sometimes heat can increase post-operative swelling).   These exercises can be done on a training (exercise) mat, on the floor, on a table or on a bed. Use whatever works the best and is most comfortable for you.    Use music or television while you are exercising so that the exercises are a pleasant break in your day. This will make your life better with the exercises acting as a break in your routine that you can look forward to.   Perform all exercises about fifteen times, three times per day or as directed.  You should exercise both the operative leg and the other leg as well.   Exercises include:   [employer]  Sets - Tighten up the muscle on the front of the thigh (Quad) and hold for 5-10 seconds.   Straight Leg Raises - With your knee straight (if you were given a brace, keep it on), lift the leg to 60 degrees, hold for 3 seconds, and slowly lower the leg.  Perform this exercise against resistance later as your leg gets stronger.  Leg Slides: Lying on your back, slowly slide your foot toward your buttocks, bending your knee up off the floor (only go as far as is comfortable). Then slowly slide your foot back down until your leg is flat on the floor again.  Angel Wings: Lying on your back spread your legs to the side as far apart as you can without causing discomfort.  Hamstring Strength:  Lying on your back, push your heel against the floor with your leg straight by tightening up the muscles of your buttocks.  Repeat, but this time bend your knee to a comfortable angle, and push your heel against the floor.  You may put a pillow under the heel to make it more comfortable if necessary.   A rehabilitation program following joint replacement surgery can speed recovery and prevent re-injury in the future due to weakened  muscles. Contact your doctor or a physical therapist for more information on knee rehabilitation.    CONSTIPATION  Constipation is defined medically as fewer than three stools per week and severe constipation as less than one stool per week.  Even if you have a regular bowel pattern at home, your normal regimen is likely to be disrupted due to multiple reasons following surgery.  Combination of anesthesia, postoperative narcotics, change in appetite and fluid intake all can affect your bowels.   YOU MUST use at least one of the following options; they are listed in order of increasing strength to get the job done.  They are all available over the counter, and you may need to use some, POSSIBLY even all of these options:    Drink plenty of fluids (prune juice may be helpful) and high fiber foods Colace 100 mg by mouth twice a day  Senokot for constipation as directed and as needed Dulcolax (bisacodyl), take with full glass of water  Miralax (polyethylene glycol) once or twice a day as needed.  If you have tried all these things and are unable to have a bowel movement in the first 3-4 days after surgery call either your surgeon or your primary doctor.    If you experience loose stools or diarrhea, hold the medications until you stool forms back up.  If your symptoms do not get better within 1 week or if they get worse, check with your doctor.  If you experience "the worst abdominal pain ever" or develop nausea or vomiting, please contact the office immediately for further recommendations for treatment.   ITCHING:  If you experience itching with your medications, try taking only a single pain pill, or even half a pain pill at a time.  You can also use Benadryl over the counter for itching or also to help with sleep.   TED HOSE STOCKINGS:  Use stockings on both legs until for at least 2 weeks or as directed by physician office. They may be removed at night for sleeping.  MEDICATIONS:  See your  medication summary on the "After Visit Summary" that nursing will review with you.  You may have some home medications which will be placed on hold until you complete the course of  blood thinner medication.  It is important for you to complete the blood thinner medication as prescribed.  PRECAUTIONS:  If you experience chest pain or shortness of breath - call 911 immediately for transfer to the hospital emergency department.   If you develop a fever greater that 101 F, purulent drainage from wound, increased redness or drainage from wound, foul odor from the wound/dressing, or calf pain - CONTACT YOUR SURGEON.                                                   FOLLOW-UP APPOINTMENTS:  If you do not already have a post-op appointment, please call the office for an appointment to be seen by your surgeon.  Guidelines for how soon to be seen are listed in your "After Visit Summary", but are typically between 1-4 weeks after surgery.  OTHER INSTRUCTIONS:   Knee Replacement:  Do not place pillow under knee, focus on keeping the knee straight while resting. CPM instructions: 0-90 degrees, 2 hours in the morning, 2 hours in the afternoon, and 2 hours in the evening. Place foam block, curve side up under heel at all times except when in CPM or when walking.  DO NOT modify, tear, cut, or change the foam block in any way.  MAKE SURE YOU:  Understand these instructions.  Get help right away if you are not doing well or get worse.    Thank you for letting us be a part of your medical care team.  It is a privilege we respect greatly.  We hope these instructions will help you stay on track for a fast and full recovery!   Increase activity slowly as tolerated   Complete by:  As directed      Follow-up Information    [facility] Follow up.   Specialty:  [place] Why:  [place] will call to arrange visit Contact information: [address] [place] [address] [phone]           Signed: Lajean Manes 07/14/2018, 8:25 AM

## 2018-07-14 NOTE — ED Triage Notes (Signed)
Patient states he had right knee replacement on Friday and was discharged from [facility] today. Bleeding noted from surgical site at triage.

## 2018-07-14 NOTE — Op Note (Signed)
Date of Surgery: 07/14/2018  INDICATIONS: [name] is a 68 y.o.-year-old male with a right wound dehiscence following total knee arthroplasty just 4 days ago with my partner Dr. Theda Sers.  He was discharged from the hospital earlier today when he noted some bleeding from his bandage at the pharmacy.  This was somewhat significant for his recollection and he presented to the [facility] emergency department.  There he was noted to have wound dehiscence distally.  He was transferred back to [facility] for me to assume care.  We recommended a urgent irrigation and debridement with closure.;  The patient did consent to the procedure after discussion of the risks and benefits.  PREOPERATIVE DIAGNOSIS:  1.  Right total knee postoperative wound dehiscence, superficial.  POSTOPERATIVE DIAGNOSIS: Same.  PROCEDURE:  1.  Irrigation and debridement of postoperative wound dehiscence 2.  Layered closure of open wound, complex.  SURGEON: Geralynn Rile, M.D.  ASSIST: None.  ANESTHESIA:  general  IV FLUIDS AND URINE: See anesthesia.  ESTIMATED BLOOD LOSS: 10 mL.  IMPLANTS: None  DRAINS: None  COMPLICATIONS: None.  DESCRIPTION OF PROCEDURE: The patient was brought to the operating room and placed supine on the operating table.  The patient had been signed prior to the procedure and this was documented. The patient had the anesthesia placed by the anesthesiologist.  A time-out was performed to confirm that this was the correct patient, site, side and location. The patient did receive antibiotics prior to the incision and was re-dosed during the procedure as needed at indicated intervals.  A tourniquet was not placed.  The patient had the operative extremity prepped and draped in the standard surgical fashion.      The wound dehiscence was measured at 4 cm x 2 cm along the distal aspect of the incision.  There was organized hematoma within that.  This was bluntly decompressed.  I did open the distal half  of the incision up to the level of the superior pole of the patella.  I then with direct palpation inspected the medial parapatellar arthrotomy.  I range the knee from 0 to 90 degrees and the arthrotomy was in fact intact with no signs of dehiscence or exposure to the joint.  I then sharply excised the skin and subcutaneous tissue with knife to freshen up the skin incision.  I then bluntly debrided the postoperative wound hematoma.  We then copiously irrigated the wound with 3 L of normal saline.  Next we moved to close the wound.  We used a layered closure with 2-0 Monocryl for the deep dermal layer including the proximal aspect of the wound as well as the distal newly opened aspect of the wound.  We then utilized staples to reapproximate the dermis and epidermis.   The leg was cleaned and dried one final time.  Standard sterile bandage was applied including Adaptic for the incision, 4 x 4 sterile dressing sponges, and ABD pad as well as Kerlix and pressure dressing with Ace wrap from toes to thigh.  The leg was then placed in a knee immobilizer to maintain extension.  POSTOPERATIVE PLAN:  The patient will be weightbearing as tolerated to the right lower extremity but will maintain the knee immobilizer for 1 week.  He will be placed on 1 week of oral Keflex.  He will maintain a clean dry dressing to the leg to be changed once daily starting in 3 days.  He will not get the wound wet.  He will return  to see Dr. Theda Sers in 1 week for wound check.  We will resume his aspirin for DVT prophylaxis.

## 2018-07-14 NOTE — ED Notes (Addendum)
[facility] notified to transport patient

## 2018-07-14 NOTE — ED Notes (Signed)
Called [facility] for transport to [facility]  ER as requested by EDP.  Spoke with [employer].  Dr. Lita Mains accepting to ER.

## 2018-07-14 NOTE — Progress Notes (Signed)
Subjective: 4 Days Post-Op Procedure(s) (LRB): TOTAL KNEE ARTHROPLASTY (Right) Patient reports pain as 5/10 and improving.  Progress with PT. Toelrating PO's. Denies CP or SOB.  Objective: Vital signs in last 24 hours: Temp:  [98.1 F (36.7 C)-98.5 F (36.9 C)] 98.5 F (36.9 C) (02/04 0516) Pulse Rate:  [69-77] 69 (02/04 0516) Resp:  [16] 16 (02/03 2134) BP: (107-121)/(67-73) 113/73 (02/04 0516) SpO2:  [93 %-95 %] 95 % (02/04 0516)  Intake/Output from previous day: 02/03 0701 - 02/04 0700 In: 1040 [P.O.:1040] Out: 1950 [Urine:1950] Intake/Output this shift: No intake/output data recorded.  Recent Labs    07/12/18 0426 07/13/18 0424  HGB 11.4* 11.0*   Recent Labs    07/12/18 0426 07/13/18 0424  WBC 10.1 8.7  RBC 3.53* 3.33*  HCT 35.2* 34.0*  PLT 183 180   No results for input(s): NA, K, CL, CO2, BUN, CREATININE, GLUCOSE, CALCIUM in the last 72 hours. No results for input(s): LABPT, INR in the last 72 hours.  Well nourished. Alert and oriented x3. RRR, Lungs clear, BS x4. Abdomen soft and non tender. Right Calf soft and non tender. Right knee dressing C/D/I. No DVT signs. Compartment soft. No signs of infection.  Right LE grossly neurovascular intact.    Assessment/Plan: 4 Days Post-Op Procedure(s) (LRB): TOTAL KNEE ARTHROPLASTY (Right) Up with PT Dressing changed D/c home ASA BID F/u in 2 weeks    Silvestre Mines L Alfonza Toft 07/14/2018, 8:23 AM

## 2018-07-14 NOTE — ED Notes (Signed)
Pt here from [facility]. EMS states he was at the pharmacy when he noticed a pool of blood at his feet from his incision on his R leg opening. Pt A&Ox4 incision has dressing placed over it. Dr. Stann Mainland from ortho to be accepting.

## 2018-07-14 NOTE — Brief Op Note (Signed)
07/14/2018  11:28 PM  PATIENT:  Frank Weber  68 y.o. male  PRE-OPERATIVE DIAGNOSIS:  dehiscence of right knee, Status post Right Total Knee Replacement 07/10/2018  POST-OPERATIVE DIAGNOSIS:  dehiscence of right knee, Status post Right Total Knee Replacement 07/10/2018  PROCEDURE:  Procedure(s): IRRIGATION AND DEBRIDEMENT RIGHT KNEE WITH CLOSURE (Right)  SURGEON:  Surgeon(s) and Role:    * Nicholes Stairs, MD - Primary  PHYSICIAN ASSISTANT:   ASSISTANTS: none   ANESTHESIA:   general  EBL:  10 cc  BLOOD ADMINISTERED:none  DRAINS: none   LOCAL MEDICATIONS USED:  NONE  SPECIMEN:  No Specimen  DISPOSITION OF SPECIMEN:  N/A  COUNTS:  YES  TOURNIQUET:  * No tourniquets in log *  DICTATION: .Note written in EPIC  PLAN OF CARE: Discharge to home after PACU  PATIENT DISPOSITION:  PACU - hemodynamically stable.   Delay start of Pharmacological VTE agent (>24hrs) due to surgical blood loss or risk of bleeding: not applicable

## 2018-07-14 NOTE — ED Notes (Signed)
[facility] leaving with pt to take to [facility]

## 2018-07-14 NOTE — Discharge Instructions (Signed)
Okay for weightbearing as tolerated to the right lower extremity. Maintain knee immobilizer at all times unless icing the knee. Complete your course of Keflex for 7 days. Resume your other postoperative medications as prescribed by Dr. Theda Sers. Follow-up with Dr. Theda Sers in 1 week for wound check.

## 2018-07-14 NOTE — ED Notes (Signed)
ED Provider at bedside. 

## 2018-07-14 NOTE — Anesthesia Preprocedure Evaluation (Addendum)
Anesthesia Evaluation  Patient identified by MRN, date of birth, ID band Patient awake    Reviewed: Allergy & Precautions, H&P , NPO status , Patient's Chart, lab work & pertinent test results  Airway Mallampati: II   Neck ROM: full    Dental   Pulmonary Current Smoker,    breath sounds clear to auscultation       Cardiovascular negative cardio ROS   Rhythm:regular Rate:Normal     Neuro/Psych  Headaches,    GI/Hepatic GERD  ,  Endo/Other  Hypothyroidism   Renal/GU stones     Musculoskeletal  (+) Arthritis ,   Abdominal   Peds  Hematology   Anesthesia Other Findings   Reproductive/Obstetrics                             Anesthesia Physical Anesthesia Plan  ASA: II  Anesthesia Plan: General   Post-op Pain Management:    Induction: Intravenous  PONV Risk Score and Plan: 1 and Ondansetron, Dexamethasone, Midazolam and Treatment may vary due to age or medical condition  Airway Management Planned: LMA  Additional Equipment:   Intra-op Plan:   Post-operative Plan:   Informed Consent: I have reviewed the patients History and Physical, chart, labs and discussed the procedure including the risks, benefits and alternatives for the proposed anesthesia with the patient or authorized representative who has indicated his/her understanding and acceptance.       Plan Discussed with: CRNA, Anesthesiologist and Surgeon  Anesthesia Plan Comments:         Anesthesia Quick Evaluation

## 2018-07-14 NOTE — ED Provider Notes (Signed)
Patient transferred from [facility] urgency department for postoperative complication.  Vital signs are stable.  Discussed with Dr. Stann Mainland.  Is planning to take to the operating room.  Asked to give patient 2 g of Ancef.   Julianne Rice, MD 07/14/18 2102

## 2018-07-14 NOTE — H&P (Signed)
ORTHOPAEDIC H&P  REQUESTING PHYSICIAN: No att. providers found  PCP:  [name], MD  Chief Complaint: Right total knee wound dehiscence  HPI: Frank Weber is a 68 y.o. male who complains of increased bleeding and wound dehiscence along the distal aspect of his right total knee incision earlier today.  He was 4 days status post right total knee arthroplasty done by my partner Dr. Theda Sers.  He was discharged from the hospital earlier today after relatively uneventful postoperative course other than some increasing pain in the right medial thigh.  That was resolving and he was discharged home.  While at the pharmacy picking up his pain medication he noted bleeding from his incision.  He presented to the local emergency department in [place] where he was noted to have the above stated wound dehiscence.  There was no gross contamination.  He was transferred down to [facility] for irrigation and debridement and reclosure of the wound.  He was given IV antibiotics per my request in the emergency department.  We will give another dose preoperatively.  He denies any numbness or tingling.  He denies any increased pain above postoperative pain.  He denies feeling lightheaded or chest pain or nausea or vomiting.  Past Medical History:  Diagnosis Date  . Arthritis    right knee  . Constipation   . Frequency of urination   . GERD (gastroesophageal reflux disease)   . Heart murmur    "slight"   . History of kidney stones   . History of melanoma excision    2012--  NECK/ HEAD  . History of squamous cell carcinoma excision    RIGHT EAR  . Hyperlipidemia   . Hypothyroidism   . IBS (irritable bowel syndrome)    states slight  . Lumbar stenosis    L5 - S1  . Migraines   . Peripheral neuropathy    legs and feet  . Right flank pain   . Right ureteral stone   . Urgency of urination   . Wears glasses    Past Surgical History:  Procedure Laterality Date  . CARDIAC  CATHETERIZATION  07-08-2007  dr Tami Ribas    no significant CAD, preserved LVF, ef 50%//  30% pLAD  . CARDIOVASCULAR STRESS TEST  06-20-2011  dr croitoru   Low risk study/  normal perfusion scan showing attenuation artifact in the anterior region of myocardium with no ischemia , infarct or scar/  normal LV funciton and wall motion , ef 62%  . COLONOSCOPY N/A 02/17/2013   Procedure: COLONOSCOPY;  Surgeon: Rogene Houston, MD;  Location: [facility];  Service: Endoscopy;  Laterality: N/A;  225  . CYSTOSCOPY W/ URETERAL STENT PLACEMENT Right 06/18/2015   Procedure: CYSTOSCOPY WITH RIGHT RETROGRADE PYELOGRAM RIGHT URETERAL STENT PLACEMENT;  Surgeon: [name], MD;  Location: [facility];  Service: Urology;  Laterality: Right;  . CYSTOSCOPY/URETEROSCOPY/HOLMIUM LASER/STENT PLACEMENT Right 06/27/2015   Procedure: CYSTOSCOPY RIGHT URETEROSCOPY  STENT REMOVAL; STONE EXTRACTION WITH BASKET;  Surgeon: [name], MD;  Location: [facility];  Service: Urology;  Laterality: Right;  . HEAD & NECK SKIN LESION EXCISIONAL BIOPSY    . HOLMIUM LASER APPLICATION Right 7/84/6962   Procedure: HOLMIUM LASER LITHOTRIPSY ;  Surgeon: [name], MD;  Location: [facility];  Service: Urology;  Laterality: Right;  . INGUINAL HERNIA REPAIR Left 10/21/2007  . KNEE ARTHROSCOPY W/ ACL RECONSTRUCTION Right 1992  . MENISCUS REPAIR Right    2019, dr Theda Sers   .  SHOULDER ARTHROSCOPY WITH OPEN ROTATOR CUFF REPAIR Left 07/27/2014   Procedure: LEFT SHOULDER ARTHROSCOPY WITH MINI OPEN ROTATOR CUFF REPAIR;  Surgeon: [name], MD;  Location: [facility];  Service: Orthopedics;  Laterality: Left;  . SHOULDER ARTHROSCOPY WITH OPEN ROTATOR CUFF REPAIR Right 2006  . TONSILLECTOMY AND ADENOIDECTOMY  age 37  . TOTAL KNEE ARTHROPLASTY Right 07/10/2018   Procedure: TOTAL KNEE ARTHROPLASTY;  Surgeon: [name], MD;  Location: [facility];  Service: Orthopedics;  Laterality: Right;  . TRANSTHORACIC  ECHOCARDIOGRAM  07-30-2007   normal LVF, ef >55%/  mild AR, MR , PR and TR/  mild AV sclerosis without stenosis/   . URETEROSCOPY Right 06/18/2015   Procedure: URETEROSCOPY;  Surgeon: [name], MD;  Location: [facility];  Service: Urology;  Laterality: Right;   Social History   Socioeconomic History  . Marital status: Married    Spouse name: Not on file  . Number of children: Not on file  . Years of education: Not on file  . Highest education level: Not on file  Occupational History  . Not on file  Social Needs  . Financial resource strain: Not on file  . Food insecurity:    Worry: Not on file    Inability: Not on file  . Transportation needs:    Medical: Not on file    Non-medical: Not on file  Tobacco Use  . Smoking status: Current Some Day Smoker    Packs/day: 0.00    Years: 30.00    Pack years: 0.00    Types: Cigars  . Smokeless tobacco: Never Used  . Tobacco comment: smokes an occasional cigar  Substance and Sexual Activity  . Alcohol use: Yes    Comment: seldom   . Drug use: No  . Sexual activity: Not on file  Lifestyle  . Physical activity:    Days per week: Not on file    Minutes per session: Not on file  . Stress: Not on file  Relationships  . Social connections:    Talks on phone: Not on file    Gets together: Not on file    Attends religious service: Not on file    Active member of club or organization: Not on file    Attends meetings of clubs or organizations: Not on file    Relationship status: Not on file  Other Topics Concern  . Not on file  Social History Narrative  . Not on file   Family History  Problem Relation Age of Onset  . Colon cancer Other        Age of Onset-late 61's   Allergies  Allergen Reactions  . Sulfa Antibiotics Shortness Of Breath and Rash  . Coconut Oil Rash   Prior to Admission medications   Medication Sig Start Date End Date Taking? Authorizing Provider  cyclobenzaprine (FLEXERIL) 10 MG tablet Take 10 mg by mouth  every 8 (eight) hours as needed for muscle spasms.  06/13/18  Yes [provider]  diclofenac sodium (VOLTAREN) 1 % GEL Apply 2-4 g topically 4 (four) times daily as needed (for neuropathic pain).    Yes [provider]  DULoxetine (CYMBALTA) 30 MG capsule Take 30 mg by mouth at bedtime.     Yes [provider]  gabapentin (NEURONTIN) 300 MG capsule Take 900 mg by mouth 4 (four) times daily.    Yes [provider]  levothyroxine (SYNTHROID, LEVOTHROID) 25 MCG tablet Take 25 mcg by mouth daily before breakfast.  Yes [provider]  LORazepam (ATIVAN) 0.5 MG tablet Take 1-1.5 mg by mouth at bedtime as needed for anxiety or sleep.    Yes [provider]  Polyethyl Glycol-Propyl Glycol (SYSTANE ULTRA) 0.4-0.3 % SOLN Place 2 drops into both eyes as needed (for dry eyes).    Yes [provider]  pravastatin (PRAVACHOL) 40 MG tablet Take 40 mg by mouth at bedtime.    Yes [provider]  Probiotic Product (TRUBIOTICS PO) Take 1 capsule by mouth at bedtime.   Yes [provider]  rizatriptan (MAXALT-MLT) 10 MG disintegrating tablet Take 10 mg by mouth as needed (for migraines and may repeat once in 2 hours, if no relief).    Yes [provider]  aspirin EC 325 MG tablet Take 1 tablet (325 mg total) by mouth 2 (two) times daily. 07/10/18 07/10/19  Stilwell, Bryson L, PA-C  HYDROmorphone (DILAUDID) 2 MG tablet Take 1-2 tablets (2-4 mg total) by mouth every 4 (four) hours as needed for up to 7 days for moderate pain or severe pain (1 pill scale 1-5; 2 pills scale 6-10). 07/14/18 07/21/18  Stilwell, Sueanne Margarita, PA-C  methocarbamol (ROBAXIN) 500 MG tablet Take 1 tablet (500 mg total) by mouth 4 (four) times daily. 07/10/18   Stilwell, Bryson L, PA-C  oxyCODONE (ROXICODONE) 5 MG immediate release tablet Take 1 tablet (5 mg total) by mouth every 4 (four) hours as needed for up to 7 days. Patient not taking: Reported on 07/14/2018 07/10/18  07/17/18  Lajean Manes, PA-C   Dg Knee Right Port  Result Date: 07/13/2018 CLINICAL DATA:  Status post knee replacement 07/10/2018.  Pain. EXAM: PORTABLE RIGHT KNEE - 1-2 VIEW COMPARISON:  None. FINDINGS: Right knee arthroplasty is in place. No hardware complication is seen. Soft tissues about the anterior aspect of the knee are swollen most consistent with postoperative change. No fracture or other focal bony abnormality. IMPRESSION: Anterior soft tissue swelling about the knee is presumably related to surgery. No acute abnormality with a knee arthroplasty in place. Electronically Signed   By: Inge Rise M.D.   On: 07/13/2018 16:15   Dg Femur Port, Min 2 Views Right  Result Date: 07/13/2018 CLINICAL DATA:  Status post right knee replacement 07/10/2018. Right upper leg pain. Initial encounter. EXAM: RIGHT FEMUR PORTABLE 2 VIEW COMPARISON:  None. FINDINGS: There is no evidence of fracture or other focal bone lesions. Soft tissues swelling about the knee and a small amount of gas in the soft tissues are most consistent with postoperative change. IMPRESSION: No acute abnormality. Electronically Signed   By: Inge Rise M.D.   On: 07/13/2018 16:16    Positive ROS: All other systems have been reviewed and were otherwise negative with the exception of those mentioned in the HPI and as above.  Physical Exam: General: Alert, no acute distress Cardiovascular: No pedal edema Respiratory: No cyanosis, no use of accessory musculature GI: No organomegaly, abdomen is soft and non-tender Skin: No lesions in the area of chief complaint Neurologic: Sensation intact distally Psychiatric: Patient is competent for consent with normal mood and affect Lymphatic: No axillary or cervical lymphadenopathy  MUSCULOSKELETAL:  Right knee:  Demonstrates some bruising along the distal medial thigh this is not warm to touch but somewhat tender.  Otherwise distally the calf is soft and nontender.  He has  neurovascularly intact distal.  He has a bloody bandage around the knee that we left in place as this was just applied in the  ED.  Assessment: 1.  Right total knee wound dehiscence  Plan: -Our plan will be for irrigation and debridement of the wound dehiscence with repeat closure.  We will plan for postoperative antibiotics for 7 days with oral Keflex.  He will be placed in a knee immobilizer he can weight-bear as tolerated but we will limit flexion due to the concern for wound tension.  We will have him follow-up with Dr. Theda Sers in 1 week for wound checkup.    Nicholes Stairs, MD Cell [phone]    07/14/2018 10:22 PM

## 2018-07-14 NOTE — Anesthesia Procedure Notes (Signed)
Procedure Name: LMA Insertion Performed by: Judit Awad H, CRNA Pre-anesthesia Checklist: Patient identified, Emergency Drugs available, Suction available and Patient being monitored Patient Re-evaluated:Patient Re-evaluated prior to induction Oxygen Delivery Method: Circle System Utilized Preoxygenation: Pre-oxygenation with 100% oxygen Induction Type: IV induction Ventilation: Mask ventilation without difficulty LMA: LMA inserted LMA Size: 5.0 Number of attempts: 1 Airway Equipment and Method: Bite block Placement Confirmation: positive ETCO2 Tube secured with: Tape Dental Injury: Teeth and Oropharynx as per pre-operative assessment        

## 2018-07-14 NOTE — Progress Notes (Signed)
Physical Therapy Treatment Patient Details Name: Frank Weber MRN: [id number] DOB: 09/02/1950 Today's Date: 07/14/2018    History of Present Illness 68 yo male s/p R TKR on 07/10/18. PMH includes OA, IBS, peripheral neuropathy, GERD, HLD, kidney stones with cystoscopy, R knee arthroscopy, L and R RTC repair.     PT Comments    Pt feeling better. Reviewed stairs, gait, RW safety; pt has HEP for practice later today; ready for d/c   Follow Up Recommendations  Follow surgeon's recommendation for DC plan and follow-up therapies;Supervision for mobility/OOB     Equipment Recommendations  Rolling walker with 5" wheels;3in1 (PT)(has crutches per pt)    Recommendations for Other Services       Precautions / Restrictions Precautions Precautions: Fall;Knee Precaution Comments: independent SLRs Restrictions Weight Bearing Restrictions: No Other Position/Activity Restrictions: WBAT     Mobility  Bed Mobility Overal bed mobility: Modified Independent                Transfers   Equipment used: Rolling walker (2 wheeled) Transfers: Sit to/from Stand Sit to Stand: Supervision         General transfer comment: VCs hand placement  Ambulation/Gait Ambulation/Gait assistance: Supervision;Min guard Gait Distance (Feet): 200 Feet Assistive device: Rolling walker (2 wheeled) Gait Pattern/deviations: Decreased stance time - right;Decreased weight shift to right;Antalgic;Step-to pattern Gait velocity: decr    General Gait Details: improved wt shift to right with incr distance, improved fluidity of gait last 60', pain not elevated with mobility   Stairs Stairs: Yes Stairs assistance: Min guard Stair Management: One rail Right;Step to pattern;Forwards;With crutches Number of Stairs: 7 General stair comments: cues for sequence and foot/crutch placement   Wheelchair Mobility    Modified Rankin (Stroke Patients Only)       Balance                                             Cognition Arousal/Alertness: Awake/alert Behavior During Therapy: WFL for tasks assessed/performed Overall Cognitive Status: Within Functional Limits for tasks assessed                                 General Comments: sleeping on arrival      Exercises Total Joint Exercises Ankle Circles/Pumps: AROM;Both;10 reps;Supine Quad Sets: AROM;Both;5 reps Goniometric ROM: ~5* to 95* knee flexion    General Comments        Pertinent Vitals/Pain Pain Assessment: 0-10 Pain Score: 4  Pain Location: R knee and thigh Pain Descriptors / Indicators: Aching;Throbbing;Sore Pain Intervention(s): Limited activity within patient's tolerance;Monitored during session    Home Living                      Prior Function            PT Goals (current goals can now be found in the care plan section) Acute Rehab PT Goals Patient Stated Goal: home repairs, cooking, walk without pain Time For Goal Achievement: 07/17/18 Progress towards PT goals: Progressing toward goals    Frequency    7X/week      PT Plan Current plan remains appropriate    Co-evaluation              AM-PAC PT "6 Clicks" Mobility   Outcome Measure  Help needed turning from  your back to your side while in a flat bed without using bedrails?: None Help needed moving from lying on your back to sitting on the side of a flat bed without using bedrails?: None Help needed moving to and from a bed to a chair (including a wheelchair)?: A Little Help needed standing up from a chair using your arms (e.g., wheelchair or bedside chair)?: A Little Help needed to walk in hospital room?: A Little Help needed climbing 3-5 steps with a railing? : A Little 6 Click Score: 20    End of Session Equipment Utilized During Treatment: Gait belt Activity Tolerance: Patient tolerated treatment well Patient left: Other (comment)(bathroom--NT aware) Nurse Communication: Mobility status PT  Visit Diagnosis: Other abnormalities of gait and mobility (R26.89);Difficulty in walking, not elsewhere classified (R26.2);Pain Pain - Right/Left: Right Pain - part of body: Knee     Time: 5188-4166 PT Time Calculation (min) (ACUTE ONLY): 14 min  Charges:  $Gait Training: 8-22 mins                     Kenyon Ana, PT  Pager: [phone] Acute Rehab Dept [facility]): [phone]   07/14/2018    [facility] 07/14/2018, 11:29 AM

## 2018-07-14 NOTE — ED Notes (Signed)
Pt's right knee post op wound is dehisce 4 cm from the bottom of the sx wound. Bleeding controled blood clot in place wound redressed per dr orders.

## 2018-07-14 NOTE — Progress Notes (Signed)
At 1241, the pt was provided with d/c instructions and prescriptions. After discussing the pt's plan of care upon d/c home, the pt reported no further questions or concerns. The pt reported that he has called his ride and waiting for them to arrive.

## 2018-07-14 NOTE — Transfer of Care (Signed)
Immediate Anesthesia Transfer of Care Note  Patient: Frank Weber  Procedure(s) Performed: IRRIGATION AND DEBRIDEMENT RIGHT KNEE WITH CLOSURE (Right Knee)  Patient Location: PACU  Anesthesia Type:General  Level of Consciousness: awake and alert   Airway & Oxygen Therapy: Patient connected to nasal cannula oxygen  Post-op Assessment: Report given to RN and Post -op Vital signs reviewed and stable  Post vital signs: Reviewed and stable  Last Vitals:  Vitals Value Taken Time  BP 112/79 07/14/2018 11:33 PM  Temp    Pulse 71 07/14/2018 11:34 PM  Resp 13 07/14/2018 11:34 PM  SpO2 93 % 07/14/2018 11:34 PM  Vitals shown include unvalidated device data.  Last Pain:  Vitals:   07/14/18 2128  TempSrc:   PainSc: 10-Worst pain ever         Complications: No apparent anesthesia complications

## 2018-07-15 ENCOUNTER — Encounter (HOSPITAL_COMMUNITY): Payer: Self-pay | Admitting: *Deleted

## 2018-07-15 DIAGNOSIS — E785 Hyperlipidemia, unspecified: Secondary | ICD-10-CM | POA: Insufficient documentation

## 2018-07-15 DIAGNOSIS — Y831 Surgical operation with implant of artificial internal device as the cause of abnormal reaction of the patient, or of later complication, without mention of misadventure at the time of the procedure: Secondary | ICD-10-CM | POA: Insufficient documentation

## 2018-07-15 DIAGNOSIS — T8131XA Disruption of external operation (surgical) wound, not elsewhere classified, initial encounter: Secondary | ICD-10-CM | POA: Diagnosis not present

## 2018-07-15 DIAGNOSIS — Z85820 Personal history of malignant melanoma of skin: Secondary | ICD-10-CM | POA: Diagnosis not present

## 2018-07-15 DIAGNOSIS — K219 Gastro-esophageal reflux disease without esophagitis: Secondary | ICD-10-CM | POA: Diagnosis not present

## 2018-07-15 DIAGNOSIS — G629 Polyneuropathy, unspecified: Secondary | ICD-10-CM | POA: Diagnosis not present

## 2018-07-15 DIAGNOSIS — Z79899 Other long term (current) drug therapy: Secondary | ICD-10-CM | POA: Insufficient documentation

## 2018-07-15 DIAGNOSIS — Z79890 Hormone replacement therapy (postmenopausal): Secondary | ICD-10-CM | POA: Insufficient documentation

## 2018-07-15 DIAGNOSIS — G43909 Migraine, unspecified, not intractable, without status migrainosus: Secondary | ICD-10-CM | POA: Diagnosis not present

## 2018-07-15 DIAGNOSIS — F17290 Nicotine dependence, other tobacco product, uncomplicated: Secondary | ICD-10-CM | POA: Insufficient documentation

## 2018-07-15 DIAGNOSIS — E039 Hypothyroidism, unspecified: Secondary | ICD-10-CM | POA: Diagnosis not present

## 2018-07-15 DIAGNOSIS — M25561 Pain in right knee: Secondary | ICD-10-CM | POA: Diagnosis present

## 2018-07-15 DIAGNOSIS — M25761 Osteophyte, right knee: Secondary | ICD-10-CM | POA: Insufficient documentation

## 2018-07-15 DIAGNOSIS — M1711 Unilateral primary osteoarthritis, right knee: Secondary | ICD-10-CM | POA: Diagnosis not present

## 2018-07-15 LAB — HIV ANTIBODY (ROUTINE TESTING W REFLEX): HIV Screen 4th Generation wRfx: NONREACTIVE

## 2018-07-15 MED ORDER — SUMATRIPTAN SUCCINATE 25 MG PO TABS: 25.0000 mg | ORAL_TABLET | Freq: Two times a day (BID) | ORAL | Status: DC | PRN

## 2018-07-15 MED ORDER — DULOXETINE HCL 30 MG PO CPEP
30.0000 mg | ORAL_CAPSULE | Freq: Every day | ORAL | Status: DC
  Administered 2018-07-15: 30 mg via ORAL
  Filled 2018-07-15: qty 1

## 2018-07-15 MED ORDER — ACETAMINOPHEN 500 MG PO TABS
1000.0000 mg | ORAL_TABLET | Freq: Four times a day (QID) | ORAL | Status: DC
  Administered 2018-07-15 (×2): 1000 mg via ORAL
  Filled 2018-07-15 (×2): qty 2

## 2018-07-15 MED ORDER — CEFAZOLIN SODIUM-DEXTROSE 2-4 GM/100ML-% IV SOLN
2.0000 g | Freq: Three times a day (TID) | INTRAVENOUS | Status: DC
  Administered 2018-07-15: 2 g via INTRAVENOUS
  Filled 2018-07-15 (×2): qty 100

## 2018-07-15 MED ORDER — ONDANSETRON HCL 4 MG PO TABS: 4.0000 mg | ORAL_TABLET | Freq: Four times a day (QID) | ORAL | Status: DC | PRN

## 2018-07-15 MED ORDER — ONDANSETRON HCL 4 MG/2ML IJ SOLN: 4.0000 mg | Freq: Four times a day (QID) | INTRAMUSCULAR | Status: DC | PRN

## 2018-07-15 MED ORDER — ACETAMINOPHEN 650 MG RE SUPP: 650.0000 mg | Freq: Four times a day (QID) | RECTAL | Status: DC | PRN

## 2018-07-15 MED ORDER — CYCLOBENZAPRINE HCL 10 MG PO TABS
10.0000 mg | ORAL_TABLET | Freq: Three times a day (TID) | ORAL | Status: DC | PRN
  Administered 2018-07-15: 10 mg via ORAL
  Filled 2018-07-15: qty 1

## 2018-07-15 MED ORDER — ACETAMINOPHEN 325 MG PO TABS: 650.0000 mg | ORAL_TABLET | Freq: Four times a day (QID) | ORAL | Status: DC | PRN

## 2018-07-15 MED ORDER — LEVOTHYROXINE SODIUM 25 MCG PO TABS
25.0000 ug | ORAL_TABLET | Freq: Every day | ORAL | Status: DC
  Administered 2018-07-15: 25 ug via ORAL
  Filled 2018-07-15: qty 1

## 2018-07-15 MED ORDER — OXYCODONE HCL 5 MG PO TABS
5.0000 mg | ORAL_TABLET | ORAL | Status: DC | PRN
  Administered 2018-07-15 (×2): 10 mg via ORAL
  Filled 2018-07-15 (×2): qty 2

## 2018-07-15 MED ORDER — PRAVASTATIN SODIUM 40 MG PO TABS
40.0000 mg | ORAL_TABLET | Freq: Every day | ORAL | Status: DC
  Administered 2018-07-15: 40 mg via ORAL
  Filled 2018-07-15: qty 1

## 2018-07-15 MED ORDER — LORAZEPAM 1 MG PO TABS: 1.0000 mg | ORAL_TABLET | Freq: Every evening | ORAL | Status: DC | PRN

## 2018-07-15 MED ORDER — GABAPENTIN 300 MG PO CAPS
900.0000 mg | ORAL_CAPSULE | Freq: Three times a day (TID) | ORAL | Status: DC
  Administered 2018-07-15 (×2): 900 mg via ORAL
  Filled 2018-07-15 (×2): qty 3

## 2018-07-15 MED ORDER — CEPHALEXIN 500 MG PO CAPS: 500.0000 mg | ORAL_CAPSULE | Freq: Four times a day (QID) | ORAL | 0 refills | Status: AC

## 2018-07-15 MED ORDER — MORPHINE SULFATE (PF) 2 MG/ML IV SOLN
2.0000 mg | INTRAVENOUS | Status: DC | PRN
  Administered 2018-07-15: 2 mg via INTRAVENOUS
  Filled 2018-07-15: qty 1

## 2018-07-15 NOTE — Progress Notes (Cosign Needed)
Subjective: 1 Day Post-Op Procedure(s) (LRB): IRRIGATION AND DEBRIDEMENT RIGHT KNEE WITH CLOSURE (Right) Patient reports pain as mild.  Tolerating PO's. Reports a good night. No complaints this am.  Denies CP or SOB.   Objective: Vital signs in last 24 hours: Temp:  [98.1 F (36.7 C)-98.5 F (36.9 C)] 98.4 F (36.9 C) (02/05 0044) Pulse Rate:  [61-85] 76 (02/05 0044) Resp:  [10-21] 16 (02/05 0044) BP: (105-136)/(62-121) 134/76 (02/05 0044) SpO2:  [86 %-99 %] 96 % (02/05 0044) Weight:  [98.9 kg] 98.9 kg (02/05 0044)  Intake/Output from previous day: 02/04 0701 - 02/05 0700 In: -  Out: 1200 [PJKDT:2671; Blood:25] Intake/Output this shift: No intake/output data recorded.  Recent Labs    07/13/18 0424  HGB 11.0*   Recent Labs    07/13/18 0424  WBC 8.7  RBC 3.33*  HCT 34.0*  PLT 180   No results for input(s): NA, K, CL, CO2, BUN, CREATININE, GLUCOSE, CALCIUM in the last 72 hours. No results for input(s): LABPT, INR in the last 72 hours.  Well nourished. Alert and oriented x3. RRR, Lungs clear, BS x4. Abdomen soft and non tender. Right Calf soft and non tender. Right knee dressing C/D/I. No DVT signs. Compartment soft. No signs of infection.  Right LE grossly neurovascular intact. Brace in place.   Patient's anticipated LOS is less than 2 midnights, meeting these requirements: - Younger than 40 - Lives within 1 hour of care - Has a competent adult at home to recover with post-op recover - NO history of  - Chronic pain requiring opiods  - Diabetes  - Coronary Artery Disease  - Heart failure  - Heart attack  - Stroke  - DVT/VTE  - Cardiac arrhythmia  - Respiratory Failure/COPD  - Renal failure  - Anemia  - Advanced Liver disease      Assessment/Plan: 1 Day Post-Op Procedure(s) (LRB): IRRIGATION AND DEBRIDEMENT RIGHT KNEE WITH CLOSURE (Right) Discharge home with home health  Remain in brace Keflex and ASA at D/c Stop Dose pak Follow instructions F/u in  1 week    Madalaine Portier L Jedediah Noda 07/15/2018, 8:05 AM

## 2018-07-15 NOTE — Progress Notes (Signed)
Pt given discharge instructions and prescriptions. Instructions gone over with him and answered all questions. Pt explained how to do dressing changes. Phone and glasses retrieved from security and given to pt. Pt waiting on ride.

## 2018-07-16 DIAGNOSIS — M48061 Spinal stenosis, lumbar region without neurogenic claudication: Secondary | ICD-10-CM | POA: Diagnosis not present

## 2018-07-16 DIAGNOSIS — Z96651 Presence of right artificial knee joint: Secondary | ICD-10-CM | POA: Diagnosis not present

## 2018-07-16 DIAGNOSIS — G609 Hereditary and idiopathic neuropathy, unspecified: Secondary | ICD-10-CM | POA: Diagnosis not present

## 2018-07-16 DIAGNOSIS — Z471 Aftercare following joint replacement surgery: Secondary | ICD-10-CM | POA: Diagnosis not present

## 2018-07-16 DIAGNOSIS — Z85820 Personal history of malignant melanoma of skin: Secondary | ICD-10-CM | POA: Diagnosis not present

## 2018-07-16 DIAGNOSIS — T8131XD Disruption of external operation (surgical) wound, not elsewhere classified, subsequent encounter: Secondary | ICD-10-CM | POA: Diagnosis not present

## 2018-07-16 DIAGNOSIS — Z85828 Personal history of other malignant neoplasm of skin: Secondary | ICD-10-CM | POA: Diagnosis not present

## 2018-07-16 NOTE — Anesthesia Postprocedure Evaluation (Signed)
Anesthesia Post Note  Patient: Frank Weber  Procedure(s) Performed: IRRIGATION AND DEBRIDEMENT RIGHT KNEE WITH CLOSURE (Right Knee)     Patient location during evaluation: PACU Anesthesia Type: General Level of consciousness: awake and alert Pain management: pain level controlled Vital Signs Assessment: post-procedure vital signs reviewed and stable Respiratory status: spontaneous breathing, nonlabored ventilation, respiratory function stable and patient connected to nasal cannula oxygen Cardiovascular status: blood pressure returned to baseline and stable Postop Assessment: no apparent nausea or vomiting Anesthetic complications: no    Last Vitals:  Vitals:   07/15/18 0044 07/15/18 0927  BP: 134/76 (!) 94/57  Pulse: 76 60  Resp: 16 16  Temp: 36.9 C 36.6 C  SpO2: 96% 96%    Last Pain:  Vitals:   07/15/18 1023  TempSrc:   PainSc: 4    Pain Goal: Patients Stated Pain Goal: 3 (07/15/18 1023)                 Blu Lori S

## 2018-07-17 DIAGNOSIS — G609 Hereditary and idiopathic neuropathy, unspecified: Secondary | ICD-10-CM | POA: Diagnosis not present

## 2018-07-17 DIAGNOSIS — Z471 Aftercare following joint replacement surgery: Secondary | ICD-10-CM | POA: Diagnosis not present

## 2018-07-17 DIAGNOSIS — M48061 Spinal stenosis, lumbar region without neurogenic claudication: Secondary | ICD-10-CM | POA: Diagnosis not present

## 2018-07-17 DIAGNOSIS — Z96651 Presence of right artificial knee joint: Secondary | ICD-10-CM | POA: Diagnosis not present

## 2018-07-17 DIAGNOSIS — T8131XD Disruption of external operation (surgical) wound, not elsewhere classified, subsequent encounter: Secondary | ICD-10-CM | POA: Diagnosis not present

## 2018-07-17 DIAGNOSIS — Z85820 Personal history of malignant melanoma of skin: Secondary | ICD-10-CM | POA: Diagnosis not present

## 2018-07-20 DIAGNOSIS — Z85820 Personal history of malignant melanoma of skin: Secondary | ICD-10-CM | POA: Diagnosis not present

## 2018-07-20 DIAGNOSIS — M48061 Spinal stenosis, lumbar region without neurogenic claudication: Secondary | ICD-10-CM | POA: Diagnosis not present

## 2018-07-20 DIAGNOSIS — Z471 Aftercare following joint replacement surgery: Secondary | ICD-10-CM | POA: Diagnosis not present

## 2018-07-20 DIAGNOSIS — Z96651 Presence of right artificial knee joint: Secondary | ICD-10-CM | POA: Diagnosis not present

## 2018-07-20 DIAGNOSIS — T8131XD Disruption of external operation (surgical) wound, not elsewhere classified, subsequent encounter: Secondary | ICD-10-CM | POA: Diagnosis not present

## 2018-07-20 DIAGNOSIS — G609 Hereditary and idiopathic neuropathy, unspecified: Secondary | ICD-10-CM | POA: Diagnosis not present

## 2018-07-21 DIAGNOSIS — T8131XD Disruption of external operation (surgical) wound, not elsewhere classified, subsequent encounter: Secondary | ICD-10-CM | POA: Diagnosis not present

## 2018-07-21 DIAGNOSIS — Z85820 Personal history of malignant melanoma of skin: Secondary | ICD-10-CM | POA: Diagnosis not present

## 2018-07-21 DIAGNOSIS — M48061 Spinal stenosis, lumbar region without neurogenic claudication: Secondary | ICD-10-CM | POA: Diagnosis not present

## 2018-07-21 DIAGNOSIS — Z471 Aftercare following joint replacement surgery: Secondary | ICD-10-CM | POA: Diagnosis not present

## 2018-07-21 DIAGNOSIS — G609 Hereditary and idiopathic neuropathy, unspecified: Secondary | ICD-10-CM | POA: Diagnosis not present

## 2018-07-21 DIAGNOSIS — Z96651 Presence of right artificial knee joint: Secondary | ICD-10-CM | POA: Diagnosis not present

## 2018-07-22 DIAGNOSIS — Z96651 Presence of right artificial knee joint: Secondary | ICD-10-CM | POA: Diagnosis not present

## 2018-07-22 DIAGNOSIS — Z471 Aftercare following joint replacement surgery: Secondary | ICD-10-CM | POA: Diagnosis not present

## 2018-07-23 DIAGNOSIS — T8131XD Disruption of external operation (surgical) wound, not elsewhere classified, subsequent encounter: Secondary | ICD-10-CM | POA: Diagnosis not present

## 2018-07-23 DIAGNOSIS — Z471 Aftercare following joint replacement surgery: Secondary | ICD-10-CM | POA: Diagnosis not present

## 2018-07-23 DIAGNOSIS — Z85820 Personal history of malignant melanoma of skin: Secondary | ICD-10-CM | POA: Diagnosis not present

## 2018-07-23 DIAGNOSIS — G609 Hereditary and idiopathic neuropathy, unspecified: Secondary | ICD-10-CM | POA: Diagnosis not present

## 2018-07-23 DIAGNOSIS — Z96651 Presence of right artificial knee joint: Secondary | ICD-10-CM | POA: Diagnosis not present

## 2018-07-23 DIAGNOSIS — M48061 Spinal stenosis, lumbar region without neurogenic claudication: Secondary | ICD-10-CM | POA: Diagnosis not present

## 2018-07-28 DIAGNOSIS — G609 Hereditary and idiopathic neuropathy, unspecified: Secondary | ICD-10-CM | POA: Diagnosis not present

## 2018-07-28 DIAGNOSIS — Z96651 Presence of right artificial knee joint: Secondary | ICD-10-CM | POA: Diagnosis not present

## 2018-07-28 DIAGNOSIS — M48061 Spinal stenosis, lumbar region without neurogenic claudication: Secondary | ICD-10-CM | POA: Diagnosis not present

## 2018-07-28 DIAGNOSIS — Z85820 Personal history of malignant melanoma of skin: Secondary | ICD-10-CM | POA: Diagnosis not present

## 2018-07-28 DIAGNOSIS — T8131XD Disruption of external operation (surgical) wound, not elsewhere classified, subsequent encounter: Secondary | ICD-10-CM | POA: Diagnosis not present

## 2018-07-28 DIAGNOSIS — Z471 Aftercare following joint replacement surgery: Secondary | ICD-10-CM | POA: Diagnosis not present

## 2018-07-30 DIAGNOSIS — T8131XD Disruption of external operation (surgical) wound, not elsewhere classified, subsequent encounter: Secondary | ICD-10-CM | POA: Diagnosis not present

## 2018-07-30 DIAGNOSIS — G609 Hereditary and idiopathic neuropathy, unspecified: Secondary | ICD-10-CM | POA: Diagnosis not present

## 2018-07-30 DIAGNOSIS — Z96651 Presence of right artificial knee joint: Secondary | ICD-10-CM | POA: Diagnosis not present

## 2018-07-30 DIAGNOSIS — M48061 Spinal stenosis, lumbar region without neurogenic claudication: Secondary | ICD-10-CM | POA: Diagnosis not present

## 2018-07-30 DIAGNOSIS — Z471 Aftercare following joint replacement surgery: Secondary | ICD-10-CM | POA: Diagnosis not present

## 2018-07-30 DIAGNOSIS — Z85820 Personal history of malignant melanoma of skin: Secondary | ICD-10-CM | POA: Diagnosis not present

## 2018-07-31 DIAGNOSIS — Z96651 Presence of right artificial knee joint: Secondary | ICD-10-CM | POA: Diagnosis not present

## 2018-07-31 DIAGNOSIS — T8131XD Disruption of external operation (surgical) wound, not elsewhere classified, subsequent encounter: Secondary | ICD-10-CM | POA: Diagnosis not present

## 2018-07-31 DIAGNOSIS — Z471 Aftercare following joint replacement surgery: Secondary | ICD-10-CM | POA: Diagnosis not present

## 2018-07-31 DIAGNOSIS — Z85820 Personal history of malignant melanoma of skin: Secondary | ICD-10-CM | POA: Diagnosis not present

## 2018-07-31 DIAGNOSIS — M48061 Spinal stenosis, lumbar region without neurogenic claudication: Secondary | ICD-10-CM | POA: Diagnosis not present

## 2018-07-31 DIAGNOSIS — G609 Hereditary and idiopathic neuropathy, unspecified: Secondary | ICD-10-CM | POA: Diagnosis not present

## 2018-08-03 ENCOUNTER — Ambulatory Visit (HOSPITAL_BASED_OUTPATIENT_CLINIC_OR_DEPARTMENT_OTHER)
Admission: RE | Admit: 2018-08-03 | Discharge: 2018-08-03 | Disposition: A | Discharge status: Home / Self Care | Payer: Medicare Other | Source: Ambulatory Visit | Attending: Specialist | Admitting: Specialist

## 2018-08-03 ENCOUNTER — Other Ambulatory Visit (HOSPITAL_BASED_OUTPATIENT_CLINIC_OR_DEPARTMENT_OTHER): Payer: Self-pay | Admitting: Specialist

## 2018-08-03 DIAGNOSIS — R609 Edema, unspecified: Secondary | ICD-10-CM | POA: Diagnosis not present

## 2018-08-03 DIAGNOSIS — M79661 Pain in right lower leg: Secondary | ICD-10-CM | POA: Diagnosis not present

## 2018-08-03 DIAGNOSIS — M7989 Other specified soft tissue disorders: Secondary | ICD-10-CM | POA: Diagnosis not present

## 2018-08-06 DIAGNOSIS — M25561 Pain in right knee: Secondary | ICD-10-CM | POA: Diagnosis not present

## 2018-08-13 DIAGNOSIS — M25561 Pain in right knee: Secondary | ICD-10-CM | POA: Diagnosis not present

## 2018-08-20 DIAGNOSIS — M25561 Pain in right knee: Secondary | ICD-10-CM | POA: Diagnosis not present

## 2018-08-24 DIAGNOSIS — M25561 Pain in right knee: Secondary | ICD-10-CM | POA: Diagnosis not present

## 2018-08-27 DIAGNOSIS — Z6827 Body mass index (BMI) 27.0-27.9, adult: Secondary | ICD-10-CM | POA: Diagnosis not present

## 2018-08-27 DIAGNOSIS — F419 Anxiety disorder, unspecified: Secondary | ICD-10-CM | POA: Diagnosis not present

## 2018-08-27 DIAGNOSIS — E663 Overweight: Secondary | ICD-10-CM | POA: Diagnosis not present

## 2018-09-01 DIAGNOSIS — M25561 Pain in right knee: Secondary | ICD-10-CM | POA: Diagnosis not present

## 2018-09-03 DIAGNOSIS — M25561 Pain in right knee: Secondary | ICD-10-CM | POA: Diagnosis not present

## 2018-09-08 DIAGNOSIS — M25561 Pain in right knee: Secondary | ICD-10-CM | POA: Diagnosis not present

## 2018-10-05 DIAGNOSIS — Z471 Aftercare following joint replacement surgery: Secondary | ICD-10-CM | POA: Diagnosis not present

## 2018-10-05 DIAGNOSIS — Z96651 Presence of right artificial knee joint: Secondary | ICD-10-CM | POA: Diagnosis not present

## 2018-10-16 ENCOUNTER — Telehealth: Payer: Medicare Other | Admitting: Nurse Practitioner

## 2018-10-16 DIAGNOSIS — J029 Acute pharyngitis, unspecified: Secondary | ICD-10-CM

## 2018-10-16 NOTE — Progress Notes (Addendum)

## 2018-10-19 DIAGNOSIS — J029 Acute pharyngitis, unspecified: Secondary | ICD-10-CM | POA: Diagnosis not present

## 2018-10-19 DIAGNOSIS — R070 Pain in throat: Secondary | ICD-10-CM | POA: Diagnosis not present

## 2018-10-19 DIAGNOSIS — E663 Overweight: Secondary | ICD-10-CM | POA: Diagnosis not present

## 2018-10-19 DIAGNOSIS — Z6827 Body mass index (BMI) 27.0-27.9, adult: Secondary | ICD-10-CM | POA: Diagnosis not present

## 2018-11-19 DIAGNOSIS — L814 Other melanin hyperpigmentation: Secondary | ICD-10-CM | POA: Diagnosis not present

## 2018-11-19 DIAGNOSIS — D2221 Melanocytic nevi of right ear and external auricular canal: Secondary | ICD-10-CM | POA: Diagnosis not present

## 2018-11-19 DIAGNOSIS — Z85828 Personal history of other malignant neoplasm of skin: Secondary | ICD-10-CM | POA: Diagnosis not present

## 2018-11-19 DIAGNOSIS — L821 Other seborrheic keratosis: Secondary | ICD-10-CM | POA: Diagnosis not present

## 2018-11-19 DIAGNOSIS — Z85820 Personal history of malignant melanoma of skin: Secondary | ICD-10-CM | POA: Diagnosis not present

## 2018-11-19 DIAGNOSIS — D692 Other nonthrombocytopenic purpura: Secondary | ICD-10-CM | POA: Diagnosis not present

## 2018-12-02 DIAGNOSIS — Z471 Aftercare following joint replacement surgery: Secondary | ICD-10-CM | POA: Diagnosis not present

## 2018-12-02 DIAGNOSIS — Z96651 Presence of right artificial knee joint: Secondary | ICD-10-CM | POA: Diagnosis not present

## 2018-12-07 ENCOUNTER — Other Ambulatory Visit (HOSPITAL_COMMUNITY): Payer: Self-pay | Admitting: Internal Medicine

## 2018-12-07 ENCOUNTER — Ambulatory Visit (HOSPITAL_COMMUNITY)
Admission: RE | Admit: 2018-12-07 | Discharge: 2018-12-07 | Disposition: A | Discharge status: Home / Self Care | Payer: Medicare Other | Source: Ambulatory Visit | Attending: Internal Medicine | Admitting: Internal Medicine

## 2018-12-07 ENCOUNTER — Other Ambulatory Visit: Payer: Self-pay

## 2018-12-07 DIAGNOSIS — F419 Anxiety disorder, unspecified: Secondary | ICD-10-CM | POA: Diagnosis not present

## 2018-12-07 DIAGNOSIS — Z0001 Encounter for general adult medical examination with abnormal findings: Secondary | ICD-10-CM | POA: Diagnosis not present

## 2018-12-07 DIAGNOSIS — Z1389 Encounter for screening for other disorder: Secondary | ICD-10-CM | POA: Diagnosis not present

## 2018-12-07 DIAGNOSIS — R0789 Other chest pain: Secondary | ICD-10-CM | POA: Diagnosis not present

## 2018-12-07 DIAGNOSIS — R079 Chest pain, unspecified: Secondary | ICD-10-CM

## 2018-12-07 DIAGNOSIS — R05 Cough: Secondary | ICD-10-CM | POA: Diagnosis not present

## 2018-12-07 DIAGNOSIS — E663 Overweight: Secondary | ICD-10-CM | POA: Diagnosis not present

## 2018-12-07 DIAGNOSIS — E039 Hypothyroidism, unspecified: Secondary | ICD-10-CM | POA: Diagnosis not present

## 2018-12-07 DIAGNOSIS — R091 Pleurisy: Secondary | ICD-10-CM | POA: Diagnosis not present

## 2018-12-07 DIAGNOSIS — Z6826 Body mass index (BMI) 26.0-26.9, adult: Secondary | ICD-10-CM | POA: Diagnosis not present

## 2018-12-07 DIAGNOSIS — R0602 Shortness of breath: Secondary | ICD-10-CM | POA: Diagnosis not present

## 2019-03-16 DIAGNOSIS — Z23 Encounter for immunization: Secondary | ICD-10-CM | POA: Diagnosis not present

## 2019-03-23 DIAGNOSIS — Z85828 Personal history of other malignant neoplasm of skin: Secondary | ICD-10-CM | POA: Diagnosis not present

## 2019-03-23 DIAGNOSIS — L570 Actinic keratosis: Secondary | ICD-10-CM | POA: Diagnosis not present

## 2019-03-25 DIAGNOSIS — G4709 Other insomnia: Secondary | ICD-10-CM | POA: Diagnosis not present

## 2019-03-25 DIAGNOSIS — Z6826 Body mass index (BMI) 26.0-26.9, adult: Secondary | ICD-10-CM | POA: Diagnosis not present

## 2019-03-25 DIAGNOSIS — E663 Overweight: Secondary | ICD-10-CM | POA: Diagnosis not present

## 2019-03-25 DIAGNOSIS — F419 Anxiety disorder, unspecified: Secondary | ICD-10-CM | POA: Diagnosis not present

## 2019-05-10 DIAGNOSIS — E7849 Other hyperlipidemia: Secondary | ICD-10-CM | POA: Diagnosis not present

## 2019-05-10 DIAGNOSIS — G609 Hereditary and idiopathic neuropathy, unspecified: Secondary | ICD-10-CM | POA: Diagnosis not present

## 2019-07-05 DIAGNOSIS — M25561 Pain in right knee: Secondary | ICD-10-CM | POA: Diagnosis not present

## 2019-07-13 DIAGNOSIS — M25561 Pain in right knee: Secondary | ICD-10-CM | POA: Diagnosis not present

## 2019-07-21 DIAGNOSIS — M25561 Pain in right knee: Secondary | ICD-10-CM | POA: Diagnosis not present

## 2019-07-21 DIAGNOSIS — G4709 Other insomnia: Secondary | ICD-10-CM | POA: Diagnosis not present

## 2019-07-28 DIAGNOSIS — M25561 Pain in right knee: Secondary | ICD-10-CM | POA: Diagnosis not present

## 2019-08-03 DIAGNOSIS — M25561 Pain in right knee: Secondary | ICD-10-CM | POA: Diagnosis not present

## 2019-08-05 DIAGNOSIS — M25561 Pain in right knee: Secondary | ICD-10-CM | POA: Diagnosis not present

## 2019-08-08 DIAGNOSIS — E7849 Other hyperlipidemia: Secondary | ICD-10-CM | POA: Diagnosis not present

## 2019-08-08 DIAGNOSIS — E063 Autoimmune thyroiditis: Secondary | ICD-10-CM | POA: Diagnosis not present

## 2019-08-12 DIAGNOSIS — M25561 Pain in right knee: Secondary | ICD-10-CM | POA: Diagnosis not present

## 2019-08-20 DIAGNOSIS — J310 Chronic rhinitis: Secondary | ICD-10-CM | POA: Diagnosis not present

## 2019-08-20 DIAGNOSIS — R070 Pain in throat: Secondary | ICD-10-CM | POA: Diagnosis not present

## 2019-08-20 DIAGNOSIS — J343 Hypertrophy of nasal turbinates: Secondary | ICD-10-CM | POA: Diagnosis not present

## 2019-08-20 DIAGNOSIS — J342 Deviated nasal septum: Secondary | ICD-10-CM | POA: Diagnosis not present

## 2019-08-23 DIAGNOSIS — M25561 Pain in right knee: Secondary | ICD-10-CM | POA: Diagnosis not present

## 2019-08-26 DIAGNOSIS — M25561 Pain in right knee: Secondary | ICD-10-CM | POA: Diagnosis not present

## 2019-08-31 DIAGNOSIS — M25561 Pain in right knee: Secondary | ICD-10-CM | POA: Diagnosis not present

## 2019-09-01 DIAGNOSIS — Z23 Encounter for immunization: Secondary | ICD-10-CM | POA: Diagnosis not present

## 2019-10-01 DIAGNOSIS — J342 Deviated nasal septum: Secondary | ICD-10-CM | POA: Diagnosis not present

## 2019-10-01 DIAGNOSIS — J343 Hypertrophy of nasal turbinates: Secondary | ICD-10-CM | POA: Diagnosis not present

## 2019-10-01 DIAGNOSIS — J310 Chronic rhinitis: Secondary | ICD-10-CM | POA: Diagnosis not present

## 2019-10-08 DIAGNOSIS — E039 Hypothyroidism, unspecified: Secondary | ICD-10-CM | POA: Diagnosis not present

## 2019-10-08 DIAGNOSIS — E7849 Other hyperlipidemia: Secondary | ICD-10-CM | POA: Diagnosis not present

## 2019-11-02 ENCOUNTER — Emergency Department (HOSPITAL_COMMUNITY): Payer: Medicare Other

## 2019-11-02 ENCOUNTER — Other Ambulatory Visit: Payer: Self-pay

## 2019-11-02 ENCOUNTER — Encounter (HOSPITAL_COMMUNITY): Payer: Self-pay | Admitting: Emergency Medicine

## 2019-11-02 ENCOUNTER — Emergency Department (HOSPITAL_COMMUNITY)
Admission: EM | Admit: 2019-11-02 | Discharge: 2019-11-02 | Disposition: A | Discharge status: Home / Self Care | Payer: Medicare Other | Attending: Emergency Medicine | Admitting: Emergency Medicine

## 2019-11-02 DIAGNOSIS — F17290 Nicotine dependence, other tobacco product, uncomplicated: Secondary | ICD-10-CM | POA: Insufficient documentation

## 2019-11-02 DIAGNOSIS — R634 Abnormal weight loss: Secondary | ICD-10-CM | POA: Diagnosis not present

## 2019-11-02 DIAGNOSIS — E039 Hypothyroidism, unspecified: Secondary | ICD-10-CM | POA: Diagnosis not present

## 2019-11-02 DIAGNOSIS — R079 Chest pain, unspecified: Secondary | ICD-10-CM

## 2019-11-02 DIAGNOSIS — Z79899 Other long term (current) drug therapy: Secondary | ICD-10-CM | POA: Insufficient documentation

## 2019-11-02 DIAGNOSIS — R0789 Other chest pain: Secondary | ICD-10-CM | POA: Diagnosis not present

## 2019-11-02 DIAGNOSIS — Z96651 Presence of right artificial knee joint: Secondary | ICD-10-CM | POA: Diagnosis not present

## 2019-11-02 DIAGNOSIS — R918 Other nonspecific abnormal finding of lung field: Secondary | ICD-10-CM | POA: Diagnosis present

## 2019-11-02 DIAGNOSIS — J439 Emphysema, unspecified: Secondary | ICD-10-CM | POA: Diagnosis not present

## 2019-11-02 LAB — COMPREHENSIVE METABOLIC PANEL
ALT: 12 U/L (ref 0–44)
AST: 16 U/L (ref 15–41)
Albumin: 3.9 g/dL (ref 3.5–5.0)
Alkaline Phosphatase: 65 U/L (ref 38–126)
Anion gap: 9 (ref 5–15)
BUN: 12 mg/dL (ref 8–23)
CO2: 26 mmol/L (ref 22–32)
Calcium: 9.3 mg/dL (ref 8.9–10.3)
Chloride: 105 mmol/L (ref 98–111)
Creatinine, Ser: 1.07 mg/dL (ref 0.61–1.24)
GFR calc Af Amer: >60 mL/min (ref >60)
GFR calc non Af Amer: >60 mL/min (ref >60)
Glucose, Bld: 106 mg/dL — ABNORMAL HIGH (ref 70–99)
Potassium: 4.6 mmol/L (ref 3.5–5.1)
Sodium: 140 mmol/L (ref 135–145)
Total Bilirubin: 0.9 mg/dL (ref 0.3–1.2)
Total Protein: 6.4 g/dL — ABNORMAL LOW (ref 6.5–8.1)

## 2019-11-02 LAB — CBC WITH DIFFERENTIAL/PLATELET
Abs Immature Granulocytes: 0.01 10*3/uL (ref 0.00–0.07)
Basophils Absolute: 0 10*3/uL (ref 0.0–0.1)
Basophils Relative: 1 %
Eosinophils Absolute: 0.2 10*3/uL (ref 0.0–0.5)
Eosinophils Relative: 4 %
HCT: 48.3 % (ref 39.0–52.0)
Hemoglobin: 15.4 g/dL (ref 13.0–17.0)
Immature Granulocytes: 0 %
Lymphocytes Relative: 25 %
Lymphs Abs: 1.3 10*3/uL (ref 0.7–4.0)
MCH: 31.8 pg (ref 26.0–34.0)
MCHC: 31.9 g/dL (ref 30.0–36.0)
MCV: 99.8 fL (ref 80.0–100.0)
Monocytes Absolute: 0.6 10*3/uL (ref 0.1–1.0)
Monocytes Relative: 11 %
Neutro Abs: 3.1 10*3/uL (ref 1.7–7.7)
Neutrophils Relative %: 59 %
Platelets: 191 10*3/uL (ref 150–400)
RBC: 4.84 MIL/uL (ref 4.22–5.81)
RDW: 13 % (ref 11.5–15.5)
WBC: 5.3 10*3/uL (ref 4.0–10.5)
nRBC: 0 % (ref 0.0–0.2)

## 2019-11-02 LAB — TSH: TSH: 3.494 u[IU]/mL (ref 0.350–4.500)

## 2019-11-02 LAB — TROPONIN I (HIGH SENSITIVITY): Troponin I (High Sensitivity): 3 ng/L (ref <18)

## 2019-11-02 MED ORDER — FENTANYL CITRATE (PF) 100 MCG/2ML IJ SOLN: 50.0000 ug | Freq: Once | INTRAMUSCULAR | Status: DC

## 2019-11-02 MED ORDER — IOHEXOL 300 MG/ML  SOLN
75.0000 mL | Freq: Once | INTRAMUSCULAR | Status: AC | PRN
  Administered 2019-11-02: 75 mL via INTRAVENOUS

## 2019-11-02 MED ORDER — FENTANYL CITRATE (PF) 100 MCG/2ML IJ SOLN
50.0000 ug | Freq: Once | INTRAMUSCULAR | Status: AC
  Administered 2019-11-02: 50 ug via INTRAVENOUS
  Filled 2019-11-02: qty 2

## 2019-11-02 NOTE — ED Triage Notes (Signed)
PT c/o left sided chest aching worsening with inhalation that radiates into his left upper back intermittently x3 months but worsening and becoming more frequent over the past 2-3 days. PT able to make full sentences and walked from triage to room with no SOB noted. PT states he has been smoking cigars more heavily over the past year.

## 2019-11-02 NOTE — Discharge Instructions (Signed)
If you start getting new or significant change in symptoms please reach out to the physician immediately you can come back to the emergency department if you need.  We are unable to find any urgent indication for why this chest pain was happening for you, you should be able to follow-up with your primary care physician and an outpatient cardiologist to verify this nothing significant that you need to follow-up on.  Please remember that we spoke about the history of weight loss that you should consider having a discussion with your physician about.

## 2019-11-02 NOTE — ED Provider Notes (Signed)
[facility] EMERGENCY DEPARTMENT Provider Note   CSN: [id number] Arrival date & time: 11/02/19  [id number]     History Chief Complaint  Patient presents with  . Chest Pain    Frank Weber is a 69 y.o. male.  HPI    Complains of chest pain slowly worsening over the last month.  States that this is both on the left side of his chest and in a band around his entire front and back approximately the level of the diaphragm.  States this is a feeling of tightness that is occasionally worse if he "really gets going "but not with minor activity.  Denies any new cough or shortness of breath.  No recent sick symptoms or exposures that he is aware of, beyond a minor nasal congestion.  Of note he does have a significant smoking history with cigars and cigarettes, probably pack years over 46.  He is Covid vaccinated at this point.  Denies any new recent injuries, bowel symptoms, urinary symptoms, nausea or vomiting, of note does say that he has a 70 pound weight loss in the last year or so unintentionally.  Chart review indicates a 30% LAD occlusion on a cath in 2009, patient says he is not been having regular lung cancer screenings and I cannot find a low-dose chest CT in the chart although chest x-rays have not indicated lung cancer, does have a history of squamous cell skin cancer treated years ago.  Last echo was in 2016 was unremarkable  Past Medical History:  Diagnosis Date  . Arthritis    right knee  . Constipation   . Frequency of urination   . GERD (gastroesophageal reflux disease)   . Heart murmur    "slight"   . History of kidney stones   . History of melanoma excision    2012--  NECK/ HEAD  . History of squamous cell carcinoma excision    RIGHT EAR  . Hyperlipidemia   . Hypothyroidism   . IBS (irritable bowel syndrome)    states slight  . Lumbar stenosis    L5 - S1  . Migraines   . Peripheral neuropathy    legs and feet  . Right flank pain   . Right ureteral stone   . Urgency  of urination   . Wears glasses     Patient Active Problem List   Diagnosis Date Noted  . Open wound of right knee 07/14/2018  . S/P knee replacement 07/10/2018  . Right ureteral stone 06/18/2015  . Unspecified hereditary and idiopathic peripheral neuropathy 01/27/2013  . IBS (irritable bowel syndrome) 01/27/2013  . GERD (gastroesophageal reflux disease) 01/27/2013  . History of migraine headaches 01/27/2013    Past Surgical History:  Procedure Laterality Date  . CARDIAC CATHETERIZATION  07-08-2007  dr Tami Ribas    no significant CAD, preserved LVF, ef 50%//  30% pLAD  . CARDIOVASCULAR STRESS TEST  06-20-2011  dr croitoru   Low risk study/  normal perfusion scan showing attenuation artifact in the anterior region of myocardium with no ischemia , infarct or scar/  normal LV funciton and wall motion , ef 62%  . COLONOSCOPY N/A 02/17/2013   Procedure: COLONOSCOPY;  Surgeon: Rogene Houston, MD;  Location: [facility];  Service: Endoscopy;  Laterality: N/A;  225  . CYSTOSCOPY W/ URETERAL STENT PLACEMENT Right 06/18/2015   Procedure: CYSTOSCOPY WITH RIGHT RETROGRADE PYELOGRAM RIGHT URETERAL STENT PLACEMENT;  Surgeon: [name], MD;  Location: [facility];  Service: Urology;  Laterality:  Right;  Marland Kitchen CYSTOSCOPY/URETEROSCOPY/HOLMIUM LASER/STENT PLACEMENT Right 06/27/2015   Procedure: CYSTOSCOPY RIGHT URETEROSCOPY  STENT REMOVAL; STONE EXTRACTION WITH BASKET;  Surgeon: [name], MD;  Location: [facility];  Service: Urology;  Laterality: Right;  . HEAD & NECK SKIN LESION EXCISIONAL BIOPSY    . HOLMIUM LASER APPLICATION Right [id number]   Procedure: HOLMIUM LASER LITHOTRIPSY ;  Surgeon: [name], MD;  Location: [facility];  Service: Urology;  Laterality: Right;  . I & D EXTREMITY Right 07/14/2018   Procedure: IRRIGATION AND DEBRIDEMENT RIGHT KNEE WITH CLOSURE;  Surgeon: Nicholes Stairs, MD;  Location: [place];  Service: Orthopedics;  Laterality: Right;  . INGUINAL  HERNIA REPAIR Left 10/21/2007  . KNEE ARTHROSCOPY W/ ACL RECONSTRUCTION Right 1992  . MENISCUS REPAIR Right    2019, dr Theda Sers   . SHOULDER ARTHROSCOPY WITH OPEN ROTATOR CUFF REPAIR Left 07/27/2014   Procedure: LEFT SHOULDER ARTHROSCOPY WITH MINI OPEN ROTATOR CUFF REPAIR;  Surgeon: [name], MD;  Location: [place];  Service: Orthopedics;  Laterality: Left;  . SHOULDER ARTHROSCOPY WITH OPEN ROTATOR CUFF REPAIR Right 2006  . TONSILLECTOMY AND ADENOIDECTOMY  age 48  . TOTAL KNEE ARTHROPLASTY Right 07/10/2018   Procedure: TOTAL KNEE ARTHROPLASTY;  Surgeon: [name], MD;  Location: [facility];  Service: Orthopedics;  Laterality: Right;  . TRANSTHORACIC ECHOCARDIOGRAM  07-30-2007   normal LVF, ef >55%/  mild AR, MR , PR and TR/  mild AV sclerosis without stenosis/   . URETEROSCOPY Right 06/18/2015   Procedure: URETEROSCOPY;  Surgeon: [name], MD;  Location: [facility];  Service: Urology;  Laterality: Right;       Family History  Problem Relation Age of Onset  . Colon cancer Other        Age of Onset-late 57's    Social History   Tobacco Use  . Smoking status: Current Some Day Smoker    Packs/day: 0.00    Years: 30.00    Pack years: 0.00    Types: Cigars  . Smokeless tobacco: Never Used  . Tobacco comment: smokes an occasional cigar  Substance Use Topics  . Alcohol use: Yes    Comment: seldom   . Drug use: No    Home Medications Prior to Admission medications   Medication Sig Start Date End Date Taking? Authorizing Provider  cyclobenzaprine (FLEXERIL) 10 MG tablet Take 10 mg by mouth every 8 (eight) hours as needed for muscle spasms.  06/13/18   [provider]  diclofenac sodium (VOLTAREN) 1 % GEL Apply 2-4 g topically 4 (four) times daily as needed (for neuropathic pain).     [provider]  DULoxetine (CYMBALTA) 30 MG capsule Take 30 mg by mouth at bedtime.      [provider]  gabapentin (NEURONTIN) 300 MG capsule Take 900 mg  by mouth 4 (four) times daily.     [provider]  levothyroxine (SYNTHROID, LEVOTHROID) 25 MCG tablet Take 25 mcg by mouth daily before breakfast.    [provider]  LORazepam (ATIVAN) 0.5 MG tablet Take 1-1.5 mg by mouth at bedtime as needed for anxiety or sleep.     [provider]  methocarbamol (ROBAXIN) 500 MG tablet Take 1 tablet (500 mg total) by mouth 4 (four) times daily. 07/10/18   Stilwell, Bryson L, PA-C  Polyethyl Glycol-Propyl Glycol (SYSTANE ULTRA) 0.4-0.3 % SOLN Place 2 drops into both eyes as needed (for dry eyes).     [provider]  pravastatin (PRAVACHOL) 40 MG tablet Take 40 mg by mouth at bedtime.     [provider]  Probiotic Product (TRUBIOTICS PO) Take 1 capsule by mouth at bedtime.    [provider]  rizatriptan (MAXALT-MLT) 10 MG disintegrating tablet Take 10 mg by mouth as needed (for migraines and may repeat once in 2 hours, if no relief).     [provider]    Allergies    Sulfa antibiotics and Coconut oil  Review of Systems   Review of Systems  Constitutional: Positive for unexpected weight change. Negative for activity change, appetite change, chills, diaphoresis, fatigue and fever.  HENT: Positive for congestion and rhinorrhea. Negative for dental problem, drooling, ear discharge, ear pain, facial swelling, hearing loss, mouth sores, nosebleeds, postnasal drip, sinus pressure, sinus pain, sneezing, sore throat, tinnitus, trouble swallowing and voice change.   Eyes: Negative.   Respiratory: Positive for chest tightness. Negative for apnea, cough, choking, shortness of breath, wheezing and stridor.   Cardiovascular: Positive for chest pain. Negative for palpitations and leg swelling.  Gastrointestinal: Negative.   Genitourinary: Negative.   Musculoskeletal: Negative.   Skin: Negative.   Neurological: Negative.   Psychiatric/Behavioral: Negative.     Physical Exam Updated Vital Signs BP  (!) 94/52   Pulse (!) 46   Temp 98 F (36.7 C) (Oral)   Resp 13   Ht 6\' 3"  (1.905 m)   Wt 92.1 kg   SpO2 99%   BMI 25.37 kg/m   Physical Exam Vitals and nursing note reviewed.  Constitutional:      General: He is not in acute distress.    Appearance: He is well-developed and normal weight. He is not ill-appearing, toxic-appearing or diaphoretic.  HENT:     Head: Normocephalic.  Cardiovascular:     Rate and Rhythm: Normal rate and regular rhythm.     Heart sounds: Normal heart sounds.  Pulmonary:     Effort: Pulmonary effort is normal.     Breath sounds: Examination of the right-upper field reveals rales. Rales present. No decreased breath sounds, wheezing or rhonchi.  Chest:     Chest wall: No mass or tenderness.  Musculoskeletal:     Cervical back: Normal range of motion.  Skin:    General: Skin is warm and dry.  Neurological:     General: No focal deficit present.     Mental Status: He is alert and oriented to person, place, and time.  Psychiatric:        Mood and Affect: Mood normal. Mood is not anxious.        Behavior: Behavior normal.     ED Results / Procedures / Treatments   Labs (all labs ordered are listed, but only abnormal results are displayed) Labs Reviewed  COMPREHENSIVE METABOLIC PANEL - Abnormal; Notable for the following components:      Result Value   Glucose, Bld 106 (*)    Total Protein 6.4 (*)    All other components within normal limits  CBC WITH DIFFERENTIAL/PLATELET  TSH  TROPONIN I (HIGH SENSITIVITY)  TROPONIN I (HIGH SENSITIVITY)    EKG EKG Interpretation  Date/Time:  Tuesday Nov 02 2019 07:56:23 EDT Ventricular Rate:  55 PR Interval:    QRS Duration: 96 QT Interval:  432 QTC Calculation: 414 R Axis:   -36 Text Interpretation: Sinus rhythm Left axis deviation Low voltage, extremity and precordial leads Confirmed by Elnora Morrison [phone]) on 11/02/2019 8:01:02 AM   Radiology CT Chest  W Contrast  Result Date:  11/02/2019 CLINICAL DATA:  Chest pain EXAM: CT CHEST WITH CONTRAST TECHNIQUE: Multidetector CT imaging of the chest was performed during intravenous contrast administration. CONTRAST:  81mL OMNIPAQUE IOHEXOL 300 MG/ML  SOLN COMPARISON:  Chest radiograph Nov 02, 2019 FINDINGS: Cardiovascular: There is no appreciable thoracic aortic aneurysm or dissection. There are occasional foci of calcification in visualized great vessels. There are foci of aortic atherosclerosis. There are foci of coronary artery calcification at multiple sites. There is no pericardial effusion or pericardial thickening. There is no major vessel pulmonary embolus appreciable. Mediastinum/Nodes: Thyroid appears unremarkable. No evident thoracic adenopathy by size criteria. There are scattered subcentimeter mediastinal lymph nodes which do not meet size criteria for pathologic significance. No esophageal lesions are appreciable. Lungs/Pleura: No evident pneumothorax. There is underlying degree of centrilobular emphysematous change. There are bullae in the upper lobes toward the apices bilaterally and in the superior segment right lower lobe. There is mild scarring toward the apices bilaterally. There is no evident edema or airspace opacity. There is mild atelectatic change in the left lower lung region. There is a slight degree of lower lobe bronchiectatic change. No pleural effusions evident. On axial slice 57 series 4, there is a 1 mm nodular opacity in the anterior segment of the right upper lobe. On axial slice 55 series 4, there is a 2 mm nodular opacity in the posterior segment right upper lobe. On axial slice 38 series 4, there is a 2 mm nodular opacity in the posterior segment right upper lobe. Upper Abdomen: There is a cyst arising from the upper pole left kidney measuring 1.3 x 1.3 cm. There is a degree of hepatic steatosis. Visualized upper abdominal structures otherwise appear unremarkable. Musculoskeletal: No blastic or lytic bone  lesions. No evident chest wall lesions. IMPRESSION: 1. There is a degree of underlying emphysematous change. No pneumothorax. Slight lower lobe bronchiectatic change noted. No edema or airspace opacity. Mild left base atelectasis. Occasional 1-2 mm nodular opacities noted. No follow-up needed if patient is low-risk (and has no known or suspected primary neoplasm). Non-contrast chest CT can be considered in 12 months if patient is high-risk. This recommendation follows the consensus statement: Guidelines for Management of Incidental Pulmonary Nodules Detected on CT Images: From the Fleischner Society 2017; Radiology 2017; 284:228-243. 2.  No evident adenopathy. 3. No thoracic aortic aneurysm. There are foci of aortic atherosclerosis. There are foci of coronary artery calcification. 4.  Hepatic steatosis. Aortic Atherosclerosis (ICD10-I70.0) and Emphysema (ICD10-J43.9). Electronically Signed   By: Lowella Grip III M.D.   On: 11/02/2019 09:49   DG Chest Portable 1 View  Result Date: 11/02/2019 CLINICAL DATA:  Pt c/o left sided chest aching worsening with inhalation that radiates into his left upper back intermittently x3 months but worsening and becoming more frequent over the past 2-3 days. Hx GERD, smoker, no COVID test ordered at this time EXAM: PORTABLE CHEST 1 VIEW COMPARISON:  12/07/2018 FINDINGS: The cardiac silhouette is normal in size and configuration. No mediastinal or hilar masses. There is no evidence of adenopathy. Clear lungs.  No pleural effusion or pneumothorax. Skeletal structures are grossly intact. IMPRESSION: No active disease. Electronically Signed   By: Lajean Manes M.D.   On: 11/02/2019 08:32    Procedures Procedures (including critical care time)  Medications Ordered in ED Medications  fentaNYL (SUBLIMAZE) injection 50 mcg (50 mcg Intravenous Given 11/02/19 0822)  iohexol (OMNIPAQUE) 300 MG/ML solution 75 mL (75 mLs Intravenous Contrast Given 11/02/19 0928)  ED Course  I  have reviewed the triage vital signs and the nursing notes.  Pertinent labs & imaging results that were available during my care of the patient were reviewed by me and considered in my medical decision making (see chart for details).    MDM Rules/Calculators/A&P                      Patient looks mildly uncomfortable but is clinically stable on initial exam, story includes 1 month of potentially exertionally related chest tightness around the diaphragm and left side of the chest.  We will do initial chest work-up although think ACS is unlikely, do hear crackles to right side of lung upper lobe but chest x-ray is negative.  Lack of tachypnea and heart rate bordering on bradycardia make PE less likely but there is a suspicious story of reported 70 to 80 pound weight loss unintentionally over the last year making some sort of malignancy potential.  At this point labs are still pending, but EKG did not indicate ST changes.  8:50am: Recheck of patient, improved discomfort status post fentanyl, no shortness of breath, vital signs stable.  Notified we are hoping to get a chest CT to further characterize pain in that malignancy was up on her differential at this point.  Patient says he does have a regularly scheduled physical with his PCP next month and has been considering asking about cancer screening.  Chest CT negative for acute process, there are multiple small nodules noted on CT patient has been notified that he will likely require follow-up in 12 months and to discuss this with his PCP.  He has been notified of the emphysematous changes, he does have a Medicare wellness visit scheduled with his PCP early July and has been notified that he should call and try and schedule a hospital follow-up and potentially request cardiology follow-up prior to that visit.  Patient understands agrees with plan we have discussed return precautions   Final Clinical Impression(s) / ED Diagnoses Final diagnoses:   Nonspecific chest pain  Weight loss, non-intentional    Rx / DC Orders ED Discharge Orders    None       Sherene Sires, DO 11/02/19 1005    Elnora Morrison, MD 11/02/19 [phone]

## 2019-11-03 DIAGNOSIS — M1991 Primary osteoarthritis, unspecified site: Secondary | ICD-10-CM | POA: Diagnosis not present

## 2019-11-03 DIAGNOSIS — R0789 Other chest pain: Secondary | ICD-10-CM | POA: Diagnosis not present

## 2019-11-03 DIAGNOSIS — Z6826 Body mass index (BMI) 26.0-26.9, adult: Secondary | ICD-10-CM | POA: Diagnosis not present

## 2019-11-03 DIAGNOSIS — M47814 Spondylosis without myelopathy or radiculopathy, thoracic region: Secondary | ICD-10-CM | POA: Diagnosis not present

## 2019-11-03 DIAGNOSIS — M546 Pain in thoracic spine: Secondary | ICD-10-CM | POA: Diagnosis not present

## 2019-11-03 DIAGNOSIS — E063 Autoimmune thyroiditis: Secondary | ICD-10-CM | POA: Diagnosis not present

## 2019-11-08 DIAGNOSIS — E7849 Other hyperlipidemia: Secondary | ICD-10-CM | POA: Diagnosis not present

## 2019-11-08 DIAGNOSIS — E039 Hypothyroidism, unspecified: Secondary | ICD-10-CM | POA: Diagnosis not present

## 2019-11-15 DIAGNOSIS — G629 Polyneuropathy, unspecified: Secondary | ICD-10-CM | POA: Diagnosis not present

## 2019-11-15 DIAGNOSIS — Z79899 Other long term (current) drug therapy: Secondary | ICD-10-CM | POA: Diagnosis not present

## 2019-11-25 NOTE — Progress Notes (Signed)
CARDIOLOGY CONSULT NOTE       Patient ID: Frank Weber MRN: [id number] DOB/AGE: 1951-06-09 69 y.o.  Admit date: (Not on file) Referring Physician: [name] Primary Physician: [name], MD Primary Cardiologist: New Reason for Consultation: CAD  Active Problems:   * No active hospital problems. *   HPI:  69 y.o. referred by Dr [name] for CAD Complained of SSCP 2-3 weeks on office visit 11/05/19 Seen in [facility] ED for same. Pain on left side and band around entire front and back. Near diaphragm Smoker over 30 pack years. Pain not always related to exertion No associated pleurisy, palpitations, syncope or dyspnea. 2009 had no obstructive CAD at cath.  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 Arthritis    right knee  . Constipation   . Frequency of urination   . GERD (gastroesophageal reflux disease)   . Heart murmur    "slight"   . History of kidney stones   . History of melanoma excision    2012--  NECK/ HEAD  . History of squamous cell carcinoma excision    RIGHT EAR  . Hyperlipidemia   . Hypothyroidism   . IBS (irritable bowel syndrome)    states slight  . Lumbar stenosis    L5 - S1  . Migraines   . Peripheral neuropathy    legs and feet  . Right flank pain   . Right ureteral stone   . Urgency of urination   . Wears glasses     Family History  Problem Relation Age of Onset  . Colon cancer Other        Age of Onset-late 81's    Social History    Socioeconomic History  . Marital status: Married    Spouse name: Not on file  . Number of children: Not on file  . Years of education: Not on file  . Highest education level: Not on file  Occupational History  . Not on file  Tobacco Use  . Smoking status: Current Some Day Smoker    Packs/day: 0.00    Years: 30.00    Pack years: 0.00    Types: Cigars  . Smokeless tobacco: Never Used  . Tobacco comment: smokes an occasional cigar  Substance and Sexual Activity  . Alcohol use: Yes    Comment: seldom   . Drug use: No  . Sexual activity: Not on file  Other Topics Concern  . Not on file  Social History Narrative  . Not on file   Social Determinants of Health   Financial Resource Strain:   . Difficulty of Paying Living Expenses:   Food Insecurity:   . Worried About Charity fundraiser in the Last Year:   . Arboriculturist in the Last Year:   Transportation Needs:   . Film/video editor (Medical):   Marland Kitchen Lack of Transportation (Non-Medical):   Physical Activity:   . Days of  Exercise per Week:   . Minutes of Exercise per Session:   Stress:   . Feeling of Stress :   Social Connections:   . Frequency of Communication with Friends and Family:   . Frequency of Social Gatherings with Friends and Family:   . Attends Religious Services:   . Active Member of Clubs or Organizations:   . Attends Archivist Meetings:   Marland Kitchen Marital Status:   Intimate Partner Violence:   . Fear of Current or Ex-Partner:   . Emotionally Abused:   Marland Kitchen Physically Abused:   . Sexually Abused:     Past Surgical History:  Procedure Laterality Date  . CARDIAC CATHETERIZATION  07-08-2007  dr Tami Ribas    no significant CAD, preserved LVF, ef 50%//  30% pLAD  . CARDIOVASCULAR STRESS TEST  06-20-2011  dr croitoru   Low risk study/  normal perfusion scan showing attenuation artifact in the anterior region of myocardium with no ischemia , infarct or scar/  normal LV funciton and wall motion , ef 62%   . COLONOSCOPY N/A 02/17/2013   Procedure: COLONOSCOPY;  Surgeon: Rogene Houston, MD;  Location: [facility];  Service: Endoscopy;  Laterality: N/A;  225  . CYSTOSCOPY W/ URETERAL STENT PLACEMENT Right 06/18/2015   Procedure: CYSTOSCOPY WITH RIGHT RETROGRADE PYELOGRAM RIGHT URETERAL STENT PLACEMENT;  Surgeon: [name], MD;  Location: [facility];  Service: Urology;  Laterality: Right;  . CYSTOSCOPY/URETEROSCOPY/HOLMIUM LASER/STENT PLACEMENT Right 06/27/2015   Procedure: CYSTOSCOPY RIGHT URETEROSCOPY  STENT REMOVAL; STONE EXTRACTION WITH BASKET;  Surgeon: [name], MD;  Location: [facility];  Service: Urology;  Laterality: Right;  . HEAD & NECK SKIN LESION EXCISIONAL BIOPSY    . HOLMIUM LASER APPLICATION Right 6/75/9163   Procedure: HOLMIUM LASER LITHOTRIPSY ;  Surgeon: [name], MD;  Location: [facility];  Service: Urology;  Laterality: Right;  . I & D EXTREMITY Right 07/14/2018   Procedure: IRRIGATION AND DEBRIDEMENT RIGHT KNEE WITH CLOSURE;  Surgeon: Nicholes Stairs, MD;  Location: [facility];  Service: Orthopedics;  Laterality: Right;  . INGUINAL HERNIA REPAIR Left 10/21/2007  . KNEE ARTHROSCOPY W/ ACL RECONSTRUCTION Right 1992  . MENISCUS REPAIR Right    2019, dr Theda Sers   . SHOULDER ARTHROSCOPY WITH OPEN ROTATOR CUFF REPAIR Left 07/27/2014   Procedure: LEFT SHOULDER ARTHROSCOPY WITH MINI OPEN ROTATOR CUFF REPAIR;  Surgeon: [name], MD;  Location: [facility];  Service: Orthopedics;  Laterality: Left;  . SHOULDER ARTHROSCOPY WITH OPEN ROTATOR CUFF REPAIR Right 2006  . TONSILLECTOMY AND ADENOIDECTOMY  age 30  . TOTAL KNEE ARTHROPLASTY Right 07/10/2018   Procedure: TOTAL KNEE ARTHROPLASTY;  Surgeon: [name], MD;  Location: [facility];  Service: Orthopedics;  Laterality: Right;  . TRANSTHORACIC ECHOCARDIOGRAM  07-30-2007   normal LVF, ef >55%/  mild AR, MR , PR and TR/  mild AV sclerosis without stenosis/   . URETEROSCOPY Right 06/18/2015    Procedure: URETEROSCOPY;  Surgeon: [name], MD;  Location: [facility];  Service: Urology;  Laterality: Right;      Current Outpatient Medications:  .  acetaminophen-codeine (TYLENOL #3) 300-30 MG tablet, Take 1 tablet by mouth every 6 (six) hours as needed., Disp: , Rfl:  .  ALPRAZOLAM XR 1 MG 24 hr tablet, Take 1 mg by mouth at bedtime., Disp: , Rfl:  .  atorvastatin (LIPITOR) 20 MG tablet, Take 20 mg by mouth daily., Disp: , Rfl:  .  cyclobenzaprine (FLEXERIL) 10  MG tablet, Take 10 mg by mouth every 8 (eight) hours as needed for muscle spasms. , Disp: , Rfl:  .  gabapentin (NEURONTIN) 300 MG capsule, Take 900 mg by mouth 4 (four) times daily. , Disp: , Rfl:  .  levothyroxine (SYNTHROID, LEVOTHROID) 25 MCG tablet, Take 25 mcg by mouth daily before breakfast., Disp: , Rfl:  .  LORazepam (ATIVAN) 0.5 MG tablet, Take 1-1.5 mg by mouth at bedtime as needed for anxiety or sleep. , Disp: , Rfl:  .  methocarbamol (ROBAXIN) 500 MG tablet, Take 1 tablet (500 mg total) by mouth 4 (four) times daily., Disp: 50 tablet, Rfl: 1 .  Polyethyl Glycol-Propyl Glycol (SYSTANE ULTRA) 0.4-0.3 % SOLN, Place 2 drops into both eyes as needed (for dry eyes). , Disp: , Rfl:  .  Probiotic Product (TRUBIOTICS PO), Take 1 capsule by mouth at bedtime., Disp: , Rfl:  .  rizatriptan (MAXALT-MLT) 10 MG disintegrating tablet, Take 10 mg by mouth as needed (for migraines and may repeat once in 2 hours, if no relief). , Disp: , Rfl:     Physical Exam: Blood pressure 112/64, pulse (!) 56, height 6\' 3"  (1.905 m), weight 202 lb 12.8 oz (92 kg), SpO2 98 %.    Affect appropriate Healthy:  appears stated age 62: normal Neck supple with no adenopathy JVP normal no bruits no thyromegaly Lungs clear with no wheezing and good diaphragmatic motion Heart:  S1/S2 no murmur, no rub, gallop or click PMI normal Abdomen: benighn, BS positve, no tenderness, no AAA no bruit.  No HSM or HJR Distal pulses intact with no bruits No  edema Neuro non-focal Skin warm and dry No muscular weakness   Labs:   Lab Results  Component Value Date   WBC 5.3 11/02/2019   HGB 15.4 11/02/2019   HCT 48.3 11/02/2019   MCV 99.8 11/02/2019   PLT 191 11/02/2019   No results for input(s): NA, K, CL, CO2, BUN, CREATININE, CALCIUM, PROT, BILITOT, ALKPHOS, ALT, AST, GLUCOSE in the last 168 hours.  Invalid input(s): LABALBU Lab Results  Component Value Date   TROPONINI <0.03 08/01/2014   No results found for: CHOL No results found for: HDL No results found for: LDLCALC No results found for: TRIG No results found for: CHOLHDL No results found for: LDLDIRECT    Radiology: CT Chest W Contrast  Result Date: 11/02/2019 CLINICAL DATA:  Chest pain EXAM: CT CHEST WITH CONTRAST TECHNIQUE: Multidetector CT imaging of the chest was performed during intravenous contrast administration. CONTRAST:  31mL OMNIPAQUE IOHEXOL 300 MG/ML  SOLN COMPARISON:  Chest radiograph Nov 02, 2019 FINDINGS: Cardiovascular: There is no appreciable thoracic aortic aneurysm or dissection. There are occasional foci of calcification in visualized great vessels. There are foci of aortic atherosclerosis. There are foci of coronary artery calcification at multiple sites. There is no pericardial effusion or pericardial thickening. There is no major vessel pulmonary embolus appreciable. Mediastinum/Nodes: Thyroid appears unremarkable. No evident thoracic adenopathy by size criteria. There are scattered subcentimeter mediastinal lymph nodes which do not meet size criteria for pathologic significance. No esophageal lesions are appreciable. Lungs/Pleura: No evident pneumothorax. There is underlying degree of centrilobular emphysematous change. There are bullae in the upper lobes toward the apices bilaterally and in the superior segment right lower lobe. There is mild scarring toward the apices bilaterally. There is no evident edema or airspace opacity. There is mild atelectatic  change in the left lower lung region. There is a slight degree of lower lobe bronchiectatic  change. No pleural effusions evident. On axial slice 57 series 4, there is a 1 mm nodular opacity in the anterior segment of the right upper lobe. On axial slice 55 series 4, there is a 2 mm nodular opacity in the posterior segment right upper lobe. On axial slice 38 series 4, there is a 2 mm nodular opacity in the posterior segment right upper lobe. Upper Abdomen: There is a cyst arising from the upper pole left kidney measuring 1.3 x 1.3 cm. There is a degree of hepatic steatosis. Visualized upper abdominal structures otherwise appear unremarkable. Musculoskeletal: No blastic or lytic bone lesions. No evident chest wall lesions. IMPRESSION: 1. There is a degree of underlying emphysematous change. No pneumothorax. Slight lower lobe bronchiectatic change noted. No edema or airspace opacity. Mild left base atelectasis. Occasional 1-2 mm nodular opacities noted. No follow-up needed if patient is low-risk (and has no known or suspected primary neoplasm). Non-contrast chest CT can be considered in 12 months if patient is high-risk. This recommendation follows the consensus statement: Guidelines for Management of Incidental Pulmonary Nodules Detected on CT Images: From the Fleischner Society 2017; Radiology 2017; 284:228-243. 2.  No evident adenopathy. 3. No thoracic aortic aneurysm. There are foci of aortic atherosclerosis. There are foci of coronary artery calcification. 4.  Hepatic steatosis. Aortic Atherosclerosis (ICD10-I70.0) and Emphysema (ICD10-J43.9). Electronically Signed   By: Lowella Grip III M.D.   On: 11/02/2019 09:49   DG Chest Portable 1 View  Result Date: 11/02/2019 CLINICAL DATA:  Pt c/o left sided chest aching worsening with inhalation that radiates into his left upper back intermittently x3 months but worsening and becoming more frequent over the past 2-3 days. Hx GERD, smoker, no COVID test ordered at  this time EXAM: PORTABLE CHEST 1 VIEW COMPARISON:  12/07/2018 FINDINGS: The cardiac silhouette is normal in size and configuration. No mediastinal or hilar masses. There is no evidence of adenopathy. Clear lungs.  No pleural effusion or pneumothorax. Skeletal structures are grossly intact. IMPRESSION: No active disease. Electronically Signed   By: Lajean Manes M.D.   On: 11/02/2019 08:32    EKG: SR rate 55 LAD no acute ST changes    ASSESSMENT AND PLAN:   1. Chest Pain:  Normal cath in 2016 atypical pain in smoker will risk stratify with cardiac CTA  Only needs 50 mg oral lopressor 2 hours before scan as resting HR today 56  2. HLD:  On statin labs with primary  3. Murmur: 2/6 SEM sounds benign echo to further assess  F/U with cardiology PRN if above tests ok    Signed: Jenkins Rouge 11/30/2019, 11:16 AM

## 2019-11-30 ENCOUNTER — Encounter: Payer: Self-pay | Admitting: Cardiovascular Disease

## 2019-11-30 ENCOUNTER — Ambulatory Visit (INDEPENDENT_AMBULATORY_CARE_PROVIDER_SITE_OTHER): Payer: Medicare Other | Admitting: Cardiovascular Disease

## 2019-11-30 ENCOUNTER — Other Ambulatory Visit: Payer: Self-pay

## 2019-11-30 VITALS — BP 112/64 | HR 56 | Ht 75.0 in | Wt 202.8 lb

## 2019-11-30 DIAGNOSIS — R079 Chest pain, unspecified: Secondary | ICD-10-CM | POA: Diagnosis not present

## 2019-11-30 DIAGNOSIS — R011 Cardiac murmur, unspecified: Secondary | ICD-10-CM | POA: Diagnosis not present

## 2019-11-30 DIAGNOSIS — Z01818 Encounter for other preprocedural examination: Secondary | ICD-10-CM

## 2019-11-30 DIAGNOSIS — I209 Angina pectoris, unspecified: Secondary | ICD-10-CM

## 2019-11-30 MED ORDER — METOPROLOL TARTRATE 50 MG PO TABS
ORAL_TABLET | ORAL | 0 refills | Status: DC
Start: 2019-11-30 — End: 2019-12-24

## 2019-11-30 NOTE — Addendum Note (Signed)
Addended by: Barbarann Ehlers A on: 11/30/2019 11:26 AM   Modules accepted: Orders

## 2019-11-30 NOTE — Patient Instructions (Signed)
Medication Instructions:  Your physician recommends that you continue on your current medications as directed. Please refer to the Current Medication list given to you today.   Pick up Lopressor 50 mg from [place] and take 2 hours before cardiac CT  *If you need a refill on your cardiac medications before your next appointment, please call your pharmacy*   Lab Work: None today If you have labs (blood work) drawn today and your tests are completely normal, you will receive your results only by:  [place] (if you have MyChart) OR  A paper copy in the mail If you have any lab test that is abnormal or we need to change your treatment, we will call you to review the results.   Testing/Procedures: Your physician has requested that you have an echocardiogram. Echocardiography is a painless test that uses sound waves to create images of your heart. It provides your doctor with information about the size and shape of your heart and how well your hearts chambers and valves are working. This procedure takes approximately one hour. There are no restrictions for this procedure.  Your physician has requested that you have cardiac CT. Cardiac computed tomography (CT) is a painless test that uses an x-ray machine to take clear, detailed pictures of your heart. For further information please visit [email or web address].[email or web address]. Please follow instruction sheet as given.     Follow-Up: At [facility], you and your health needs are our priority.  As part of our continuing mission to provide you with exceptional heart care, we have created designated Provider Care Teams.  These Care Teams include your primary Cardiologist (physician) and Advanced Practice Providers (APPs -  Physician Assistants and Nurse Practitioners) who all work together to provide you with the care you need, when you need it.  We recommend signing up for the patient portal called "MyChart".  Sign up information is provided on  this After Visit Summary.  MyChart is used to connect with patients for Virtual Visits (Telemedicine).  Patients are able to view lab/test results, encounter notes, upcoming appointments, etc.  Non-urgent messages can be sent to your provider as well.   To learn more about what you can do with MyChart, go to [email or web address].    Your next appointment:  As needed with Dr.Nishan. We will call you with test results.      Thank you for choosing [facility] !

## 2019-12-09 DIAGNOSIS — Z6825 Body mass index (BMI) 25.0-25.9, adult: Secondary | ICD-10-CM | POA: Diagnosis not present

## 2019-12-09 DIAGNOSIS — Z1389 Encounter for screening for other disorder: Secondary | ICD-10-CM | POA: Diagnosis not present

## 2019-12-09 DIAGNOSIS — E782 Mixed hyperlipidemia: Secondary | ICD-10-CM | POA: Diagnosis not present

## 2019-12-09 DIAGNOSIS — Z125 Encounter for screening for malignant neoplasm of prostate: Secondary | ICD-10-CM | POA: Diagnosis not present

## 2019-12-09 DIAGNOSIS — Z0000 Encounter for general adult medical examination without abnormal findings: Secondary | ICD-10-CM | POA: Diagnosis not present

## 2019-12-15 ENCOUNTER — Ambulatory Visit (HOSPITAL_COMMUNITY)
Admission: RE | Admit: 2019-12-15 | Discharge: 2019-12-15 | Disposition: A | Discharge status: Home / Self Care | Payer: Medicare Other | Source: Ambulatory Visit | Attending: Cardiovascular Disease | Admitting: Cardiovascular Disease

## 2019-12-15 ENCOUNTER — Other Ambulatory Visit: Payer: Self-pay

## 2019-12-15 DIAGNOSIS — R011 Cardiac murmur, unspecified: Secondary | ICD-10-CM | POA: Diagnosis not present

## 2019-12-15 NOTE — Progress Notes (Signed)
*  PRELIMINARY RESULTS* Echocardiogram 2D Echocardiogram has been performed.  Frank Weber 12/15/2019, 11:31 AM

## 2019-12-15 NOTE — Addendum Note (Signed)
Addended by: Barbarann Ehlers A on: 12/15/2019 03:22 PM   Modules accepted: Orders

## 2019-12-16 ENCOUNTER — Other Ambulatory Visit: Payer: Self-pay

## 2019-12-16 ENCOUNTER — Other Ambulatory Visit (HOSPITAL_COMMUNITY)
Admission: RE | Admit: 2019-12-16 | Discharge: 2019-12-16 | Disposition: A | Discharge status: Home / Self Care | Payer: Medicare Other | Source: Ambulatory Visit | Attending: Cardiovascular Disease | Admitting: Cardiovascular Disease

## 2019-12-16 DIAGNOSIS — Z01818 Encounter for other preprocedural examination: Secondary | ICD-10-CM | POA: Diagnosis present

## 2019-12-16 LAB — BASIC METABOLIC PANEL
Anion gap: 10 (ref 5–15)
BUN: 15 mg/dL (ref 8–23)
CO2: 28 mmol/L (ref 22–32)
Calcium: 9.7 mg/dL (ref 8.9–10.3)
Chloride: 103 mmol/L (ref 98–111)
Creatinine, Ser: 1.17 mg/dL (ref 0.61–1.24)
GFR calc Af Amer: >60 mL/min (ref >60)
GFR calc non Af Amer: >60 mL/min (ref >60)
Glucose, Bld: 108 mg/dL — ABNORMAL HIGH (ref 70–99)
Potassium: 4.3 mmol/L (ref 3.5–5.1)
Sodium: 141 mmol/L (ref 135–145)

## 2019-12-21 ENCOUNTER — Telehealth (HOSPITAL_COMMUNITY): Payer: Self-pay | Admitting: *Deleted

## 2019-12-21 NOTE — Telephone Encounter (Signed)
Reaching out to patient to offer assistance regarding upcoming cardiac imaging study; pt verbalizes understanding of appt date/time, parking situation and where to check in, pre-test NPO status and medications ordered, and verified current allergies; name and call back number provided for further questions should they arise  Taneya Conkel Tai RN Navigator Cardiac Imaging [facility] [phone] office [phone] cell 

## 2019-12-23 ENCOUNTER — Other Ambulatory Visit: Payer: Self-pay

## 2019-12-23 ENCOUNTER — Ambulatory Visit
Admission: RE | Admit: 2019-12-23 | Discharge: 2019-12-23 | Disposition: A | Discharge status: Home / Self Care | Payer: Medicare Other | Source: Ambulatory Visit | Attending: Cardiovascular Disease | Admitting: Cardiovascular Disease

## 2019-12-23 DIAGNOSIS — Z01818 Encounter for other preprocedural examination: Secondary | ICD-10-CM | POA: Diagnosis not present

## 2019-12-23 DIAGNOSIS — I209 Angina pectoris, unspecified: Secondary | ICD-10-CM | POA: Insufficient documentation

## 2019-12-23 DIAGNOSIS — R079 Chest pain, unspecified: Secondary | ICD-10-CM | POA: Insufficient documentation

## 2019-12-23 MED ORDER — IOHEXOL 350 MG/ML SOLN
100.0000 mL | Freq: Once | INTRAVENOUS | Status: AC | PRN
  Administered 2019-12-23: 75 mL via INTRAVENOUS

## 2019-12-23 MED ORDER — NITROGLYCERIN 0.4 MG SL SUBL
0.4000 mg | SUBLINGUAL_TABLET | Freq: Once | SUBLINGUAL | Status: AC
  Administered 2019-12-23: 0.4 mg via SUBLINGUAL

## 2019-12-23 NOTE — Progress Notes (Signed)
Patient tolerated procedure well. Ambulate w/o difficulty. Sitting in chair drinking. No needs. All questions answered. ABC intact. Discharge from procedure area w/o issues. 

## 2019-12-24 ENCOUNTER — Telehealth: Payer: Self-pay

## 2019-12-24 DIAGNOSIS — I2510 Atherosclerotic heart disease of native coronary artery without angina pectoris: Secondary | ICD-10-CM | POA: Diagnosis not present

## 2019-12-24 MED ORDER — ASPIRIN EC 81 MG PO TBEC: 81.0000 mg | DELAYED_RELEASE_TABLET | Freq: Every day | ORAL | 3 refills | Status: AC

## 2019-12-24 MED ORDER — NITROGLYCERIN 0.4 MG SL SUBL
0.4000 mg | SUBLINGUAL_TABLET | SUBLINGUAL | 3 refills | Status: DC | PRN
Start: 2019-12-24 — End: 2021-07-18

## 2019-12-24 NOTE — Telephone Encounter (Signed)
Ok that's fine

## 2019-12-24 NOTE — Telephone Encounter (Signed)
Patient does not take beta blocker. He was told not to take lopressor 50 mg for his CT as his HR was 50. He states his BP runs 12'J systolic.

## 2019-12-24 NOTE — Telephone Encounter (Signed)
-----   Message from Josue Hector, MD sent at 12/24/2019  8:19 AM EDT ----- He has moderate LAD disease and high calcium score continue ASA/beta blocker and statin with target LDL <70 make sure he has SL nitro f/u with me in 3 months

## 2020-01-13 DIAGNOSIS — L821 Other seborrheic keratosis: Secondary | ICD-10-CM | POA: Diagnosis not present

## 2020-01-13 DIAGNOSIS — D1801 Hemangioma of skin and subcutaneous tissue: Secondary | ICD-10-CM | POA: Diagnosis not present

## 2020-01-13 DIAGNOSIS — D692 Other nonthrombocytopenic purpura: Secondary | ICD-10-CM | POA: Diagnosis not present

## 2020-01-13 DIAGNOSIS — L814 Other melanin hyperpigmentation: Secondary | ICD-10-CM | POA: Diagnosis not present

## 2020-01-13 DIAGNOSIS — Z85828 Personal history of other malignant neoplasm of skin: Secondary | ICD-10-CM | POA: Diagnosis not present

## 2020-01-13 DIAGNOSIS — L820 Inflamed seborrheic keratosis: Secondary | ICD-10-CM | POA: Diagnosis not present

## 2020-01-13 DIAGNOSIS — L819 Disorder of pigmentation, unspecified: Secondary | ICD-10-CM | POA: Diagnosis not present

## 2020-01-31 IMAGING — DX DG FEMUR 2+V PORT*R*
4 series · 4 of 4 positions shown · non-contrast
Comparison: None.

CLINICAL DATA: Status post right knee replacement 07/10/2018. Right
upper leg pain. Initial encounter.

EXAM:
RIGHT FEMUR PORTABLE 2 VIEW

[femur ap (1 of 2)]
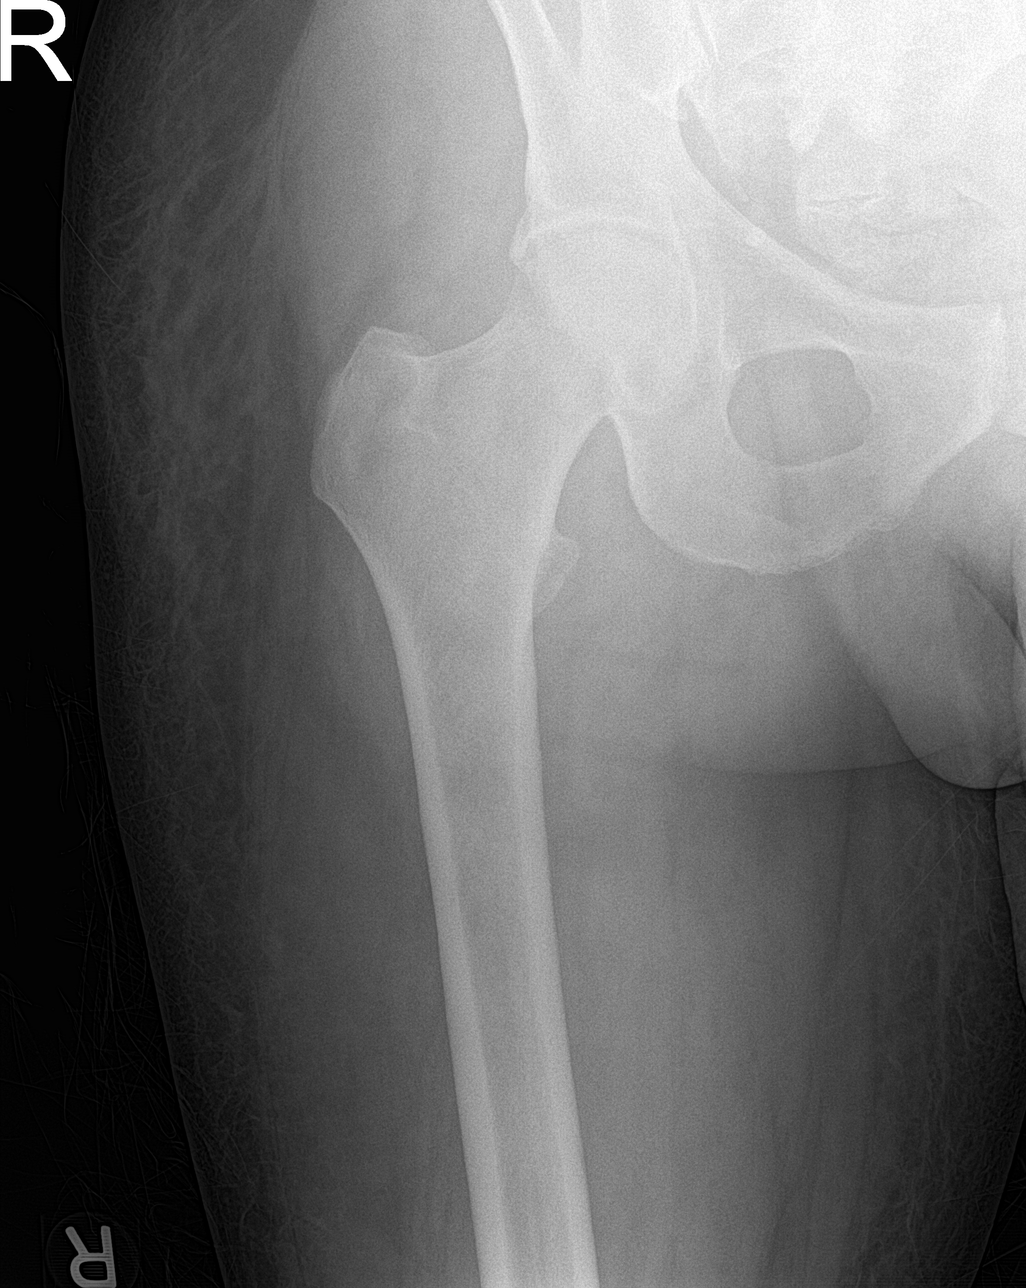

[femur ap (2 of 2)]
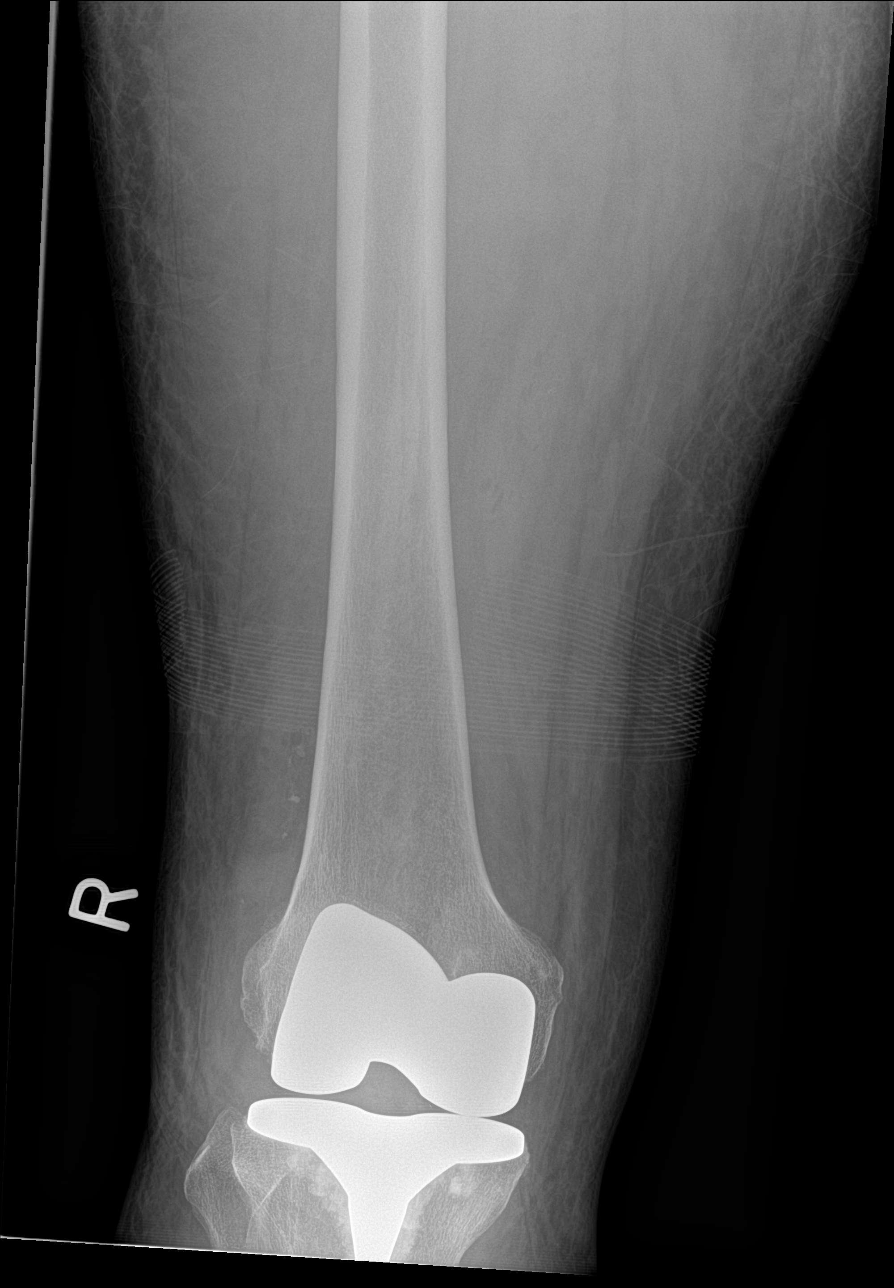

[femur lat (1 of 2)]
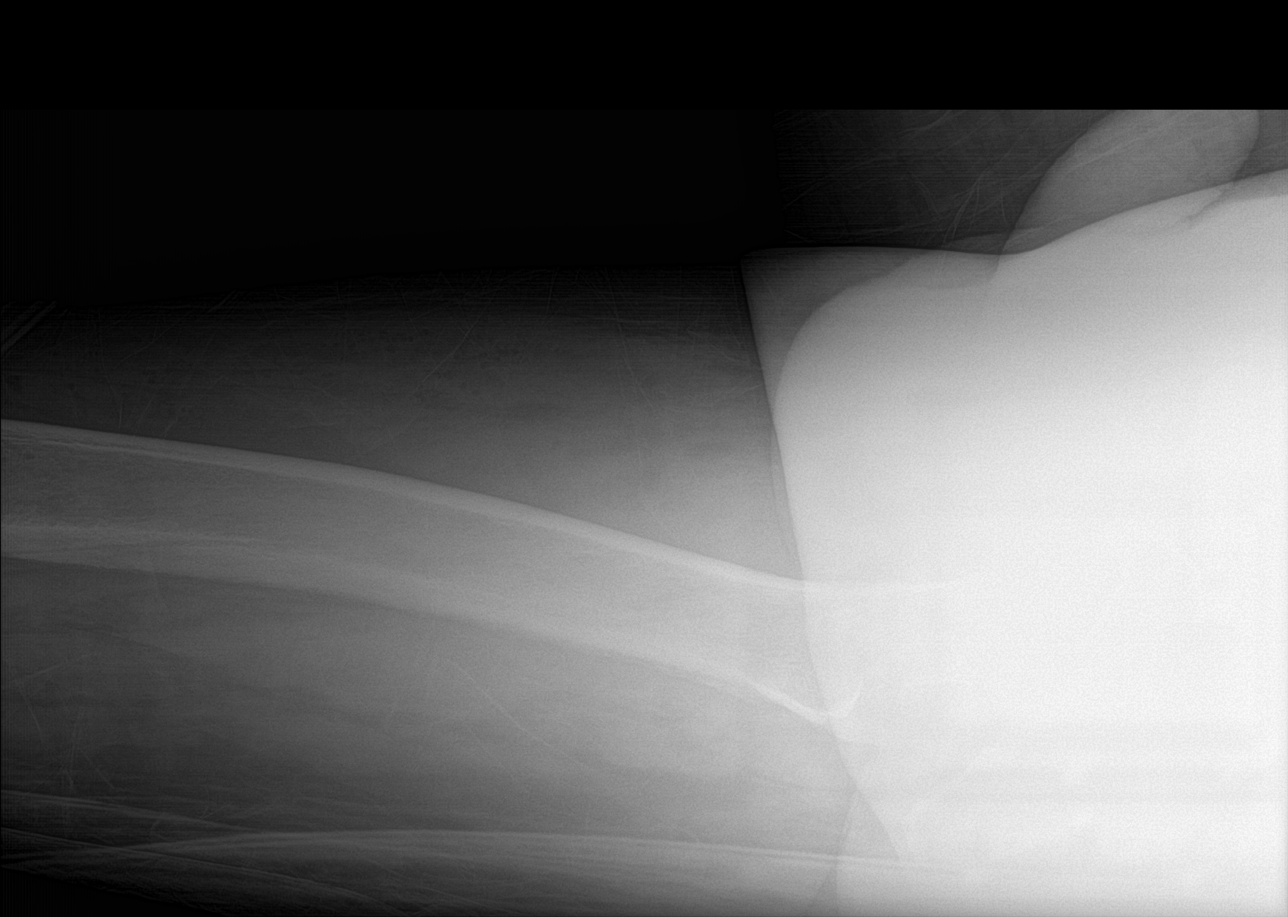

[femur lat (2 of 2)]
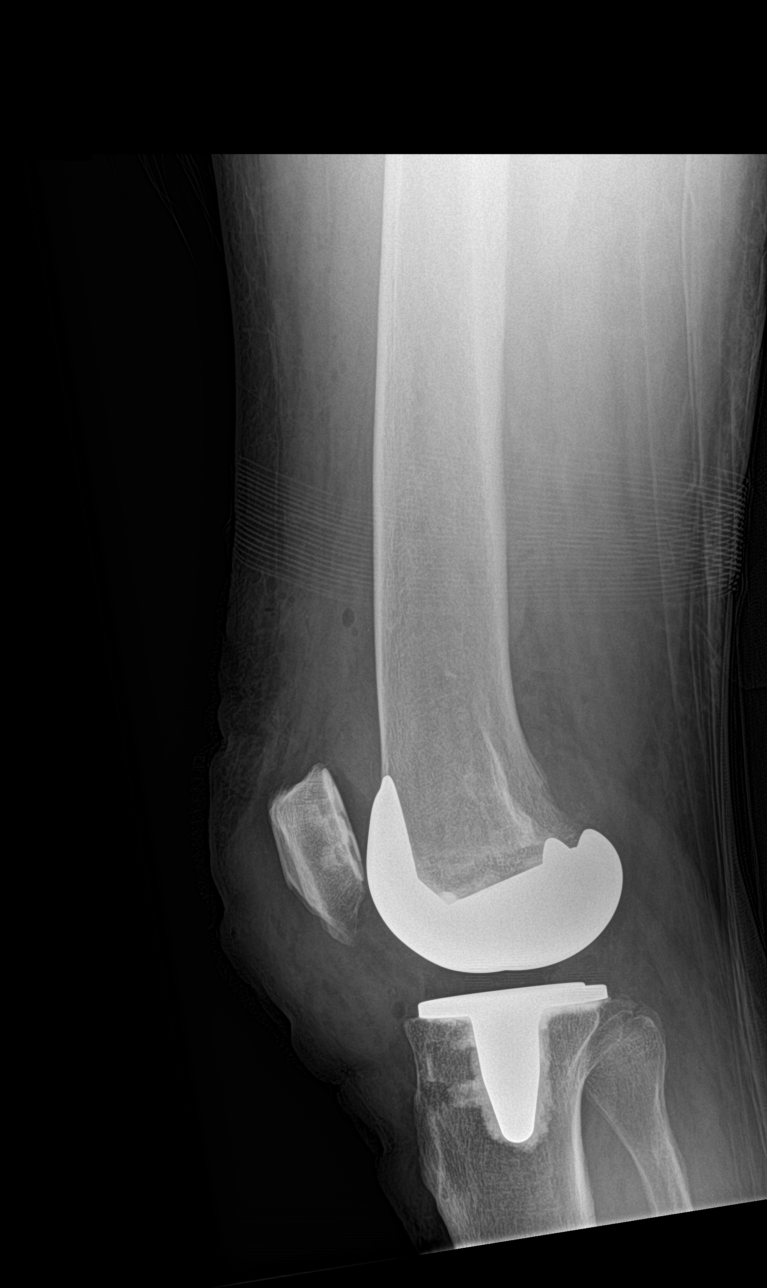

[4 of 4 positions shown; findings below may reference images not displayed]

FINDINGS: There is no evidence of fracture or other focal bone lesions. Soft
tissues swelling about the knee and a small amount of gas in the
soft tissues are most consistent with postoperative change.
IMPRESSION: No acute abnormality.

## 2020-02-04 DIAGNOSIS — Z6825 Body mass index (BMI) 25.0-25.9, adult: Secondary | ICD-10-CM | POA: Diagnosis not present

## 2020-02-04 DIAGNOSIS — R1030 Lower abdominal pain, unspecified: Secondary | ICD-10-CM | POA: Diagnosis not present

## 2020-02-04 DIAGNOSIS — E663 Overweight: Secondary | ICD-10-CM | POA: Diagnosis not present

## 2020-02-05 ENCOUNTER — Emergency Department (HOSPITAL_COMMUNITY): Payer: Medicare Other

## 2020-02-05 ENCOUNTER — Other Ambulatory Visit: Payer: Self-pay

## 2020-02-05 ENCOUNTER — Encounter (HOSPITAL_COMMUNITY): Payer: Self-pay | Admitting: Emergency Medicine

## 2020-02-05 ENCOUNTER — Emergency Department (HOSPITAL_COMMUNITY)
Admission: EM | Admit: 2020-02-05 | Discharge: 2020-02-05 | Disposition: A | Discharge status: Home / Self Care | Payer: Medicare Other | Attending: Emergency Medicine | Admitting: Emergency Medicine

## 2020-02-05 DIAGNOSIS — Z79899 Other long term (current) drug therapy: Secondary | ICD-10-CM | POA: Insufficient documentation

## 2020-02-05 DIAGNOSIS — E039 Hypothyroidism, unspecified: Secondary | ICD-10-CM | POA: Diagnosis not present

## 2020-02-05 DIAGNOSIS — Z96651 Presence of right artificial knee joint: Secondary | ICD-10-CM | POA: Insufficient documentation

## 2020-02-05 DIAGNOSIS — K5900 Constipation, unspecified: Secondary | ICD-10-CM | POA: Insufficient documentation

## 2020-02-05 DIAGNOSIS — Z20822 Contact with and (suspected) exposure to covid-19: Secondary | ICD-10-CM | POA: Diagnosis not present

## 2020-02-05 DIAGNOSIS — F17290 Nicotine dependence, other tobacco product, uncomplicated: Secondary | ICD-10-CM | POA: Insufficient documentation

## 2020-02-05 DIAGNOSIS — Z7982 Long term (current) use of aspirin: Secondary | ICD-10-CM | POA: Insufficient documentation

## 2020-02-05 DIAGNOSIS — K5792 Diverticulitis of intestine, part unspecified, without perforation or abscess without bleeding: Secondary | ICD-10-CM

## 2020-02-05 DIAGNOSIS — Z79890 Hormone replacement therapy (postmenopausal): Secondary | ICD-10-CM | POA: Insufficient documentation

## 2020-02-05 DIAGNOSIS — K5732 Diverticulitis of large intestine without perforation or abscess without bleeding: Secondary | ICD-10-CM | POA: Insufficient documentation

## 2020-02-05 DIAGNOSIS — R109 Unspecified abdominal pain: Secondary | ICD-10-CM | POA: Diagnosis present

## 2020-02-05 LAB — COMPREHENSIVE METABOLIC PANEL
ALT: 13 U/L (ref 0–44)
AST: 14 U/L — ABNORMAL LOW (ref 15–41)
Albumin: 4 g/dL (ref 3.5–5.0)
Alkaline Phosphatase: 75 U/L (ref 38–126)
Anion gap: 7 (ref 5–15)
BUN: 15 mg/dL (ref 8–23)
CO2: 28 mmol/L (ref 22–32)
Calcium: 9.6 mg/dL (ref 8.9–10.3)
Chloride: 101 mmol/L (ref 98–111)
Creatinine, Ser: 1.18 mg/dL (ref 0.61–1.24)
GFR calc Af Amer: >60 mL/min (ref >60)
GFR calc non Af Amer: >60 mL/min (ref >60)
Glucose, Bld: 99 mg/dL (ref 70–99)
Potassium: 4.1 mmol/L (ref 3.5–5.1)
Sodium: 136 mmol/L (ref 135–145)
Total Bilirubin: 1 mg/dL (ref 0.3–1.2)
Total Protein: 6.5 g/dL (ref 6.5–8.1)

## 2020-02-05 LAB — CBC WITH DIFFERENTIAL/PLATELET
Abs Immature Granulocytes: 0.02 10*3/uL (ref 0.00–0.07)
Basophils Absolute: 0 10*3/uL (ref 0.0–0.1)
Basophils Relative: 1 %
Eosinophils Absolute: 0.2 10*3/uL (ref 0.0–0.5)
Eosinophils Relative: 3 %
HCT: 47.7 % (ref 39.0–52.0)
Hemoglobin: 15.6 g/dL (ref 13.0–17.0)
Immature Granulocytes: 0 %
Lymphocytes Relative: 17 %
Lymphs Abs: 1.3 10*3/uL (ref 0.7–4.0)
MCH: 32.2 pg (ref 26.0–34.0)
MCHC: 32.7 g/dL (ref 30.0–36.0)
MCV: 98.4 fL (ref 80.0–100.0)
Monocytes Absolute: 0.8 10*3/uL (ref 0.1–1.0)
Monocytes Relative: 11 %
Neutro Abs: 5.1 10*3/uL (ref 1.7–7.7)
Neutrophils Relative %: 68 %
Platelets: 189 10*3/uL (ref 150–400)
RBC: 4.85 MIL/uL (ref 4.22–5.81)
RDW: 12.9 % (ref 11.5–15.5)
WBC: 7.5 10*3/uL (ref 4.0–10.5)
nRBC: 0 % (ref 0.0–0.2)

## 2020-02-05 LAB — SARS CORONAVIRUS 2 BY RT PCR (HOSPITAL ORDER, PERFORMED IN ~~LOC~~ HOSPITAL LAB): SARS Coronavirus 2: NEGATIVE

## 2020-02-05 MED ORDER — HYDROCODONE-ACETAMINOPHEN 5-325 MG PO TABS: 1.0000 | ORAL_TABLET | Freq: Four times a day (QID) | ORAL | 0 refills | Status: DC | PRN

## 2020-02-05 MED ORDER — AMOXICILLIN-POT CLAVULANATE 875-125 MG PO TABS
1.0000 | ORAL_TABLET | Freq: Two times a day (BID) | ORAL | 0 refills | Status: DC
Start: 2020-02-05 — End: 2020-05-02

## 2020-02-05 MED ORDER — FENTANYL CITRATE (PF) 100 MCG/2ML IJ SOLN
50.0000 ug | Freq: Once | INTRAMUSCULAR | Status: AC
  Administered 2020-02-05: 50 ug via INTRAVENOUS
  Filled 2020-02-05: qty 2

## 2020-02-05 MED ORDER — IOHEXOL 300 MG/ML  SOLN
100.0000 mL | Freq: Once | INTRAMUSCULAR | Status: AC | PRN
  Administered 2020-02-05: 100 mL via INTRAVENOUS

## 2020-02-05 NOTE — ED Triage Notes (Signed)
Patient c/o lower abd pain since Wednesday that is progressively getting worse. Patient states vomited on Wednesday x3 afte having a BM. Denies any nausea or vomiting since. Denies any diarrhea, fevers, or urinary symptoms. Denies any blood in stool. Per patient took laxative yesterday with no BM. Last BM x2 days ago. Patient states pain mostly on left side. Patient has left inguinal hernia. Per patient hx of diverticulosis has refferal to urologist for hernia and GI doctor.

## 2020-02-05 NOTE — ED Notes (Signed)
Patient still rates pain at a 9/10. EDP aware-no orders given at this time.

## 2020-02-05 NOTE — ED Provider Notes (Signed)
[facility] EMERGENCY DEPARTMENT Provider Note   CSN: [id number] Arrival date & time: 02/05/20  6010     History Chief Complaint  Patient presents with  . Abdominal Pain    Frank Weber is a 69 y.o. male.  HPI Patient presents with left lower abdominal pain.  Began Wednesday with today being Saturday.  Has been getting worse.  States Wednesday it was very severe.  Came on somewhat suddenly.  No dysuria.  States he vomited on Wednesday.  States that her 2 bowel movements.  No diarrhea.  Has had some mild constipation.  States he has had a left inguinal hernia previously repaired by urology.  States he has a known right inguinal hernia that shows up when he stands up.  States his primary care doctor referred him to urology and GI.  Has seen Dr. Laural Golden previously. Has also lost 20 pounds of weight without trying over the last few months.    Past Medical History:  Diagnosis Date  . Arthritis    right knee  . Constipation   . Frequency of urination   . GERD (gastroesophageal reflux disease)   . Heart murmur    "slight"   . History of kidney stones   . History of melanoma excision    2012--  NECK/ HEAD  . History of squamous cell carcinoma excision    RIGHT EAR  . Hyperlipidemia   . Hypothyroidism   . IBS (irritable bowel syndrome)    states slight  . Lumbar stenosis    L5 - S1  . Migraines   . Peripheral neuropathy    legs and feet  . Right flank pain   . Right ureteral stone   . Urgency of urination   . Wears glasses     Patient Active Problem List   Diagnosis Date Noted  . Lung nodule, multiple 11/02/2019  . Open wound of right knee 07/14/2018  . S/P knee replacement 07/10/2018  . Right ureteral stone 06/18/2015  . Unspecified hereditary and idiopathic peripheral neuropathy 01/27/2013  . IBS (irritable bowel syndrome) 01/27/2013  . GERD (gastroesophageal reflux disease) 01/27/2013  . History of migraine headaches 01/27/2013    Past Surgical History:   Procedure Laterality Date  . CARDIAC CATHETERIZATION  07-08-2007  dr Tami Ribas    no significant CAD, preserved LVF, ef 50%//  30% pLAD  . CARDIOVASCULAR STRESS TEST  06-20-2011  dr croitoru   Low risk study/  normal perfusion scan showing attenuation artifact in the anterior region of myocardium with no ischemia , infarct or scar/  normal LV funciton and wall motion , ef 62%  . COLONOSCOPY N/A 02/17/2013   Procedure: COLONOSCOPY;  Surgeon: Rogene Houston, MD;  [facility];  Service: Endoscopy;  Laterality: N/A;  225  . CYSTOSCOPY W/ URETERAL STENT PLACEMENT Right 06/18/2015   Procedure: CYSTOSCOPY WITH RIGHT RETROGRADE PYELOGRAM RIGHT URETERAL STENT PLACEMENT;  Surgeon: [name], MD;  Location: [facility];  Service: Urology;  Laterality: Right;  . CYSTOSCOPY/URETEROSCOPY/HOLMIUM LASER/STENT PLACEMENT Right 06/27/2015   Procedure: CYSTOSCOPY RIGHT URETEROSCOPY  STENT REMOVAL; STONE EXTRACTION WITH BASKET;  Surgeon: [name], MD;  Location: [facility];  Service: Urology;  Laterality: Right;  . HEAD & NECK SKIN LESION EXCISIONAL BIOPSY    . HOLMIUM LASER APPLICATION Right 9/32/3557   Procedure: HOLMIUM LASER LITHOTRIPSY ;  Surgeon: [name], MD;  Location: [facility];  Service: Urology;  Laterality: Right;  . I & D EXTREMITY Right  07/14/2018   Procedure: IRRIGATION AND DEBRIDEMENT RIGHT KNEE WITH CLOSURE;  Surgeon: Nicholes Stairs, MD;  Location: [place];  Service: Orthopedics;  Laterality: Right;  . INGUINAL HERNIA REPAIR Left 10/21/2007  . KNEE ARTHROSCOPY W/ ACL RECONSTRUCTION Right 1992  . MENISCUS REPAIR Right    2019, dr Theda Sers   . SHOULDER ARTHROSCOPY WITH OPEN ROTATOR CUFF REPAIR Left 07/27/2014   Procedure: LEFT SHOULDER ARTHROSCOPY WITH MINI OPEN ROTATOR CUFF REPAIR;  Surgeon: [name], MD;  Location: [place];  Service: Orthopedics;  Laterality: Left;  . SHOULDER ARTHROSCOPY WITH OPEN ROTATOR CUFF REPAIR Right 2006  .  TONSILLECTOMY AND ADENOIDECTOMY  age 85  . TOTAL KNEE ARTHROPLASTY Right 07/10/2018   Procedure: TOTAL KNEE ARTHROPLASTY;  Surgeon: [name], MD;  [facility];  Service: Orthopedics;  Laterality: Right;  . TRANSTHORACIC ECHOCARDIOGRAM  07-30-2007   normal LVF, ef >55%/  mild AR, MR , PR and TR/  mild AV sclerosis without stenosis/   . URETEROSCOPY Right 06/18/2015   Procedure: URETEROSCOPY;  Surgeon: [name], MD;  [facility];  Service: Urology;  Laterality: Right;       Family History  Problem Relation Age of Onset  . Colon cancer Other        Age of Onset-late 42's    Social History   Tobacco Use  . Smoking status: Current Some Day Smoker    Packs/day: 0.00    Years: 30.00    Pack years: 0.00    Types: Cigars  . Smokeless tobacco: Never Used  . Tobacco comment: smokes an occasional cigar  Vaping Use  . Vaping Use: Never used  Substance Use Topics  . Alcohol use: Yes    Comment: seldom   . Drug use: No    Home Medications Prior to Admission medications   Medication Sig Start Date End Date Taking? Authorizing Provider  acetaminophen-codeine (TYLENOL #3) 300-30 MG tablet Take 1 tablet by mouth every 6 (six) hours as needed. 11/29/19   [provider]  ALPRAZOLAM XR 1 MG 24 hr tablet Take 1 mg by mouth at bedtime. 11/17/19   [provider]  amoxicillin-clavulanate (AUGMENTIN) 875-125 MG tablet Take 1 tablet by mouth every 12 (twelve) hours. 02/05/20   Davonna Belling, MD  aspirin EC 81 MG tablet Take 1 tablet (81 mg total) by mouth daily. Swallow whole. 12/24/19   Josue Hector, MD  atorvastatin (LIPITOR) 20 MG tablet Take 20 mg by mouth daily. 11/09/19   [provider]  cyclobenzaprine (FLEXERIL) 10 MG tablet Take 10 mg by mouth every 8 (eight) hours as needed for muscle spasms.  06/13/18   [provider]  gabapentin (NEURONTIN) 300 MG capsule Take 900 mg by mouth 4 (four) times daily.     [provider]   HYDROcodone-acetaminophen (NORCO/VICODIN) 5-325 MG tablet Take 1-2 tablets by mouth every 6 (six) hours as needed. 02/05/20   Davonna Belling, MD  levothyroxine (SYNTHROID, LEVOTHROID) 25 MCG tablet Take 25 mcg by mouth daily before breakfast.    [provider]  LORazepam (ATIVAN) 0.5 MG tablet Take 1-1.5 mg by mouth at bedtime as needed for anxiety or sleep.     [provider]  methocarbamol (ROBAXIN) 500 MG tablet Take 1 tablet (500 mg total) by mouth 4 (four) times daily. 07/10/18   Stilwell, Bryson L, PA-C  nitroGLYCERIN (NITROSTAT) 0.4 MG SL tablet Place 1 tablet (0.4 mg total) under the tongue every 5 (five) minutes as  needed. 12/24/19   Josue Hector, MD  Polyethyl Glycol-Propyl Glycol (SYSTANE ULTRA) 0.4-0.3 % SOLN Place 2 drops into both eyes as needed (for dry eyes).     [provider]  Probiotic Product (TRUBIOTICS PO) Take 1 capsule by mouth at bedtime.    [provider]  rizatriptan (MAXALT-MLT) 10 MG disintegrating tablet Take 10 mg by mouth as needed (for migraines and may repeat once in 2 hours, if no relief).     [provider]    Allergies    Sulfa antibiotics and Coconut oil  Review of Systems   Review of Systems  Constitutional: Positive for appetite change.  HENT: Negative for congestion.   Respiratory: Negative for shortness of breath.   Gastrointestinal: Positive for abdominal pain, constipation, nausea and vomiting.  Genitourinary: Negative for flank pain.  Musculoskeletal: Negative for back pain.  Skin: Negative for rash.  Neurological: Negative for weakness.  Psychiatric/Behavioral: Negative for confusion.    Physical Exam Updated Vital Signs BP 120/78 (BP Location: Left Arm)   Pulse (S) (!) 53   Temp 97.9 F (36.6 C) (Oral)   Resp 12   Ht 6\' 3"  (1.905 m)   Wt 88.5 kg   SpO2 100%   BMI 24.37 kg/m   Physical Exam Vitals and nursing note reviewed.  HENT:     Head: Atraumatic.  Cardiovascular:      Rate and Rhythm: Normal rate.  Pulmonary:     Effort: Pulmonary effort is normal.  Abdominal:     Tenderness: There is abdominal tenderness.     Comments: Left lower quadrant/left inguinal tenderness.  No hernia palpated.  Skin:    General: Skin is warm.     Capillary Refill: Capillary refill takes less than 2 seconds.  Neurological:     Mental Status: He is alert.  Psychiatric:        Mood and Affect: Mood normal.     ED Results / Procedures / Treatments   Labs (all labs ordered are listed, but only abnormal results are displayed) Labs Reviewed  COMPREHENSIVE METABOLIC PANEL - Abnormal; Notable for the following components:      Result Value   AST 14 (*)    All other components within normal limits  SARS CORONAVIRUS 2 BY RT PCR (HOSPITAL ORDER, [place] [facility])  CBC WITH DIFFERENTIAL/PLATELET  URINALYSIS, ROUTINE W REFLEX MICROSCOPIC    EKG None  Radiology CT ABDOMEN PELVIS W CONTRAST  Result Date: 02/05/2020 CLINICAL DATA:  Left lower quadrant pain EXAM: CT ABDOMEN AND PELVIS WITH CONTRAST TECHNIQUE: Multidetector CT imaging of the abdomen and pelvis was performed using the standard protocol following bolus administration of intravenous contrast. CONTRAST:  116mL OMNIPAQUE IOHEXOL 300 MG/ML  SOLN COMPARISON:  2017 FINDINGS: Lower chest: Linear subsegmental atelectasis Hepatobiliary: No focal liver abnormality is seen. No gallstones, gallbladder wall thickening, or biliary dilatation. Pancreas: Unremarkable. Spleen: Unremarkable. Adrenals/Urinary Tract: Adrenals, kidneys, and bladder are unremarkable. Stomach/Bowel: Sigmoid diverticulosis. There is mild surrounding fat infiltration. Bowel is normal in caliber. Vascular/Lymphatic: Mild aortic atherosclerosis. No enlarged lymph nodes identified. Reproductive: Prostate is stable in appearance. Other: No ascites. Musculoskeletal: No acute osseous abnormality. IMPRESSION: Acute sigmoid diverticulitis. No evidence  of perforation or abscess. Electronically Signed   By: Macy Mis M.D.   On: 02/05/2020 09:46    Procedures Procedures (including critical care time)  Medications Ordered in ED Medications  fentaNYL (SUBLIMAZE) injection 50 mcg (50 mcg Intravenous Given 02/05/20 0912)  iohexol (OMNIPAQUE)  300 MG/ML solution 100 mL (100 mLs Intravenous Contrast Given 02/05/20 0911)    ED Course  I have reviewed the triage vital signs and the nursing notes.  Pertinent labs & imaging results that were available during my care of the patient were reviewed by me and considered in my medical decision making (see chart for details).    MDM Rules/Calculators/A&P                          Patient with abdominal pain.  Lower abdomen.  Had vomiting that is since improved.  Work reassuring.  CT scan does show a uncomplicated diverticulitis.  Will treat with antibiotics and have follow-up with gastroenterology. Final Clinical Impression(s) / ED Diagnoses Final diagnoses:  Diverticulitis    Rx / DC Orders ED Discharge Orders         Ordered    amoxicillin-clavulanate (AUGMENTIN) 875-125 MG tablet  Every 12 hours        02/05/20 1050    HYDROcodone-acetaminophen (NORCO/VICODIN) 5-325 MG tablet  Every 6 hours PRN        02/05/20 1050           Davonna Belling, MD 02/05/20 1104

## 2020-03-09 DIAGNOSIS — E7849 Other hyperlipidemia: Secondary | ICD-10-CM | POA: Diagnosis not present

## 2020-03-09 DIAGNOSIS — E063 Autoimmune thyroiditis: Secondary | ICD-10-CM | POA: Diagnosis not present

## 2020-03-23 ENCOUNTER — Ambulatory Visit: Payer: Self-pay | Admitting: General Surgery

## 2020-03-23 DIAGNOSIS — K4090 Unilateral inguinal hernia, without obstruction or gangrene, not specified as recurrent: Secondary | ICD-10-CM | POA: Diagnosis not present

## 2020-04-18 DIAGNOSIS — Z6825 Body mass index (BMI) 25.0-25.9, adult: Secondary | ICD-10-CM | POA: Diagnosis not present

## 2020-04-18 DIAGNOSIS — Z23 Encounter for immunization: Secondary | ICD-10-CM | POA: Diagnosis not present

## 2020-04-18 DIAGNOSIS — E663 Overweight: Secondary | ICD-10-CM | POA: Diagnosis not present

## 2020-04-18 DIAGNOSIS — M5410 Radiculopathy, site unspecified: Secondary | ICD-10-CM | POA: Diagnosis not present

## 2020-04-21 DIAGNOSIS — Z23 Encounter for immunization: Secondary | ICD-10-CM | POA: Diagnosis not present

## 2020-04-24 DIAGNOSIS — Z01818 Encounter for other preprocedural examination: Secondary | ICD-10-CM | POA: Diagnosis not present

## 2020-04-24 NOTE — Progress Notes (Signed)
CARDIOLOGY CONSULT NOTE       Patient ID: Frank Weber MRN: [id number] DOB/AGE: 08-19-1950 69 y.o.  Admit date: (Not on file) Referring Physician: [name] Primary Physician: [name], MD Primary Cardiologist: New Reason for Consultation: CAD  Active Problems:   * No active hospital problems. *   HPI:  69 y.o. referred by Dr [name] for CAD rist seen 11/30/19  Complained of SSCP 2-3 weeks on office visit 11/05/19 Seen in [facility] ED for same. Pain on left side and band around entire front and back. Near diaphragm Smoker over 30 pack years. Pain not always related to exertion No associated pleurisy, palpitations, syncope or dyspnea. 2009 had no obstructive CAD at cath. And normal echo in 2016 Pain helped by fentanyl w/u negative with no acute ECG changes, negative Troponin CT no PE some lung nodules and emphysema He had been seen by [facility] Dr Liz Beach and Resa Miner to have congestion and some atypical pain in chest Sounds more like allergic rhinitis and COPD He smokes mostly cigars now daily  He is retired after 28 years in [employer] Married x 2 with one son in [place] done 03/24/20 reviewed. Calcium score 227 which was 64 th percentile FFR CT 0.81 mid and 0.78 distal LAD Trial of medical Rx indicated  TTE reviewed from 12/15/19 Normal EF trivial MR/AR   Seen in ER 02/05/20 with diverticulitis Rx with Augmentin and Norco. Also history of bilateral inguinal hernias  No angina. Abdomen improved Going to [place] near [place] to be with family for Thanksgiving   ROS All other systems reviewed and negative except as noted above  Past Medical History:  Diagnosis Date  . Arthritis    right knee  . Constipation   . Frequency of urination   . GERD (gastroesophageal reflux disease)   . Heart murmur    "slight"   . History of kidney stones   . History of melanoma excision    2012--  NECK/ HEAD  . History of squamous cell carcinoma  excision    RIGHT EAR  . Hyperlipidemia   . Hypothyroidism   . IBS (irritable bowel syndrome)    states slight  . Lumbar stenosis    L5 - S1  . Migraines   . Peripheral neuropathy    legs and feet  . Right flank pain   . Right ureteral stone   . Urgency of urination   . Wears glasses     Family History  Problem Relation Age of Onset  . Colon cancer Other        Age of Onset-late 60's    Social History   Socioeconomic History  . Marital status: Married    Spouse name: Not on file  . Number of children: Not on file  . Years of education: Not on file  . Highest education level: Not on file  Occupational History  . Not on file  Tobacco Use  . Smoking status: Current Some Day Smoker    Packs/day: 0.00    Years: 30.00    Pack years: 0.00    Types: Cigars  . Smokeless tobacco: Never Used  . Tobacco comment: smokes an occasional cigar  Vaping Use  . Vaping Use: Never used  Substance and Sexual Activity  . Alcohol use: Yes    Comment: seldom   . Drug use: No  . Sexual activity: Not on file  Other Topics Concern  . Not on  file  Social History Narrative  . Not on file   Social Determinants of Health   Financial Resource Strain:   . Difficulty of Paying Living Expenses: Not on file  Food Insecurity:   . Worried About Charity fundraiser in the Last Year: Not on file  . Ran Out of Food in the Last Year: Not on file  Transportation Needs:   . Lack of Transportation (Medical): Not on file  . Lack of Transportation (Non-Medical): Not on file  Physical Activity:   . Days of Exercise per Week: Not on file  . Minutes of Exercise per Session: Not on file  Stress:   . Feeling of Stress : Not on file  Social Connections:   . Frequency of Communication with Friends and Family: Not on file  . Frequency of Social Gatherings with Friends and Family: Not on file  . Attends Religious Services: Not on file  . Active Member of Clubs or Organizations: Not on file  . Attends  Archivist Meetings: Not on file  . Marital Status: Not on file  Intimate Partner Violence:   . Fear of Current or Ex-Partner: Not on file  . Emotionally Abused: Not on file  . Physically Abused: Not on file  . Sexually Abused: Not on file    Past Surgical History:  Procedure Laterality Date  . CARDIAC CATHETERIZATION  07-08-2007  dr Tami Ribas    no significant CAD, preserved LVF, ef 50%//  30% pLAD  . CARDIOVASCULAR STRESS TEST  06-20-2011  dr croitoru   Low risk study/  normal perfusion scan showing attenuation artifact in the anterior region of myocardium with no ischemia , infarct or scar/  normal LV funciton and wall motion , ef 62%  . COLONOSCOPY N/A 02/17/2013   Procedure: COLONOSCOPY;  Surgeon: Rogene Houston, MD;  [facility];  Service: Endoscopy;  Laterality: N/A;  225  . CYSTOSCOPY W/ URETERAL STENT PLACEMENT Right 06/18/2015   Procedure: CYSTOSCOPY WITH RIGHT RETROGRADE PYELOGRAM RIGHT URETERAL STENT PLACEMENT;  Surgeon: [name], MD;  [facility];  Service: Urology;  Laterality: Right;  . CYSTOSCOPY/URETEROSCOPY/HOLMIUM LASER/STENT PLACEMENT Right 06/27/2015   Procedure: CYSTOSCOPY RIGHT URETEROSCOPY  STENT REMOVAL; STONE EXTRACTION WITH BASKET;  Surgeon: [name], MD;  Location: [place];  Service: Urology;  Laterality: Right;  . HEAD & NECK SKIN LESION EXCISIONAL BIOPSY    . HOLMIUM LASER APPLICATION Right 12/18/6267   Procedure: HOLMIUM LASER LITHOTRIPSY ;  Surgeon: [name], MD;  Location: [facility];  Service: Urology;  Laterality: Right;  . I & D EXTREMITY Right 07/14/2018   Procedure: IRRIGATION AND DEBRIDEMENT RIGHT KNEE WITH CLOSURE;  Surgeon: Nicholes Stairs, MD;  Location: [place];  Service: Orthopedics;  Laterality: Right;  . INGUINAL HERNIA REPAIR Left 10/21/2007  . KNEE ARTHROSCOPY W/ ACL RECONSTRUCTION Right 1992  . MENISCUS REPAIR Right    2019, dr Theda Sers   . SHOULDER ARTHROSCOPY WITH OPEN ROTATOR  CUFF REPAIR Left 07/27/2014   Procedure: LEFT SHOULDER ARTHROSCOPY WITH MINI OPEN ROTATOR CUFF REPAIR;  Surgeon: [name], MD;  Location: [place];  Service: Orthopedics;  Laterality: Left;  . SHOULDER ARTHROSCOPY WITH OPEN ROTATOR CUFF REPAIR Right 2006  . TONSILLECTOMY AND ADENOIDECTOMY  age 30  . TOTAL KNEE ARTHROPLASTY Right 07/10/2018   Procedure: TOTAL KNEE ARTHROPLASTY;  Surgeon: [name], MD;  [facility];  Service: Orthopedics;  Laterality: Right;  . TRANSTHORACIC ECHOCARDIOGRAM  07-30-2007   normal LVF, ef >55%/  mild AR, MR , PR and TR/  mild AV sclerosis without stenosis/   . URETEROSCOPY Right 06/18/2015   Procedure: URETEROSCOPY;  Surgeon: [name], MD;  [facility];  Service: Urology;  Laterality: Right;      Current Outpatient Medications:  .  ALPRAZOLAM XR 1 MG 24 hr tablet, Take 1 mg by mouth at bedtime., Disp: , Rfl:  .  aspirin EC 81 MG tablet, Take 1 tablet (81 mg total) by mouth daily. Swallow whole., Disp: 90 tablet, Rfl: 3 .  atorvastatin (LIPITOR) 20 MG tablet, Take 20 mg by mouth daily., Disp: , Rfl:  .  gabapentin (NEURONTIN) 300 MG capsule, Take 900 mg by mouth 4 (four) times daily. , Disp: , Rfl:  .  IBU 800 MG tablet, Take 800 mg by mouth every 8 (eight) hours as needed., Disp: , Rfl:  .  levothyroxine (SYNTHROID, LEVOTHROID) 25 MCG tablet, Take 25 mcg by mouth daily before breakfast., Disp: , Rfl:  .  LORazepam (ATIVAN) 0.5 MG tablet, Take 1-1.5 mg by mouth at bedtime as needed for anxiety or sleep. , Disp: , Rfl:  .  nitroGLYCERIN (NITROSTAT) 0.4 MG SL tablet, Place 1 tablet (0.4 mg total) under the tongue every 5 (five) minutes as needed., Disp: 25 tablet, Rfl: 3 .  Polyethyl Glycol-Propyl Glycol (SYSTANE ULTRA) 0.4-0.3 % SOLN, Place 2 drops into both eyes as needed (for dry eyes). , Disp: , Rfl:  .  Probiotic Product (TRUBIOTICS PO), Take 1 capsule by mouth at bedtime., Disp: , Rfl:  .  rizatriptan (MAXALT-MLT) 10 MG  disintegrating tablet, Take 10 mg by mouth as needed (for migraines and may repeat once in 2 hours, if no relief). , Disp: , Rfl:     Physical Exam: Blood pressure 120/72, pulse (!) 52, height 6\' 3"  (1.905 m), weight 94.8 kg, SpO2 98 %.    Affect appropriate Healthy:  appears stated age 65: normal Neck supple with no adenopathy JVP normal no bruits no thyromegaly Lungs clear with no wheezing and good diaphragmatic motion Heart:  S1/S2 SEM  murmur, no rub, gallop or click PMI normal Abdomen: benighn, BS positve, no tenderness, no AAA no bruit.  No HSM or HJR Distal pulses intact with no bruits No edema Neuro non-focal Skin warm and dry No muscular weakness   Labs:   Lab Results  Component Value Date   WBC 7.5 02/05/2020   HGB 15.6 02/05/2020   HCT 47.7 02/05/2020   MCV 98.4 02/05/2020   PLT 189 02/05/2020   No results for input(s): NA, K, CL, CO2, BUN, CREATININE, CALCIUM, PROT, BILITOT, ALKPHOS, ALT, AST, GLUCOSE in the last 168 hours.  Invalid input(s): LABALBU Lab Results  Component Value Date   TROPONINI <0.03 08/01/2014   No results found for: CHOL No results found for: HDL No results found for: LDLCALC No results found for: TRIG No results found for: CHOLHDL No results found for: LDLDIRECT    Radiology: No results found.  EKG: SR rate 55 LAD no acute ST changes    ASSESSMENT AND PLAN:   1. Chest Pain:  Normal cath in 2016 atypical pain with moderate LAD disease by cardiac CTA/FFR CT 03/22/20 Continue ASA/Statin  Not on beta blocker due to low resting HR And BP   2. HLD:  On statin labs with primary  3. Murmur: 2/6 SEM sounds benign echo 12/15/19 with normal EF and trivial AR/MR Observe   F/U in  a year    Signed: Jenkins Rouge 05/02/2020, 9:46 AM

## 2020-04-27 DIAGNOSIS — G8918 Other acute postprocedural pain: Secondary | ICD-10-CM | POA: Diagnosis not present

## 2020-04-27 DIAGNOSIS — K4090 Unilateral inguinal hernia, without obstruction or gangrene, not specified as recurrent: Secondary | ICD-10-CM | POA: Diagnosis not present

## 2020-05-02 ENCOUNTER — Ambulatory Visit (INDEPENDENT_AMBULATORY_CARE_PROVIDER_SITE_OTHER): Payer: Medicare Other | Admitting: Cardiovascular Disease

## 2020-05-02 ENCOUNTER — Encounter: Payer: Self-pay | Admitting: Cardiovascular Disease

## 2020-05-02 ENCOUNTER — Other Ambulatory Visit: Payer: Self-pay

## 2020-05-02 VITALS — BP 120/72 | HR 52 | Ht 75.0 in | Wt 209.0 lb

## 2020-05-02 DIAGNOSIS — I209 Angina pectoris, unspecified: Secondary | ICD-10-CM

## 2020-05-02 DIAGNOSIS — I2510 Atherosclerotic heart disease of native coronary artery without angina pectoris: Secondary | ICD-10-CM | POA: Diagnosis not present

## 2020-05-02 NOTE — Patient Instructions (Signed)
Medication Instructions:  Your physician recommends that you continue on your current medications as directed. Please refer to the Current Medication list given to you today.  *If you need a refill on your cardiac medications before your next appointment, please call your pharmacy*   Lab Work: None today If you have labs (blood work) drawn today and your tests are completely normal, you will receive your results only by: . MyChart Message (if you have MyChart) OR . A paper copy in the mail If you have any lab test that is abnormal or we need to change your treatment, we will call you to review the results.   Testing/Procedures: None today   Follow-Up: At [facility], you and your health needs are our priority.  As part of our continuing mission to provide you with exceptional heart care, we have created designated Provider Care Teams.  These Care Teams include your primary Cardiologist (physician) and Advanced Practice Providers (APPs -  Physician Assistants and Nurse Practitioners) who all work together to provide you with the care you need, when you need it.  We recommend signing up for the patient portal called "MyChart".  Sign up information is provided on this After Visit Summary.  MyChart is used to connect with patients for Virtual Visits (Telemedicine).  Patients are able to view lab/test results, encounter notes, upcoming appointments, etc.  Non-urgent messages can be sent to your provider as well.   To learn more about what you can do with MyChart, go to [email or web address].    Your next appointment:   12 month(s)  The format for your next appointment:   In Person  Provider:   Peter Nishan, MD   Other Instructions None      Thank you for choosing [facility] !         

## 2020-06-09 DIAGNOSIS — G609 Hereditary and idiopathic neuropathy, unspecified: Secondary | ICD-10-CM | POA: Diagnosis not present

## 2020-06-09 DIAGNOSIS — E7849 Other hyperlipidemia: Secondary | ICD-10-CM | POA: Diagnosis not present

## 2020-08-01 ENCOUNTER — Ambulatory Visit (INDEPENDENT_AMBULATORY_CARE_PROVIDER_SITE_OTHER): Payer: Medicare Other | Admitting: Podiatry

## 2020-08-01 ENCOUNTER — Ambulatory Visit (INDEPENDENT_AMBULATORY_CARE_PROVIDER_SITE_OTHER): Payer: Medicare Other

## 2020-08-01 ENCOUNTER — Other Ambulatory Visit: Payer: Self-pay

## 2020-08-01 DIAGNOSIS — B351 Tinea unguium: Secondary | ICD-10-CM

## 2020-08-01 DIAGNOSIS — L84 Corns and callosities: Secondary | ICD-10-CM

## 2020-08-01 DIAGNOSIS — M2041 Other hammer toe(s) (acquired), right foot: Secondary | ICD-10-CM

## 2020-08-01 MED ORDER — EFINACONAZOLE 10 % EX SOLN: 1.0000 [drp] | Freq: Every day | CUTANEOUS | 11 refills | Status: DC

## 2020-08-05 NOTE — Progress Notes (Signed)
Subjective:   Patient ID: Frank Weber, male   DOB: 70 y.o.   MRN: [id number]   HPI 71 year old male presents the office today for concerns of a painful spot in between his fourth and fifth toes.  This is an ongoing issue for some time and the area is painful with pressure and with shoes.  Denies any drainage or pus or any swelling.  He said no recent treatment.  Also secondary concerns of toenail fungus.  No recent treatment.  No pain in the nails.   Review of Systems  All other systems reviewed and are negative.  Past Medical History:  Diagnosis Date  . Arthritis    right knee  . Constipation   . Frequency of urination   . GERD (gastroesophageal reflux disease)   . Heart murmur    "slight"   . History of kidney stones   . History of melanoma excision    2012--  NECK/ HEAD  . History of squamous cell carcinoma excision    RIGHT EAR  . Hyperlipidemia   . Hypothyroidism   . IBS (irritable bowel syndrome)    states slight  . Lumbar stenosis    L5 - S1  . Migraines   . Peripheral neuropathy    legs and feet  . Right flank pain   . Right ureteral stone   . Urgency of urination   . Wears glasses     Past Surgical History:  Procedure Laterality Date  . CARDIAC CATHETERIZATION  07-08-2007  dr Tami Ribas    no significant CAD, preserved LVF, ef 50%//  30% pLAD  . CARDIOVASCULAR STRESS TEST  06-20-2011  dr croitoru   Low risk study/  normal perfusion scan showing attenuation artifact in the anterior region of myocardium with no ischemia , infarct or scar/  normal LV funciton and wall motion , ef 62%  . COLONOSCOPY N/A 02/17/2013   Procedure: COLONOSCOPY;  Surgeon: Rogene Houston, MD;  [facility];  Service: Endoscopy;  Laterality: N/A;  225  . CYSTOSCOPY W/ URETERAL STENT PLACEMENT Right 06/18/2015   Procedure: CYSTOSCOPY WITH RIGHT RETROGRADE PYELOGRAM RIGHT URETERAL STENT PLACEMENT;  Surgeon: [name], MD;  Location: [facility];  Service: Urology;  Laterality: Right;   . CYSTOSCOPY/URETEROSCOPY/HOLMIUM LASER/STENT PLACEMENT Right 06/27/2015   Procedure: CYSTOSCOPY RIGHT URETEROSCOPY  STENT REMOVAL; STONE EXTRACTION WITH BASKET;  Surgeon: [name], MD;  Location: [facility];  Service: Urology;  Laterality: Right;  . HEAD & NECK SKIN LESION EXCISIONAL BIOPSY    . HOLMIUM LASER APPLICATION Right 11/05/4130   Procedure: HOLMIUM LASER LITHOTRIPSY ;  Surgeon: [name], MD;  Location: [facility];  Service: Urology;  Laterality: Right;  . I & D EXTREMITY Right 07/14/2018   Procedure: IRRIGATION AND DEBRIDEMENT RIGHT KNEE WITH CLOSURE;  Surgeon: Nicholes Stairs, MD;  Location: [place];  Service: Orthopedics;  Laterality: Right;  . INGUINAL HERNIA REPAIR Left 10/21/2007  . KNEE ARTHROSCOPY W/ ACL RECONSTRUCTION Right 1992  . MENISCUS REPAIR Right    2019, dr Theda Sers   . SHOULDER ARTHROSCOPY WITH OPEN ROTATOR CUFF REPAIR Left 07/27/2014   Procedure: LEFT SHOULDER ARTHROSCOPY WITH MINI OPEN ROTATOR CUFF REPAIR;  Surgeon: [name], MD;  Location: [place];  Service: Orthopedics;  Laterality: Left;  . SHOULDER ARTHROSCOPY WITH OPEN ROTATOR CUFF REPAIR Right 2006  . TONSILLECTOMY AND ADENOIDECTOMY  age 11  . TOTAL KNEE ARTHROPLASTY Right 07/10/2018   Procedure: TOTAL KNEE ARTHROPLASTY;  Surgeon: [name], MD;  [facility]: [facility];  Service: Orthopedics;  Laterality: Right;  . TRANSTHORACIC ECHOCARDIOGRAM  07-30-2007   normal LVF, ef >55%/  mild AR, MR , PR and TR/  mild AV sclerosis without stenosis/   . URETEROSCOPY Right 06/18/2015   Procedure: URETEROSCOPY;  Surgeon: [name], MD;  [facility];  Service: Urology;  Laterality: Right;     Current Outpatient Medications:  .  Efinaconazole 10 % SOLN, Apply 1 drop topically daily., Disp: 4 mL, Rfl: 11 .  Efinaconazole 10 % SOLN, Apply 1 drop topically daily., Disp: 4 mL, Rfl: 11 .  ALPRAZOLAM XR 1 MG 24 hr tablet, Take 1 mg by mouth at bedtime., Disp: ,  Rfl:  .  aspirin EC 81 MG tablet, Take 1 tablet (81 mg total) by mouth daily. Swallow whole., Disp: 90 tablet, Rfl: 3 .  atorvastatin (LIPITOR) 20 MG tablet, Take 20 mg by mouth daily., Disp: , Rfl:  .  chlorhexidine (PERIDEX) 0.12 % solution, SMARTSIG:0.5 Ounce(s) By Mouth Morning-Evening, Disp: , Rfl:  .  gabapentin (NEURONTIN) 300 MG capsule, Take 900 mg by mouth 4 (four) times daily. , Disp: , Rfl:  .  IBU 800 MG tablet, Take 800 mg by mouth every 8 (eight) hours as needed., Disp: , Rfl:  .  levothyroxine (SYNTHROID, LEVOTHROID) 25 MCG tablet, Take 25 mcg by mouth daily before breakfast., Disp: , Rfl:  .  LORazepam (ATIVAN) 0.5 MG tablet, Take 1-1.5 mg by mouth at bedtime as needed for anxiety or sleep. , Disp: , Rfl:  .  nitroGLYCERIN (NITROSTAT) 0.4 MG SL tablet, Place 1 tablet (0.4 mg total) under the tongue every 5 (five) minutes as needed., Disp: 25 tablet, Rfl: 3 .  Polyethyl Glycol-Propyl Glycol (SYSTANE ULTRA) 0.4-0.3 % SOLN, Place 2 drops into both eyes as needed (for dry eyes). , Disp: , Rfl:  .  predniSONE (DELTASONE) 10 MG tablet, TAKE 5 TABLETS BY MOUTHOFOR 3 DAYS, THEN 4 FOR 3 DAYS, THEN 3 FOR 3 DAYS, THEN 2 FOR 3 DAYS, THEN 1 FOR 3 DAYS, Disp: , Rfl:  .  Probiotic Product (TRUBIOTICS PO), Take 1 capsule by mouth at bedtime., Disp: , Rfl:  .  rizatriptan (MAXALT-MLT) 10 MG disintegrating tablet, Take 10 mg by mouth as needed (for migraines and may repeat once in 2 hours, if no relief). , Disp: , Rfl:   Allergies  Allergen Reactions  . Sulfa Antibiotics Anaphylaxis, Shortness Of Breath and Rash  . Coconut Oil Rash         Objective:  Physical Exam  General: AAO x3, NAD  Dermatological: Hyperkeratotic tissue on the medial aspect of the fifth PIPJ.  There is no underlying ulceration drainage or signs of infection noted today.  Nails appear be hypertrophic, dystrophic with yellow-brown discoloration.  No pain in the nails.  No edema, erythema.  No open lesions.  Vascular:  Dorsalis Pedis artery and Posterior Tibial artery pedal pulses are 2/4 bilateral with immedate capillary fill time. There is no pain with calf compression, swelling, warmth, erythema.   Neruologic: Grossly intact via light touch bilateral.   Musculoskeletal: Adductovarus present fifth toe.  Muscular strength 5/5 in all groups tested bilateral.  Gait: Unassisted, Nonantalgic.       Assessment:   Digital deformity resulting in hyperkeratotic lesion right fifth toe; onychomycosis    Plan:  -Treatment options discussed including all alternatives, risks, and complications -Etiology of symptoms were discussed -X-rays were obtained and reviewed with the patient.  Adductovarus was present.  No evidence of acute fracture. -Debrided the hyperkeratotic lesion without any complications or bleeding.  We discussed shoe modifications, offloading.  Discussed surgical intervention if needed as well. Imelda Pillow for nail fungus  Trula Slade DPM

## 2020-08-09 ENCOUNTER — Telehealth: Payer: Self-pay | Admitting: *Deleted

## 2020-08-09 NOTE — Telephone Encounter (Signed)
Frank Weber w/ [facility] [phone]) is calling to request a change on the Jublia. Medication is too expensive (600.00) with Medicare plan and cannot use any rebates. She has a cheaper alternative which is Penlac solution-8% or Kerydin -5, patient is aware. Please advise.

## 2020-08-11 ENCOUNTER — Other Ambulatory Visit: Payer: Self-pay | Admitting: Podiatry

## 2020-08-11 MED ORDER — TAVABOROLE 5 % EX SOLN: 1.0000 [drp] | CUTANEOUS | 2 refills | Status: DC

## 2020-08-11 NOTE — Telephone Encounter (Signed)
Called and informed patient that Cranford Mon was sent to pharmacy, verbalized understanding.

## 2020-08-11 NOTE — Telephone Encounter (Signed)
Kerydin ordered °

## 2020-08-21 DIAGNOSIS — G43909 Migraine, unspecified, not intractable, without status migrainosus: Secondary | ICD-10-CM | POA: Diagnosis not present

## 2020-08-21 DIAGNOSIS — G4709 Other insomnia: Secondary | ICD-10-CM | POA: Diagnosis not present

## 2020-09-06 DIAGNOSIS — G609 Hereditary and idiopathic neuropathy, unspecified: Secondary | ICD-10-CM | POA: Diagnosis not present

## 2020-09-06 DIAGNOSIS — E7849 Other hyperlipidemia: Secondary | ICD-10-CM | POA: Diagnosis not present

## 2020-09-12 DIAGNOSIS — H1031 Unspecified acute conjunctivitis, right eye: Secondary | ICD-10-CM | POA: Diagnosis not present

## 2020-09-18 DIAGNOSIS — K5900 Constipation, unspecified: Secondary | ICD-10-CM | POA: Diagnosis not present

## 2020-09-18 DIAGNOSIS — Z1331 Encounter for screening for depression: Secondary | ICD-10-CM | POA: Diagnosis not present

## 2020-09-18 DIAGNOSIS — R110 Nausea: Secondary | ICD-10-CM | POA: Diagnosis not present

## 2020-09-18 DIAGNOSIS — K5732 Diverticulitis of large intestine without perforation or abscess without bleeding: Secondary | ICD-10-CM | POA: Diagnosis not present

## 2020-09-18 DIAGNOSIS — Z6825 Body mass index (BMI) 25.0-25.9, adult: Secondary | ICD-10-CM | POA: Diagnosis not present

## 2020-09-25 ENCOUNTER — Ambulatory Visit (HOSPITAL_COMMUNITY)
Admission: RE | Admit: 2020-09-25 | Discharge: 2020-09-25 | Disposition: A | Discharge status: Home / Self Care | Payer: Medicare Other | Source: Ambulatory Visit | Attending: Physician Assistant | Admitting: Physician Assistant

## 2020-09-25 ENCOUNTER — Other Ambulatory Visit: Payer: Self-pay | Admitting: Physician Assistant

## 2020-09-25 DIAGNOSIS — K5900 Constipation, unspecified: Secondary | ICD-10-CM

## 2020-09-25 DIAGNOSIS — R1030 Lower abdominal pain, unspecified: Secondary | ICD-10-CM

## 2020-09-25 DIAGNOSIS — E663 Overweight: Secondary | ICD-10-CM | POA: Diagnosis not present

## 2020-09-25 DIAGNOSIS — R109 Unspecified abdominal pain: Secondary | ICD-10-CM | POA: Diagnosis not present

## 2020-09-25 DIAGNOSIS — Z6825 Body mass index (BMI) 25.0-25.9, adult: Secondary | ICD-10-CM | POA: Diagnosis not present

## 2020-09-25 LAB — POCT I-STAT CREATININE: Creatinine, Ser: 1.2 mg/dL (ref 0.61–1.24)

## 2020-09-25 MED ORDER — IOHEXOL 300 MG/ML  SOLN
100.0000 mL | Freq: Once | INTRAMUSCULAR | Status: AC | PRN
  Administered 2020-09-25: 100 mL via INTRAVENOUS

## 2020-09-26 ENCOUNTER — Encounter (INDEPENDENT_AMBULATORY_CARE_PROVIDER_SITE_OTHER): Payer: Self-pay | Admitting: Gastroenterology

## 2020-09-26 ENCOUNTER — Ambulatory Visit (INDEPENDENT_AMBULATORY_CARE_PROVIDER_SITE_OTHER): Payer: Medicare Other | Admitting: Gastroenterology

## 2020-09-26 ENCOUNTER — Other Ambulatory Visit: Payer: Self-pay

## 2020-09-26 VITALS — BP 122/83 | HR 75 | Temp 97.6°F | Ht 75.0 in | Wt 200.0 lb

## 2020-09-26 DIAGNOSIS — K581 Irritable bowel syndrome with constipation: Secondary | ICD-10-CM

## 2020-09-26 MED ORDER — PEG 3350-KCL-NA BICARB-NACL 420 G PO SOLR: 4000.0000 mL | Freq: Once | ORAL | 0 refills | Status: AC

## 2020-09-26 NOTE — Patient Instructions (Addendum)
Take bowel prep in 1-2 days. Continue stool softeners daily Start taking Miralax 1 capful every 12 hours. If after two weeks there is no improvement, increase to 1 capful every 8 hours Eat prune and/or kiwi daily

## 2020-09-26 NOTE — Progress Notes (Signed)
Frank Weber, M.D. Gastroenterology & Hepatology [facility] [address] [place], [place] [address] Primary Care Physician: [name], MD [address] [place] [address]  Referring MD: PCP  Chief Complaint: Constipation  History of Present Illness: Frank Weber is a 70 y.o. male with past medical history of GERD, hypothyroidism, hyperlipidemia, IBS, who presents for evaluation of constipation.  States that one month ago he noticed recurrent episodes of worsening constipation. He states that he was having 1-2 bowel movements every day before his symptoms started but now is having a BM every 3-4 days. He even lasted 2 weeks without having a BM and became very concerned.  He reports the constipation has led to significant nausea. Denies vomiting.  The patient has tried OTC laxatives like Colace recently without any improvement.  In fact, the patient was advised by his PCP to try some Linzess 72 mcg samples for 7 days without any improvement.He has tried eating more prunes and drinking more water but this has not helped.  The patient reports that he was seen by Dr. Collene Mares one week ago.  He was given a prescription for antibiotics (amoxicillin) for presumed diverticulitis since he was also presenting some pain in the left side of his abdomen.  He finished antibiotic completely but did not have any resolution of his symptoms.  States that he also used some enemas and had very scant amount of stool output after this.   Has not started any pain medications recently.  Patient had an episodes of uncomplicated diverticulitis in 01/2020 treated with antibiotics.  Notably, the patient has been evaluated in the past other GI clinic for similar complaints with last appointment in 2014 after he presented changing his bowel movements, with predominant constipation and abdominal pain.  In fact, the patient reports that since he was a  teenager he has presented "problems with his bowels".  The patient denies having any nausea, vomiting, fever, chills, hematochezia, melena, hematemesis, diarrhea, jaundice, pruritus or weight loss.  Due to the presence of significant abdominal discomfort and constipation, he underwent a CT abdomen pelvis with IV contrast yesterday. 1. No acute gastrointestinal process. 2. Diverticulosis without signs of diverticular inflammation/diverticulitis. 3. Small fat containing umbilical hernia. Overall, there was significant amount of stool throughout the colon.  I reviewed these images myself.  Last TSH 3.49 on 11/02/2019.  He states that he had some blood testing performed recently and he requested it to be faxed to our office but I do not have these available.  Last Colonoscopy:2014 - Examination performed to cecum. Mild sigmoid colon diverticulosis. 4 small polyps ablated via cold biopsy from rectosigmoid junction (all hyperplastic) External hemorrhoids.  FHx: neg for any gastrointestinal/liver disease, grandfather colon cancer in his 25-80s Social:former smoking cigs for +20 years but quit 15 years ago, occasionally smokes cigars, alcohol or illicit drug use Surgical: inguinal hernia bilaterally  Past Medical History: Past Medical History:  Diagnosis Date  . Arthritis    right knee  . Constipation   . Frequency of urination   . GERD (gastroesophageal reflux disease)   . Heart murmur    "slight"   . History of kidney stones   . History of melanoma excision    2012--  NECK/ HEAD  . History of squamous cell carcinoma excision    RIGHT EAR  . Hyperlipidemia   . Hypothyroidism   . IBS (irritable bowel syndrome)    states slight  . Lumbar stenosis  L5 - S1  . Migraines   . Peripheral neuropathy    legs and feet  . Right flank pain   . Right ureteral stone   . Urgency of urination   . Wears glasses     Past Surgical History: Past Surgical History:  Procedure Laterality  Date  . CARDIAC CATHETERIZATION  07-08-2007  dr Tami Ribas    no significant CAD, preserved LVF, ef 50%//  30% pLAD  . CARDIOVASCULAR STRESS TEST  06-20-2011  dr croitoru   Low risk study/  normal perfusion scan showing attenuation artifact in the anterior region of myocardium with no ischemia , infarct or scar/  normal LV funciton and wall motion , ef 62%  . COLONOSCOPY N/A 02/17/2013   Procedure: COLONOSCOPY;  Surgeon: Rogene Houston, MD;  [facility];  Service: Endoscopy;  Laterality: N/A;  225  . CYSTOSCOPY W/ URETERAL STENT PLACEMENT Right 06/18/2015   Procedure: CYSTOSCOPY WITH RIGHT RETROGRADE PYELOGRAM RIGHT URETERAL STENT PLACEMENT;  Surgeon: [name], MD;  [facility];  Service: Urology;  Laterality: Right;  . CYSTOSCOPY/URETEROSCOPY/HOLMIUM LASER/STENT PLACEMENT Right 06/27/2015   Procedure: CYSTOSCOPY RIGHT URETEROSCOPY  STENT REMOVAL; STONE EXTRACTION WITH BASKET;  Surgeon: [name], MD;  Location: [facility];  Service: Urology;  Laterality: Right;  . HEAD & NECK SKIN LESION EXCISIONAL BIOPSY    . HOLMIUM LASER APPLICATION Right 11/09/930   Procedure: HOLMIUM LASER LITHOTRIPSY ;  Surgeon: [name], MD;  Location: [facility];  Service: Urology;  Laterality: Right;  . I & D EXTREMITY Right 07/14/2018   Procedure: IRRIGATION AND DEBRIDEMENT RIGHT KNEE WITH CLOSURE;  Surgeon: Nicholes Stairs, MD;  Location: [place];  Service: Orthopedics;  Laterality: Right;  . INGUINAL HERNIA REPAIR Left 10/21/2007  . KNEE ARTHROSCOPY W/ ACL RECONSTRUCTION Right 1992  . MENISCUS REPAIR Right    2019, dr Theda Sers   . SHOULDER ARTHROSCOPY WITH OPEN ROTATOR CUFF REPAIR Left 07/27/2014   Procedure: LEFT SHOULDER ARTHROSCOPY WITH MINI OPEN ROTATOR CUFF REPAIR;  Surgeon: [name], MD;  Location: [place];  Service: Orthopedics;  Laterality: Left;  . SHOULDER ARTHROSCOPY WITH OPEN ROTATOR CUFF REPAIR Right 2006  . TONSILLECTOMY AND  ADENOIDECTOMY  age 70  . TOTAL KNEE ARTHROPLASTY Right 07/10/2018   Procedure: TOTAL KNEE ARTHROPLASTY;  Surgeon: [name], MD;  [facility];  Service: Orthopedics;  Laterality: Right;  . TRANSTHORACIC ECHOCARDIOGRAM  07-30-2007   normal LVF, ef >55%/  mild AR, MR , PR and TR/  mild AV sclerosis without stenosis/   . URETEROSCOPY Right 06/18/2015   Procedure: URETEROSCOPY;  Surgeon: [name], MD;  [facility];  Service: Urology;  Laterality: Right;    Family History: Family History  Problem Relation Age of Onset  . Colon cancer Other        Age of Onset-late 70's    Social History: Social History   Tobacco Use  Smoking Status Current Some Day Smoker  . Packs/day: 0.00  . Years: 30.00  . Pack years: 0.00  . Types: Cigars  Smokeless Tobacco Never Used  Tobacco Comment   smokes an occasional cigar   Social History   Substance and Sexual Activity  Alcohol Use Yes   Comment: seldom    Social History   Substance and Sexual Activity  Drug Use No    Allergies: Allergies  Allergen Reactions  . Sulfa Antibiotics Anaphylaxis, Shortness Of Breath and Rash  . Coconut Oil Rash  Medications: Current Outpatient Medications  Medication Sig Dispense Refill  . ALPRAZOLAM XR 1 MG 24 hr tablet Take 1 mg by mouth at bedtime.    Marland Kitchen aspirin EC 81 MG tablet Take 1 tablet (81 mg total) by mouth daily. Swallow whole. 90 tablet 3  . atorvastatin (LIPITOR) 20 MG tablet Take 20 mg by mouth daily.    . Cholecalciferol (D3-1000 PO) Take 1 capsule by mouth daily.    Marland Kitchen gabapentin (NEURONTIN) 300 MG capsule Take 900 mg by mouth 4 (four) times daily.    Marland Kitchen levothyroxine (SYNTHROID, LEVOTHROID) 25 MCG tablet Take 25 mcg by mouth daily before breakfast.    . LORazepam (ATIVAN) 0.5 MG tablet Take 1-1.5 mg by mouth at bedtime as needed for anxiety or sleep.    . nitroGLYCERIN (NITROSTAT) 0.4 MG SL tablet Place 1 tablet (0.4 mg total) under the tongue every 5 (five) minutes as  needed. 25 tablet 3  . Polyethyl Glycol-Propyl Glycol (SYSTANE ULTRA) 0.4-0.3 % SOLN Place 2 drops into both eyes as needed (for dry eyes).     . Probiotic Product (TRUBIOTICS PO) Take 1 capsule by mouth at bedtime.    . rizatriptan (MAXALT-MLT) 10 MG disintegrating tablet Take 10 mg by mouth as needed (for migraines and may repeat once in 2 hours, if no relief).     . vitamin k 100 MCG tablet Take 100 mcg by mouth daily.     No current facility-administered medications for this visit.    Review of Systems: GENERAL: negative for malaise, night sweats HEENT: No changes in hearing or vision, no nose bleeds or other nasal problems. NECK: Negative for lumps, goiter, pain and significant neck swelling RESPIRATORY: Negative for cough, wheezing CARDIOVASCULAR: Negative for chest pain, leg swelling, palpitations, orthopnea GI: SEE HPI MUSCULOSKELETAL: Negative for joint pain or swelling, back pain, and muscle pain. SKIN: Negative for lesions, rash PSYCH: Negative for sleep disturbance, mood disorder and recent psychosocial stressors. HEMATOLOGY Negative for prolonged bleeding, bruising easily, and swollen nodes. ENDOCRINE: Negative for cold or heat intolerance, polyuria, polydipsia and goiter. NEURO: negative for tremor, gait imbalance, syncope and seizures. The remainder of the review of systems is noncontributory.   Physical Exam: BP 122/83 (BP Location: Left Arm, Patient Position: Sitting, Cuff Size: Large)   Pulse 75   Temp 97.6 F (36.4 C) (Oral)   Ht 6\' 3"  (1.905 m)   Wt 200 lb (90.7 kg)   BMI 25.00 kg/m  GENERAL: The patient is AO x3, in no acute distress. HEENT: Head is normocephalic and atraumatic. EOMI are intact. Mouth is well hydrated and without lesions. NECK: Supple. No masses LUNGS: Clear to auscultation. No presence of rhonchi/wheezing/rales. Adequate chest expansion HEART: RRR, normal s1 and s2. ABDOMEN: Soft, nontender, no guarding, no peritoneal signs, and  nondistended. BS +. No masses. EXTREMITIES: Without any cyanosis, clubbing, rash, lesions or edema. NEUROLOGIC: AOx3, no focal motor deficit. SKIN: no jaundice, no rashes   Imaging/Labs: as above  I personally reviewed and interpreted the available labs, imaging and endoscopic files.  Impression and Plan: CILLIAN GWINNER is a 70 y.o. male with past medical history of GERD, hypothyroidism, hyperlipidemia, IBS, who presents for evaluation of constipation.  The patient has presented recurrent episodes of constipation.  He has not presented any red flag signs and had a recent normal CT of his abdomen with presence of large amount of stool.  Given his history of IBS, I consider this presentation is related to exacerbation of his functional  disease for which I advised the patient to start taking laxatives on a frequent basis.  Unfortunately, given his insurance coverage his options for laxatives are limited as options such as Linzess, Motegrity, Trulance or Amitiza are not covered.  The patient will be prescribed a bowel prep today to improve his bowel movement frequency and I advised him to start taking MiraLAX on a daily basis and uptitrate as needed.  The patient understood and agreed.  - Take bowel prep in 1-2 days. - Continue stool softeners daily - Start taking Miralax 1 capful every 12 hours. If after two weeks there is no improvement, increase to 1 capful every 8 hours - Eat prune and/or kiwi daily  All questions were answered.      Frank Peppers, MD Gastroenterology and Hepatology [facility]

## 2020-11-07 DIAGNOSIS — E7849 Other hyperlipidemia: Secondary | ICD-10-CM | POA: Diagnosis not present

## 2020-11-07 DIAGNOSIS — G609 Hereditary and idiopathic neuropathy, unspecified: Secondary | ICD-10-CM | POA: Diagnosis not present

## 2020-11-16 DIAGNOSIS — Z79899 Other long term (current) drug therapy: Secondary | ICD-10-CM | POA: Diagnosis not present

## 2020-11-16 DIAGNOSIS — G9009 Other idiopathic peripheral autonomic neuropathy: Secondary | ICD-10-CM | POA: Diagnosis not present

## 2020-11-16 DIAGNOSIS — G629 Polyneuropathy, unspecified: Secondary | ICD-10-CM | POA: Diagnosis not present

## 2020-12-19 DIAGNOSIS — R7309 Other abnormal glucose: Secondary | ICD-10-CM | POA: Diagnosis not present

## 2020-12-19 DIAGNOSIS — G609 Hereditary and idiopathic neuropathy, unspecified: Secondary | ICD-10-CM | POA: Diagnosis not present

## 2020-12-19 DIAGNOSIS — Z0001 Encounter for general adult medical examination with abnormal findings: Secondary | ICD-10-CM | POA: Diagnosis not present

## 2020-12-19 DIAGNOSIS — Z6825 Body mass index (BMI) 25.0-25.9, adult: Secondary | ICD-10-CM | POA: Diagnosis not present

## 2020-12-19 DIAGNOSIS — M1991 Primary osteoarthritis, unspecified site: Secondary | ICD-10-CM | POA: Diagnosis not present

## 2020-12-19 DIAGNOSIS — G43909 Migraine, unspecified, not intractable, without status migrainosus: Secondary | ICD-10-CM | POA: Diagnosis not present

## 2020-12-19 DIAGNOSIS — Z1331 Encounter for screening for depression: Secondary | ICD-10-CM | POA: Diagnosis not present

## 2020-12-19 DIAGNOSIS — E663 Overweight: Secondary | ICD-10-CM | POA: Diagnosis not present

## 2020-12-19 DIAGNOSIS — E782 Mixed hyperlipidemia: Secondary | ICD-10-CM | POA: Diagnosis not present

## 2020-12-19 DIAGNOSIS — E039 Hypothyroidism, unspecified: Secondary | ICD-10-CM | POA: Diagnosis not present

## 2020-12-19 DIAGNOSIS — N183 Chronic kidney disease, stage 3 unspecified: Secondary | ICD-10-CM | POA: Diagnosis not present

## 2020-12-19 DIAGNOSIS — G4709 Other insomnia: Secondary | ICD-10-CM | POA: Diagnosis not present

## 2021-01-08 ENCOUNTER — Encounter (INDEPENDENT_AMBULATORY_CARE_PROVIDER_SITE_OTHER): Payer: Self-pay

## 2021-01-08 ENCOUNTER — Other Ambulatory Visit (INDEPENDENT_AMBULATORY_CARE_PROVIDER_SITE_OTHER): Payer: Self-pay

## 2021-01-08 ENCOUNTER — Ambulatory Visit (INDEPENDENT_AMBULATORY_CARE_PROVIDER_SITE_OTHER): Payer: Medicare Other | Admitting: Gastroenterology

## 2021-01-08 ENCOUNTER — Other Ambulatory Visit: Payer: Self-pay

## 2021-01-08 ENCOUNTER — Encounter (INDEPENDENT_AMBULATORY_CARE_PROVIDER_SITE_OTHER): Payer: Self-pay | Admitting: Gastroenterology

## 2021-01-08 VITALS — BP 115/68 | HR 54 | Temp 98.8°F | Ht 75.0 in | Wt 194.8 lb

## 2021-01-08 DIAGNOSIS — K6289 Other specified diseases of anus and rectum: Secondary | ICD-10-CM | POA: Insufficient documentation

## 2021-01-08 DIAGNOSIS — R6881 Early satiety: Secondary | ICD-10-CM | POA: Insufficient documentation

## 2021-01-08 DIAGNOSIS — K5900 Constipation, unspecified: Secondary | ICD-10-CM | POA: Diagnosis not present

## 2021-01-08 DIAGNOSIS — R634 Abnormal weight loss: Secondary | ICD-10-CM | POA: Diagnosis not present

## 2021-01-08 NOTE — Patient Instructions (Signed)
-  We will get you scheduled for EGD and colonoscopy Please continue to do miralax consistently. You can try doing 3 times daily to see if this helps. -Continue drinking plenty of water, increase fruits and vegetables such as prunes and kiwi to help with constipation issues. -Follow up in 4 months.  It was a pleasure caring for you today!

## 2021-01-08 NOTE — Progress Notes (Signed)
Referring Provider: [name], MD Primary Care Physician:  [name], MD Primary GI Physician: Dr. Jenetta Downer Chief Complaint  Patient presents with   Follow-up    Pt states he is still having constipation. Got better for awhile after drinking go litely. Pt tried colace, having cramping in lower abdomen. Having rectal pain.woke him this morning, thinks may have a hemorroid. Has one stool every 4 -5 days. Appetite he states is fair.    HPI:   Frank Weber is a 70 y.o. male presenting today with a history of  GERD, Hypothyroidism, HLD, and IBS who presents today for a follow up evaluation of constipation with new symptoms of early satiety, weight loss and rectal pain. Last seen at our office in April 2022.  Constipation: previously prescribed bowel prep r/t severe constipation in April 2022. States that his helped. Advised to then start miralax after bowel prep complete,  1 capful every 12 hour with increase to every 8 hours if no improvement after two weeks. Prunes and kiwis recommended as well. Reports he was doing miralax twice daily with some relief, however, he ran out and just recently started back on it a few days ago. He has never tried using it TID.  States last week he had been 4-5 days without a BM, did 5 colace throughout the day without relief and then took another 4 throughout the next day without much relief. Finally had a BM a few days ago that was very hard, endorses stool balls. He is drinking plenty of water but diet not high in fruits or veggies.  He does endorse some mild lower abdominal cramping and flatus, abdominal pain improves some when he passes gas. Does endorse nausea without vomiting, feels this is improved when he is not constipated. Denies melena or blood in stools. Endorses intermittent sharp pain in his rectal area, that he describes as feeling like a hot poker inside of him, that has been ongoing for a few months, states that rectal pain woke him up at  4am this morning. States sometimes it feels like pain is due to some hard stool pressing against a hemorrhoid. Took some tylenol, attempted to have a BM and pain improved. States that he does occasionally have some pain from hemorrhoids when he has to strain to have a BM, soaks in a hot tub which seems to help some, however, no bleeding or itching reported.   He does endorse approx 15 pound unintentional weight loss over the past year. Six pound weight loss noted in chart since April 2022. Endorses normal appetite but has some early satiety for the past month.   Filed Weights   01/08/21 1052  Weight: 194 lb 12.8 oz (88.4 kg)    Patient denies melena, hematochezia, vomiting, diarrhea, dysphagia, or odyonophagia.  Last Colonoscopy:2014 examination performed to cecum. mild sigmoid colon diverticulosis, 4 small polyps ablated via cold biopsy from rectosigmoid junction (hyperplastic). External hemorrhoids.   Last Endoscopy:never  CT abd/pelvis with contrast: (09/25/20) no acute GI process, diverticulosis w/o signs of diverticular inflammation/diverticulitis. Small fat containing umbilical hernia. Overall significant amount of stool throughout colon.   Familial GI [id number] colon cancer in 70s-80s  Recommendations:  Given clinical presentation, recommend proceeding with colonoscopy and EGD at this time  Past Medical History:  Diagnosis Date   Arthritis    right knee   Constipation    Frequency of urination    GERD (gastroesophageal reflux disease)    Heart murmur    "slight"  History of kidney stones    History of melanoma excision    2012--  NECK/ HEAD   History of squamous cell carcinoma excision    RIGHT EAR   Hyperlipidemia    Hypothyroidism    IBS (irritable bowel syndrome)    states slight   Lumbar stenosis    L5 - S1   Migraines    Peripheral neuropathy    legs and feet   Right flank pain    Right ureteral stone    Urgency of urination    Wears glasses      Past Surgical History:  Procedure Laterality Date   CARDIAC CATHETERIZATION  07-08-2007  dr Tami Ribas    no significant CAD, preserved LVF, ef 50%//  30% pLAD   CARDIOVASCULAR STRESS TEST  06-20-2011  dr croitoru   Low risk study/  normal perfusion scan showing attenuation artifact in the anterior region of myocardium with no ischemia , infarct or scar/  normal LV funciton and wall motion , ef 62%   COLONOSCOPY N/A 02/17/2013   Procedure: COLONOSCOPY;  Surgeon: Rogene Houston, MD;  Location: [facility];  Service: Endoscopy;  Laterality: N/A;  [place] Right 06/18/2015   Procedure: CYSTOSCOPY WITH RIGHT RETROGRADE PYELOGRAM RIGHT URETERAL STENT PLACEMENT;  Surgeon: [name], MD;  Location: [facility];  Service: Urology;  Laterality: Right;   CYSTOSCOPY/URETEROSCOPY/HOLMIUM LASER/STENT PLACEMENT Right 06/27/2015   Procedure: CYSTOSCOPY RIGHT URETEROSCOPY  STENT REMOVAL; STONE EXTRACTION WITH BASKET;  Surgeon: [name], MD;  Location: [facility];  Service: Urology;  Laterality: Right;   HEAD & NECK SKIN LESION EXCISIONAL BIOPSY     HOLMIUM LASER APPLICATION Right [id number]   Procedure: HOLMIUM LASER LITHOTRIPSY ;  Surgeon: [name], MD;  Location: [facility];  Service: Urology;  Laterality: Right;   I & D EXTREMITY Right 07/14/2018   Procedure: IRRIGATION AND DEBRIDEMENT RIGHT KNEE WITH CLOSURE;  Surgeon: Nicholes Stairs, MD;  Location: [place];  Service: Orthopedics;  Laterality: Right;   INGUINAL HERNIA REPAIR Left 10/21/2007   KNEE ARTHROSCOPY W/ ACL RECONSTRUCTION Right 1992   MENISCUS REPAIR Right    2019, dr Theda Sers    SHOULDER ARTHROSCOPY WITH OPEN ROTATOR CUFF REPAIR Left 07/27/2014   Procedure: LEFT SHOULDER ARTHROSCOPY WITH MINI OPEN ROTATOR CUFF REPAIR;  Surgeon: [name], MD;  Location: [place];  Service: Orthopedics;  Laterality: Left;   SHOULDER ARTHROSCOPY WITH OPEN ROTATOR CUFF REPAIR  Right 2006   TONSILLECTOMY AND ADENOIDECTOMY  age 89   TOTAL KNEE ARTHROPLASTY Right 07/10/2018   Procedure: TOTAL KNEE ARTHROPLASTY;  Surgeon: [name], MD;  Location: [facility];  Service: Orthopedics;  Laterality: Right;   TRANSTHORACIC ECHOCARDIOGRAM  07-30-2007   normal LVF, ef >55%/  mild AR, MR , PR and TR/  mild AV sclerosis without stenosis/    URETEROSCOPY Right 06/18/2015   Procedure: URETEROSCOPY;  Surgeon: [name], MD;  Location: [facility];  Service: Urology;  Laterality: Right;    Current Outpatient Medications  Medication Sig Dispense Refill   ALPRAZOLAM XR 1 MG 24 hr tablet Take 1 mg by mouth at bedtime.     aspirin EC 81 MG tablet Take 1 tablet (81 mg total) by mouth daily. Swallow whole. 90 tablet 3   atorvastatin (LIPITOR) 20 MG tablet Take 20 mg by mouth daily.     Cholecalciferol (D3-1000 PO) Take 1 capsule by mouth daily.  gabapentin (NEURONTIN) 300 MG capsule Take 900 mg by mouth 4 (four) times daily.     levothyroxine (SYNTHROID, LEVOTHROID) 25 MCG tablet Take 25 mcg by mouth daily before breakfast.     LORazepam (ATIVAN) 0.5 MG tablet Take 1-1.5 mg by mouth at bedtime as needed for anxiety or sleep.     nitroGLYCERIN (NITROSTAT) 0.4 MG SL tablet Place 1 tablet (0.4 mg total) under the tongue every 5 (five) minutes as needed. 25 tablet 3   Polyethyl Glycol-Propyl Glycol (SYSTANE ULTRA) 0.4-0.3 % SOLN Place 2 drops into both eyes as needed (for dry eyes).      Probiotic Product (TRUBIOTICS PO) Take 1 capsule by mouth at bedtime.     rizatriptan (MAXALT-MLT) 10 MG disintegrating tablet Take 10 mg by mouth as needed (for migraines and may repeat once in 2 hours, if no relief).      vitamin k 100 MCG tablet Take 100 mcg by mouth daily.     No current facility-administered medications for this visit.    Allergies as of 01/08/2021 - Review Complete 01/08/2021  Allergen Reaction Noted   Sulfa antibiotics Anaphylaxis, Shortness Of Breath, and Rash 07/20/2014    Coconut oil Rash 07/20/2014    Family History  Problem Relation Age of Onset   Colon cancer Other        Age of Onset-late 30's    Social History   Socioeconomic History   Marital status: Married    Spouse name: Not on file   Number of children: Not on file   Years of education: Not on file   Highest education level: Not on file  Occupational History   Not on file  Tobacco Use   Smoking status: Former    Packs/day: 0.00    Years: 30.00    Pack years: 0.00    Types: Cigars, Cigarettes   Smokeless tobacco: Never  Vaping Use   Vaping Use: Never used  Substance and Sexual Activity   Alcohol use: Yes    Comment: seldom    Drug use: No   Sexual activity: Not on file  Other Topics Concern   Not on file  Social History Narrative   Not on file   Social Determinants of Health   Financial Resource Strain: Not on file  Food Insecurity: Not on file  Transportation Needs: Not on file  Physical Activity: Not on file  Stress: Not on file  Social Connections: Not on file   Review of Systems: Gen: Denies fever, chills, anorexia. Denies fatigue, weakness, endorses 15 lbs wt loss over past year. CV: Denies chest pain, palpitations, syncope, peripheral edema, and claudication. Resp: Denies dyspnea at rest, cough, wheezing, coughing up blood, and pleurisy. GI: Denies vomiting blood, jaundice, and fecal incontinence. Denies dysphagia or odynophagia. Derm: Denies rash, itching, dry skin Psych: Denies depression, anxiety, memory loss, confusion. No homicidal or suicidal ideation.  Heme: Denies bruising, bleeding, and enlarged lymph nodes.  Physical Exam: BP 115/68 (BP Location: Right Arm, Patient Position: Sitting, Cuff Size: Large)   Pulse (!) 54   Temp 98.8 F (37.1 C) (Oral)   Ht '6\' 3"'$  (1.905 m)   Wt 194 lb 12.8 oz (88.4 kg)   BMI 24.35 kg/m  General:   Alert and oriented. No distress noted. Pleasant and cooperative.  Head:  Normocephalic and atraumatic. Eyes:  Conjuctiva  clear without scleral icterus. Mouth:  Oral mucosa pink and moist. Good dentition. No lesions. Heart: Normal rate and rhythm, s1 and s2 heart  sounds present.  Lungs: Clear lung sounds in all lobes. Respirations equal and unlabored. Abdomen:  +BS, soft, non-tender and non-distended. No rebound or guarding. No HSM or masses noted. Rectal: small internal hemorrhoids felt posteriorly. Sphincter tone WNL. Derm: No palmar erythema or jaundice Msk:  Symmetrical without gross deformities. Normal posture. Extremities:  Without edema. Neurologic:  Alert and  oriented x4 Psych:  Alert and cooperative. Normal mood and affect.  ASSESSMENT: Frank Weber is a 70 y.o. male presenting today for follow up of constipation. Patient also endorses some recent weight loss, early satiety, nausea and rectal pain.  Constipation improved after last visit in April when patient completed bowel prep, however, subsequent use of miralax thereafter was only done BID, patient did not increase to TID as recommended. States he ran out of miralax a while back. Recent episode of constipation without a BM for 4-5 days, took multiple doses of colace over a 2 day period without much relief. States that he started back on mirlax this weekend, only doing it twice daily. He is having associated lower abdominal cramping, flatus and some nausea at times without vomiting, related to his constipation. Discussed importance of consistency with miralax and recommended that patient go ahead and increase to TID given significant constipation he is experiencing. It is also important that he continues to drink plenty of water and increases fruits and veggies in his diet. Most recent TSH (11/01/20) and potassium (02/04/21) WNL.   Intermittent rectal pain described as sharp, like "a hot poker" stabbing him, wonders if he has some really hard stools pressing on hemorrhoids, most recent episode was this morning, reporting pain woke him up around 4am.  Patient had to take tylenol and attempted to have BM by straining on commode, pain finally subsided. He denies any rectal bleeding. Occasional pain from hemorrhoids after having BM where he has to strain, soaks in a hot tub which provides relief.   Patient has also had a 6 pound weight loss since April, reportedly about 15 lb weight loss over the past year. Appetite has been okay, but, he reports some early satiety over the past month. Denies dysphagia, melena, rectal bleeding or vomiting. Given patient's symptoms and most recent colonoscopy being 8 years ago, we should proceed with a colonoscopy and EGD at this time as PUD or malignancy cannot be excluded as etiology.  *Rectal exam was performed by this provider with Dr. Jenetta Downer present as witness.   PLAN:  Schedule EGD and colonoscopy with Dr. Jenetta Downer 2. Continue miralax, increase to TID, stay consistent with use to produce best results 3. Continue to drink plenty of water and increase fruits and veggies, especially kiwi and prunes  Indications, risks and benefits of procedure discussed in detail with patient. Patient verbalized understanding and is in agreement to proceed with EGD/Colonoscopy at this time.   Patient not currently anticoagulated, on antiplatelet therapy, Aspirin or antidiabetic agents.   Follow Up: 4 months  Case discussed with Dr. Jenetta Downer who is in agreement with plan of care, as outlined above.   Swan [place] L. Alver Sorrow, MSN, APRN, AGNP-C Adult-Gerontology Nurse Practitioner [facility]

## 2021-01-08 NOTE — H&P (View-Only) (Signed)
Referring Provider: [name], MD Primary Care Physician:  [name], MD Primary GI Physician: Dr. Jenetta Downer Chief Complaint  Patient presents with   Follow-up    Pt states he is still having constipation. Got better for awhile after drinking go litely. Pt tried colace, having cramping in lower abdomen. Having rectal pain.woke him this morning, thinks may have a hemorroid. Has one stool every 4 -5 days. Appetite he states is fair.    HPI:   Frank Weber is a 70 y.o. male presenting today with a history of  GERD, Hypothyroidism, HLD, and IBS who presents today for a follow up evaluation of constipation with new symptoms of early satiety, weight loss and rectal pain. Last seen at our office in April 2022.  Constipation: previously prescribed bowel prep r/t severe constipation in April 2022. States that his helped. Advised to then start miralax after bowel prep complete,  1 capful every 12 hour with increase to every 8 hours if no improvement after two weeks. Prunes and kiwis recommended as well. Reports he was doing miralax twice daily with some relief, however, he ran out and just recently started back on it a few days ago. He has never tried using it TID.  States last week he had been 4-5 days without a BM, did 5 colace throughout the day without relief and then took another 4 throughout the next day without much relief. Finally had a BM a few days ago that was very hard, endorses stool balls. He is drinking plenty of water but diet not high in fruits or veggies.  He does endorse some mild lower abdominal cramping and flatus, abdominal pain improves some when he passes gas. Does endorse nausea without vomiting, feels this is improved when he is not constipated. Denies melena or blood in stools. Endorses intermittent sharp pain in his rectal area, that he describes as feeling like a hot poker inside of him, that has been ongoing for a few months, states that rectal pain woke him up at  4am this morning. States sometimes it feels like pain is due to some hard stool pressing against a hemorrhoid. Took some tylenol, attempted to have a BM and pain improved. States that he does occasionally have some pain from hemorrhoids when he has to strain to have a BM, soaks in a hot tub which seems to help some, however, no bleeding or itching reported.   He does endorse approx 15 pound unintentional weight loss over the past year. Six pound weight loss noted in chart since April 2022. Endorses normal appetite but has some early satiety for the past month.   Filed Weights   01/08/21 1052  Weight: 194 lb 12.8 oz (88.4 kg)    Patient denies melena, hematochezia, vomiting, diarrhea, dysphagia, or odyonophagia.  Last Colonoscopy:2014 examination performed to cecum. mild sigmoid colon diverticulosis, 4 small polyps ablated via cold biopsy from rectosigmoid junction (hyperplastic). External hemorrhoids.   Last Endoscopy:never  CT abd/pelvis with contrast: (09/25/20) no acute GI process, diverticulosis w/o signs of diverticular inflammation/diverticulitis. Small fat containing umbilical hernia. Overall significant amount of stool throughout colon.   Familial GI [id number] colon cancer in 70s-80s  Recommendations:  Given clinical presentation, recommend proceeding with colonoscopy and EGD at this time  Past Medical History:  Diagnosis Date   Arthritis    right knee   Constipation    Frequency of urination    GERD (gastroesophageal reflux disease)    Heart murmur    "slight"  History of kidney stones    History of melanoma excision    2012--  NECK/ HEAD   History of squamous cell carcinoma excision    RIGHT EAR   Hyperlipidemia    Hypothyroidism    IBS (irritable bowel syndrome)    states slight   Lumbar stenosis    L5 - S1   Migraines    Peripheral neuropathy    legs and feet   Right flank pain    Right ureteral stone    Urgency of urination    Wears glasses      Past Surgical History:  Procedure Laterality Date   CARDIAC CATHETERIZATION  07-08-2007  dr Tami Ribas    no significant CAD, preserved LVF, ef 50%//  30% pLAD   CARDIOVASCULAR STRESS TEST  06-20-2011  dr croitoru   Low risk study/  normal perfusion scan showing attenuation artifact in the anterior region of myocardium with no ischemia , infarct or scar/  normal LV funciton and wall motion , ef 62%   COLONOSCOPY N/A 02/17/2013   Procedure: COLONOSCOPY;  Surgeon: Rogene Houston, MD;  Location: [facility];  Service: Endoscopy;  Laterality: N/A;  [place] Right 06/18/2015   Procedure: CYSTOSCOPY WITH RIGHT RETROGRADE PYELOGRAM RIGHT URETERAL STENT PLACEMENT;  Surgeon: [name], MD;  Location: [facility];  Service: Urology;  Laterality: Right;   CYSTOSCOPY/URETEROSCOPY/HOLMIUM LASER/STENT PLACEMENT Right 06/27/2015   Procedure: CYSTOSCOPY RIGHT URETEROSCOPY  STENT REMOVAL; STONE EXTRACTION WITH BASKET;  Surgeon: [name], MD;  Location: [facility];  Service: Urology;  Laterality: Right;   HEAD & NECK SKIN LESION EXCISIONAL BIOPSY     HOLMIUM LASER APPLICATION Right [id number]   Procedure: HOLMIUM LASER LITHOTRIPSY ;  Surgeon: [name], MD;  Location: [facility];  Service: Urology;  Laterality: Right;   I & D EXTREMITY Right 07/14/2018   Procedure: IRRIGATION AND DEBRIDEMENT RIGHT KNEE WITH CLOSURE;  Surgeon: Nicholes Stairs, MD;  Location: [place];  Service: Orthopedics;  Laterality: Right;   INGUINAL HERNIA REPAIR Left 10/21/2007   KNEE ARTHROSCOPY W/ ACL RECONSTRUCTION Right 1992   MENISCUS REPAIR Right    2019, dr Theda Sers    SHOULDER ARTHROSCOPY WITH OPEN ROTATOR CUFF REPAIR Left 07/27/2014   Procedure: LEFT SHOULDER ARTHROSCOPY WITH MINI OPEN ROTATOR CUFF REPAIR;  Surgeon: [name], MD;  Location: [place];  Service: Orthopedics;  Laterality: Left;   SHOULDER ARTHROSCOPY WITH OPEN ROTATOR CUFF REPAIR  Right 2006   TONSILLECTOMY AND ADENOIDECTOMY  age 79   TOTAL KNEE ARTHROPLASTY Right 07/10/2018   Procedure: TOTAL KNEE ARTHROPLASTY;  Surgeon: [name], MD;  Location: [facility];  Service: Orthopedics;  Laterality: Right;   TRANSTHORACIC ECHOCARDIOGRAM  07-30-2007   normal LVF, ef >55%/  mild AR, MR , PR and TR/  mild AV sclerosis without stenosis/    URETEROSCOPY Right 06/18/2015   Procedure: URETEROSCOPY;  Surgeon: [name], MD;  Location: [facility];  Service: Urology;  Laterality: Right;    Current Outpatient Medications  Medication Sig Dispense Refill   ALPRAZOLAM XR 1 MG 24 hr tablet Take 1 mg by mouth at bedtime.     aspirin EC 81 MG tablet Take 1 tablet (81 mg total) by mouth daily. Swallow whole. 90 tablet 3   atorvastatin (LIPITOR) 20 MG tablet Take 20 mg by mouth daily.     Cholecalciferol (D3-1000 PO) Take 1 capsule by mouth daily.  gabapentin (NEURONTIN) 300 MG capsule Take 900 mg by mouth 4 (four) times daily.     levothyroxine (SYNTHROID, LEVOTHROID) 25 MCG tablet Take 25 mcg by mouth daily before breakfast.     LORazepam (ATIVAN) 0.5 MG tablet Take 1-1.5 mg by mouth at bedtime as needed for anxiety or sleep.     nitroGLYCERIN (NITROSTAT) 0.4 MG SL tablet Place 1 tablet (0.4 mg total) under the tongue every 5 (five) minutes as needed. 25 tablet 3   Polyethyl Glycol-Propyl Glycol (SYSTANE ULTRA) 0.4-0.3 % SOLN Place 2 drops into both eyes as needed (for dry eyes).      Probiotic Product (TRUBIOTICS PO) Take 1 capsule by mouth at bedtime.     rizatriptan (MAXALT-MLT) 10 MG disintegrating tablet Take 10 mg by mouth as needed (for migraines and may repeat once in 2 hours, if no relief).      vitamin k 100 MCG tablet Take 100 mcg by mouth daily.     No current facility-administered medications for this visit.    Allergies as of 01/08/2021 - Review Complete 01/08/2021  Allergen Reaction Noted   Sulfa antibiotics Anaphylaxis, Shortness Of Breath, and Rash 07/20/2014    Coconut oil Rash 07/20/2014    Family History  Problem Relation Age of Onset   Colon cancer Other        Age of Onset-late 52's    Social History   Socioeconomic History   Marital status: Married    Spouse name: Not on file   Number of children: Not on file   Years of education: Not on file   Highest education level: Not on file  Occupational History   Not on file  Tobacco Use   Smoking status: Former    Packs/day: 0.00    Years: 30.00    Pack years: 0.00    Types: Cigars, Cigarettes   Smokeless tobacco: Never  Vaping Use   Vaping Use: Never used  Substance and Sexual Activity   Alcohol use: Yes    Comment: seldom    Drug use: No   Sexual activity: Not on file  Other Topics Concern   Not on file  Social History Narrative   Not on file   Social Determinants of Health   Financial Resource Strain: Not on file  Food Insecurity: Not on file  Transportation Needs: Not on file  Physical Activity: Not on file  Stress: Not on file  Social Connections: Not on file   Review of Systems: Gen: Denies fever, chills, anorexia. Denies fatigue, weakness, endorses 15 lbs wt loss over past year. CV: Denies chest pain, palpitations, syncope, peripheral edema, and claudication. Resp: Denies dyspnea at rest, cough, wheezing, coughing up blood, and pleurisy. GI: Denies vomiting blood, jaundice, and fecal incontinence. Denies dysphagia or odynophagia. Derm: Denies rash, itching, dry skin Psych: Denies depression, anxiety, memory loss, confusion. No homicidal or suicidal ideation.  Heme: Denies bruising, bleeding, and enlarged lymph nodes.  Physical Exam: BP 115/68 (BP Location: Right Arm, Patient Position: Sitting, Cuff Size: Large)   Pulse (!) 54   Temp 98.8 F (37.1 C) (Oral)   Ht '6\' 3"'$  (1.905 m)   Wt 194 lb 12.8 oz (88.4 kg)   BMI 24.35 kg/m  General:   Alert and oriented. No distress noted. Pleasant and cooperative.  Head:  Normocephalic and atraumatic. Eyes:  Conjuctiva  clear without scleral icterus. Mouth:  Oral mucosa pink and moist. Good dentition. No lesions. Heart: Normal rate and rhythm, s1 and s2 heart  sounds present.  Lungs: Clear lung sounds in all lobes. Respirations equal and unlabored. Abdomen:  +BS, soft, non-tender and non-distended. No rebound or guarding. No HSM or masses noted. Rectal: small internal hemorrhoids felt posteriorly. Sphincter tone WNL. Derm: No palmar erythema or jaundice Msk:  Symmetrical without gross deformities. Normal posture. Extremities:  Without edema. Neurologic:  Alert and  oriented x4 Psych:  Alert and cooperative. Normal mood and affect.  ASSESSMENT: Frank Weber is a 70 y.o. male presenting today for follow up of constipation. Patient also endorses some recent weight loss, early satiety, nausea and rectal pain.  Constipation improved after last visit in April when patient completed bowel prep, however, subsequent use of miralax thereafter was only done BID, patient did not increase to TID as recommended. States he ran out of miralax a while back. Recent episode of constipation without a BM for 4-5 days, took multiple doses of colace over a 2 day period without much relief. States that he started back on mirlax this weekend, only doing it twice daily. He is having associated lower abdominal cramping, flatus and some nausea at times without vomiting, related to his constipation. Discussed importance of consistency with miralax and recommended that patient go ahead and increase to TID given significant constipation he is experiencing. It is also important that he continues to drink plenty of water and increases fruits and veggies in his diet. Most recent TSH (11/01/20) and potassium (02/04/21) WNL.   Intermittent rectal pain described as sharp, like "a hot poker" stabbing him, wonders if he has some really hard stools pressing on hemorrhoids, most recent episode was this morning, reporting pain woke him up around 4am.  Patient had to take tylenol and attempted to have BM by straining on commode, pain finally subsided. He denies any rectal bleeding. Occasional pain from hemorrhoids after having BM where he has to strain, soaks in a hot tub which provides relief.   Patient has also had a 6 pound weight loss since April, reportedly about 15 lb weight loss over the past year. Appetite has been okay, but, he reports some early satiety over the past month. Denies dysphagia, melena, rectal bleeding or vomiting. Given patient's symptoms and most recent colonoscopy being 8 years ago, we should proceed with a colonoscopy and EGD at this time as PUD or malignancy cannot be excluded as etiology.  *Rectal exam was performed by this provider with Dr. Jenetta Downer present as witness.   PLAN:  Schedule EGD and colonoscopy with Dr. Jenetta Downer 2. Continue miralax, increase to TID, stay consistent with use to produce best results 3. Continue to drink plenty of water and increase fruits and veggies, especially kiwi and prunes  Indications, risks and benefits of procedure discussed in detail with patient. Patient verbalized understanding and is in agreement to proceed with EGD/Colonoscopy at this time.   Patient not currently anticoagulated, on antiplatelet therapy, Aspirin or antidiabetic agents.   Follow Up: 4 months  Case discussed with Dr. Jenetta Downer who is in agreement with plan of care, as outlined above.   Namya Voges L. Alver Sorrow, MSN, APRN, AGNP-C Adult-Gerontology Nurse Practitioner [facility]

## 2021-01-10 ENCOUNTER — Encounter (HOSPITAL_COMMUNITY): Payer: Self-pay | Admitting: *Deleted

## 2021-01-10 ENCOUNTER — Ambulatory Visit (HOSPITAL_COMMUNITY): Payer: Medicare Other | Admitting: Anesthesiology

## 2021-01-10 ENCOUNTER — Telehealth (INDEPENDENT_AMBULATORY_CARE_PROVIDER_SITE_OTHER): Payer: Self-pay | Admitting: Gastroenterology

## 2021-01-10 ENCOUNTER — Other Ambulatory Visit (HOSPITAL_COMMUNITY): Payer: Self-pay

## 2021-01-10 ENCOUNTER — Other Ambulatory Visit: Payer: Self-pay

## 2021-01-10 ENCOUNTER — Emergency Department (HOSPITAL_COMMUNITY): Payer: Medicare Other

## 2021-01-10 ENCOUNTER — Ambulatory Visit (HOSPITAL_BASED_OUTPATIENT_CLINIC_OR_DEPARTMENT_OTHER)
Admission: RE | Admit: 2021-01-10 | Discharge: 2021-01-10 | Disposition: A | Discharge status: Home / Self Care | Payer: Medicare Other | Source: Home / Self Care | Attending: Gastroenterology | Admitting: Gastroenterology

## 2021-01-10 ENCOUNTER — Observation Stay (HOSPITAL_COMMUNITY)
Admission: EM | Admit: 2021-01-10 | Discharge: 2021-01-11 | Disposition: A | Discharge status: Home / Self Care | Payer: Medicare Other | Attending: Internal Medicine | Admitting: Internal Medicine

## 2021-01-10 ENCOUNTER — Encounter (HOSPITAL_COMMUNITY)
Admission: RE | Disposition: A | Discharge status: Home / Self Care | Payer: Self-pay | Source: Home / Self Care | Attending: Gastroenterology

## 2021-01-10 ENCOUNTER — Encounter (HOSPITAL_COMMUNITY): Payer: Self-pay | Admitting: Gastroenterology

## 2021-01-10 DIAGNOSIS — Z79899 Other long term (current) drug therapy: Secondary | ICD-10-CM | POA: Insufficient documentation

## 2021-01-10 DIAGNOSIS — K6289 Other specified diseases of anus and rectum: Principal | ICD-10-CM | POA: Insufficient documentation

## 2021-01-10 DIAGNOSIS — R6881 Early satiety: Secondary | ICD-10-CM

## 2021-01-10 DIAGNOSIS — R634 Abnormal weight loss: Secondary | ICD-10-CM

## 2021-01-10 DIAGNOSIS — D124 Benign neoplasm of descending colon: Secondary | ICD-10-CM | POA: Insufficient documentation

## 2021-01-10 DIAGNOSIS — Z87891 Personal history of nicotine dependence: Secondary | ICD-10-CM | POA: Insufficient documentation

## 2021-01-10 DIAGNOSIS — K5730 Diverticulosis of large intestine without perforation or abscess without bleeding: Secondary | ICD-10-CM | POA: Insufficient documentation

## 2021-01-10 DIAGNOSIS — D123 Benign neoplasm of transverse colon: Secondary | ICD-10-CM

## 2021-01-10 DIAGNOSIS — Z79890 Hormone replacement therapy (postmenopausal): Secondary | ICD-10-CM | POA: Insufficient documentation

## 2021-01-10 DIAGNOSIS — R079 Chest pain, unspecified: Secondary | ICD-10-CM | POA: Diagnosis not present

## 2021-01-10 DIAGNOSIS — Z96651 Presence of right artificial knee joint: Secondary | ICD-10-CM | POA: Diagnosis not present

## 2021-01-10 DIAGNOSIS — Z7982 Long term (current) use of aspirin: Secondary | ICD-10-CM | POA: Insufficient documentation

## 2021-01-10 DIAGNOSIS — Z800 Family history of malignant neoplasm of digestive organs: Secondary | ICD-10-CM | POA: Insufficient documentation

## 2021-01-10 DIAGNOSIS — Z6824 Body mass index (BMI) 24.0-24.9, adult: Secondary | ICD-10-CM | POA: Insufficient documentation

## 2021-01-10 DIAGNOSIS — E039 Hypothyroidism, unspecified: Secondary | ICD-10-CM | POA: Diagnosis not present

## 2021-01-10 DIAGNOSIS — K3189 Other diseases of stomach and duodenum: Secondary | ICD-10-CM

## 2021-01-10 DIAGNOSIS — K219 Gastro-esophageal reflux disease without esophagitis: Secondary | ICD-10-CM | POA: Diagnosis not present

## 2021-01-10 DIAGNOSIS — N182 Chronic kidney disease, stage 2 (mild): Secondary | ICD-10-CM | POA: Diagnosis not present

## 2021-01-10 DIAGNOSIS — K648 Other hemorrhoids: Secondary | ICD-10-CM | POA: Insufficient documentation

## 2021-01-10 DIAGNOSIS — K5900 Constipation, unspecified: Secondary | ICD-10-CM | POA: Insufficient documentation

## 2021-01-10 DIAGNOSIS — R52 Pain, unspecified: Secondary | ICD-10-CM

## 2021-01-10 DIAGNOSIS — E785 Hyperlipidemia, unspecified: Secondary | ICD-10-CM | POA: Insufficient documentation

## 2021-01-10 DIAGNOSIS — Z20822 Contact with and (suspected) exposure to covid-19: Secondary | ICD-10-CM | POA: Diagnosis not present

## 2021-01-10 DIAGNOSIS — K2289 Other specified disease of esophagus: Secondary | ICD-10-CM

## 2021-01-10 DIAGNOSIS — Z882 Allergy status to sulfonamides status: Secondary | ICD-10-CM | POA: Insufficient documentation

## 2021-01-10 DIAGNOSIS — R0789 Other chest pain: Secondary | ICD-10-CM | POA: Insufficient documentation

## 2021-01-10 DIAGNOSIS — D122 Benign neoplasm of ascending colon: Secondary | ICD-10-CM | POA: Insufficient documentation

## 2021-01-10 HISTORY — PX: BIOPSY: SHX5522

## 2021-01-10 HISTORY — PX: POLYPECTOMY: SHX5525

## 2021-01-10 HISTORY — PX: ESOPHAGOGASTRODUODENOSCOPY (EGD) WITH PROPOFOL: SHX5813

## 2021-01-10 HISTORY — PX: COLONOSCOPY WITH PROPOFOL: SHX5780

## 2021-01-10 LAB — BASIC METABOLIC PANEL
Anion gap: 8 (ref 5–15)
BUN: 11 mg/dL (ref 8–23)
CO2: 27 mmol/L (ref 22–32)
Calcium: 9.7 mg/dL (ref 8.9–10.3)
Chloride: 105 mmol/L (ref 98–111)
Creatinine, Ser: 1.27 mg/dL — ABNORMAL HIGH (ref 0.61–1.24)
GFR, Estimated: >60 mL/min (ref >60)
Glucose, Bld: 116 mg/dL — ABNORMAL HIGH (ref 70–99)
Potassium: 4.1 mmol/L (ref 3.5–5.1)
Sodium: 140 mmol/L (ref 135–145)

## 2021-01-10 LAB — TROPONIN I (HIGH SENSITIVITY)
Troponin I (High Sensitivity): 4 ng/L (ref <18)
Troponin I (High Sensitivity): 4 ng/L (ref <18)
Troponin I (High Sensitivity): 3 ng/L (ref <18)

## 2021-01-10 LAB — CBC
HCT: 46.1 % (ref 39.0–52.0)
Hemoglobin: 15 g/dL (ref 13.0–17.0)
MCH: 32.3 pg (ref 26.0–34.0)
MCHC: 32.5 g/dL (ref 30.0–36.0)
MCV: 99.1 fL (ref 80.0–100.0)
Platelets: 124 10*3/uL — ABNORMAL LOW (ref 150–400)
RBC: 4.65 MIL/uL (ref 4.22–5.81)
RDW: 12.8 % (ref 11.5–15.5)
WBC: 5.1 10*3/uL (ref 4.0–10.5)
nRBC: 0 % (ref 0.0–0.2)

## 2021-01-10 LAB — HM COLONOSCOPY

## 2021-01-10 SURGERY — ESOPHAGOGASTRODUODENOSCOPY (EGD) WITH PROPOFOL
Anesthesia: General

## 2021-01-10 MED ORDER — ALPRAZOLAM 0.25 MG PO TABS
0.2500 mg | ORAL_TABLET | Freq: Four times a day (QID) | ORAL | Status: DC | PRN
  Administered 2021-01-10: 0.25 mg via ORAL
  Filled 2021-01-10: qty 1

## 2021-01-10 MED ORDER — RIZATRIPTAN BENZOATE 10 MG PO TBDP: 10.0000 mg | ORAL_TABLET | ORAL | Status: DC | PRN

## 2021-01-10 MED ORDER — STERILE WATER FOR IRRIGATION IR SOLN
Status: DC | PRN
  Administered 2021-01-10: 200 mL

## 2021-01-10 MED ORDER — ONDANSETRON HCL 4 MG/2ML IJ SOLN
4.0000 mg | Freq: Four times a day (QID) | INTRAMUSCULAR | Status: DC | PRN
  Administered 2021-01-11 (×2): 4 mg via INTRAVENOUS
  Filled 2021-01-10 (×2): qty 2

## 2021-01-10 MED ORDER — EPHEDRINE SULFATE 50 MG/ML IJ SOLN
INTRAMUSCULAR | Status: DC | PRN
Start: 2021-01-10 — End: 2021-01-10
  Administered 2021-01-10: 5 mg via INTRAVENOUS

## 2021-01-10 MED ORDER — ALPRAZOLAM ER 1 MG PO TB24: 1.0000 mg | ORAL_TABLET | Freq: Every day | ORAL | Status: DC

## 2021-01-10 MED ORDER — ENOXAPARIN SODIUM 40 MG/0.4ML IJ SOSY
40.0000 mg | PREFILLED_SYRINGE | INTRAMUSCULAR | Status: DC
  Administered 2021-01-10: 40 mg via SUBCUTANEOUS
  Filled 2021-01-10: qty 0.4

## 2021-01-10 MED ORDER — HYDROMORPHONE HCL 1 MG/ML IJ SOLN
0.5000 mg | INTRAMUSCULAR | Status: DC | PRN
Start: 2021-01-10 — End: 2021-01-11
  Administered 2021-01-10 – 2021-01-11 (×4): 0.5 mg via INTRAVENOUS
  Filled 2021-01-10: qty 0.5
  Filled 2021-01-10: qty 1
  Filled 2021-01-10 (×3): qty 0.5

## 2021-01-10 MED ORDER — LIDOCAINE HCL (CARDIAC) PF 100 MG/5ML IV SOSY
PREFILLED_SYRINGE | INTRAVENOUS | Status: DC | PRN
  Administered 2021-01-10: 80 mg via INTRAVENOUS

## 2021-01-10 MED ORDER — ONDANSETRON HCL 4 MG/2ML IJ SOLN
INTRAMUSCULAR | Status: AC
  Filled 2021-01-10: qty 2

## 2021-01-10 MED ORDER — VITAMIN D 25 MCG (1000 UNIT) PO TABS
5000.0000 [IU] | ORAL_TABLET | Freq: Every day | ORAL | Status: DC
  Administered 2021-01-10 – 2021-01-11 (×2): 5000 [IU] via ORAL
  Filled 2021-01-10 (×2): qty 5

## 2021-01-10 MED ORDER — GABAPENTIN 300 MG PO CAPS
900.0000 mg | ORAL_CAPSULE | Freq: Four times a day (QID) | ORAL | Status: DC
  Administered 2021-01-10 – 2021-01-11 (×2): 900 mg via ORAL
  Filled 2021-01-10 (×2): qty 3

## 2021-01-10 MED ORDER — OXYCODONE HCL 5 MG PO TABS
5.0000 mg | ORAL_TABLET | ORAL | Status: AC | PRN
  Administered 2021-01-11 (×2): 5 mg via ORAL
  Filled 2021-01-10 (×2): qty 1

## 2021-01-10 MED ORDER — PROPOFOL 10 MG/ML IV BOLUS
INTRAVENOUS | Status: DC | PRN
  Administered 2021-01-10: 125 ug/kg/min via INTRAVENOUS
  Administered 2021-01-10: 80 mg via INTRAVENOUS

## 2021-01-10 MED ORDER — ONDANSETRON HCL 4 MG/2ML IJ SOLN
4.0000 mg | Freq: Once | INTRAMUSCULAR | Status: AC
  Administered 2021-01-10: 4 mg via INTRAVENOUS
  Filled 2021-01-10: qty 2

## 2021-01-10 MED ORDER — LACTATED RINGERS IV SOLN
INTRAVENOUS | Status: DC
  Administered 2021-01-10: 1000 mL via INTRAVENOUS

## 2021-01-10 MED ORDER — ONDANSETRON HCL 4 MG/2ML IJ SOLN
4.0000 mg | Freq: Once | INTRAMUSCULAR | Status: AC
  Administered 2021-01-10: 4 mg via INTRAVENOUS

## 2021-01-10 MED ORDER — CALCIUM CARBONATE ANTACID 500 MG PO CHEW
2000.0000 mg | CHEWABLE_TABLET | Freq: Every day | ORAL | Status: DC
  Administered 2021-01-10 – 2021-01-11 (×2): 2000 mg via ORAL
  Filled 2021-01-10 (×2): qty 10

## 2021-01-10 MED ORDER — ASPIRIN 325 MG PO TABS
325.0000 mg | ORAL_TABLET | Freq: Once | ORAL | Status: AC
  Administered 2021-01-10: 325 mg via ORAL
  Filled 2021-01-10: qty 1

## 2021-01-10 MED ORDER — ACETAMINOPHEN 325 MG PO TABS: 650.0000 mg | ORAL_TABLET | ORAL | Status: DC | PRN

## 2021-01-10 MED ORDER — ATORVASTATIN CALCIUM 20 MG PO TABS
20.0000 mg | ORAL_TABLET | Freq: Every day | ORAL | Status: DC
  Administered 2021-01-10 – 2021-01-11 (×2): 20 mg via ORAL
  Filled 2021-01-10 (×2): qty 1

## 2021-01-10 MED ORDER — ASPIRIN EC 81 MG PO TBEC
81.0000 mg | DELAYED_RELEASE_TABLET | Freq: Every day | ORAL | Status: DC
  Administered 2021-01-10 – 2021-01-11 (×2): 81 mg via ORAL
  Filled 2021-01-10 (×2): qty 1

## 2021-01-10 MED ORDER — LEVOTHYROXINE SODIUM 25 MCG PO TABS
25.0000 ug | ORAL_TABLET | Freq: Every day | ORAL | Status: DC
  Administered 2021-01-11: 25 ug via ORAL
  Filled 2021-01-10: qty 1

## 2021-01-10 MED ORDER — LACTATED RINGERS IV SOLN: INTRAVENOUS | Status: DC

## 2021-01-10 MED ORDER — SODIUM CHLORIDE 0.9 % IV BOLUS
1000.0000 mL | Freq: Once | INTRAVENOUS | Status: AC
Start: 2021-01-10 — End: 2021-01-10
  Administered 2021-01-10: 1000 mL via INTRAVENOUS

## 2021-01-10 NOTE — Op Note (Signed)
[facility] Patient Name: Frank Weber Procedure Date: 01/10/2021 9:41 AM MRN: [id number] Date of Birth: 16-Oct-1950 Attending MD: Maylon Peppers ,  CSN: [id number] Age: 70 Admit Type: Outpatient Procedure:                Colonoscopy Indications:              Rectal pain Providers:                Maylon Peppers, Caprice Kluver, Aram Candela Referring MD:              Medicines:                Monitored Anesthesia Care Complications:            No immediate complications. Estimated Blood Loss:     Estimated blood loss: none. Procedure:                Pre-Anesthesia Assessment:                           - Prior to the procedure, a History and Physical                            was performed, and patient medications, allergies                            and sensitivities were reviewed. The patient's                            tolerance of previous anesthesia was reviewed.                           - The risks and benefits of the procedure and the                            sedation options and risks were discussed with the                            patient. All questions were answered and informed                            consent was obtained.                           - ASA Grade Assessment: II - A patient with mild                            systemic disease.                           After obtaining informed consent, the colonoscope                            was passed under direct vision. Throughout the                            procedure, the patient's blood pressure, pulse, and  oxygen saturations were monitored continuously. The                            [id number]) scope was introduced through                            the anus and advanced to the the cecum, identified                            by appendiceal orifice and ileocecal valve. The                            colonoscopy was performed without difficulty. The                             patient tolerated the procedure well. The quality                            of the bowel preparation was adequate. Scope In: 9:44:17 AM Scope Out: 10:34:36 AM Scope Withdrawal Time: 0 hours 33 minutes 13 seconds  Total Procedure Duration: 0 hours 50 minutes 19 seconds  Findings:      The perianal and digital rectal examinations were normal.      Two sessile polyps were found in the ascending colon. The polyps were 2       mm in size. These polyps were removed with a cold biopsy forceps.       Resection and retrieval were complete.      Nine sessile polyps were found in the descending colon, transverse colon       and ascending colon. The polyps were 3 to 8 mm in size. These polyps       were removed with a cold snare. Resection and retrieval were complete.       These polyps were removed with a cold snare. Resection and retrieval       were complete.      Multiple small and large-mouthed diverticula were found in the sigmoid       colon and descending colon.      Non-bleeding internal hemorrhoids were found during retroflexion. Impression:               - Two 2 mm polyps in the ascending colon, removed                            with a cold biopsy forceps. Resected and retrieved.                           - Nine 3 to 8 mm polyps in the descending colon, in                            the transverse colon and in the ascending colon,                            removed with a cold snare. Resected and retrieved.                           -  Diverticulosis in the sigmoid colon and in the                            descending colon.                           - Non-bleeding internal hemorrhoids. Moderate Sedation:      Per Anesthesia Care Recommendation:           - Discharge patient to home (ambulatory).                           - Resume previous diet.                           - Await pathology results.                           - Repeat colonoscopy for surveillance based on                             pathology results.                           - Take Miralax compliantly. Procedure Code(s):        --- Professional ---                           [id number], Colonoscopy, flexible; with removal of                            tumor(s), polyp(s), or other lesion(s) by snare                            technique                           45380, 31, Colonoscopy, flexible; with biopsy,                            single or multiple Diagnosis Code(s):        --- Professional ---                           K63.5, Polyp of colon                           K64.8, Other hemorrhoids                           K62.89, Other specified diseases of anus and rectum                           K57.30, Diverticulosis of large intestine without                            perforation or abscess without bleeding CPT copyright 2019 American Medical Association. All rights reserved. The codes documented in this report are  preliminary and upon coder review may  be revised to meet current compliance requirements. Maylon Peppers, MD Maylon Peppers,  01/10/2021 10:42:16 AM This report has been signed electronically. Number of Addenda: 0

## 2021-01-10 NOTE — Anesthesia Preprocedure Evaluation (Addendum)
Anesthesia Evaluation  Patient identified by MRN, date of birth, ID band Patient awake    Reviewed: Allergy & Precautions, NPO status , Patient's Chart, lab work & pertinent test results  Airway Mallampati: II  TM Distance: >3 FB Neck ROM: Full    Dental  (+) Dental Advisory Given, Missing   Pulmonary neg pulmonary ROS, former smoker,    Pulmonary exam normal breath sounds clear to auscultation       Cardiovascular Exercise Tolerance: Good Normal cardiovascular exam+ Valvular Problems/Murmurs  Rhythm:Regular Rate:Normal     Neuro/Psych  Headaches,  Neuromuscular disease negative psych ROS   GI/Hepatic Neg liver ROS, GERD  Medicated,  Endo/Other  Hypothyroidism   Renal/GU negative Renal ROS  negative genitourinary   Musculoskeletal  (+) Arthritis ,   Abdominal   Peds negative pediatric ROS (+)  Hematology negative hematology ROS (+)   Anesthesia Other Findings   Reproductive/Obstetrics negative OB ROS                            Anesthesia Physical Anesthesia Plan  ASA: 2  Anesthesia Plan: General   Post-op Pain Management:    Induction: Intravenous  PONV Risk Score and Plan: TIVA  Airway Management Planned: Nasal Cannula and Natural Airway  Additional Equipment:   Intra-op Plan:   Post-operative Plan:   Informed Consent: I have reviewed the patients History and Physical, chart, labs and discussed the procedure including the risks, benefits and alternatives for the proposed anesthesia with the patient or authorized representative who has indicated his/her understanding and acceptance.     Dental advisory given  Plan Discussed with: CRNA and Surgeon  Anesthesia Plan Comments:         Anesthesia Quick Evaluation

## 2021-01-10 NOTE — H&P (Signed)
90 History and Physical  Frank Weber [id number] DOB: 1951/02/17 DOA: 01/10/2021  Referring physician: Berton Lan  PCP: [name], MD  Outpatient Specialists: Cardiology Patient coming from: Home   Chief Complaint: Chest pain   HPI: Frank Weber is a 70 y.o. male with medical history significant for former tobacco use (quit a month ago)  polyneuropathy, GERD, post EGD and colonoscopy on 01/10/2021 who presented to [facility] ED due to chest pain after he returned home from endoscopy suite.  No known complication from his endoscopies done today.  He went home, ate, drank coffee and ate a piece of toast then had sudden onset left-sided chest pain pressure-like 10 out of 10, nonexertional, radiated to his left arm and left jaw lasting over 3 hours.  He received a full dose of aspirin in the ED.  Due to low BPs patient did not receive sublingual nitroglycerin.  2 sets of troponin came back negative.  No evidence of acute ischemia on twelve-lead EKG.  Chest x-ray nonacute.  At the time of this visit his chest pain was about a 3 out of 10.  Then later became more severe.  Troponin and twelve-lead EKG reordered.  2D echo ordered and pending.  Cardiology will be consulted in the morning by day team.  Admitted to the hospitalist service.  ED Course: Temperature 97.9.  BP 117/69, pulse 55, respiration rate 16, O2 saturation 100% on room air.  Lab studies remarkable for troponin 4, repeated 4.  Creatinine 1.27 with GFR greater than 60, baseline.  CBC essentially unremarkable except for platelet count of 124.  Chest x-ray nonacute.  Review of Systems: Review of systems as noted in the HPI. All other systems reviewed and are negative.   Past Medical History:  Diagnosis Date   Arthritis    right knee   Constipation    Frequency of urination    GERD (gastroesophageal reflux disease)    Heart murmur    "slight"    History of kidney stones    History of melanoma excision    2012--  NECK/  HEAD   History of squamous cell carcinoma excision    RIGHT EAR   Hyperlipidemia    Hypothyroidism    IBS (irritable bowel syndrome)    states slight   Lumbar stenosis    L5 - S1   Migraines    Peripheral neuropathy    legs and feet   Right flank pain    Right ureteral stone    Urgency of urination    Wears glasses    Past Surgical History:  Procedure Laterality Date   CARDIAC CATHETERIZATION  07-08-2007  dr Tami Ribas    no significant CAD, preserved LVF, ef 50%//  30% pLAD   CARDIOVASCULAR STRESS TEST  06-20-2011  dr croitoru   Low risk study/  normal perfusion scan showing attenuation artifact in the anterior region of myocardium with no ischemia , infarct or scar/  normal LV funciton and wall motion , ef 62%   COLONOSCOPY N/A 02/17/2013   Rehman:examination performed to cecum. mild sigmoid colon diverticulosis, 4 small polyps ablated via cold biopsy from rectosigmoid junction (hyperplastic). External hemorrhoids.   CYSTOSCOPY W/ URETERAL STENT PLACEMENT Right 06/18/2015   Procedure: CYSTOSCOPY WITH RIGHT RETROGRADE PYELOGRAM RIGHT URETERAL STENT PLACEMENT;  Surgeon: [name], MD;  Location: [facility];  Service: Urology;  Laterality: Right;   CYSTOSCOPY/URETEROSCOPY/HOLMIUM LASER/STENT PLACEMENT Right 06/27/2015   Procedure: CYSTOSCOPY RIGHT URETEROSCOPY  STENT REMOVAL; STONE EXTRACTION WITH BASKET;  Surgeon: [name], MD;  Location: [facility];  Service: Urology;  Laterality: Right;   HEAD & NECK SKIN LESION EXCISIONAL BIOPSY     HOLMIUM LASER APPLICATION Right [id number]   Procedure: HOLMIUM LASER LITHOTRIPSY ;  Surgeon: [name], MD;  Location: [facility];  Service: Urology;  Laterality: Right;   I & D EXTREMITY Right 07/14/2018   Procedure: IRRIGATION AND DEBRIDEMENT RIGHT KNEE WITH CLOSURE;  Surgeon: Nicholes Stairs, MD;  Location: [place];  Service: Orthopedics;  Laterality: Right;   INGUINAL HERNIA REPAIR Left 10/21/2007   KNEE  ARTHROSCOPY W/ ACL RECONSTRUCTION Right 1992   MENISCUS REPAIR Right    2019, dr Theda Sers    SHOULDER ARTHROSCOPY WITH OPEN ROTATOR CUFF REPAIR Left 07/27/2014   Procedure: LEFT SHOULDER ARTHROSCOPY WITH MINI OPEN ROTATOR CUFF REPAIR;  Surgeon: [name], MD;  Location: [place];  Service: Orthopedics;  Laterality: Left;   SHOULDER ARTHROSCOPY WITH OPEN ROTATOR CUFF REPAIR Right 2006   TONSILLECTOMY AND ADENOIDECTOMY  age 70   TOTAL KNEE ARTHROPLASTY Right 07/10/2018   Procedure: TOTAL KNEE ARTHROPLASTY;  Surgeon: [name], MD;  Location: [facility];  Service: Orthopedics;  Laterality: Right;   TRANSTHORACIC ECHOCARDIOGRAM  07/30/2007   normal LVF, ef >55%/  mild AR, MR , PR and TR/  mild AV sclerosis without stenosis/    URETEROSCOPY Right 06/18/2015   Procedure: URETEROSCOPY;  Surgeon: [name], MD;  Location: [facility];  Service: Urology;  Laterality: Right;    Social History:  reports that he has quit smoking. His smoking use included cigars and cigarettes. He has never used smokeless tobacco. He reports current alcohol use. He reports that he does not use drugs.   Allergies  Allergen Reactions   Sulfa Antibiotics Anaphylaxis, Shortness Of Breath and Rash   Coconut Oil Rash    Family History  Problem Relation Age of Onset   Colon cancer Other        Age of Onset-late 70's      Prior to Admission medications   Medication Sig Start Date End Date Taking? Authorizing Provider  ALPRAZOLAM XR 1 MG 24 hr tablet Take 1 mg by mouth at bedtime. 11/17/19  Yes [provider]  aspirin EC 81 MG tablet Take 1 tablet (81 mg total) by mouth daily. Swallow whole. 12/24/19  Yes Josue Hector, MD  atorvastatin (LIPITOR) 20 MG tablet Take 20 mg by mouth daily. 11/09/19  Yes [provider]  benzocaine (ANBESOL) 10 % mucosal gel Use as directed 1 application in the mouth or throat as needed for mouth pain.   Yes [provider]  calcium elemental as  carbonate (TUMS ULTRA 1000) 400 MG chewable tablet Chew 2,000 mg by mouth daily as needed for heartburn.   Yes [provider]  Cholecalciferol (DIALYVITE VITAMIN D 5000) 125 MCG (5000 UT) capsule Take 5,000 Units by mouth daily.   Yes [provider]  gabapentin (NEURONTIN) 300 MG capsule Take 900 mg by mouth 4 (four) times daily.   Yes [provider]  levothyroxine (SYNTHROID, LEVOTHROID) 25 MCG tablet Take 25 mcg by mouth daily before breakfast.   Yes [provider]  Lidocaine HCl 4 % CREA Apply 1 application topically daily as needed (pain).   Yes [provider]  LORazepam (ATIVAN) 0.5 MG tablet Take 0.5 mg by mouth 3 (three) times daily as needed for anxiety.   Yes [provider]  nitroGLYCERIN (NITROSTAT) 0.4  MG SL tablet Place 1 tablet (0.4 mg total) under the tongue every 5 (five) minutes as needed. 12/24/19  Yes Josue Hector, MD  Polyethyl Glycol-Propyl Glycol (SYSTANE ULTRA) 0.4-0.3 % SOLN Place 2 drops into both eyes as needed (for dry eyes).    Yes [provider]  polyethylene glycol (MIRALAX / GLYCOLAX) 17 g packet Take 17 g by mouth 2 (two) times daily.   Yes [provider]  Probiotic Product (TRUBIOTICS PO) Take 1 capsule by mouth at bedtime.   Yes [provider]  rizatriptan (MAXALT-MLT) 10 MG disintegrating tablet Take 10 mg by mouth as needed (for migraines and may repeat once in 2 hours, if no relief).    Yes [provider]  vitamin k 100 MCG tablet Take 100 mcg by mouth daily.   Yes [provider]    Physical Exam: BP 117/69   Pulse (!) 55   Temp 97.9 F (36.6 C) (Oral)   Resp 16   Ht '6\' 3"'$  (1.905 m)   Wt 88 kg   SpO2 100%   BMI 24.25 kg/m   General: 70 y.o. year-old male well developed well nourished in no acute distress.  Alert and oriented x3. Cardiovascular: Regular rate and rhythm with no rubs or gallops.  No thyromegaly or JVD noted.  No lower extremity  edema. 2/4 pulses in all 4 extremities. Respiratory: Clear to auscultation with no wheezes or rales. Good inspiratory effort. Abdomen: Soft nontender nondistended with normal bowel sounds x4 quadrants. Muskuloskeletal: No cyanosis, clubbing or edema noted bilaterally Neuro: CN II-XII intact, strength, sensation, reflexes Skin: No ulcerative lesions noted or rashes Psychiatry: Judgement and insight appear normal. Mood is appropriate for condition and setting          Labs on Admission:  Basic Metabolic Panel: Recent Labs  Lab 01/10/21 1425  NA 140  K 4.1  CL 105  CO2 27  GLUCOSE 116*  BUN 11  CREATININE 1.27*  CALCIUM 9.7   Liver Function Tests: No results for input(s): AST, ALT, ALKPHOS, BILITOT, PROT, ALBUMIN in the last 168 hours. No results for input(s): LIPASE, AMYLASE in the last 168 hours. No results for input(s): AMMONIA in the last 168 hours. CBC: Recent Labs  Lab 01/10/21 1425  WBC 5.1  HGB 15.0  HCT 46.1  MCV 99.1  PLT 124*   Cardiac Enzymes: No results for input(s): CKTOTAL, CKMB, CKMBINDEX, TROPONINI in the last 168 hours.  BNP (last 3 results) No results for input(s): BNP in the last 8760 hours.  ProBNP (last 3 results) No results for input(s): PROBNP in the last 8760 hours.  CBG: No results for input(s): GLUCAP in the last 168 hours.  Radiological Exams on Admission: DG Chest 2 View  Result Date: 01/10/2021 CLINICAL DATA:  Colonoscopy today with subsequent chest pain. EXAM: CHEST - 2 VIEW COMPARISON:  11/02/2019 FINDINGS: Heart and mediastinal shadows are normal except for mild aortic atherosclerotic calcification. The lungs are clear. The vascularity is normal. No free air seen under the diaphragm. No significant bone finding. IMPRESSION: No active cardiopulmonary disease. Electronically Signed   By: Nelson Chimes M.D.   On: 01/10/2021 14:38    EKG: I independently viewed the EKG done and my findings are as followed: Sinus rhythm rate of 56.   Nonspecific ST-T changes.  QTc 396.  Assessment/Plan Present on Admission:  Chest pain  Active Problems:   Chest pain  Chest pain rule out ACS. Heart score 3 Received a dose  of full aspirin Did not receive nitroglycerin due to soft BPs to avoid hypotension. Resume home aspirin and statin therapy IV Dilaudid as needed for severe pain Troponin negative x2 No evidence of acute ischemia on 12 EKG Repeat of troponin due to recurrence of severe chest pain Chest x-ray non acute Monitor on telemetry 2D echo ordered and pending Lipid panel in the morning Please consult cardiology in the morning.  Polyneuropathy On maximum doses of gabapentin prior to admission Resume home regimen to avoid withdrawal  History of migraines headache Reports having headache at this time, states it does not feel like his migraine Tylenol as needed  Post EGD and colonoscopy on 01/10/2021 Outpatient follow-up with GI.  CKD 2 Appears to be at his baseline creatinine 1.2 with GFR greater than 60 Avoid nephrotoxic agents/dehydration/hypotension Gentle IV fluid hydration LR 50 cc/h x 1 day.  Hypothyroidism Resume home levothyroxine. Obtain TSH in the morning due to slight bradycardia.   DVT prophylaxis: Subcu Lovenox daily  Code Status: Full code  Family Communication: None at bedside  Disposition Plan: Admit to telemetry  Consults called: Please consult cardiology in the morning  Admission status: Observation status.   Status is: Observation    Dispo:  Patient From: Home  Planned Disposition: Home, possibly on 01/11/2021 or when cardiology signs off.  Medically stable for discharge: No      Kayleen Memos MD [facility] Pager [phone]  If 7PM-7AM, please contact night-coverage [email or web address] Password [facility]  01/10/2021, 9:34 PM

## 2021-01-10 NOTE — ED Provider Notes (Signed)
[facility] EMERGENCY DEPARTMENT Provider Note   CSN: [id number] Arrival date & time: 01/10/21  1339     History Chief Complaint  Patient presents with   Chest Pain    Frank Weber is a 70 y.o. male.   Chest Pain Associated symptoms: nausea   Associated symptoms: no abdominal pain, no cough, no diaphoresis, no dizziness, no fever, no headache, no numbness, no shortness of breath, no vomiting and no weakness       Frank Weber is a 70 y.o. male with past medical history of hypothyroidism, heart murmur, peripheral neuropathy who presents to the Emergency Department complaining of sudden onset of left chest, left arm and left jaw pain.  He underwent a scheduled colonoscopy this morning that was completed without complication.  Upon returning home, he ate a piece of toast and drink some coffee and shortly after began having a sudden pain to his central chest that radiated into his left upper chest and down his left arm.  Pain was also located in his left jaw.  Symptoms began at noon and pain lasted approximately 3 hours.  Pain has somewhat improved upon arrival.  He still complains of having "tingling" to his left jaw and left hand.  No history of significant coronary artery disease.  He did have a cardiac catheterization in 2016 and an echocardiogram last July.  He denies any diaphoresis or vomiting.  Past Medical History:  Diagnosis Date   Arthritis    right knee   Constipation    Frequency of urination    GERD (gastroesophageal reflux disease)    Heart murmur    "slight"    History of kidney stones    History of melanoma excision    2012--  NECK/ HEAD   History of squamous cell carcinoma excision    RIGHT EAR   Hyperlipidemia    Hypothyroidism    IBS (irritable bowel syndrome)    states slight   Lumbar stenosis    L5 - S1   Migraines    Peripheral neuropathy    legs and feet   Right flank pain    Right ureteral stone    Urgency of urination    Wears glasses      Patient Active Problem List   Diagnosis Date Noted   Rectal pain 01/08/2021   Early satiety 01/08/2021   Loss of weight 01/08/2021   Constipation 01/08/2021   Lung nodule, multiple 11/02/2019   Open wound of right knee 07/14/2018   S/P knee replacement 07/10/2018   Right ureteral stone 06/18/2015   Unspecified hereditary and idiopathic peripheral neuropathy 01/27/2013   IBS (irritable bowel syndrome) 01/27/2013   GERD (gastroesophageal reflux disease) 01/27/2013   History of migraine headaches 01/27/2013    Past Surgical History:  Procedure Laterality Date   CARDIAC CATHETERIZATION  07-08-2007  dr Tami Ribas    no significant CAD, preserved LVF, ef 50%//  30% pLAD   CARDIOVASCULAR STRESS TEST  06-20-2011  dr croitoru   Low risk study/  normal perfusion scan showing attenuation artifact in the anterior region of myocardium with no ischemia , infarct or scar/  normal LV funciton and wall motion , ef 62%   COLONOSCOPY N/A 02/17/2013   Rehman:examination performed to cecum. mild sigmoid colon diverticulosis, 4 small polyps ablated via cold biopsy from rectosigmoid junction (hyperplastic). External hemorrhoids.   CYSTOSCOPY W/ URETERAL STENT PLACEMENT Right 06/18/2015   Procedure: CYSTOSCOPY WITH RIGHT RETROGRADE PYELOGRAM RIGHT URETERAL STENT PLACEMENT;  Surgeon: Jenny Reichmann  Jeffie Pollock, MD;  Location: [facility];  Service: Urology;  Laterality: Right;   CYSTOSCOPY/URETEROSCOPY/HOLMIUM LASER/STENT PLACEMENT Right 06/27/2015   Procedure: CYSTOSCOPY RIGHT URETEROSCOPY  STENT REMOVAL; STONE EXTRACTION WITH BASKET;  Surgeon: [name], MD;  Location: [facility];  Service: Urology;  Laterality: Right;   HEAD & NECK SKIN LESION EXCISIONAL BIOPSY     HOLMIUM LASER APPLICATION Right Q000111Q   Procedure: HOLMIUM LASER LITHOTRIPSY ;  Surgeon: [name], MD;  Location: [facility];  Service: Urology;  Laterality: Right;   I & D EXTREMITY Right 07/14/2018   Procedure: IRRIGATION  AND DEBRIDEMENT RIGHT KNEE WITH CLOSURE;  Surgeon: Nicholes Stairs, MD;  Location: [place];  Service: Orthopedics;  Laterality: Right;   INGUINAL HERNIA REPAIR Left 10/21/2007   KNEE ARTHROSCOPY W/ ACL RECONSTRUCTION Right 1992   MENISCUS REPAIR Right    2019, dr Theda Sers    SHOULDER ARTHROSCOPY WITH OPEN ROTATOR CUFF REPAIR Left 07/27/2014   Procedure: LEFT SHOULDER ARTHROSCOPY WITH MINI OPEN ROTATOR CUFF REPAIR;  Surgeon: [name], MD;  Location: [place];  Service: Orthopedics;  Laterality: Left;   SHOULDER ARTHROSCOPY WITH OPEN ROTATOR CUFF REPAIR Right 2006   TONSILLECTOMY AND ADENOIDECTOMY  age 39   TOTAL KNEE ARTHROPLASTY Right 07/10/2018   Procedure: TOTAL KNEE ARTHROPLASTY;  Surgeon: [name], MD;  Location: [facility];  Service: Orthopedics;  Laterality: Right;   TRANSTHORACIC ECHOCARDIOGRAM  07/30/2007   normal LVF, ef >55%/  mild AR, MR , PR and TR/  mild AV sclerosis without stenosis/    URETEROSCOPY Right 06/18/2015   Procedure: URETEROSCOPY;  Surgeon: [name], MD;  Location: [facility];  Service: Urology;  Laterality: Right;       Family History  Problem Relation Age of Onset   Colon cancer Other        Age of Onset-late 81's    Social History   Tobacco Use   Smoking status: Former    Packs/day: 0.00    Years: 30.00    Pack years: 0.00    Types: Cigars, Cigarettes   Smokeless tobacco: Never  Vaping Use   Vaping Use: Never used  Substance Use Topics   Alcohol use: Yes    Comment: seldom    Drug use: No    Home Medications Prior to Admission medications   Medication Sig Start Date End Date Taking? Authorizing Provider  ALPRAZOLAM XR 1 MG 24 hr tablet Take 1 mg by mouth at bedtime. 11/17/19   [provider]  aspirin EC 81 MG tablet Take 1 tablet (81 mg total) by mouth daily. Swallow whole. 12/24/19   Josue Hector, MD  atorvastatin (LIPITOR) 20 MG tablet Take 20 mg by mouth daily. 11/09/19   [provider]   benzocaine (ANBESOL) 10 % mucosal gel Use as directed 1 application in the mouth or throat as needed for mouth pain.    [provider]  calcium elemental as carbonate (TUMS ULTRA 1000) 400 MG chewable tablet Chew 2,000 mg by mouth daily as needed for heartburn.    [provider]  Cholecalciferol (DIALYVITE VITAMIN D 5000) 125 MCG (5000 UT) capsule Take 5,000 Units by mouth daily.    [provider]  gabapentin (NEURONTIN) 300 MG capsule Take 900 mg by mouth 4 (four) times daily.    [provider]  levothyroxine (SYNTHROID, LEVOTHROID) 25 MCG tablet Take 25 mcg by mouth daily before breakfast.    [provider]  Lidocaine  HCl (LIDOCAINE PLUS) 4 % CREA Apply 1 application topically daily as needed (pain).    [provider]  LORazepam (ATIVAN) 0.5 MG tablet Take 0.5 mg by mouth 3 (three) times daily as needed for anxiety.    [provider]  nitroGLYCERIN (NITROSTAT) 0.4 MG SL tablet Place 1 tablet (0.4 mg total) under the tongue every 5 (five) minutes as needed. 12/24/19   Josue Hector, MD  Polyethyl Glycol-Propyl Glycol (SYSTANE ULTRA) 0.4-0.3 % SOLN Place 2 drops into both eyes as needed (for dry eyes).     [provider]  polyethylene glycol (MIRALAX / GLYCOLAX) 17 g packet Take 17 g by mouth 2 (two) times daily.    [provider]  Probiotic Product (TRUBIOTICS PO) Take 1 capsule by mouth at bedtime.    [provider]  rizatriptan (MAXALT-MLT) 10 MG disintegrating tablet Take 10 mg by mouth as needed (for migraines and may repeat once in 2 hours, if no relief).     [provider]  vitamin k 100 MCG tablet Take 100 mcg by mouth daily.    [provider]    Allergies    Sulfa antibiotics and Coconut oil  Review of Systems   Review of Systems  Constitutional:  Negative for diaphoresis and fever.  Respiratory:  Negative for cough, shortness of breath and wheezing.    Cardiovascular:  Positive for chest pain.  Gastrointestinal:  Positive for nausea. Negative for abdominal pain and vomiting.  Genitourinary:  Negative for dysuria.  Musculoskeletal:  Negative for arthralgias.  Skin:  Negative for wound.  Neurological:  Negative for dizziness, weakness, numbness and headaches.  Psychiatric/Behavioral:  Negative for confusion.    Physical Exam Updated Vital Signs BP (!) 107/58   Pulse (!) 55   Temp 97.9 F (36.6 C) (Oral)   Resp 19   Ht '6\' 3"'$  (1.905 m)   Wt 88 kg   SpO2 100%   BMI 24.25 kg/m   Physical Exam Vitals and nursing note reviewed.  Constitutional:      General: He is not in acute distress.    Appearance: He is well-developed. He is not ill-appearing.  Eyes:     Conjunctiva/sclera: Conjunctivae normal.  Cardiovascular:     Rate and Rhythm: Normal rate and regular rhythm.     Pulses: Normal pulses.  Pulmonary:     Effort: Pulmonary effort is normal.  Abdominal:     General: There is no distension.     Palpations: Abdomen is soft.     Tenderness: There is no abdominal tenderness.  Musculoskeletal:        General: Normal range of motion.     Cervical back: Normal range of motion.     Right lower leg: No edema.     Left lower leg: No edema.  Skin:    General: Skin is warm.     Capillary Refill: Capillary refill takes less than 2 seconds.     Findings: No erythema or rash.  Neurological:     General: No focal deficit present.     Mental Status: He is alert.     Sensory: No sensory deficit.     Motor: No weakness.    ED Results / Procedures / Treatments   Labs (all labs ordered are listed, but only abnormal results are displayed) Labs Reviewed  BASIC METABOLIC PANEL - Abnormal; Notable for the following components:      Result Value   Glucose, Bld 116 (*)  Creatinine, Ser 1.27 (*)    All other components within normal limits  CBC - Abnormal; Notable for the following components:   Platelets 124 (*)    All other  components within normal limits  SARS CORONAVIRUS 2 (TAT 6-24 HRS)  HIV ANTIBODY (ROUTINE TESTING W REFLEX)  TSH  CBC  BASIC METABOLIC PANEL  MAGNESIUM  PHOSPHORUS  LIPID PANEL  TROPONIN I (HIGH SENSITIVITY)  TROPONIN I (HIGH SENSITIVITY)  TROPONIN I (HIGH SENSITIVITY)    EKG  EKG Interpretation  Date/Time: 01/10/21  Ventricular Rate:  69  PR Interval: 170   QRS Duration:  86 QT Interval: 390  QTC Calculation: 417  R Axis:    Text Interpretation: Normal sinus rhythm.  Rightward axis.  Borderline EKG.   EKG reviewed by Dr. Roderic Palau  Radiology DG Chest 2 View  Result Date: 01/10/2021 CLINICAL DATA:  Colonoscopy today with subsequent chest pain. EXAM: CHEST - 2 VIEW COMPARISON:  11/02/2019 FINDINGS: Heart and mediastinal shadows are normal except for mild aortic atherosclerotic calcification. The lungs are clear. The vascularity is normal. No free air seen under the diaphragm. No significant bone finding. IMPRESSION: No active cardiopulmonary disease. Electronically Signed   By: Nelson Chimes M.D.   On: 01/10/2021 14:38    Procedures Procedures   Medications Ordered in ED Medications - No data to display  ED Course  I have reviewed the triage vital signs and the nursing notes.  Pertinent labs & imaging results that were available during my care of the patient were reviewed by me and considered in my medical decision making (see chart for details).    MDM Rules/Calculators/A&P                            HEART score 4  Patient here with sudden onset left chest pain, left arm pain left jaw pain.  Symptoms began at noon.  Symptoms lasted approximately 3 hours.  Had colonoscopy earlier today. On review of medical records, patient had reassuring cardiac stress test in 2016 and echocardiogram July 2021 that showed EF of 55 to 60%.  He is followed by Dr. [name] with cardiology.  On exam here, patient states that chest pain is improved, but not resolved continues to have nausea,  tingling of his left arm and left jaw.  EKG without acute ischemic changes.  Chest x-ray without acute findings.  Low clinical suspicion for PE as pain is not pleuritic and he does not dyspnea.  Electrolytes show slightly elevated serum creatinine, but near baseline.  No leukocytosis.  Delta troponin unchanged.  Given patient's heart score and concerning symptoms, I feel that he would benefit from admission for observation.  Patient given IV fluids and aspirin.  I have withheld nitroglycerin due to systolic pressure of [id number].   Discussed findings with [facility] hospitalist, Dr. Nevada Crane who agrees to admit patient   Final Clinical Impression(s) / ED Diagnoses Final diagnoses:  Chest pain, unspecified type    Rx / DC Orders ED Discharge Orders     None        Bufford Lope 01/10/21 2257    Milton Ferguson, MD 01/14/21 [phone]

## 2021-01-10 NOTE — ED Notes (Signed)
Pt had ate some toast and drank some pepsi

## 2021-01-10 NOTE — ED Triage Notes (Signed)
Pt had colostomy today and began to have CP after he got home today. On left side and radiates down left arm and hand.  Hx of CP.  Did not take any of his NTG.

## 2021-01-10 NOTE — ED Notes (Signed)
Pt continues to complain of chest pain states he has recent weight loss and feeling ill.

## 2021-01-10 NOTE — Op Note (Signed)
[facility] Patient Name: Frank Weber Procedure Date: 01/10/2021 9:08 AM MRN: [id number] Date of Birth: [birth year] Attending MD: Maylon Peppers ,  CSN: [id number] Age: 70 Admit Type: Outpatient Procedure:                Upper GI endoscopy Indications:              Early satiety, Weight loss Providers:                Maylon Peppers, Caprice Kluver, Aram Candela Referring MD:              Medicines:                Monitored Anesthesia Care Complications:            No immediate complications. Estimated Blood Loss:     Estimated blood loss: none. Procedure:                Pre-Anesthesia Assessment:                           - Prior to the procedure, a History and Physical                            was performed, and patient medications, allergies                            and sensitivities were reviewed. The patient's                            tolerance of previous anesthesia was reviewed.                           - The risks and benefits of the procedure and the                            sedation options and risks were discussed with the                            patient. All questions were answered and informed                            consent was obtained.                           - ASA Grade Assessment: II - A patient with mild                            systemic disease.                           After obtaining informed consent, the endoscope was                            passed under direct vision. Throughout the                            procedure, the patient's blood pressure,  pulse, and                            oxygen saturations were monitored continuously. The                            [id number] scope was introduced through the                            mouth, and advanced to the second part of duodenum.                            The upper GI endoscopy was accomplished without                            difficulty. The patient tolerated the  procedure                            well. Scope In: 9:24:22 AM Scope Out: 9:39:16 AM Total Procedure Duration: 0 hours 14 minutes 54 seconds  Findings:      Two tongues of salmon-colored mucosa were present from 44 to 45 cm.       Query possible nodularity in one of the islands. The maximum       longitudinal extent of these esophageal mucosal changes was 1 cm in       length. Biopsies were taken with a cold forceps for histology.      Localized erythematous and edematous mucosa without bleeding was found       in the gastric antrum. Biopsies were taken with a cold forceps for       Helicobacter pylori testing.      The examined duodenum was normal. Impression:               - Salmon-colored mucosa suspicious for                            short-segment Barrett's esophagus. Biopsied.                           - Erythematous mucosa in the antrum. Biopsied.                           - Normal examined duodenum. Moderate Sedation:      Per Anesthesia Care Recommendation:           - Discharge patient to home (ambulatory).                           - Resume previous diet.                           - Await pathology results.                           - If persistent symptoms may need to consider                            gastric  emptying study. Procedure Code(s):        --- Professional ---                           [phone], Esophagogastroduodenoscopy, flexible,                            transoral; with biopsy, single or multiple Diagnosis Code(s):        --- Professional ---                           K22.8, Other specified diseases of esophagus                           K31.89, Other diseases of stomach and duodenum                           R68.81, Early satiety                           R63.4, Abnormal weight loss CPT copyright 2019 [employer]. All rights reserved. The codes documented in this report are preliminary and upon coder review may  be revised to meet  current compliance requirements. Maylon Peppers, MD Maylon Peppers,  01/10/2021 10:39:03 AM This report has been signed electronically. Number of Addenda: 0

## 2021-01-10 NOTE — Discharge Instructions (Addendum)
     -   Discharge patient to home  - Resume previous diet.  - Await pathology results.  - Take Miralax compliantly. - Repeat colonoscopy for surveillance based on pathology results.

## 2021-01-10 NOTE — Transfer of Care (Signed)
Immediate Anesthesia Transfer of Care Note  Patient: Frank Weber  Procedure(s) Performed: ESOPHAGOGASTRODUODENOSCOPY (EGD) WITH PROPOFOL COLONOSCOPY WITH PROPOFOL BIOPSY POLYPECTOMY  Patient Location: Endoscopy Unit  Anesthesia Type:General  Level of Consciousness: awake, alert , oriented and patient cooperative  Airway & Oxygen Therapy: Patient Spontanous Breathing  Post-op Assessment: Report given to RN, Post -op Vital signs reviewed and stable and Patient moving all extremities X 4  Post vital signs: Reviewed and stable  Last Vitals:  Vitals Value Taken Time  BP    Temp    Pulse    Resp    SpO2      Last Pain:  Vitals:   01/10/21 0920  TempSrc:   PainSc: 0-No pain         Complications: No notable events documented.

## 2021-01-10 NOTE — Interval H&P Note (Signed)
History and Physical Interval Note:  01/10/2021 8:39 AM 70 y.o. male presenting today with a history of  GERD, Hypothyroidism, HLD, and IBS-C, coming to the hospital for evaluation of early satiety and rectal pain episodes in the setting of constipation.  Patient states that he has felt a little nauseated since yesterday but denies any any abdominal pain.  No new complaints besides the ones listed above.  BP 106/68   Pulse (!) 41   Temp 97.8 F (36.6 C) (Oral)   Resp 15   Ht '6\' 3"'$  (1.905 m)   Wt 88 kg   SpO2 98%   BMI 24.25 kg/m  GENERAL: The patient is AO x3, in no acute distress. HEENT: Head is normocephalic and atraumatic. EOMI are intact. Mouth is well hydrated and without lesions. NECK: Supple. No masses LUNGS: Clear to auscultation. No presence of rhonchi/wheezing/rales. Adequate chest expansion HEART: RRR, normal s1 and s2. ABDOMEN: Soft, nontender, no guarding, no peritoneal signs, and nondistended. BS +. No masses. EXTREMITIES: Without any cyanosis, clubbing, rash, lesions or edema. NEUROLOGIC: AOx3, no focal motor deficit. SKIN: no jaundice, no rashes  Frank Weber  has presented today for surgery, with the diagnosis of Early Satiety Rectal Pain.  The various methods of treatment have been discussed with the patient and family. After consideration of risks, benefits and other options for treatment, the patient has consented to  Procedure(s) with comments: ESOPHAGOGASTRODUODENOSCOPY (EGD) WITH PROPOFOL (N/A) - 11:00, moved up per [name] - pt knows arrival time COLONOSCOPY WITH PROPOFOL (N/A) as a surgical intervention.  The patient's history has been reviewed, patient examined, no change in status, stable for surgery.  I have reviewed the patient's chart and labs.  Questions were answered to the patient's satisfaction.     Frank Weber

## 2021-01-10 NOTE — Telephone Encounter (Signed)
I received a call from the patient today stating that after he arrived home he started presenting significant chest pain in the left side of his chest which radiated to his face and arm, when I called the patient he stated that he was already in the ER at [facility] and he was undergoing an EKG to evaluate cardiac reasons.  He denied having any abdominal pain or diarrhea but stated that he was having significant chest pain and shortness of breath.  He will keep me posted about his clinical course.

## 2021-01-10 NOTE — Anesthesia Postprocedure Evaluation (Signed)
Anesthesia Post Note  Patient: Frank Weber  Procedure(s) Performed: ESOPHAGOGASTRODUODENOSCOPY (EGD) WITH PROPOFOL COLONOSCOPY WITH PROPOFOL BIOPSY POLYPECTOMY  Patient location during evaluation: Endoscopy Anesthesia Type: General Level of consciousness: awake and alert and oriented Pain management: pain level controlled Vital Signs Assessment: post-procedure vital signs reviewed and stable Respiratory status: spontaneous breathing and respiratory function stable Cardiovascular status: blood pressure returned to baseline and stable Postop Assessment: no apparent nausea or vomiting Anesthetic complications: no   No notable events documented.   Last Vitals:  Vitals:   01/10/21 1037 01/10/21 1041  BP: (!) 91/59 107/67  Pulse: (!) 44 (!) 43  Resp: 14 (!) 8  Temp: 36.6 C   SpO2: 100% 100%    Last Pain:  Vitals:   01/10/21 1037  TempSrc: Oral  PainSc:                  Shinichi Anguiano C [employer]

## 2021-01-11 ENCOUNTER — Observation Stay (HOSPITAL_COMMUNITY): Payer: Medicare Other

## 2021-01-11 ENCOUNTER — Other Ambulatory Visit (INDEPENDENT_AMBULATORY_CARE_PROVIDER_SITE_OTHER): Payer: Self-pay | Admitting: Gastroenterology

## 2021-01-11 ENCOUNTER — Observation Stay (HOSPITAL_BASED_OUTPATIENT_CLINIC_OR_DEPARTMENT_OTHER): Payer: Medicare Other

## 2021-01-11 DIAGNOSIS — R0602 Shortness of breath: Secondary | ICD-10-CM | POA: Diagnosis not present

## 2021-01-11 DIAGNOSIS — R0789 Other chest pain: Secondary | ICD-10-CM | POA: Diagnosis not present

## 2021-01-11 DIAGNOSIS — E785 Hyperlipidemia, unspecified: Secondary | ICD-10-CM | POA: Diagnosis not present

## 2021-01-11 DIAGNOSIS — R079 Chest pain, unspecified: Secondary | ICD-10-CM

## 2021-01-11 DIAGNOSIS — M79604 Pain in right leg: Secondary | ICD-10-CM | POA: Diagnosis not present

## 2021-01-11 DIAGNOSIS — I25119 Atherosclerotic heart disease of native coronary artery with unspecified angina pectoris: Secondary | ICD-10-CM | POA: Diagnosis not present

## 2021-01-11 DIAGNOSIS — K6289 Other specified diseases of anus and rectum: Secondary | ICD-10-CM | POA: Diagnosis not present

## 2021-01-11 DIAGNOSIS — M79605 Pain in left leg: Secondary | ICD-10-CM | POA: Diagnosis not present

## 2021-01-11 LAB — BASIC METABOLIC PANEL
Anion gap: 4 — ABNORMAL LOW (ref 5–15)
BUN: 11 mg/dL (ref 8–23)
CO2: 27 mmol/L (ref 22–32)
Calcium: 8.3 mg/dL — ABNORMAL LOW (ref 8.9–10.3)
Chloride: 103 mmol/L (ref 98–111)
Creatinine, Ser: 1.25 mg/dL — ABNORMAL HIGH (ref 0.61–1.24)
GFR, Estimated: >60 mL/min (ref >60)
Glucose, Bld: 121 mg/dL — ABNORMAL HIGH (ref 70–99)
Potassium: 3.9 mmol/L (ref 3.5–5.1)
Sodium: 134 mmol/L — ABNORMAL LOW (ref 135–145)

## 2021-01-11 LAB — SURGICAL PATHOLOGY

## 2021-01-11 LAB — MAGNESIUM: Magnesium: 2 mg/dL (ref 1.7–2.4)

## 2021-01-11 LAB — LIPID PANEL
Cholesterol: 112 mg/dL (ref 0–200)
HDL: 48 mg/dL (ref >40)
LDL Cholesterol: 60 mg/dL (ref 0–99)
Total CHOL/HDL Ratio: 2.3 RATIO
Triglycerides: 22 mg/dL (ref <150)
VLDL: 4 mg/dL (ref 0–40)

## 2021-01-11 LAB — ECHOCARDIOGRAM COMPLETE
AR max vel: 2.46 cm2
AV Area VTI: 2.07 cm2
AV Area mean vel: 2.3 cm2
AV Mean grad: 4 mmHg
AV Peak grad: 8.2 mmHg
Ao pk vel: 1.43 m/s
Area-P 1/2: 4.08 cm2
Height: 75 in
MV VTI: 1.75 cm2
S' Lateral: 3.27 cm
Weight: 3104 oz

## 2021-01-11 LAB — CBC
HCT: 39.5 % (ref 39.0–52.0)
Hemoglobin: 13 g/dL (ref 13.0–17.0)
MCH: 32 pg (ref 26.0–34.0)
MCHC: 32.9 g/dL (ref 30.0–36.0)
MCV: 97.3 fL (ref 80.0–100.0)
Platelets: 130 10*3/uL — ABNORMAL LOW (ref 150–400)
RBC: 4.06 MIL/uL — ABNORMAL LOW (ref 4.22–5.81)
RDW: 12.7 % (ref 11.5–15.5)
WBC: 4.4 10*3/uL (ref 4.0–10.5)
nRBC: 0 % (ref 0.0–0.2)

## 2021-01-11 LAB — PHOSPHORUS: Phosphorus: 3.3 mg/dL (ref 2.5–4.6)

## 2021-01-11 LAB — TSH: TSH: 1.966 u[IU]/mL (ref 0.350–4.500)

## 2021-01-11 LAB — TROPONIN I (HIGH SENSITIVITY)
Troponin I (High Sensitivity): 4 ng/L (ref <18)
Troponin I (High Sensitivity): 7 ng/L (ref <18)

## 2021-01-11 LAB — SARS CORONAVIRUS 2 (TAT 6-24 HRS): SARS Coronavirus 2: NEGATIVE

## 2021-01-11 LAB — HIV ANTIBODY (ROUTINE TESTING W REFLEX): HIV Screen 4th Generation wRfx: NONREACTIVE

## 2021-01-11 LAB — D-DIMER, QUANTITATIVE: D-Dimer, Quant: 1.15 ug/mL-FEU — ABNORMAL HIGH (ref 0.00–0.50)

## 2021-01-11 MED ORDER — HEPARIN (PORCINE) 25000 UT/250ML-% IV SOLN: 1100.0000 [IU]/h | INTRAVENOUS | Status: DC

## 2021-01-11 MED ORDER — NITROGLYCERIN 0.4 MG SL SUBL: 0.4000 mg | SUBLINGUAL_TABLET | SUBLINGUAL | Status: DC | PRN

## 2021-01-11 MED ORDER — HEPARIN BOLUS VIA INFUSION
4000.0000 [IU] | Freq: Once | INTRAVENOUS | Status: DC
  Filled 2021-01-11: qty 4000

## 2021-01-11 MED ORDER — PANTOPRAZOLE SODIUM 40 MG PO TBEC: 40.0000 mg | DELAYED_RELEASE_TABLET | Freq: Every day | ORAL | 0 refills | Status: DC

## 2021-01-11 MED ORDER — IOHEXOL 350 MG/ML SOLN
100.0000 mL | Freq: Once | INTRAVENOUS | Status: AC | PRN
  Administered 2021-01-11: 100 mL via INTRAVENOUS

## 2021-01-11 MED ORDER — PANTOPRAZOLE SODIUM 40 MG PO TBEC
40.0000 mg | DELAYED_RELEASE_TABLET | Freq: Every day | ORAL | Status: DC
  Administered 2021-01-11: 40 mg via ORAL
  Filled 2021-01-11: qty 1

## 2021-01-11 MED ORDER — PROMETHAZINE HCL 12.5 MG PO TABS
12.5000 mg | ORAL_TABLET | Freq: Four times a day (QID) | ORAL | 0 refills | Status: DC | PRN
Start: 2021-01-11 — End: 2021-09-17

## 2021-01-11 MED ORDER — ALUM & MAG HYDROXIDE-SIMETH 200-200-20 MG/5ML PO SUSP
30.0000 mL | Freq: Once | ORAL | Status: AC
  Administered 2021-01-11: 30 mL via ORAL
  Filled 2021-01-11: qty 30

## 2021-01-11 NOTE — Discharge Summary (Signed)
[facility]  Physician Discharge Summary   Patient ID: Frank Weber MRN: [id number] DOB/AGE: [birth year] 70 y.o.  Admit date: 01/10/2021 Discharge date: 01/11/2021    PCP: [name], MD  DISCHARGE DIAGNOSES:  Chest pain probably GI in origin Recently detected colonic polyps Possible GERD Chronic kidney disease stage II Hypothyroidism History of polyneuropathy   RECOMMENDATIONS FOR OUTPATIENT FOLLOW UP: Patient instructed to follow-up with his outpatient providers.  Cardiology to arrange outpatient follow-up as well   Home Health: None Equipment/Devices: None  CODE STATUS: Full code  DISCHARGE CONDITION: fair  Diet recommendation: As before  INITIAL HISTORY: 70 y.o. male with medical history significant for former tobacco use (quit a month ago)  polyneuropathy, GERD, post EGD and colonoscopy on 01/10/2021 who presented to [facility] ED due to chest pain after he returned home from endoscopy suite.  No known complication from his endoscopies done today.  He went home, ate, drank coffee and ate a piece of toast then had sudden onset left-sided chest pain pressure-like 10 out of 10, nonexertional, radiated to his left arm and left jaw lasting over 3 hours.  He received a full dose of aspirin in the ED.  Due to low BPs patient did not receive sublingual nitroglycerin.  2 sets of troponin came back negative.  No evidence of acute ischemia on twelve-lead EKG.  Chest x-ray nonacute.  At the time of this visit his chest pain was about a 3 out of 10.  Then later became more severe.  Troponin and twelve-lead EKG reordered.  2D echo ordered and pending.  Cardiology will be consulted in the morning by day team.  Admitted to the hospitalist service.   Consultations: [facility] cardiology  Procedures: Transthoracic echocardiogram (see report below)  HOSPITAL COURSE:   Chest pain Patient's symptoms were associated with the eating and drinking.  He also underwent EGD and  colonoscopy earlier in the day his symptoms began.  Troponins were normal.  Echocardiogram showed normal systolic function.  Patient was seen by cardiology.  EKG was reassuring.  D-dimer was noted to be elevated so patient underwent CT angiogram as well as lower extremity Dopplers which did not reveal either PE or DVT.  Patient's symptoms were thought to be primarily GI in origin probably related to esophageal spasm.  Patient was reassured.  He was given a prescription for Protonix.  He was also given Maalox with which he experienced some relief.  Discussed with cardiology who also felt the patient could go home.  They will arrange outpatient follow-up.  Patient was told to seek attention if his symptoms were to recur.  LDL noted to be 60.  Polyneuropathy Resume home medications   History of migraines headache   Post EGD and colonoscopy on 01/10/2021 Found to have multiple polyps on colonoscopy.  Also found to have diverticulosis.  EGD raised concern for short segment Barrett's esophagus.   CKD 2 Stable   Hypothyroidism Continue home medication  Patient is stable.  Okay for discharge home today.   PERTINENT LABS:  The results of significant diagnostics from this hospitalization (including imaging, microbiology, ancillary and laboratory) are listed below for reference.    Microbiology: Recent Results (from the past 240 hour(s))  SARS CORONAVIRUS 2 (TAT 6-24 HRS) Nasopharyngeal Nasopharyngeal Swab     Status: None   Collection Time: 01/10/21  8:35 PM   Specimen: Nasopharyngeal Swab  Result Value Ref Range Status   SARS Coronavirus 2 NEGATIVE NEGATIVE Final    Comment: (NOTE)  SARS-CoV-2 target nucleic acids are NOT DETECTED.  The SARS-CoV-2 RNA is generally detectable in upper and lower respiratory specimens during the acute phase of infection. Negative results do not preclude SARS-CoV-2 infection, do not rule out co-infections with other pathogens, and should not be used as the sole  basis for treatment or other patient management decisions. Negative results must be combined with clinical observations, patient history, and epidemiological information. The expected result is Negative.  Fact Sheet for Patients: SugarRoll.be  Fact Sheet for Healthcare Providers: https://www.woods-mathews.com/  This test is not yet approved or cleared by the [place] FDA and  has been authorized for detection and/or diagnosis of SARS-CoV-2 by FDA under an Emergency Use Authorization (EUA). This EUA will remain  in effect (meaning this test can be used) for the duration of the COVID-19 declaration under Se ction 564(b)(1) of the Act, 21 U.S.C. section 360bbb-3(b)(1), unless the authorization is terminated or revoked sooner.  Performed at [facility], [place] [address] [place], [place] [address]      Labs:  COVID-19 Labs  Recent Labs    01/11/21 1050  DDIMER 1.15*    Lab Results  Component Value Date   SARSCOV2NAA NEGATIVE 01/10/2021   [place] NEGATIVE 02/05/2020      Basic Metabolic Panel: Recent Labs  Lab 01/10/21 1425 01/11/21 0051  NA 140 134*  K 4.1 3.9  CL 105 103  CO2 27 27  GLUCOSE 116* 121*  BUN 11 11  CREATININE 1.27* 1.25*  CALCIUM 9.7 8.3*  MG  --  2.0  PHOS  --  3.3    CBC: Recent Labs  Lab 01/10/21 1425 01/11/21 0051  WBC 5.1 4.4  HGB 15.0 13.0  HCT 46.1 39.5  MCV 99.1 97.3  PLT 124* 130*      IMAGING STUDIES DG Chest 2 View  Result Date: 01/10/2021 CLINICAL DATA:  Colonoscopy today with subsequent chest pain. EXAM: CHEST - 2 VIEW COMPARISON:  11/02/2019 FINDINGS: Heart and mediastinal shadows are normal except for mild aortic atherosclerotic calcification. The lungs are clear. The vascularity is normal. No free air seen under the diaphragm. No significant bone finding. IMPRESSION: No active cardiopulmonary disease. Electronically Signed   By: Nelson Chimes M.D.   On: 01/10/2021  14:38   CT Angio Chest Pulmonary Embolism (PE) W or WO Contrast  Result Date: 01/11/2021 CLINICAL DATA:  PE suspected, high prob. Left side chest pain radiating down left arm. Shortness of breath. Elevated D-dimer. EXAM: CT ANGIOGRAPHY CHEST WITH CONTRAST TECHNIQUE: Multidetector CT imaging of the chest was performed using the standard protocol during bolus administration of intravenous contrast. Multiplanar CT image reconstructions and MIPs were obtained to evaluate the vascular anatomy. CONTRAST:  163m OMNIPAQUE IOHEXOL 350 MG/ML SOLN COMPARISON:  12/23/2019 FINDINGS: Cardiovascular: Heart is normal size. Scattered aortic calcifications. No aneurysm. No filling defects in the pulmonary arteries to suggest pulmonary emboli. Mediastinum/Nodes: No mediastinal, hilar, or axillary adenopathy. Trachea and esophagus are unremarkable. Thyroid unremarkable. Lungs/Pleura: Emphysema. Linear densities in the lower lobes could reflect atelectasis or scarring. No effusions. Upper Abdomen: Imaging into the upper abdomen demonstrates no acute findings. Musculoskeletal: Chest wall soft tissues are unremarkable. No acute bony abnormality. Review of the MIP images confirms the above findings. IMPRESSION: No evidence of pulmonary embolus. Lower lobe atelectasis or scarring. Aortic Atherosclerosis (ICD10-I70.0) and Emphysema (ICD10-J43.9). Electronically Signed   By: KRolm BaptiseM.D.   On: 01/11/2021 12:09   UKoreaVenous Img Lower Bilateral (DVT)  Result Date: 01/11/2021  CLINICAL DATA:  Bilateral lower extremity pain. EXAM: BILATERAL LOWER EXTREMITY VENOUS DOPPLER ULTRASOUND TECHNIQUE: Gray-scale sonography with graded compression, as well as color Doppler and duplex ultrasound were performed to evaluate the lower extremity deep venous systems from the level of the common femoral vein and including the common femoral, femoral, profunda femoral, popliteal and calf veins including the posterior tibial, peroneal and gastrocnemius  veins when visible. The superficial great saphenous vein was also interrogated. Spectral Doppler was utilized to evaluate flow at rest and with distal augmentation maneuvers in the common femoral, femoral and popliteal veins. COMPARISON:  None. FINDINGS: RIGHT LOWER EXTREMITY Common Femoral Vein: No evidence of thrombus. Normal compressibility, respiratory phasicity and response to augmentation. Saphenofemoral Junction: No evidence of thrombus. Normal compressibility and flow on color Doppler imaging. Profunda Femoral Vein: No evidence of thrombus. Normal compressibility and flow on color Doppler imaging. Femoral Vein: No evidence of thrombus. Normal compressibility, respiratory phasicity and response to augmentation. Popliteal Vein: No evidence of thrombus. Normal compressibility, respiratory phasicity and response to augmentation. Calf Veins: No evidence of thrombus. Normal compressibility and flow on color Doppler imaging. Superficial Great Saphenous Vein: No evidence of thrombus. Normal compressibility. Venous Reflux:  None. Other Findings: No evidence of superficial thrombophlebitis or abnormal fluid collection. LEFT LOWER EXTREMITY Common Femoral Vein: No evidence of thrombus. Normal compressibility, respiratory phasicity and response to augmentation. Saphenofemoral Junction: No evidence of thrombus. Normal compressibility and flow on color Doppler imaging. Profunda Femoral Vein: No evidence of thrombus. Normal compressibility and flow on color Doppler imaging. Femoral Vein: No evidence of thrombus. Normal compressibility, respiratory phasicity and response to augmentation. Popliteal Vein: No evidence of thrombus. Normal compressibility, respiratory phasicity and response to augmentation. Calf Veins: No evidence of thrombus. Normal compressibility and flow on color Doppler imaging. Superficial Great Saphenous Vein: No evidence of thrombus. Normal compressibility. Venous Reflux:  None. Other Findings: No  evidence of superficial thrombophlebitis or abnormal fluid collection. IMPRESSION: No evidence of deep venous thrombosis in either lower extremity. Electronically Signed   By: Aletta Edouard M.D.   On: 01/11/2021 12:03   ECHOCARDIOGRAM COMPLETE  Result Date: 01/11/2021    ECHOCARDIOGRAM REPORT   Patient Name:   EVERSON PRESLEY Date of Exam: 01/11/2021 Medical Rec #:  [id number]      Height:       75.0 in Accession #:    [id number]     Weight:       194.0 lb Date of Birth:  1951/04/28      BSA:          2.166 m Patient Age:    55 years       BP:           100/75 mmHg Patient Gender: M              HR:           66 bpm. Exam Location:  [facility] Procedure: 2D Echo, Cardiac Doppler and Color Doppler Indications:    Chest Pain  History:        Patient has prior history of Echocardiogram examinations, most                 recent 12/15/2019. Signs/Symptoms:Chest Pain and Murmur; Risk                 Factors:Dyslipidemia.  Sonographer:    Wenda Low Referring Phys: [id number] [place]  1. Left ventricular ejection fraction, by estimation, is 60 to 65%.  The left ventricle has normal function. The left ventricle has no regional wall motion abnormalities. Left ventricular diastolic parameters were normal.  2. Right ventricular systolic function is normal. The right ventricular size is normal. There is normal pulmonary artery systolic pressure. The estimated right ventricular systolic pressure is 0000000 mmHg.  3. Right atrial size was upper normal.  4. The mitral valve is grossly normal. No evidence of mitral valve regurgitation.  5. The aortic valve is tricuspid. Aortic valve regurgitation is trivial. Aortic valve mean gradient measures 4.0 mmHg.  6. Aortic dilatation noted. There is mild dilatation of the ascending aorta, measuring 40 mm.  7. The inferior vena cava is normal in size with <50% respiratory variability, suggesting right atrial pressure of 8 mmHg. Comparison(s): No significant change from prior  study. Prior images reviewed side by side. FINDINGS  Left Ventricle: Left ventricular ejection fraction, by estimation, is 60 to 65%. The left ventricle has normal function. The left ventricle has no regional wall motion abnormalities. The left ventricular internal cavity size was normal in size. There is  no left ventricular hypertrophy. Left ventricular diastolic parameters were normal. Right Ventricle: The right ventricular size is normal. No increase in right ventricular wall thickness. Right ventricular systolic function is normal. There is normal pulmonary artery systolic pressure. The tricuspid regurgitant velocity is 2.28 m/s, and  with an assumed right atrial pressure of 8 mmHg, the estimated right ventricular systolic pressure is 0000000 mmHg. Left Atrium: Left atrial size was normal in size. Right Atrium: Right atrial size was upper normal. Pericardium: There is no evidence of pericardial effusion. Mitral Valve: The mitral valve is grossly normal. No evidence of mitral valve regurgitation. MV peak gradient, 3.7 mmHg. The mean mitral valve gradient is 1.0 mmHg. Tricuspid Valve: The tricuspid valve is grossly normal. Tricuspid valve regurgitation is mild. Aortic Valve: The aortic valve is tricuspid. Aortic valve regurgitation is trivial. Aortic valve mean gradient measures 4.0 mmHg. Aortic valve peak gradient measures 8.2 mmHg. Aortic valve area, by VTI measures 2.07 cm. Pulmonic Valve: The pulmonic valve was grossly normal. Pulmonic valve regurgitation is trivial. Aorta: The aortic root is normal in size and structure and aortic dilatation noted. There is mild dilatation of the ascending aorta, measuring 40 mm. Venous: The inferior vena cava is normal in size with less than 50% respiratory variability, suggesting right atrial pressure of 8 mmHg. IAS/Shunts: No atrial level shunt detected by color flow Doppler.  LEFT VENTRICLE PLAX 2D LVIDd:         5.32 cm  Diastology LVIDs:         3.27 cm  LV e' medial:     12.40 cm/s LV PW:         0.81 cm  LV E/e' medial:  7.6 LV IVS:        0.76 cm  LV e' lateral:   15.30 cm/s LVOT diam:     2.00 cm  LV E/e' lateral: 6.1 LV SV:         75 LV SV Index:   35 LVOT Area:     3.14 cm  RIGHT VENTRICLE RV Basal diam:  4.07 cm RV Mid diam:    3.50 cm RV S prime:     14.50 cm/s TAPSE (M-mode): 3.5 cm LEFT ATRIUM             Index       RIGHT ATRIUM           Index LA diam:  4.00 cm 1.85 cm/m  RA Area:     22.90 cm LA Vol (A2C):   79.3 ml 36.61 ml/m RA Volume:   73.80 ml  34.07 ml/m LA Vol (A4C):   39.5 ml 18.24 ml/m LA Biplane Vol: 58.6 ml 27.05 ml/m  AORTIC VALVE AV Area (Vmax):    2.46 cm AV Area (Vmean):   2.30 cm AV Area (VTI):     2.07 cm AV Vmax:           143.00 cm/s AV Vmean:          96.000 cm/s AV VTI:            0.362 m AV Peak Grad:      8.2 mmHg AV Mean Grad:      4.0 mmHg LVOT Vmax:         112.00 cm/s LVOT Vmean:        70.400 cm/s LVOT VTI:          0.239 m LVOT/AV VTI ratio: 0.66  AORTA Ao Root diam: 3.70 cm Ao Asc diam:  4.00 cm MITRAL VALVE               TRICUSPID VALVE MV Area (PHT): 4.08 cm    TR Peak grad:   20.8 mmHg MV Area VTI:   1.75 cm    TR Vmax:        228.00 cm/s MV Peak grad:  3.7 mmHg MV Mean grad:  1.0 mmHg    SHUNTS MV Vmax:       0.97 m/s    Systemic VTI:  0.24 m MV Vmean:      44.6 cm/s   Systemic Diam: 2.00 cm MV Decel Time: 186 msec MV E velocity: 94.00 cm/s MV A velocity: 74.40 cm/s MV E/A ratio:  1.26 Rozann Lesches MD Electronically signed by Rozann Lesches MD Signature Date/Time: 01/11/2021/11:33:55 AM    Final     DISCHARGE EXAMINATION: Vitals:   01/10/21 2230 01/11/21 0000 01/11/21 0421 01/11/21 1344  BP: (!) 99/59 98/64 100/75 100/63  Pulse: (!) 56 (!) 48 (!) 55 (!) 55  Resp: '18  20 15  '$ Temp:  98.5 F (36.9 C) 98 F (36.7 C) 98.8 F (37.1 C)  TempSrc:  Oral Oral Oral  SpO2: 97% 99% 100% 97%  Weight:      Height:       General appearance: Awake alert.  In no distress Resp: Clear to auscultation bilaterally.   Normal effort Cardio: S1-S2 is normal regular.  No S3-S4.  No rubs murmurs or bruit GI: Abdomen is soft.  Nontender nondistended.  Bowel sounds are present normal.  No masses organomegaly Extremities: No edema.  Full range of motion of lower extremities. Neurologic: Alert and oriented x3.  No focal neurological deficits.    DISPOSITION: Home  Discharge Instructions     Call MD for:  difficulty breathing, headache or visual disturbances   Complete by: As directed    Call MD for:  extreme fatigue   Complete by: As directed    Call MD for:  persistant dizziness or light-headedness   Complete by: As directed    Call MD for:  persistant nausea and vomiting   Complete by: As directed    Call MD for:  severe uncontrolled pain   Complete by: As directed    Call MD for:  temperature >100.4   Complete by: As directed    Diet - low sodium heart healthy   Complete by:  As directed    Discharge instructions   Complete by: As directed    Please seek attention immediately if your symptoms were to worsen or recur or if he were to develop new symptoms such as shortness of breath or lightheadedness.  Take your medications as prescribed.  You were cared for by a hospitalist during your hospital stay. If you have any questions about your discharge medications or the care you received while you were in the hospital after you are discharged, you can call the unit and asked to speak with the hospitalist on call if the hospitalist that took care of you is not available. Once you are discharged, your primary care physician will handle any further medical issues. Please note that NO REFILLS for any discharge medications will be authorized once you are discharged, as it is imperative that you return to your primary care physician (or establish a relationship with a primary care physician if you do not have one) for your aftercare needs so that they can reassess your need for medications and monitor your lab values.  If you do not have a primary care physician, you can call [phone] for a physician referral.   Increase activity slowly   Complete by: As directed           Allergies as of 01/11/2021       Reactions   Sulfa Antibiotics Anaphylaxis, Shortness Of Breath, Rash   Coconut Oil Rash        Medication List     TAKE these medications    ALPRAZolam XR 1 MG 24 hr tablet Generic drug: ALPRAZolam Take 1 mg by mouth at bedtime.   Anbesol 10 % mucosal gel Generic drug: benzocaine Use as directed 1 application in the mouth or throat as needed for mouth pain.   aspirin EC 81 MG tablet Take 1 tablet (81 mg total) by mouth daily. Swallow whole.   atorvastatin 20 MG tablet Commonly known as: LIPITOR Take 20 mg by mouth daily.   Dialyvite Vitamin D 5000 125 MCG (5000 UT) capsule Generic drug: Cholecalciferol Take 5,000 Units by mouth daily.   gabapentin 300 MG capsule Commonly known as: NEURONTIN Take 900 mg by mouth 4 (four) times daily.   levothyroxine 25 MCG tablet Commonly known as: SYNTHROID Take 25 mcg by mouth daily before breakfast.   Lidocaine HCl 4 % Crea Apply 1 application topically daily as needed (pain).   LORazepam 0.5 MG tablet Commonly known as: ATIVAN Take 0.5 mg by mouth 3 (three) times daily as needed for anxiety.   nitroGLYCERIN 0.4 MG SL tablet Commonly known as: NITROSTAT Place 1 tablet (0.4 mg total) under the tongue every 5 (five) minutes as needed.   pantoprazole 40 MG tablet Commonly known as: PROTONIX Take 1 tablet (40 mg total) by mouth daily at 12 noon for 14 days.   polyethylene glycol 17 g packet Commonly known as: MIRALAX / GLYCOLAX Take 17 g by mouth 2 (two) times daily.   promethazine 12.5 MG tablet Commonly known as: PHENERGAN Take 1 tablet (12.5 mg total) by mouth every 6 (six) hours as needed for nausea or vomiting.   rizatriptan 10 MG disintegrating tablet Commonly known as: MAXALT-MLT Take 10 mg by mouth as needed (for  migraines and may repeat once in 2 hours, if no relief).   Systane Ultra 0.4-0.3 % Soln Generic drug: Polyethyl Glycol-Propyl Glycol Place 2 drops into both eyes as needed (for dry eyes).   TRUBIOTICS PO Take 1  capsule by mouth at bedtime.   Tums Ultra 1000 400 MG chewable tablet Generic drug: calcium elemental as carbonate Chew 2,000 mg by mouth daily as needed for heartburn.   vitamin k 100 MCG tablet Take 100 mcg by mouth daily.          Follow-up Information     [name], MD. Schedule an appointment as soon as possible for a visit in 1 week(s).   Specialty: Internal Medicine Contact information: [address] [place] [id number] [phone]         [facility] [facility] Follow up.   Specialty: Cardiology Why: Cardiology Hospital Follow-up with Dr. [name] on 01/16/2021 at 10:15 AM. Contact information: [address] [place]                TOTAL DISCHARGE TIME: 68 minutes  [place]  [facility] Pager on [email or web address]  01/12/2021, 11:32 AM

## 2021-01-11 NOTE — Progress Notes (Signed)
Reflux changes noted on most recent EGD performed by Dr. Jenetta Downer, Will send prescription for protonix '40mg'$  Daily x3 months.

## 2021-01-11 NOTE — Progress Notes (Signed)
Nsg Discharge Note  Admit Date:  01/10/2021 Discharge date: 01/11/2021   Frank Weber to be D/C'd Home per MD order.  AVS completed.  Patient able to verbalize understanding.  Discharge Medication: Allergies as of 01/11/2021       Reactions   Sulfa Antibiotics Anaphylaxis, Shortness Of Breath, Rash   Coconut Oil Rash        Medication List     TAKE these medications    ALPRAZolam XR 1 MG 24 hr tablet Generic drug: ALPRAZolam Take 1 mg by mouth at bedtime.   Anbesol 10 % mucosal gel Generic drug: benzocaine Use as directed 1 application in the mouth or throat as needed for mouth pain.   aspirin EC 81 MG tablet Take 1 tablet (81 mg total) by mouth daily. Swallow whole.   atorvastatin 20 MG tablet Commonly known as: LIPITOR Take 20 mg by mouth daily.   Dialyvite Vitamin D 5000 125 MCG (5000 UT) capsule Generic drug: Cholecalciferol Take 5,000 Units by mouth daily.   gabapentin 300 MG capsule Commonly known as: NEURONTIN Take 900 mg by mouth 4 (four) times daily.   levothyroxine 25 MCG tablet Commonly known as: SYNTHROID Take 25 mcg by mouth daily before breakfast.   Lidocaine HCl 4 % Crea Apply 1 application topically daily as needed (pain).   LORazepam 0.5 MG tablet Commonly known as: ATIVAN Take 0.5 mg by mouth 3 (three) times daily as needed for anxiety.   nitroGLYCERIN 0.4 MG SL tablet Commonly known as: NITROSTAT Place 1 tablet (0.4 mg total) under the tongue every 5 (five) minutes as needed.   pantoprazole 40 MG tablet Commonly known as: PROTONIX Take 1 tablet (40 mg total) by mouth daily at 12 noon for 14 days. Start taking on: January 12, 2021   polyethylene glycol 17 g packet Commonly known as: MIRALAX / GLYCOLAX Take 17 g by mouth 2 (two) times daily.   promethazine 12.5 MG tablet Commonly known as: PHENERGAN Take 1 tablet (12.5 mg total) by mouth every 6 (six) hours as needed for nausea or vomiting.   rizatriptan 10 MG disintegrating  tablet Commonly known as: MAXALT-MLT Take 10 mg by mouth as needed (for migraines and may repeat once in 2 hours, if no relief).   Systane Ultra 0.4-0.3 % Soln Generic drug: Polyethyl Glycol-Propyl Glycol Place 2 drops into both eyes as needed (for dry eyes).   TRUBIOTICS PO Take 1 capsule by mouth at bedtime.   Tums Ultra 1000 400 MG chewable tablet Generic drug: calcium elemental as carbonate Chew 2,000 mg by mouth daily as needed for heartburn.   vitamin k 100 MCG tablet Take 100 mcg by mouth daily.        Discharge Assessment: Vitals:   01/11/21 0421 01/11/21 1344  BP: 100/75 100/63  Pulse: (!) 55 (!) 55  Resp: 20 15  Temp: 98 F (36.7 C) 98.8 F (37.1 C)  SpO2: 100% 97%   Skin clean, dry and intact without evidence of skin break down, no evidence of skin tears noted. IV catheter discontinued intact. Site without signs and symptoms of complications - no redness or edema noted at insertion site, patient denies c/o pain - only slight tenderness at site.  Dressing with slight pressure applied.  D/c Instructions-Education: Discharge instructions given to patient with verbalized understanding. D/c education completed with patient including follow up instructions, medication list, d/c activities limitations if indicated, with other d/c instructions as indicated by MD - patient able to verbalize understanding,  all questions fully answered. Patient instructed to return to ED, call 911, or call MD for any changes in condition.  Patient escorted via [place], and D/C home via private auto.  Berton Bon, RN 01/11/2021 2:40 PM

## 2021-01-11 NOTE — Progress Notes (Addendum)
ANTICOAGULATION CONSULT NOTE - Initial Consult  Pharmacy Consult for Heparin Indication: chest pain/ACS  Allergies  Allergen Reactions   Sulfa Antibiotics Anaphylaxis, Shortness Of Breath and Rash   Coconut Oil Rash    Patient Measurements: Height: '6\' 3"'$  (190.5 cm) Weight: 88 kg (194 lb) IBW/kg (Calculated) : 84.5 HEPARIN DW (KG): 88   Vital Signs: Temp: 98 F (36.7 C) (08/04 0421) Temp Source: Oral (08/04 0421) BP: 100/75 (08/04 0421) Pulse Rate: 55 (08/04 0421)  Labs: Recent Labs    01/10/21 1425 01/10/21 1650 01/10/21 2148 01/11/21 0051  HGB 15.0  --   --  13.0  HCT 46.1  --   --  39.5  PLT 124*  --   --  130*  CREATININE 1.27*  --   --  1.25*  TROPONINIHS '4 4 3 4    '$ Estimated Creatinine Clearance: 67.6 mL/min (A) (by C-G formula based on SCr of 1.25 mg/dL (H)).   Medical History: Past Medical History:  Diagnosis Date   Arthritis    right knee   Constipation    Frequency of urination    GERD (gastroesophageal reflux disease)    Heart murmur    "slight"    History of kidney stones    History of melanoma excision    2012--  NECK/ HEAD   History of squamous cell carcinoma excision    RIGHT EAR   Hyperlipidemia    Hypothyroidism    IBS (irritable bowel syndrome)    states slight   Lumbar stenosis    L5 - S1   Migraines    Peripheral neuropathy    legs and feet   Right flank pain    Right ureteral stone    Urgency of urination    Wears glasses     Medications:  Medications Prior to Admission  Medication Sig Dispense Refill Last Dose   ALPRAZOLAM XR 1 MG 24 hr tablet Take 1 mg by mouth at bedtime.   01/09/2021   aspirin EC 81 MG tablet Take 1 tablet (81 mg total) by mouth daily. Swallow whole. 90 tablet 3 01/09/2021 at 2300   atorvastatin (LIPITOR) 20 MG tablet Take 20 mg by mouth daily.   01/09/2021   benzocaine (ANBESOL) 10 % mucosal gel Use as directed 1 application in the mouth or throat as needed for mouth pain.   Past Month   calcium  elemental as carbonate (TUMS ULTRA 1000) 400 MG chewable tablet Chew 2,000 mg by mouth daily as needed for heartburn.   Past Month   Cholecalciferol (DIALYVITE VITAMIN D 5000) 125 MCG (5000 UT) capsule Take 5,000 Units by mouth daily.   01/09/2021   gabapentin (NEURONTIN) 300 MG capsule Take 900 mg by mouth 4 (four) times daily.   01/09/2021   levothyroxine (SYNTHROID, LEVOTHROID) 25 MCG tablet Take 25 mcg by mouth daily before breakfast.   01/09/2021   Lidocaine HCl 4 % CREA Apply 1 application topically daily as needed (pain).   Past Week   LORazepam (ATIVAN) 0.5 MG tablet Take 0.5 mg by mouth 3 (three) times daily as needed for anxiety.   Past Week   nitroGLYCERIN (NITROSTAT) 0.4 MG SL tablet Place 1 tablet (0.4 mg total) under the tongue every 5 (five) minutes as needed. 25 tablet 3    Polyethyl Glycol-Propyl Glycol (SYSTANE ULTRA) 0.4-0.3 % SOLN Place 2 drops into both eyes as needed (for dry eyes).    Past Week   polyethylene glycol (MIRALAX / GLYCOLAX) 17 g packet  Take 17 g by mouth 2 (two) times daily.   01/09/2021   Probiotic Product (TRUBIOTICS PO) Take 1 capsule by mouth at bedtime.   01/09/2021   rizatriptan (MAXALT-MLT) 10 MG disintegrating tablet Take 10 mg by mouth as needed (for migraines and may repeat once in 2 hours, if no relief).    Past Month   vitamin k 100 MCG tablet Take 100 mcg by mouth daily.   01/09/2021    Assessment: Patient has developed chest pain which radiated into his neck and left arm. Troponin values have been negative at 4, 4. 3 and 4. EKG shows NSR, HR 69 with RAD and no acute ST changes. Echocardiogram is pending to assess for any structural changes. Given his continued pain, will check repeat Hs Troponin. Considering repeat cath. Cardiology asked to start heparin. Patient not on any oral anticoagulants.  Goal of Therapy:  Heparin level 0.3-0.7 units/ml Monitor platelets by anticoagulation protocol: Yes   Plan:  Give 4000 units bolus x 1 Start heparin infusion at  1100 units/hr Check anti-Xa level in ~6-8 hours and daily while on heparin Continue to monitor H&H and platelets  Isac Sarna, BS Vena [place], BCPS Clinical Pharmacist Pager [phone] 01/11/2021,9:39 AM  Addendum: Heparin discontinued by cardiology after entering  Isac Sarna, [place], [place] Pharmacist Pager [phone]

## 2021-01-11 NOTE — Discharge Instructions (Signed)
Cardiologist office will schedule outpatient follow-up.

## 2021-01-11 NOTE — Consult Note (Addendum)
Cardiology Consultation:   Patient ID: ELAY TRANI MRN: [id number]; DOB: 02-19-1951  Admit date: 01/10/2021 Date of Consult: 01/11/2021  PCP:  [name], MD   [facility] Providers Cardiologist:  Jenkins Rouge, MD        Patient Profile:   BURNES SOULES is a 70 y.o. male with a past medical history of CAD (s/p cath in 2009 showing no significant CAD, Coronary CT in 12/2019 showing moderate LAD stenosis but not significant by FFR), HLD and Hypothyroidism who is being seen 01/11/2021 for the evaluation of chest pain at the request of Dr. Maryland Pink.  History of Present Illness:   Mr. Salmela was last examined by Dr. [name] in 04/2020 and recent Coronary CT had demonstrated LAD stenosis but not significant by FFR. He was continued on ASA and statin therapy but was not prescribed a BB due to his baseline bradycardia.   He did follow-up with GI in the interim due to constipation and underwent EGD and colonoscopy at [facility] yesterday. EGD showed salmon-colored mucosa suspicious for Barrett's esophagus and was biopsied. Colonoscopy demonstrated polyps, diverticulosis and non-bleeding internal hemorrhoids.  Upon returning home from his procedure, he developed chest pain which radiated down his left arm and prompted him to come to the ED for further evaluation. In talking with the patient today, he reports initially feeling well following his GI procedures yesterday but upon returning home developed a significant discomfort along his left pectoral region and felt like someone was trying to grab his heart. He reports the discomfort was significant and radiated into his neck and left arm.  He does indicate that this occurred after eating toast and drinking coffee.  Symptoms were not worse with exertion or positional changes. He came to the ED for further evaluation and his pain lasted for over 4 to 5 hours per his initial episode. He has experienced intermittent episodes of discomfort overnight  which can last for an hour to 2 hours at a time. Still reports his pain as a 2-3/10 this AM. He initially had nausea with this episode but says this has improved. No reported orthopnea, PND or lower extremity edema. He does report some episodes of left-sided chest discomfort and left-sided abdominal discomfort over the past several weeks but felt like his symptoms were likely due to constipation as he reports going over a week at times without having a bowel movement.  Initial labs show WBC 5.1, Hgb 15.0, platelets 124, Na+ 140, K+ 4.1 and creatinine 1.27 (at 1.20 in 09/2020). TSH 1.966.FLP shows total cholesterol of 112, triglycerides 22, HDL 48 and LDL 60. Initial and repeat Hs Troponin values have been negative at 4, 4. 3 and 4. CXR with no acute findings. EKG shows NSR, HR 69 with RAD and no acute ST changes.    Past Medical History:  Diagnosis Date   Arthritis    right knee   Constipation    Frequency of urination    GERD (gastroesophageal reflux disease)    Heart murmur    "slight"    History of kidney stones    History of melanoma excision    2012--  NECK/ HEAD   History of squamous cell carcinoma excision    RIGHT EAR   Hyperlipidemia    Hypothyroidism    IBS (irritable bowel syndrome)    states slight   Lumbar stenosis    L5 - S1   Migraines    Peripheral neuropathy    legs and feet  Right flank pain    Right ureteral stone    Urgency of urination    Wears glasses     Past Surgical History:  Procedure Laterality Date   CARDIAC CATHETERIZATION  07-08-2007  dr Tami Ribas    no significant CAD, preserved LVF, ef 50%//  30% pLAD   CARDIOVASCULAR STRESS TEST  06-20-2011  dr croitoru   Low risk study/  normal perfusion scan showing attenuation artifact in the anterior region of myocardium with no ischemia , infarct or scar/  normal LV funciton and wall motion , ef 62%   COLONOSCOPY N/A 02/17/2013   Rehman:examination performed to cecum. mild sigmoid colon diverticulosis, 4  small polyps ablated via cold biopsy from rectosigmoid junction (hyperplastic). External hemorrhoids.   CYSTOSCOPY W/ URETERAL STENT PLACEMENT Right 06/18/2015   Procedure: CYSTOSCOPY WITH RIGHT RETROGRADE PYELOGRAM RIGHT URETERAL STENT PLACEMENT;  Surgeon: [name], MD;  Location: [facility];  Service: Urology;  Laterality: Right;   CYSTOSCOPY/URETEROSCOPY/HOLMIUM LASER/STENT PLACEMENT Right 06/27/2015   Procedure: CYSTOSCOPY RIGHT URETEROSCOPY  STENT REMOVAL; STONE EXTRACTION WITH BASKET;  Surgeon: [name], MD;  Location: [place];  Service: Urology;  Laterality: Right;   HEAD & NECK SKIN LESION EXCISIONAL BIOPSY     HOLMIUM LASER APPLICATION Right [id number]   Procedure: HOLMIUM LASER LITHOTRIPSY ;  Surgeon: [name], MD;  Location: [facility];  Service: Urology;  Laterality: Right;   I & D EXTREMITY Right 07/14/2018   Procedure: IRRIGATION AND DEBRIDEMENT RIGHT KNEE WITH CLOSURE;  Surgeon: Nicholes Stairs, MD;  Location: [place];  Service: Orthopedics;  Laterality: Right;   INGUINAL HERNIA REPAIR Left 10/21/2007   KNEE ARTHROSCOPY W/ ACL RECONSTRUCTION Right 1992   MENISCUS REPAIR Right    2019, dr Theda Sers    SHOULDER ARTHROSCOPY WITH OPEN ROTATOR CUFF REPAIR Left 07/27/2014   Procedure: LEFT SHOULDER ARTHROSCOPY WITH MINI OPEN ROTATOR CUFF REPAIR;  Surgeon: [name], MD;  Location: [place];  Service: Orthopedics;  Laterality: Left;   SHOULDER ARTHROSCOPY WITH OPEN ROTATOR CUFF REPAIR Right 2006   TONSILLECTOMY AND ADENOIDECTOMY  age 51   TOTAL KNEE ARTHROPLASTY Right 07/10/2018   Procedure: TOTAL KNEE ARTHROPLASTY;  Surgeon: [name], MD;  Location: [facility];  Service: Orthopedics;  Laterality: Right;   TRANSTHORACIC ECHOCARDIOGRAM  07/30/2007   normal LVF, ef >55%/  mild AR, MR , PR and TR/  mild AV sclerosis without stenosis/    URETEROSCOPY Right 06/18/2015   Procedure: URETEROSCOPY;  Surgeon: [name], MD;   Location: [facility];  Service: Urology;  Laterality: Right;     Home Medications:  Prior to Admission medications   Medication Sig Start Date End Date Taking? Authorizing Provider  ALPRAZOLAM XR 1 MG 24 hr tablet Take 1 mg by mouth at bedtime. 11/17/19  Yes [provider]  aspirin EC 81 MG tablet Take 1 tablet (81 mg total) by mouth daily. Swallow whole. 12/24/19  Yes Josue Hector, MD  atorvastatin (LIPITOR) 20 MG tablet Take 20 mg by mouth daily. 11/09/19  Yes [provider]  benzocaine (ANBESOL) 10 % mucosal gel Use as directed 1 application in the mouth or throat as needed for mouth pain.   Yes [provider]  calcium elemental as carbonate (TUMS ULTRA 1000) 400 MG chewable tablet Chew 2,000 mg by mouth daily as needed for heartburn.   Yes [provider]  Cholecalciferol (DIALYVITE VITAMIN D 5000) 125 MCG (5000 UT) capsule Take 5,000 Units by  mouth daily.   Yes [provider]  gabapentin (NEURONTIN) 300 MG capsule Take 900 mg by mouth 4 (four) times daily.   Yes [provider]  levothyroxine (SYNTHROID, LEVOTHROID) 25 MCG tablet Take 25 mcg by mouth daily before breakfast.   Yes [provider]  Lidocaine HCl 4 % CREA Apply 1 application topically daily as needed (pain).   Yes [provider]  LORazepam (ATIVAN) 0.5 MG tablet Take 0.5 mg by mouth 3 (three) times daily as needed for anxiety.   Yes [provider]  nitroGLYCERIN (NITROSTAT) 0.4 MG SL tablet Place 1 tablet (0.4 mg total) under the tongue every 5 (five) minutes as needed. 12/24/19  Yes Josue Hector, MD  Polyethyl Glycol-Propyl Glycol (SYSTANE ULTRA) 0.4-0.3 % SOLN Place 2 drops into both eyes as needed (for dry eyes).    Yes [provider]  polyethylene glycol (MIRALAX / GLYCOLAX) 17 g packet Take 17 g by mouth 2 (two) times daily.   Yes [provider]  Probiotic Product (TRUBIOTICS PO) Take 1 capsule by mouth at bedtime.   Yes  [provider]  rizatriptan (MAXALT-MLT) 10 MG disintegrating tablet Take 10 mg by mouth as needed (for migraines and may repeat once in 2 hours, if no relief).    Yes [provider]  vitamin k 100 MCG tablet Take 100 mcg by mouth daily.   Yes [provider]    Inpatient Medications: Scheduled Meds:  aspirin EC  81 mg Oral Daily   atorvastatin  20 mg Oral Daily   calcium carbonate  2,000 mg Oral Daily   cholecalciferol  5,000 Units Oral Daily   gabapentin  900 mg Oral QID   levothyroxine  25 mcg Oral QAC breakfast   Continuous Infusions:  lactated ringers 50 mL/hr at 01/11/21 0006   PRN Meds: acetaminophen, ALPRAZolam, HYDROmorphone (DILAUDID) injection, nitroGLYCERIN, ondansetron (ZOFRAN) IV  Allergies:    Allergies  Allergen Reactions   Sulfa Antibiotics Anaphylaxis, Shortness Of Breath and Rash   Coconut Oil Rash    Social History:   Social History   Socioeconomic History   Marital status: Married    Spouse name: Not on file   Number of children: Not on file   Years of education: Not on file   Highest education level: Not on file  Occupational History   Not on file  Tobacco Use   Smoking status: Former    Packs/day: 0.00    Years: 30.00    Pack years: 0.00    Types: Cigars, Cigarettes   Smokeless tobacco: Never  Vaping Use   Vaping Use: Never used  Substance and Sexual Activity   Alcohol use: Yes    Comment: seldom    Drug use: No   Sexual activity: Not on file  Other Topics Concern   Not on file  Social History Narrative   Not on file   Social Determinants of Health   Financial Resource Strain: Not on file  Food Insecurity: Not on file  Transportation Needs: Not on file  Physical Activity: Not on file  Stress: Not on file  Social Connections: Not on file  Intimate Partner Violence: Not on file    Family History:    Family History  Problem Relation Age of Onset   Colon cancer Other        Age of Onset-late 9's      ROS:  Please see the history of present illness.   All other ROS  reviewed and negative.     Physical Exam/Data:   Vitals:   01/10/21 2200 01/10/21 2230 01/11/21 0000 01/11/21 0421  BP: 110/66 (!) 99/59 98/64 100/75  Pulse: (!) 58 (!) 56 (!) 48 (!) 55  Resp: (!) '24 18  20  '$ Temp:   98.5 F (36.9 C) 98 F (36.7 C)  TempSrc:   Oral Oral  SpO2: 97% 97% 99% 100%  Weight:      Height:        Intake/Output Summary (Last 24 hours) at 01/11/2021 0934 Last data filed at 01/11/2021 0500 Gross per 24 hour  Intake 244.69 ml  Output --  Net 244.69 ml   Last 3 Weights 01/10/2021 01/10/2021 01/08/2021  Weight (lbs) 194 lb 194 lb 194 lb 12.8 oz  Weight (kg) 87.998 kg 87.998 kg 88.361 kg     Body mass index is 24.25 kg/m.  General:  Well nourished, well developed male appearingin no acute distress HEENT: normal Lymph: no adenopathy Neck: no JVD Endocrine:  No thryomegaly Vascular: No carotid bruits; FA pulses 2+ bilaterally without bruits  Cardiac:  normal S1, S2; RRR; no murmur Lungs:  clear to auscultation bilaterally, no wheezing, rhonchi or rales  Abd: soft, nontender, no hepatomegaly  Ext: no pitting edema Musculoskeletal:  No deformities, BUE and BLE strength normal and equal Skin: warm and dry  Neuro:  CNs 2-12 intact, no focal abnormalities noted Psych:  Normal affect   EKG:  The EKG was personally reviewed and demonstrates: NSR, HR 69 with RAD and no acute ST changes.  Telemetry:  Telemetry was personally reviewed and demonstrates: Sinus bradycardia, HR in 40's to 50's.   Relevant CV Studies:  Coronary CT: 12/2019 Coronary Arteries:  Normal coronary origin.  Right dominance.   RCA is a large dominant artery that gives rise to PDA and PLA. There is no plaque.   Left main is a large artery that gives rise to LAD and LCX arteries.   LAD is a large vessel that has calcified and non calcified plaque in the proximal to mid segment causing moderate stenosis  (50-69%). Calcium/blooming artifacts in the mid LAD prevents accurate assessment for higher degree of stenosis.   LCX is a non-dominant artery that gives rise to 2 obtuse marginal branches. There is minimal irregularities in the mid LCx (0-24%).   Other findings:   Normal pulmonary vein drainage into the left atrium.   Normal left atrial appendage without a thrombus.   Normal size of the pulmonary artery.   IMPRESSION: 1. Coronary calcium score of 227. This was 64th percentile for age and sex matched control.   2. Normal coronary origin with right dominance.   3. Calcified and non calcified plaque in the mid LAD causing moderate stenosis.   4. Calcium/blooming artifacts in the mid LAD prevents accurate assessment for higher degree of stenosis   5. CAD-RADS 3. Moderate stenosis. Consider symptom-guided anti-ischemic pharmacotherapy as well as risk factor modification per guideline directed care. Additional analysis with CT FFR will be submitted and reported separately.  FFR:  1. Left Main:  No significant stenosis.   2. LAD: No significant focal stenosis in the proximal or mid LAD, FFR 0.98, 0.81 respectively. FFR slowly tapers to 0.78 in the distal LAD. Most distal vessel not analyzed likely due to vessel size 3. LCX: No significant stenosis. Ffr 0.97 proximally and 0.85 distally 4. RCA: No significant stenosis.  Ffr 0.98 proximally, 0.92 distally   IMPRESSION: 1.  CT FFR analysis didn't  show any significant focal stenosis.   Echocardiogram: 12/2019 IMPRESSIONS     1. Left ventricular ejection fraction, by estimation, is 55 to 60%. The  left ventricle has normal function. The left ventricle has no regional  wall motion abnormalities. Left ventricular diastolic parameters were  normal.   2. Right ventricular systolic function is normal. The right ventricular  size is normal. There is normal pulmonary artery systolic pressure. The  estimated right ventricular  systolic pressure is 123456 mmHg.   3. The mitral valve is grossly normal. Trivial mitral valve  regurgitation.   4. The aortic valve is tricuspid, mildly calcified. Aortic valve  regurgitation is trivial.   5. Aortic dilatation noted. There is mild dilatation of the ascending  aorta.   6. The inferior vena cava is normal in size with greater than 50%  respiratory variability, suggesting right atrial pressure of 3 mmHg.   Laboratory Data:  High Sensitivity Troponin:   Recent Labs  Lab 01/10/21 1425 01/10/21 1650 01/10/21 2148 01/11/21 0051  TROPONINIHS '4 4 3 4     '$ Chemistry Recent Labs  Lab 01/10/21 1425 01/11/21 0051  NA 140 134*  K 4.1 3.9  CL 105 103  CO2 27 27  GLUCOSE 116* 121*  BUN 11 11  CREATININE 1.27* 1.25*  CALCIUM 9.7 8.3*  GFRNONAA >60 >60  ANIONGAP 8 4*    No results for input(s): PROT, ALBUMIN, AST, ALT, ALKPHOS, BILITOT in the last 168 hours. Hematology Recent Labs  Lab 01/10/21 1425 01/11/21 0051  WBC 5.1 4.4  RBC 4.65 4.06*  HGB 15.0 13.0  HCT 46.1 39.5  MCV 99.1 97.3  MCH 32.3 32.0  MCHC 32.5 32.9  RDW 12.8 12.7  PLT 124* 130*     Radiology/Studies:  DG Chest 2 View  Result Date: 01/10/2021 CLINICAL DATA:  Colonoscopy today with subsequent chest pain. EXAM: CHEST - 2 VIEW COMPARISON:  11/02/2019 FINDINGS: Heart and mediastinal shadows are normal except for mild aortic atherosclerotic calcification. The lungs are clear. The vascularity is normal. No free air seen under the diaphragm. No significant bone finding. IMPRESSION: No active cardiopulmonary disease. Electronically Signed   By: Nelson Chimes M.D.   On: 01/10/2021 14:38     Assessment and Plan:   1. Chest Pain with Mixed Features - He developed chest pain which radiated into his neck and left arm following EGD and colonoscopy yesterday, states that this happened after eating toast and drinking coffee when he got home. Pain is not triggered by exertion, positional changes,  nonpleuritic, no cough. Episodes of pain have lasted for 4-5 hours at a time but are still occurring to mild degree.  - Initial and repeat Hs Troponin values have been negative at 4, 4. 3 and 4. EKG shows NSR, HR 69 with RAD and no acute ST changes. Echocardiogram is pending to assess for any structural changes. Given his continued pain, will check repeat Hs Troponin. Would also recommend adding D-dimer given concurrent leg pain. His pain overall seems atypical for angina but if his pain persists and an etiology is not identified, may need to consider a catheterization for definitive evaluation given his known moderate LAD stenosis by Coronary CT last year.   2. CAD - He is s/p cath in 2009 showing no significant CAD with Coronary CT in 12/2019 showing moderate LAD stenosis but not significant by FFR. Repeat echo pending.  - Continue ASA and Atorvastatin '20mg'$  daily. No BB given baseline bradycardia.   3. HLD -  FLP shows total cholesterol of 112, triglycerides 22, HDL 48 and LDL 60. Continue Atorvastatin '20mg'$  daily.   4. Constipation/Weight Loss - He has been experiencing issues with constipation and reports a 6 lb unintentional weight loss since 09/2020. Being followed by GI with recent EGD and colonoscopy yesterday as outlined above.  5. Leg Cramps - Possibly due to dehydration in the setting of recent colonoscopy prep but symptoms continue to persist despite hydration. Will defer further testing to the admitting team.     For questions or updates, please contact [place] Please consult [email or web address] for contact info under    Signed, Erma Heritage, PA-C  01/11/2021 9:34 AM   Attending note:  Patient seen and examined.  I reviewed his records and updated the chart.  Mr. Portee has a history of nonobstructive, predominantly LAD distribution coronary atherosclerosis by cardiac CT last year, FFR not demonstrating any flow-limiting stenosis.  He underwent diagnostic EGD and  colonoscopy yesterday by Dr. Jenetta Downer given recent symptoms of abdominal discomfort in constipation.  No acute findings were noted. There was suspicion for Barrett's esophagus with biopsy taken.  Colonoscopy showed polyps and diverticulosis with nonbleeding internal hemorrhoids.  He states that he had some nausea and vague abdominal discomfort when he was going home, later ate some toast and drank coffee, subsequent to that developed a pressure sensation in his left upper chest which ultimately radiated into his left arm and neck.  This was nonpositional, nonpleuritic, no associated cough, no orthopnea.  Lasted for several hours and he is still experiencing this to mild degree this morning.  Cardiac work-up has been reassuring however with no acute ST segment changes by ECG and normal high-sensitivity troponin I levels x4.  He declined nitroglycerin when offered per nursing this morning.  He has been treated however with Dilaudid per primary team, also complaining of left anterior thigh cramping which is new this morning.  On examination patient appears comfortable, no distress.  He is afebrile, blood pressure 100/75, heart rate in the 50s in sinus rhythm.  Lungs are clear.  Chest without crepitus.  Cardiac exam reveals RRR without gallop, very soft systolic murmur.  Extremities show no pitting edema, no cords.  Pertinent lab work includes potassium 3.9, BUN 11, creatinine 1.25, high-sensitivity troponin I levels of 3-4, LDL 60, hemoglobin 13.0, platelets 130, TSH 1.966.  Chest x-ray reports no acute process.  I personally reviewed his ECG which shows sinus rhythm with borderline low voltage.  Chest pain with typical and atypical features, started after eating toast and having coffee, EGD and colonoscopy earlier in the day as well.  Cardiac enzymes and ECG are reassuring at this time.  He does have a history of nonobstructive coronary atherosclerosis as described above and has been treated medically as an  outpatient on aspirin and Lipitor.  Also complaining of left anterior thigh discomfort which is new this morning.  Echocardiogram has been ordered, we will follow-up on this to review LVEF and exclude any new wall motion abnormalities.  Would also suggest D-dimer and lower extremity venous Dopplers.  Might also consider GI cocktail to see if this helps.  Unclear at this time whether further ischemic work-up will be necessary.  Will continue to follow.  Satira Sark, M.D., F.A.C.C.

## 2021-01-12 ENCOUNTER — Encounter (INDEPENDENT_AMBULATORY_CARE_PROVIDER_SITE_OTHER): Payer: Self-pay | Admitting: *Deleted

## 2021-01-15 NOTE — Progress Notes (Signed)
CARDIOLOGY CONSULT NOTE       Patient ID: Frank Weber MRN: [id number] DOB/AGE: 13-Nov-1950 70 y.o.  Referring Physician: Gerarda Weber Primary Physician: Frank Weber Primary Cardiologist: Frank Weber     HPI:  70 y.o. referred by Frank Frank Weber 11/29/19 for CAD risk Complained of SSCP 2-3 weeks on office visit 11/05/19 Seen in [facility] ED for same. Pain on left side and band around entire front and back. Near diaphragm Smoker over 30 pack years. Pain not always related to exertion No associated pleurisy, palpitations, syncope or dyspnea. 2009 had no obstructive CAD at cath. And normal echo in 2016 Pain helped by fentanyl w/u negative with no acute ECG changes, negative Troponin CT no PE some lung nodules and emphysema He had been seen by [facility] Frank Weber and Frank Weber  He smokes mostly cigars now daily  He is retired after 28 years in [employer] Married x 2 with one son in [place] done 03/24/20 reviewed. Calcium score 227 which was 64 th percentile FFR CT 0.81 mid and 0.78 distal LAD Trial of medical Rx indicated  TTE reviewed from 12/15/19 Normal EF trivial MR/AR   Seen in ER 02/05/20 with diverticulitis Rx with Augmentin and Norco. Also history of bilateral inguinal hernias  No angina. Abdomen improved  Seen by Frank Frank Weber 01/10/21 at [facility]. 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 After coming Home developed SSCP radiating to left arm Occurred after eating toast and drinking coffee Pain lasted 4-5 hours He R/O and there were no acute ECG changes He also has been having constipation and unintentional weight loss 6 lbs since April His colon 01/11/21 showed polyps,diverticulosis and hemorrhoids Pain Rx with Dilaudid Had some Leg pain and US showed no DVT TTE 01/11/21 EF 60-65% no RWMAls trivial AR and aortic root of 4.0  CXR with NAD  CTA with no PE LLL atelectasis /scarring   Has not had issues since Not using nitro   ROS All other systems  reviewed and negative except as noted above  Past Medical History:  Diagnosis Date   Arthritis    right knee   Constipation    Frequency of urination    GERD (gastroesophageal reflux disease)    Heart murmur    "slight"    History of kidney stones    History of melanoma excision    2012--  NECK/ HEAD   History of squamous cell carcinoma excision    RIGHT EAR   Hyperlipidemia    Hypothyroidism    IBS (irritable bowel syndrome)    states slight   Lumbar stenosis    L5 - S1   Migraines    Peripheral neuropathy    legs and feet   Right flank pain    Right ureteral stone    Urgency of urination    Wears glasses     Family History  Problem Relation Age of Onset   Colon cancer Other        Age of Onset-late 37's    Social History   Socioeconomic History   Marital status: Married    Spouse name: Not on file   Number of children: Not on file   Years of education: Not on file   Highest education level: Not on file  Occupational History   Not on file  Tobacco Use   Smoking status: Former    Packs/day: 0.00    Years: 30.00    Pack  years: 0.00    Types: Cigars, Cigarettes   Smokeless tobacco: Never  Vaping Use   Vaping Use: Never used  Substance and Sexual Activity   Alcohol use: Yes    Comment: seldom    Drug use: No   Sexual activity: Not on file  Other Topics Concern   Not on file  Social History Narrative   Not on file   Social Determinants of Health   Financial Resource Strain: Not on file  Food Insecurity: Not on file  Transportation Needs: Not on file  Physical Activity: Not on file  Stress: Not on file  Social Connections: Not on file  Intimate Partner Violence: Not on file    Past Surgical History:  Procedure Laterality Date   CARDIAC CATHETERIZATION  07-08-2007  Frank Weber    no significant CAD, preserved LVF, ef 50%//  30% pLAD   CARDIOVASCULAR STRESS TEST  06-20-2011  Frank Weber   Low risk study/  normal perfusion scan showing attenuation  artifact in the anterior region of myocardium with no ischemia , infarct or scar/  normal LV funciton and wall motion , ef 62%   COLONOSCOPY N/A 02/17/2013   Frank Weber:examination performed to cecum. mild sigmoid colon diverticulosis, 4 small polyps ablated via cold biopsy from rectosigmoid junction (hyperplastic). External hemorrhoids.   CYSTOSCOPY W/ URETERAL STENT PLACEMENT Right 06/18/2015   Procedure: CYSTOSCOPY WITH RIGHT RETROGRADE PYELOGRAM RIGHT URETERAL STENT PLACEMENT;  Surgeon: Frank Seal, Weber;  Location: [facility];  Service: Urology;  Laterality: Right;   CYSTOSCOPY/URETEROSCOPY/HOLMIUM LASER/STENT PLACEMENT Right 06/27/2015   Procedure: CYSTOSCOPY RIGHT URETEROSCOPY  STENT REMOVAL; STONE EXTRACTION WITH BASKET;  Surgeon: Frank Seal, Weber;  Location: [facility];  Service: Urology;  Laterality: Right;   HEAD & NECK SKIN LESION EXCISIONAL BIOPSY     HOLMIUM LASER APPLICATION Right [id number]   Procedure: HOLMIUM LASER LITHOTRIPSY ;  Surgeon: Frank Seal, Weber;  Location: [facility];  Service: Urology;  Laterality: Right;   I & D EXTREMITY Right 07/14/2018   Procedure: IRRIGATION AND DEBRIDEMENT RIGHT KNEE WITH CLOSURE;  Surgeon: Frank Stairs, Weber;  Location: [place];  Service: Orthopedics;  Laterality: Right;   INGUINAL HERNIA REPAIR Left 10/21/2007   KNEE ARTHROSCOPY W/ ACL RECONSTRUCTION Right 1992   MENISCUS REPAIR Right    2019, Frank Frank Weber    SHOULDER ARTHROSCOPY WITH OPEN ROTATOR CUFF REPAIR Left 07/27/2014   Procedure: LEFT SHOULDER ARTHROSCOPY WITH MINI OPEN ROTATOR CUFF REPAIR;  Surgeon: Frank Salen, Weber;  Location: [facility];  Service: Orthopedics;  Laterality: Left;   SHOULDER ARTHROSCOPY WITH OPEN ROTATOR CUFF REPAIR Right 2006   TONSILLECTOMY AND ADENOIDECTOMY  age 55   TOTAL KNEE ARTHROPLASTY Right 07/10/2018   Procedure: TOTAL KNEE ARTHROPLASTY;  Surgeon: Frank Cabal, Weber;  Location: [facility];  Service: Orthopedics;   Laterality: Right;   TRANSTHORACIC ECHOCARDIOGRAM  07/30/2007   normal LVF, ef >55%/  mild AR, MR , PR and TR/  mild AV sclerosis without stenosis/    URETEROSCOPY Right 06/18/2015   Procedure: URETEROSCOPY;  Surgeon: Frank Seal, Weber;  Location: [facility];  Service: Urology;  Laterality: Right;      Current Outpatient Medications:    ALPRAZOLAM XR 1 MG 24 hr tablet, Take 1 mg by mouth at bedtime., Disp: , Rfl:    aspirin EC 81 MG tablet, Take 1 tablet (81 mg total) by mouth daily. Swallow whole., Disp: 90 tablet, Rfl: 3   atorvastatin (LIPITOR) 20 MG tablet,  Take 20 mg by mouth daily., Disp: , Rfl:    benzocaine (ANBESOL) 10 % mucosal gel, Use as directed 1 application in the mouth or throat as needed for mouth pain., Disp: , Rfl:    calcium elemental as carbonate (TUMS ULTRA 1000) 400 MG chewable tablet, Chew 2,000 mg by mouth daily as needed for heartburn., Disp: , Rfl:    Cholecalciferol (DIALYVITE VITAMIN D 5000) 125 MCG (5000 UT) capsule, Take 5,000 Units by mouth daily., Disp: , Rfl:    gabapentin (NEURONTIN) 300 MG capsule, Take 900 mg by mouth 4 (four) times daily., Disp: , Rfl:    levothyroxine (SYNTHROID, LEVOTHROID) 25 MCG tablet, Take 25 mcg by mouth daily before breakfast., Disp: , Rfl:    Lidocaine HCl 4 % CREA, Apply 1 application topically daily as needed (pain)., Disp: , Rfl:    LORazepam (ATIVAN) 0.5 MG tablet, Take 0.5 mg by mouth 3 (three) times daily as needed for anxiety., Disp: , Rfl:    nitroGLYCERIN (NITROSTAT) 0.4 MG SL tablet, Place 1 tablet (0.4 mg total) under the tongue every 5 (five) minutes as needed., Disp: 25 tablet, Rfl: 3   pantoprazole (PROTONIX) 40 MG tablet, Take 1 tablet (40 mg total) by mouth daily at 12 noon for 14 days., Disp: 14 tablet, Rfl: 0   pantoprazole (PROTONIX) 40 MG tablet, Take 1 tablet (40 mg total) by mouth daily., Disp: 90 tablet, Rfl: 0   Polyethyl Glycol-Propyl Glycol (SYSTANE ULTRA) 0.4-0.3 % SOLN, Place 2 drops into both eyes as needed  (for dry eyes). , Disp: , Rfl:    polyethylene glycol (MIRALAX / GLYCOLAX) 17 g packet, Take 17 g by mouth 2 (two) times daily., Disp: , Rfl:    Probiotic Product (TRUBIOTICS PO), Take 1 capsule by mouth at bedtime., Disp: , Rfl:    promethazine (PHENERGAN) 12.5 MG tablet, Take 1 tablet (12.5 mg total) by mouth every 6 (six) hours as needed for nausea or vomiting., Disp: 20 tablet, Rfl: 0   rizatriptan (MAXALT-MLT) 10 MG disintegrating tablet, Take 10 mg by mouth as needed (for migraines and may repeat once in 2 hours, if no relief). , Disp: , Rfl:    vitamin k 100 MCG tablet, Take 100 mcg by mouth daily., Disp: , Rfl:     Physical Exam: Blood pressure 115/76, pulse (!) 54, resp. rate 18, height '6\' 3"'$  (1.905 m), weight 88.6 kg, SpO2 99 %.    Affect appropriate Healthy:  appears stated age 44: normal Neck supple with no adenopathy JVP normal no bruits no thyromegaly Lungs clear with no wheezing and good diaphragmatic motion Heart:  S1/S2 SEM  murmur, no rub, gallop or click PMI normal Abdomen: benighn, BS positve, no tenderness, no AAA no bruit.  No HSM or HJR Distal pulses intact with no bruits No edema Neuro non-focal Skin warm and dry No muscular weakness   Labs:   Lab Results  Component Value Date   WBC 4.4 01/11/2021   HGB 13.0 01/11/2021   HCT 39.5 01/11/2021   MCV 97.3 01/11/2021   PLT 130 (L) 01/11/2021    Recent Labs  Lab 01/11/21 0051  NA 134*  K 3.9  CL 103  CO2 27  BUN 11  CREATININE 1.25*  CALCIUM 8.3*  GLUCOSE 121*   Lab Results  Component Value Date   TROPONINI <0.03 08/01/2014    Lab Results  Component Value Date   CHOL 112 01/11/2021   Lab Results  Component Value Date  HDL 48 01/11/2021   Lab Results  Component Value Date   LDLCALC 60 01/11/2021   Lab Results  Component Value Date   TRIG 22 01/11/2021   Lab Results  Component Value Date   CHOLHDL 2.3 01/11/2021   No results found for: LDLDIRECT    Radiology: DG Chest 2  View  Result Date: 01/10/2021 CLINICAL DATA:  Colonoscopy today with subsequent chest pain. EXAM: CHEST - 2 VIEW COMPARISON:  11/02/2019 FINDINGS: Heart and mediastinal shadows are normal except for mild aortic atherosclerotic calcification. The lungs are clear. The vascularity is normal. No free air seen under the diaphragm. No significant bone finding. IMPRESSION: No active cardiopulmonary disease. Electronically Signed   By: Nelson Chimes M.D.   On: 01/10/2021 14:38   CT Angio Chest Pulmonary Embolism (PE) W or WO Contrast  Result Date: 01/11/2021 CLINICAL DATA:  PE suspected, high prob. Left side chest pain radiating down left arm. Shortness of breath. Elevated D-dimer. EXAM: CT ANGIOGRAPHY CHEST WITH CONTRAST TECHNIQUE: Multidetector CT imaging of the chest was performed using the standard protocol during bolus administration of intravenous contrast. Multiplanar CT image reconstructions and MIPs were obtained to evaluate the vascular anatomy. CONTRAST:  129m OMNIPAQUE IOHEXOL 350 MG/ML SOLN COMPARISON:  12/23/2019 FINDINGS: Cardiovascular: Heart is normal size. Scattered aortic calcifications. No aneurysm. No filling defects in the pulmonary arteries to suggest pulmonary emboli. Mediastinum/Nodes: No mediastinal, hilar, or axillary adenopathy. Trachea and esophagus are unremarkable. Thyroid unremarkable. Lungs/Pleura: Emphysema. Linear densities in the lower lobes could reflect atelectasis or scarring. No effusions. Upper Abdomen: Imaging into the upper abdomen demonstrates no acute findings. Musculoskeletal: Chest wall soft tissues are unremarkable. No acute bony abnormality. Review of the MIP images confirms the above findings. IMPRESSION: No evidence of pulmonary embolus. Lower lobe atelectasis or scarring. Aortic Atherosclerosis (ICD10-I70.0) and Emphysema (ICD10-J43.9). Electronically Signed   By: KRolm BaptiseM.D.   On: 01/11/2021 12:09   UKoreaVenous Img Lower Bilateral (DVT)  Result Date:  01/11/2021 CLINICAL DATA:  Bilateral lower extremity pain. EXAM: BILATERAL LOWER EXTREMITY VENOUS DOPPLER ULTRASOUND TECHNIQUE: Gray-scale sonography with graded compression, as well as color Doppler and duplex ultrasound were performed to evaluate the lower extremity deep venous systems from the level of the common femoral vein and including the common femoral, femoral, profunda femoral, popliteal and calf veins including the posterior tibial, peroneal and gastrocnemius veins when visible. The superficial great saphenous vein was also interrogated. Spectral Doppler was utilized to evaluate flow at rest and with distal augmentation maneuvers in the common femoral, femoral and popliteal veins. COMPARISON:  None. FINDINGS: RIGHT LOWER EXTREMITY Common Femoral Vein: No evidence of thrombus. Normal compressibility, respiratory phasicity and response to augmentation. Saphenofemoral Junction: No evidence of thrombus. Normal compressibility and flow on color Doppler imaging. Profunda Femoral Vein: No evidence of thrombus. Normal compressibility and flow on color Doppler imaging. Femoral Vein: No evidence of thrombus. Normal compressibility, respiratory phasicity and response to augmentation. Popliteal Vein: No evidence of thrombus. Normal compressibility, respiratory phasicity and response to augmentation. Calf Veins: No evidence of thrombus. Normal compressibility and flow on color Doppler imaging. Superficial Great Saphenous Vein: No evidence of thrombus. Normal compressibility. Venous Reflux:  None. Other Findings: No evidence of superficial thrombophlebitis or abnormal fluid collection. LEFT LOWER EXTREMITY Common Femoral Vein: No evidence of thrombus. Normal compressibility, respiratory phasicity and response to augmentation. Saphenofemoral Junction: No evidence of thrombus. Normal compressibility and flow on color Doppler imaging. Profunda Femoral Vein: No evidence of thrombus. Normal compressibility and flow  on color  Doppler imaging. Femoral Vein: No evidence of thrombus. Normal compressibility, respiratory phasicity and response to augmentation. Popliteal Vein: No evidence of thrombus. Normal compressibility, respiratory phasicity and response to augmentation. Calf Veins: No evidence of thrombus. Normal compressibility and flow on color Doppler imaging. Superficial Great Saphenous Vein: No evidence of thrombus. Normal compressibility. Venous Reflux:  None. Other Findings: No evidence of superficial thrombophlebitis or abnormal fluid collection. IMPRESSION: No evidence of deep venous thrombosis in either lower extremity. Electronically Signed   By: Aletta Edouard M.D.   On: 01/11/2021 12:03   ECHOCARDIOGRAM COMPLETE  Result Date: 01/11/2021    ECHOCARDIOGRAM REPORT   Patient Name:   Frank Weber Date of Exam: 01/11/2021 Medical Rec #:  [id number]      Height:       75.0 in Accession #:    [id number]     Weight:       194.0 lb Date of Birth:  Aug 02, 1950      BSA:          2.166 m Patient Age:    72 years       BP:           100/75 mmHg Patient Gender: M              HR:           66 bpm. Exam Location:  [facility] Procedure: 2D Echo, Cardiac Doppler and Color Doppler Indications:    Chest Pain  History:        Patient has prior history of Echocardiogram examinations, most                 recent 12/15/2019. Signs/Symptoms:Chest Pain and Murmur; Risk                 Factors:Dyslipidemia.  Sonographer:    Wenda Low Referring Phys: [id number] [place]  1. Left ventricular ejection Weber, by estimation, is 60 to 65%. The left ventricle has normal function. The left ventricle has no regional wall motion abnormalities. Left ventricular diastolic parameters were normal.  2. Right ventricular systolic function is normal. The right ventricular size is normal. There is normal pulmonary artery systolic pressure. The estimated right ventricular systolic pressure is 0000000 mmHg.  3. Right atrial size was upper normal.   4. The mitral valve is grossly normal. No evidence of mitral valve regurgitation.  5. The aortic valve is tricuspid. Aortic valve regurgitation is trivial. Aortic valve mean gradient measures 4.0 mmHg.  6. Aortic dilatation noted. There is mild dilatation of the ascending aorta, measuring 40 mm.  7. The inferior vena cava is normal in size with <50% respiratory variability, suggesting right atrial pressure of 8 mmHg. Comparison(s): No significant change from prior study. Prior images reviewed side by side. FINDINGS  Left Ventricle: Left ventricular ejection Weber, by estimation, is 60 to 65%. The left ventricle has normal function. The left ventricle has no regional wall motion abnormalities. The left ventricular internal cavity size was normal in size. There is  no left ventricular hypertrophy. Left ventricular diastolic parameters were normal. Right Ventricle: The right ventricular size is normal. No increase in right ventricular wall thickness. Right ventricular systolic function is normal. There is normal pulmonary artery systolic pressure. The tricuspid regurgitant velocity is 2.28 m/s, and  with an assumed right atrial pressure of 8 mmHg, the estimated right ventricular systolic pressure is 0000000 mmHg. Left Atrium: Left atrial size was normal in size. Right  Atrium: Right atrial size was upper normal. Pericardium: There is no evidence of pericardial effusion. Mitral Valve: The mitral valve is grossly normal. No evidence of mitral valve regurgitation. MV peak gradient, 3.7 mmHg. The mean mitral valve gradient is 1.0 mmHg. Tricuspid Valve: The tricuspid valve is grossly normal. Tricuspid valve regurgitation is mild. Aortic Valve: The aortic valve is tricuspid. Aortic valve regurgitation is trivial. Aortic valve mean gradient measures 4.0 mmHg. Aortic valve peak gradient measures 8.2 mmHg. Aortic valve area, by VTI measures 2.07 cm. Pulmonic Valve: The pulmonic valve was grossly normal. Pulmonic valve  regurgitation is trivial. Aorta: The aortic root is normal in size and structure and aortic dilatation noted. There is mild dilatation of the ascending aorta, measuring 40 mm. Venous: The inferior vena cava is normal in size with less than 50% respiratory variability, suggesting right atrial pressure of 8 mmHg. IAS/Shunts: No atrial level shunt detected by color flow Doppler.  LEFT VENTRICLE PLAX 2D LVIDd:         5.32 cm  Diastology LVIDs:         3.27 cm  LV e' medial:    12.40 cm/s LV PW:         0.81 cm  LV E/e' medial:  7.6 LV IVS:        0.76 cm  LV e' lateral:   15.30 cm/s LVOT diam:     2.00 cm  LV E/e' lateral: 6.1 LV SV:         75 LV SV Index:   35 LVOT Area:     3.14 cm  RIGHT VENTRICLE RV Basal diam:  4.07 cm RV Mid diam:    3.50 cm RV S prime:     14.50 cm/s TAPSE (M-mode): 3.5 cm LEFT ATRIUM             Index       RIGHT ATRIUM           Index LA diam:        4.00 cm 1.85 cm/m  RA Area:     22.90 cm LA Vol (A2C):   79.3 ml 36.61 ml/m RA Volume:   73.80 ml  34.07 ml/m LA Vol (A4C):   39.5 ml 18.24 ml/m LA Biplane Vol: 58.6 ml 27.05 ml/m  AORTIC VALVE AV Area (Vmax):    2.46 cm AV Area (Vmean):   2.30 cm AV Area (VTI):     2.07 cm AV Vmax:           143.00 cm/s AV Vmean:          96.000 cm/s AV VTI:            0.362 m AV Peak Grad:      8.2 mmHg AV Mean Grad:      4.0 mmHg LVOT Vmax:         112.00 cm/s LVOT Vmean:        70.400 cm/s LVOT VTI:          0.239 m LVOT/AV VTI ratio: 0.66  AORTA Ao Root diam: 3.70 cm Ao Asc diam:  4.00 cm MITRAL VALVE               TRICUSPID VALVE MV Area (PHT): 4.08 cm    TR Peak grad:   20.8 mmHg MV Area VTI:   1.75 cm    TR Vmax:        228.00 cm/s MV Peak grad:  3.7 mmHg MV Mean grad:  1.0  mmHg    SHUNTS MV Vmax:       0.97 m/s    Systemic VTI:  0.24 m MV Vmean:      44.6 cm/s   Systemic Diam: 2.00 cm MV Decel Time: 186 msec MV E velocity: 94.00 cm/s MV A velocity: 74.40 cm/s MV E/A ratio:  1.26 Rozann Lesches Weber Electronically signed by Rozann Lesches Weber  Signature Date/Time: 01/11/2021/11:33:55 AM    Final     EKG: SR rate 55 LAD no acute ST changes 01/10/21 SR RAD normal    ASSESSMENT AND PLAN:   1. Chest Pain:  Normal cath in 2016 atypical pain with moderate LAD disease by cardiac CTA/FFR CT 03/22/20 Continue ASA/Statin  Not on beta blocker due to low resting HR And BP  Pain 01/11/21 appears to be related to his EGD with biopsy and GI w/u CTA negative PE CXR NAD r/o negative troponin and no acute ECG changes TTE normal EF no RWMAls  2. HLD:  On statin labs with primary   3. Murmur: 2/6 SEM AV sclerosis trivial AR on TTE 01/11/21  4. GI:  post EGD/colon 01/10/21 Barrett's and diverticulosis f/u GI constipation and weight loss   F/U in a year    Signed: Jenkins Rouge 01/16/2021, 10:41 AM

## 2021-01-16 ENCOUNTER — Ambulatory Visit: Payer: Medicare Other | Admitting: Cardiovascular Disease

## 2021-01-16 ENCOUNTER — Ambulatory Visit (INDEPENDENT_AMBULATORY_CARE_PROVIDER_SITE_OTHER): Payer: Medicare Other | Admitting: Cardiovascular Disease

## 2021-01-16 ENCOUNTER — Other Ambulatory Visit: Payer: Self-pay

## 2021-01-16 ENCOUNTER — Encounter: Payer: Self-pay | Admitting: Cardiovascular Disease

## 2021-01-16 VITALS — BP 115/76 | HR 54 | Resp 18 | Ht 75.0 in | Wt 195.4 lb

## 2021-01-16 DIAGNOSIS — I2510 Atherosclerotic heart disease of native coronary artery without angina pectoris: Secondary | ICD-10-CM

## 2021-01-16 DIAGNOSIS — E782 Mixed hyperlipidemia: Secondary | ICD-10-CM | POA: Diagnosis not present

## 2021-01-16 DIAGNOSIS — R079 Chest pain, unspecified: Secondary | ICD-10-CM

## 2021-01-16 DIAGNOSIS — K22710 Barrett's esophagus with low grade dysplasia: Secondary | ICD-10-CM | POA: Diagnosis not present

## 2021-01-16 NOTE — Patient Instructions (Signed)
Medication Instructions:  Your physician recommends that you continue on your current medications as directed. Please refer to the Current Medication list given to you today.  *If you need a refill on your cardiac medications before your next appointment, please call your pharmacy*   Lab Work: None today  If you have labs (blood work) drawn today and your tests are completely normal, you will receive your results only by: [place] (if you have MyChart) OR A paper copy in the mail If you have any lab test that is abnormal or we need to change your treatment, we will call you to review the results.   Testing/Procedures: Your physician recommends that you continue on your current medications as directed. Please refer to the Current Medication list given to you today.    Follow-Up: At [facility], you and your health needs are our priority.  As part of our continuing mission to provide you with exceptional heart care, we have created designated Provider Care Teams.  These Care Teams include your primary Cardiologist (physician) and Advanced Practice Providers (APPs -  Physician Assistants and Nurse Practitioners) who all work together to provide you with the care you need, when you need it.  We recommend signing up for the patient portal called "MyChart".  Sign up information is provided on this After Visit Summary.  MyChart is used to connect with patients for Virtual Visits (Telemedicine).  Patients are able to view lab/test results, encounter notes, upcoming appointments, etc.  Non-urgent messages can be sent to your provider as well.   To learn more about what you can do with MyChart, go to [email or web address].    Your next appointment:   12 month(s)  The format for your next appointment:   In Person  Provider:   Jenkins Rouge, MD   Other Instructions None

## 2021-01-18 ENCOUNTER — Encounter (HOSPITAL_COMMUNITY): Payer: Self-pay | Admitting: Gastroenterology

## 2021-01-18 DIAGNOSIS — E663 Overweight: Secondary | ICD-10-CM | POA: Diagnosis not present

## 2021-01-18 DIAGNOSIS — Z6825 Body mass index (BMI) 25.0-25.9, adult: Secondary | ICD-10-CM | POA: Diagnosis not present

## 2021-01-18 DIAGNOSIS — K219 Gastro-esophageal reflux disease without esophagitis: Secondary | ICD-10-CM | POA: Diagnosis not present

## 2021-01-18 DIAGNOSIS — M1991 Primary osteoarthritis, unspecified site: Secondary | ICD-10-CM | POA: Diagnosis not present

## 2021-01-18 DIAGNOSIS — R0789 Other chest pain: Secondary | ICD-10-CM | POA: Diagnosis not present

## 2021-01-18 DIAGNOSIS — E782 Mixed hyperlipidemia: Secondary | ICD-10-CM | POA: Diagnosis not present

## 2021-01-18 DIAGNOSIS — N400 Enlarged prostate without lower urinary tract symptoms: Secondary | ICD-10-CM | POA: Diagnosis not present

## 2021-02-07 DIAGNOSIS — T1512XA Foreign body in conjunctival sac, left eye, initial encounter: Secondary | ICD-10-CM | POA: Diagnosis not present

## 2021-02-20 ENCOUNTER — Ambulatory Visit: Payer: Medicare Other | Admitting: Cardiovascular Disease

## 2021-02-20 DIAGNOSIS — Z23 Encounter for immunization: Secondary | ICD-10-CM | POA: Diagnosis not present

## 2021-02-20 DIAGNOSIS — Z6825 Body mass index (BMI) 25.0-25.9, adult: Secondary | ICD-10-CM | POA: Diagnosis not present

## 2021-02-20 DIAGNOSIS — E663 Overweight: Secondary | ICD-10-CM | POA: Diagnosis not present

## 2021-02-20 DIAGNOSIS — S39012A Strain of muscle, fascia and tendon of lower back, initial encounter: Secondary | ICD-10-CM | POA: Diagnosis not present

## 2021-02-20 DIAGNOSIS — R39198 Other difficulties with micturition: Secondary | ICD-10-CM | POA: Diagnosis not present

## 2021-03-13 DIAGNOSIS — M5416 Radiculopathy, lumbar region: Secondary | ICD-10-CM | POA: Diagnosis not present

## 2021-03-13 DIAGNOSIS — M461 Sacroiliitis, not elsewhere classified: Secondary | ICD-10-CM | POA: Diagnosis not present

## 2021-03-13 DIAGNOSIS — R1030 Lower abdominal pain, unspecified: Secondary | ICD-10-CM | POA: Diagnosis not present

## 2021-03-13 DIAGNOSIS — M545 Low back pain, unspecified: Secondary | ICD-10-CM | POA: Diagnosis not present

## 2021-04-10 DIAGNOSIS — Z6826 Body mass index (BMI) 26.0-26.9, adult: Secondary | ICD-10-CM | POA: Diagnosis not present

## 2021-04-10 DIAGNOSIS — J22 Unspecified acute lower respiratory infection: Secondary | ICD-10-CM | POA: Diagnosis not present

## 2021-04-10 DIAGNOSIS — R0981 Nasal congestion: Secondary | ICD-10-CM | POA: Diagnosis not present

## 2021-04-13 DIAGNOSIS — U071 COVID-19: Secondary | ICD-10-CM | POA: Diagnosis not present

## 2021-04-13 DIAGNOSIS — G43909 Migraine, unspecified, not intractable, without status migrainosus: Secondary | ICD-10-CM | POA: Diagnosis not present

## 2021-04-27 DIAGNOSIS — L821 Other seborrheic keratosis: Secondary | ICD-10-CM | POA: Diagnosis not present

## 2021-04-27 DIAGNOSIS — D485 Neoplasm of uncertain behavior of skin: Secondary | ICD-10-CM | POA: Diagnosis not present

## 2021-04-27 DIAGNOSIS — D692 Other nonthrombocytopenic purpura: Secondary | ICD-10-CM | POA: Diagnosis not present

## 2021-04-27 DIAGNOSIS — Z85828 Personal history of other malignant neoplasm of skin: Secondary | ICD-10-CM | POA: Diagnosis not present

## 2021-04-27 DIAGNOSIS — L820 Inflamed seborrheic keratosis: Secondary | ICD-10-CM | POA: Diagnosis not present

## 2021-04-27 DIAGNOSIS — C44311 Basal cell carcinoma of skin of nose: Secondary | ICD-10-CM | POA: Diagnosis not present

## 2021-04-27 DIAGNOSIS — L570 Actinic keratosis: Secondary | ICD-10-CM | POA: Diagnosis not present

## 2021-04-29 DIAGNOSIS — Z20828 Contact with and (suspected) exposure to other viral communicable diseases: Secondary | ICD-10-CM | POA: Diagnosis not present

## 2021-05-10 ENCOUNTER — Encounter (INDEPENDENT_AMBULATORY_CARE_PROVIDER_SITE_OTHER): Payer: Self-pay | Admitting: Gastroenterology

## 2021-05-10 ENCOUNTER — Ambulatory Visit (INDEPENDENT_AMBULATORY_CARE_PROVIDER_SITE_OTHER): Payer: Medicare Other | Admitting: Gastroenterology

## 2021-05-10 ENCOUNTER — Other Ambulatory Visit: Payer: Self-pay

## 2021-05-10 VITALS — BP 114/78 | HR 65 | Temp 97.9°F | Ht 75.0 in | Wt 204.8 lb

## 2021-05-10 DIAGNOSIS — K581 Irritable bowel syndrome with constipation: Secondary | ICD-10-CM | POA: Diagnosis not present

## 2021-05-10 NOTE — Progress Notes (Signed)
Frank Weber, M.D. Gastroenterology & Hepatology [facility] [address] [place], [place] [address]  Primary Care Physician: [name], MD [address] [place] [address]  I will communicate my assessment and recommendations to the referring MD via EMR.  Problems: IBS-C GERD  History of Present Illness: Frank Weber is a 70 y.o. male with past medical history of IBS-C, GERD, hypothyroidism, hyperlipidemia, who presents for follow up of constipation.  The patient was last seen on 01/08/2021. At that time, the patient was advised to restart taking the MiraLAX compliantly.  He was scheduled to undergo an EGD and a colonoscopy with findings described below..  Patient had a respiratory infection 3 weeks ago which she thinks was related to influenza. In Thanksgiving he was with family and states at least 53 people got a viral repsiratory infection and he has felt worsening respiratory symptoms.  However, he feels overall better compared to 3 weeks ago when he was initially having fever.  Denies having any more chest pain or any other complaints not related to the respiratory symptoms.  Still has some constipation periodically, which got worse after he was given oxycodone for his flu symptoms. Tries to take Miralax at least once a day, sometimes twice a day.  The patient denies having any nausea, vomiting, recent fever, chills, hematochezia, melena, heartburn, hematemesis, abdominal distention, abdominal pain, diarrhea, jaundice, pruritus or weight loss.  Last POE:4235 - Salmon-colored mucosa suspicious for short-segment Barrett's esophagus. Biopsied - reflux changes. - Erythematous mucosa in the antrum. Biopsied - reactive gastropathy neg for HP. - Normal examined duodenum.  Last Colonoscopy: 2022 - Two 2 mm polyps in the ascending colon, removed with a cold biopsy forceps. Resected and retrieved. Path TA x2. - Nine  3 to 8 mm polyps in the descending colon, in the transverse colon and in the ascending colon, removed with a cold snare. Resected and retrieved. Path 8/9 tubular adenomas - Diverticulosis in the sigmoid colon and in the descending colon. - Non-bleeding internal hemorrhoids.  Repeat colonoscopy in 2 years.  Past Medical History: Past Medical History:  Diagnosis Date   Arthritis    right knee   Constipation    Frequency of urination    GERD (gastroesophageal reflux disease)    Heart murmur    "slight"    History of kidney stones    History of melanoma excision    2012--  NECK/ HEAD   History of squamous cell carcinoma excision    RIGHT EAR   Hyperlipidemia    Hypothyroidism    IBS (irritable bowel syndrome)    states slight   Lumbar stenosis    L5 - S1   Migraines    Peripheral neuropathy    legs and feet   Right flank pain    Right ureteral stone    Urgency of urination    Wears glasses     Past Surgical History: Past Surgical History:  Procedure Laterality Date   BIOPSY  01/10/2021   Procedure: BIOPSY;  Surgeon: [name], MD;  [facility];  Service: Gastroenterology;;   CARDIAC CATHETERIZATION  07-08-2007  dr Tami Ribas    no significant CAD, preserved LVF, ef 50%//  30% pLAD   CARDIOVASCULAR STRESS TEST  06-20-2011  dr croitoru   Low risk study/  normal perfusion scan showing attenuation artifact in the anterior region of myocardium with no ischemia , infarct or scar/  normal LV funciton and wall motion ,  ef 62%   COLONOSCOPY N/A 02/17/2013   Rehman:examination performed to cecum. mild sigmoid colon diverticulosis, 4 small polyps ablated via cold biopsy from rectosigmoid junction (hyperplastic). External hemorrhoids.   COLONOSCOPY WITH PROPOFOL N/A 01/10/2021   Procedure: COLONOSCOPY WITH PROPOFOL;  Surgeon: [name], MD;  [facility];  Service: Gastroenterology;  Laterality: N/A;   CYSTOSCOPY W/ URETERAL STENT  PLACEMENT Right 06/18/2015   Procedure: CYSTOSCOPY WITH RIGHT RETROGRADE PYELOGRAM RIGHT URETERAL STENT PLACEMENT;  Surgeon: [name], MD;  Location: [facility];  Service: Urology;  Laterality: Right;   CYSTOSCOPY/URETEROSCOPY/HOLMIUM LASER/STENT PLACEMENT Right 06/27/2015   Procedure: CYSTOSCOPY RIGHT URETEROSCOPY  STENT REMOVAL; STONE EXTRACTION WITH BASKET;  Surgeon: [name], MD;  Location: [facility];  Service: Urology;  Laterality: Right;   ESOPHAGOGASTRODUODENOSCOPY (EGD) WITH PROPOFOL N/A 01/10/2021   Procedure: ESOPHAGOGASTRODUODENOSCOPY (EGD) WITH PROPOFOL;  Surgeon: [name], MD;  [facility];  Service: Gastroenterology;  Laterality: N/A;  11:00, moved up per [name] - pt knows arrival time   [place] EXCISIONAL BIOPSY     HOLMIUM LASER APPLICATION Right 38/03/1750   Procedure: HOLMIUM LASER LITHOTRIPSY ;  Surgeon: [name], MD;  Location: [place];  Service: Urology;  Laterality: Right;   I & D EXTREMITY Right 07/14/2018   Procedure: IRRIGATION AND DEBRIDEMENT RIGHT KNEE WITH CLOSURE;  Surgeon: Nicholes Stairs, MD;  Location: [place];  Service: Orthopedics;  Laterality: Right;   INGUINAL HERNIA REPAIR Left 10/21/2007   KNEE ARTHROSCOPY W/ ACL RECONSTRUCTION Right 1992   MENISCUS REPAIR Right    2019, dr Theda Sers    POLYPECTOMY  01/10/2021   Procedure: POLYPECTOMY;  Surgeon: [name], MD;  [facility];  Service: Gastroenterology;;   SHOULDER ARTHROSCOPY WITH OPEN ROTATOR CUFF REPAIR Left 07/27/2014   Procedure: LEFT SHOULDER ARTHROSCOPY WITH MINI OPEN ROTATOR CUFF REPAIR;  Surgeon: [name], MD;  Location: [place];  Service: Orthopedics;  Laterality: Left;   SHOULDER ARTHROSCOPY WITH OPEN ROTATOR CUFF REPAIR Right 2006   TONSILLECTOMY AND ADENOIDECTOMY  age 18   TOTAL KNEE ARTHROPLASTY Right 07/10/2018   Procedure: TOTAL KNEE ARTHROPLASTY;  Surgeon:  [name], MD;  Location: [facility];  Service: Orthopedics;  Laterality: Right;   TRANSTHORACIC ECHOCARDIOGRAM  07/30/2007   normal LVF, ef >55%/  mild AR, MR , PR and TR/  mild AV sclerosis without stenosis/    URETEROSCOPY Right 06/18/2015   Procedure: URETEROSCOPY;  Surgeon: [name], MD;  Location: [facility];  Service: Urology;  Laterality: Right;    Family History: Family History  Problem Relation Age of Onset   Colon cancer Other        Age of Onset-late 95's    Social History: Social History   Tobacco Use  Smoking Status Former   Packs/day: 0.00   Years: 30.00   Pack years: 0.00   Types: Cigars, Cigarettes  Smokeless Tobacco Never   Social History   Substance and Sexual Activity  Alcohol Use Yes   Comment: seldom    Social History   Substance and Sexual Activity  Drug Use No    Allergies: Allergies  Allergen Reactions   Sulfa Antibiotics Anaphylaxis, Shortness Of Breath and Rash   Coconut Oil Rash    Medications: Current Outpatient Medications  Medication Sig Dispense Refill   ALPRAZOLAM XR 1 MG 24 hr tablet Take 1 mg by mouth at bedtime.     aspirin  EC 81 MG tablet Take 1 tablet (81 mg total) by mouth daily. Swallow whole. 90 tablet 3   atorvastatin (LIPITOR) 20 MG tablet Take 20 mg by mouth daily.     benzocaine (ANBESOL) 10 % mucosal gel Use as directed 1 application in the mouth or throat as needed for mouth pain.     calcium elemental as carbonate (TUMS ULTRA 1000) 400 MG chewable tablet Chew 2,000 mg by mouth daily as needed for heartburn.     Cholecalciferol (DIALYVITE VITAMIN D 5000) 125 MCG (5000 UT) capsule Take 5,000 Units by mouth daily.     gabapentin (NEURONTIN) 300 MG capsule Take 900 mg by mouth 4 (four) times daily.     levothyroxine (SYNTHROID, LEVOTHROID) 25 MCG tablet Take 25 mcg by mouth daily before breakfast.     Lidocaine HCl 4 % CREA Apply 1 application topically daily as needed (pain).     LORazepam (ATIVAN) 0.5 MG tablet Take  0.5 mg by mouth 3 (three) times daily as needed for anxiety.     magnesium 30 MG tablet Take 30 mg by mouth daily.     nitroGLYCERIN (NITROSTAT) 0.4 MG SL tablet Place 1 tablet (0.4 mg total) under the tongue every 5 (five) minutes as needed. 25 tablet 3   Polyethyl Glycol-Propyl Glycol (SYSTANE ULTRA) 0.4-0.3 % SOLN Place 2 drops into both eyes as needed (for dry eyes).      polyethylene glycol (MIRALAX / GLYCOLAX) 17 g packet Take 17 g by mouth 2 (two) times daily.     Probiotic Product (TRUBIOTICS PO) Take 1 capsule by mouth at bedtime.     promethazine (PHENERGAN) 12.5 MG tablet Take 1 tablet (12.5 mg total) by mouth every 6 (six) hours as needed for nausea or vomiting. 20 tablet 0   rizatriptan (MAXALT-MLT) 10 MG disintegrating tablet Take 10 mg by mouth as needed (for migraines and may repeat once in 2 hours, if no relief).      vitamin k 100 MCG tablet Take 100 mcg by mouth daily.     No current facility-administered medications for this visit.    Review of Systems: GENERAL: negative for malaise, night sweats HEENT: No changes in hearing or vision, no nose bleeds or other nasal problems. NECK: Negative for lumps, goiter, pain and significant neck swelling RESPIRATORY: Negative for cough, wheezing CARDIOVASCULAR: Negative for chest pain, leg swelling, palpitations, orthopnea GI: SEE HPI MUSCULOSKELETAL: Negative for joint pain or swelling, back pain, and muscle pain. SKIN: Negative for lesions, rash PSYCH: Negative for sleep disturbance, mood disorder and recent psychosocial stressors. HEMATOLOGY Negative for prolonged bleeding, bruising easily, and swollen nodes. ENDOCRINE: Negative for cold or heat intolerance, polyuria, polydipsia and goiter. NEURO: negative for tremor, gait imbalance, syncope and seizures. The remainder of the review of systems is noncontributory.   Physical Exam: BP 114/78 (BP Location: Left Arm, Patient Position: Sitting, Cuff Size: Large)   Pulse 65    Temp 97.9 F (36.6 C) (Oral)   Ht 6\' 3"  (1.905 m)   Wt 204 lb 12.8 oz (92.9 kg)   BMI 25.60 kg/m  GENERAL: The patient is AO x3, in no acute distress. HEENT: Head is normocephalic and atraumatic. EOMI are intact. Mouth is well hydrated and without lesions. NECK: Supple. No masses LUNGS: Clear to auscultation. No presence of rhonchi/wheezing/rales. Adequate chest expansion HEART: RRR, normal s1 and s2. ABDOMEN: Soft, nontender, no guarding, no peritoneal signs, and nondistended. BS +. No masses. EXTREMITIES: Without any cyanosis, clubbing, rash, lesions  or edema. NEUROLOGIC: AOx3, no focal motor deficit. SKIN: no jaundice, no rashes  Imaging/Labs: as above  I personally reviewed and interpreted the available labs, imaging and endoscopic files.  Impression and Plan: LATHANIEL LEGATE is a 70 y.o. male with past medical history of IBS-C, GERD, hypothyroidism, hyperlipidemia, who presents for follow up of constipation.  In general, the patient has presented improvement of his GERD symptoms while taking his current PPI which he should continue taking at the same dosage.  Unfortunately, he was exposed recently to opiates which are known to cause worsening constipation.  I advised the patient to increase the intake of MiraLAX to twice a day dosing daily so he can achieve better control of his bowel movements.  He understood and agreed.  - Try to take Miralax consistently once or twice a day to increase bowel movement frequency  All questions were answered.      [name], MD Gastroenterology and Hepatology [facility]

## 2021-05-10 NOTE — Patient Instructions (Signed)
Try to take Miralax consistently once or twice a day to increase bowel movement frequency

## 2021-05-16 DIAGNOSIS — J22 Unspecified acute lower respiratory infection: Secondary | ICD-10-CM | POA: Diagnosis not present

## 2021-05-16 DIAGNOSIS — R0981 Nasal congestion: Secondary | ICD-10-CM | POA: Diagnosis not present

## 2021-05-28 DIAGNOSIS — C44311 Basal cell carcinoma of skin of nose: Secondary | ICD-10-CM | POA: Diagnosis not present

## 2021-05-28 DIAGNOSIS — Z85828 Personal history of other malignant neoplasm of skin: Secondary | ICD-10-CM | POA: Diagnosis not present

## 2021-06-07 ENCOUNTER — Observation Stay (HOSPITAL_COMMUNITY)
Admission: EM | Admit: 2021-06-07 | Discharge: 2021-06-08 | Disposition: A | Discharge status: Home / Self Care | Payer: Medicare Other | Attending: Internal Medicine | Admitting: Internal Medicine

## 2021-06-07 ENCOUNTER — Other Ambulatory Visit: Payer: Self-pay

## 2021-06-07 ENCOUNTER — Encounter (HOSPITAL_COMMUNITY): Payer: Self-pay

## 2021-06-07 ENCOUNTER — Emergency Department (HOSPITAL_COMMUNITY): Payer: Medicare Other

## 2021-06-07 ENCOUNTER — Telehealth: Payer: Self-pay | Admitting: Cardiovascular Disease

## 2021-06-07 ENCOUNTER — Other Ambulatory Visit (HOSPITAL_COMMUNITY): Payer: Medicare Other

## 2021-06-07 DIAGNOSIS — R079 Chest pain, unspecified: Secondary | ICD-10-CM | POA: Diagnosis present

## 2021-06-07 DIAGNOSIS — I2510 Atherosclerotic heart disease of native coronary artery without angina pectoris: Secondary | ICD-10-CM | POA: Insufficient documentation

## 2021-06-07 DIAGNOSIS — Z87891 Personal history of nicotine dependence: Secondary | ICD-10-CM | POA: Insufficient documentation

## 2021-06-07 DIAGNOSIS — R072 Precordial pain: Principal | ICD-10-CM | POA: Insufficient documentation

## 2021-06-07 DIAGNOSIS — Z20822 Contact with and (suspected) exposure to covid-19: Secondary | ICD-10-CM | POA: Insufficient documentation

## 2021-06-07 DIAGNOSIS — E039 Hypothyroidism, unspecified: Secondary | ICD-10-CM | POA: Diagnosis not present

## 2021-06-07 DIAGNOSIS — Z7982 Long term (current) use of aspirin: Secondary | ICD-10-CM | POA: Insufficient documentation

## 2021-06-07 DIAGNOSIS — Z96651 Presence of right artificial knee joint: Secondary | ICD-10-CM | POA: Insufficient documentation

## 2021-06-07 DIAGNOSIS — E782 Mixed hyperlipidemia: Secondary | ICD-10-CM

## 2021-06-07 DIAGNOSIS — R001 Bradycardia, unspecified: Secondary | ICD-10-CM | POA: Diagnosis not present

## 2021-06-07 DIAGNOSIS — Z79899 Other long term (current) drug therapy: Secondary | ICD-10-CM | POA: Insufficient documentation

## 2021-06-07 DIAGNOSIS — Z85820 Personal history of malignant melanoma of skin: Secondary | ICD-10-CM | POA: Insufficient documentation

## 2021-06-07 DIAGNOSIS — R0789 Other chest pain: Secondary | ICD-10-CM | POA: Diagnosis not present

## 2021-06-07 LAB — BASIC METABOLIC PANEL
Anion gap: 3 — ABNORMAL LOW (ref 5–15)
BUN: 14 mg/dL (ref 8–23)
CO2: 29 mmol/L (ref 22–32)
Calcium: 9.6 mg/dL (ref 8.9–10.3)
Chloride: 106 mmol/L (ref 98–111)
Creatinine, Ser: 1.25 mg/dL — ABNORMAL HIGH (ref 0.61–1.24)
GFR, Estimated: >60 mL/min (ref >60)
Glucose, Bld: 98 mg/dL (ref 70–99)
Potassium: 4.7 mmol/L (ref 3.5–5.1)
Sodium: 138 mmol/L (ref 135–145)

## 2021-06-07 LAB — CBC
HCT: 44.8 % (ref 39.0–52.0)
Hemoglobin: 14.6 g/dL (ref 13.0–17.0)
MCH: 31.7 pg (ref 26.0–34.0)
MCHC: 32.6 g/dL (ref 30.0–36.0)
MCV: 97.4 fL (ref 80.0–100.0)
Platelets: 156 10*3/uL (ref 150–400)
RBC: 4.6 MIL/uL (ref 4.22–5.81)
RDW: 12.6 % (ref 11.5–15.5)
WBC: 4.5 10*3/uL (ref 4.0–10.5)
nRBC: 0 % (ref 0.0–0.2)

## 2021-06-07 LAB — RESP PANEL BY RT-PCR (FLU A&B, COVID) ARPGX2
Influenza A by PCR: NEGATIVE
Influenza B by PCR: NEGATIVE
SARS Coronavirus 2 by RT PCR: NEGATIVE

## 2021-06-07 LAB — TROPONIN I (HIGH SENSITIVITY)
Troponin I (High Sensitivity): 3 ng/L (ref <18)
Troponin I (High Sensitivity): 3 ng/L (ref <18)

## 2021-06-07 MED ORDER — ASPIRIN EC 81 MG PO TBEC
81.0000 mg | DELAYED_RELEASE_TABLET | Freq: Every day | ORAL | Status: DC
  Administered 2021-06-07 – 2021-06-08 (×2): 81 mg via ORAL
  Filled 2021-06-07 (×2): qty 1

## 2021-06-07 MED ORDER — ACETAMINOPHEN 650 MG RE SUPP: 650.0000 mg | Freq: Four times a day (QID) | RECTAL | Status: DC | PRN

## 2021-06-07 MED ORDER — ENSURE ENLIVE PO LIQD
237.0000 mL | Freq: Two times a day (BID) | ORAL | Status: DC
  Administered 2021-06-08: 12:00:00 237 mL via ORAL

## 2021-06-07 MED ORDER — ALPRAZOLAM ER 1 MG PO TB24: 1.0000 mg | ORAL_TABLET | Freq: Every day | ORAL | Status: DC

## 2021-06-07 MED ORDER — GABAPENTIN 300 MG PO CAPS
900.0000 mg | ORAL_CAPSULE | Freq: Four times a day (QID) | ORAL | Status: DC
  Administered 2021-06-07 – 2021-06-08 (×4): 900 mg via ORAL
  Filled 2021-06-07 (×4): qty 3

## 2021-06-07 MED ORDER — REGADENOSON 0.4 MG/5ML IV SOLN
0.4000 mg | Freq: Once | INTRAVENOUS | Status: DC
  Filled 2021-06-07: qty 5

## 2021-06-07 MED ORDER — LEVOTHYROXINE SODIUM 25 MCG PO TABS
25.0000 ug | ORAL_TABLET | Freq: Every day | ORAL | Status: DC
  Administered 2021-06-08: 06:00:00 25 ug via ORAL
  Filled 2021-06-07: qty 1

## 2021-06-07 MED ORDER — ENOXAPARIN SODIUM 40 MG/0.4ML IJ SOSY
40.0000 mg | PREFILLED_SYRINGE | INTRAMUSCULAR | Status: DC
  Administered 2021-06-07: 15:00:00 40 mg via SUBCUTANEOUS
  Filled 2021-06-07: qty 0.4

## 2021-06-07 MED ORDER — ONDANSETRON HCL 4 MG PO TABS: 4.0000 mg | ORAL_TABLET | Freq: Four times a day (QID) | ORAL | Status: DC | PRN

## 2021-06-07 MED ORDER — ALPRAZOLAM 1 MG PO TABS
1.0000 mg | ORAL_TABLET | Freq: Every day | ORAL | Status: DC
  Administered 2021-06-07: 22:00:00 1 mg via ORAL
  Filled 2021-06-07: qty 1

## 2021-06-07 MED ORDER — ASPIRIN 81 MG PO CHEW
324.0000 mg | CHEWABLE_TABLET | Freq: Once | ORAL | Status: AC
  Administered 2021-06-07: 12:00:00 324 mg via ORAL
  Filled 2021-06-07: qty 4

## 2021-06-07 MED ORDER — ATORVASTATIN CALCIUM 20 MG PO TABS
20.0000 mg | ORAL_TABLET | Freq: Every day | ORAL | Status: DC
  Administered 2021-06-07 – 2021-06-08 (×2): 20 mg via ORAL
  Filled 2021-06-07: qty 2
  Filled 2021-06-07: qty 1

## 2021-06-07 MED ORDER — ONDANSETRON HCL 4 MG/2ML IJ SOLN: 4.0000 mg | Freq: Four times a day (QID) | INTRAMUSCULAR | Status: DC | PRN

## 2021-06-07 MED ORDER — ACETAMINOPHEN 325 MG PO TABS
650.0000 mg | ORAL_TABLET | Freq: Four times a day (QID) | ORAL | Status: DC | PRN
  Administered 2021-06-07: 21:00:00 650 mg via ORAL
  Filled 2021-06-07: qty 2

## 2021-06-07 MED ORDER — KETOROLAC TROMETHAMINE 30 MG/ML IJ SOLN
30.0000 mg | Freq: Once | INTRAMUSCULAR | Status: AC
  Administered 2021-06-07: 12:00:00 30 mg via INTRAVENOUS
  Filled 2021-06-07: qty 1

## 2021-06-07 NOTE — Telephone Encounter (Signed)
Spoke with pt in regards to chest pain.  Pt reports taking 3 NTG today with continued chest discomfort.  Pt also has nausea, left side chest pain and pain with deep breath.  I advised pt to report to the ED urgently.  Pt verbalizes understanding and will report to the ED.

## 2021-06-07 NOTE — Telephone Encounter (Signed)
Pt c/o of Chest Pain: STAT if CP now or developed within 24 hours  1. Are you having CP right now? Yes, goes from the center of his chest to left hand side of his shoulder. Have tingling in both shoulders more so his left arm  2. Are you experiencing any other symptoms (ex. SOB, nausea, vomiting, sweating)? nausea  3. How long have you been experiencing CP? week  4. Is your CP continuous or coming and going? Coming and going. And sometimes in sharp  5. Have you taken Nitroglycerin? Took 3 this morning ?

## 2021-06-07 NOTE — ED Notes (Signed)
Pt given a warm blanket.  Dietary called for a tray

## 2021-06-07 NOTE — ED Notes (Signed)
Pt to room 324

## 2021-06-07 NOTE — ED Notes (Signed)
Pt given blanket and box of tissues.

## 2021-06-07 NOTE — H&P (Signed)
History and Physical  Frank Weber WLN:[id number] DOB: Nov 28, 1950 DOA: 06/07/2021   PCP: [name], MD   Patient coming from: Home  Chief Complaint: chest pain  HPI:  Frank Weber is a 70 y.o. male with medical history of coronary artery disease and LAD by cardiac CTA done on 12/23/2019, anxiety, hyperlipidemia, hypothyroidism, peripheral neuropathy presenting with substernal chest pain for the past week.  Patient states that he had URI type symptoms around Thanksgiving time with a cough and chest congestion.  He has some chest discomfort at that time, but he states that for the past week this type of chest discomfort is different.  He describes it as constant substernal dull chest pain with occasional sharp pain.  It occurs at rest,, and it is not necessarily worsened by exertion.  He states that certain movements will make it worse as well as coughing.  He denies any shortness of breath, fevers, chills, vomiting.  He has had some nausea but denies any dizziness or syncope.  He denies any coughing hemoptysis.  The patient states that he quit smoking cigarettes about 2 months ago.  He previously smoked cigarettes with approximately 25-pack-year history.  In the morning of 06/07/2021, the patient took 3 nitroglycerin without relief.  As result, the patient came to the emergency department for further evaluation.  The patient does complain of some loose stools, but he states that he has been constipated and was taking MiraLAX and Dulcolax 2 days prior to this admission. ED Patient was afebrile hemodynamically stable with oxygen saturation 100% room air.  BMP showed sodium 130, potassium 4.7, bicarbonate 29, serum creatinine 1.25.  WBC 4.5, hemoglobin 14.6, platelets 1 56,000.  Troponin is 3.  EKG shows sinus bradycardia with no ST-T wave changes.  Chest x-ray was negative.  Assessment/Plan: Chest pain -Finish cycling troponins -Appreciate cardiology consult -Planning for Lexiscan in  the a.m. if persistent chest pain -Echocardiogram  Hyperlipidemia -Continue statin  Sinus bradycardia -Avoid AV nodal blockade -Avoid beta-blockers  Hypothyroidism -Continue Synthroid  Anxiety -Continue alprazolam -PDMP reviewed--patient receives alprazolam XR 1 mg #30 on a monthly basis  Peripheral neuropathy -Continue gabapentin        Past Medical History:  Diagnosis Date   Arthritis    right knee   Constipation    Frequency of urination    GERD (gastroesophageal reflux disease)    Heart murmur    "slight"    History of kidney stones    History of melanoma excision    2012--  NECK/ HEAD   History of squamous cell carcinoma excision    RIGHT EAR   Hyperlipidemia    Hypothyroidism    IBS (irritable bowel syndrome)    states slight   Lumbar stenosis    L5 - S1   Migraines    Peripheral neuropathy    legs and feet   Right flank pain    Right ureteral stone    Urgency of urination    Wears glasses    Past Surgical History:  Procedure Laterality Date   BIOPSY  01/10/2021   Procedure: BIOPSY;  Surgeon: Montez Morita, Quillian Quince, MD;  [facility];  Service: Gastroenterology;;   CARDIAC CATHETERIZATION  07-08-2007  dr Tami Ribas    no significant CAD, preserved LVF, ef 50%//  30% pLAD   CARDIOVASCULAR STRESS TEST  06-20-2011  dr croitoru   Low risk study/  normal perfusion scan showing attenuation artifact in the anterior region of myocardium  with no ischemia , infarct or scar/  normal LV funciton and wall motion , ef 62%   COLONOSCOPY N/A 02/17/2013   Rehman:examination performed to cecum. mild sigmoid colon diverticulosis, 4 small polyps ablated via cold biopsy from rectosigmoid junction (hyperplastic). External hemorrhoids.   COLONOSCOPY WITH PROPOFOL N/A 01/10/2021   Procedure: COLONOSCOPY WITH PROPOFOL;  Surgeon: [name], MD;  [facility];  Service: Gastroenterology;  Laterality: N/A;   CYSTOSCOPY W/ URETERAL STENT  PLACEMENT Right 06/18/2015   Procedure: CYSTOSCOPY WITH RIGHT RETROGRADE PYELOGRAM RIGHT URETERAL STENT PLACEMENT;  Surgeon: [name], MD;  Location: [facility];  Service: Urology;  Laterality: Right;   CYSTOSCOPY/URETEROSCOPY/HOLMIUM LASER/STENT PLACEMENT Right 06/27/2015   Procedure: CYSTOSCOPY RIGHT URETEROSCOPY  STENT REMOVAL; STONE EXTRACTION WITH BASKET;  Surgeon: [name], MD;  Location: [facility];  Service: Urology;  Laterality: Right;   ESOPHAGOGASTRODUODENOSCOPY (EGD) WITH PROPOFOL N/A 01/10/2021   Procedure: ESOPHAGOGASTRODUODENOSCOPY (EGD) WITH PROPOFOL;  Surgeon: [name], MD;  [facility];  Service: Gastroenterology;  Laterality: N/A;  11:00, moved up per [name] - pt knows arrival time   [place] EXCISIONAL BIOPSY     HOLMIUM LASER APPLICATION Right 27/08/5007   Procedure: HOLMIUM LASER LITHOTRIPSY ;  Surgeon: [name], MD;  Location: [facility];  Service: Urology;  Laterality: Right;   I & D EXTREMITY Right 07/14/2018   Procedure: IRRIGATION AND DEBRIDEMENT RIGHT KNEE WITH CLOSURE;  Surgeon: Nicholes Stairs, MD;  Location: [place];  Service: Orthopedics;  Laterality: Right;   INGUINAL HERNIA REPAIR Left 10/21/2007   KNEE ARTHROSCOPY W/ ACL RECONSTRUCTION Right 1992   MENISCUS REPAIR Right    2019, dr Theda Sers    POLYPECTOMY  01/10/2021   Procedure: POLYPECTOMY;  Surgeon: [name], MD;  [facility];  Service: Gastroenterology;;   SHOULDER ARTHROSCOPY WITH OPEN ROTATOR CUFF REPAIR Left 07/27/2014   Procedure: LEFT SHOULDER ARTHROSCOPY WITH MINI OPEN ROTATOR CUFF REPAIR;  Surgeon: [name], MD;  Location: [place];  Service: Orthopedics;  Laterality: Left;   SHOULDER ARTHROSCOPY WITH OPEN ROTATOR CUFF REPAIR Right 2006   TONSILLECTOMY AND ADENOIDECTOMY  age 55   TOTAL KNEE ARTHROPLASTY Right 07/10/2018   Procedure: TOTAL KNEE ARTHROPLASTY;  Surgeon:  [name], MD;  Location: [facility];  Service: Orthopedics;  Laterality: Right;   TRANSTHORACIC ECHOCARDIOGRAM  07/30/2007   normal LVF, ef >55%/  mild AR, MR , PR and TR/  mild AV sclerosis without stenosis/    URETEROSCOPY Right 06/18/2015   Procedure: URETEROSCOPY;  Surgeon: [name], MD;  [facility];  Service: Urology;  Laterality: Right;   Social History:  reports that he has quit smoking. His smoking use included cigars and cigarettes. He has never used smokeless tobacco. He reports current alcohol use. He reports that he does not use drugs.   Family History  Problem Relation Age of Onset   Colon cancer Other        Age of Onset-late 8's     Allergies  Allergen Reactions   Sulfa Antibiotics Anaphylaxis, Shortness Of Breath and Rash   Coconut Oil Rash     Prior to Admission medications   Medication Sig Start Date End Date Taking? Authorizing Provider  ALPRAZOLAM XR 1 MG 24 hr tablet Take 1 mg by mouth at bedtime. 11/17/19   [provider]  aspirin EC 81 MG tablet Take 1 tablet (81 mg total) by mouth  daily. Swallow whole. 12/24/19   Josue Hector, MD  atorvastatin (LIPITOR) 20 MG tablet Take 20 mg by mouth daily. 11/09/19   [provider]  benzocaine (ANBESOL) 10 % mucosal gel Use as directed 1 application in the mouth or throat as needed for mouth pain.    [provider]  calcium elemental as carbonate (TUMS ULTRA 1000) 400 MG chewable tablet Chew 2,000 mg by mouth daily as needed for heartburn.    [provider]  Cholecalciferol (DIALYVITE VITAMIN D 5000) 125 MCG (5000 UT) capsule Take 5,000 Units by mouth daily.    [provider]  gabapentin (NEURONTIN) 300 MG capsule Take 900 mg by mouth 4 (four) times daily.    [provider]  levothyroxine (SYNTHROID, LEVOTHROID) 25 MCG tablet Take 25 mcg by mouth daily before breakfast.    [provider]  Lidocaine HCl 4 % CREA Apply 1 application topically  daily as needed (pain).    [provider]  LORazepam (ATIVAN) 0.5 MG tablet Take 0.5 mg by mouth 3 (three) times daily as needed for anxiety.    [provider]  magnesium 30 MG tablet Take 30 mg by mouth daily.    [provider]  nitroGLYCERIN (NITROSTAT) 0.4 MG SL tablet Place 1 tablet (0.4 mg total) under the tongue every 5 (five) minutes as needed. 12/24/19   Josue Hector, MD  Polyethyl Glycol-Propyl Glycol (SYSTANE ULTRA) 0.4-0.3 % SOLN Place 2 drops into both eyes as needed (for dry eyes).     [provider]  polyethylene glycol (MIRALAX / GLYCOLAX) 17 g packet Take 17 g by mouth 2 (two) times daily.    [provider]  Probiotic Product (TRUBIOTICS PO) Take 1 capsule by mouth at bedtime.    [provider]  promethazine (PHENERGAN) 12.5 MG tablet Take 1 tablet (12.5 mg total) by mouth every 6 (six) hours as needed for nausea or vomiting. 01/11/21   Bonnielee Haff, MD  rizatriptan (MAXALT-MLT) 10 MG disintegrating tablet Take 10 mg by mouth as needed (for migraines and may repeat once in 2 hours, if no relief).     [provider]  vitamin k 100 MCG tablet Take 100 mcg by mouth daily.    [provider]    Review of Systems:  Constitutional:  No weight loss, night sweats, Fevers, chills, fatigue.  Head&Eyes: No headache.  No vision loss.  No eye pain or scotoma ENT:  No Difficulty swallowing,Tooth/dental problems,Sore throat,  No ear ache, post nasal drip,  Cardio-vascular:  No chest pain, Orthopnea, PND, swelling in lower extremities,  dizziness, palpitations  GI:  No  abdominal pain,vomiting, diarrhea, loss of appetite, hematochezia, melena, heartburn, indigestion, Resp:  No shortness of breath with exertion or at rest. No cough. No coughing up of blood .No wheezing.No chest wall deformity  Skin:  no rash or lesions.  GU:  no dysuria, change in color of urine, no urgency or frequency. No flank pain.   Musculoskeletal:  No joint pain or swelling. No decreased range of motion. No back pain.  Psych:  No change in mood or affect.  Neurologic: No headache, no dysesthesia, no focal weakness, no vision loss. No syncope  Physical Exam: Vitals:   06/07/21 1030 06/07/21 1100 06/07/21 1130 06/07/21 1200  BP: 100/67 100/61 108/70 114/70  Pulse: (!) 47 (!) 46 (!) 48 (!) 42  Resp: 13 18 (!) 22 14  Temp:      TempSrc:  SpO2: 100% 99% 100% 100%  Weight:      Height:       General:  A&O x 3, NAD, nontoxic, pleasant/cooperative Head/Eye: No conjunctival hemorrhage, no icterus, [place]/AT, No nystagmus ENT:  No icterus,  No thrush, good dentition, no pharyngeal exudate Neck:  No masses, no lymphadenpathy, no bruits CV:  RRR, no rub, no gallop, no S3 Lung:  CTAB, good air movement, no wheeze, no rhonchi Abdomen: soft/NT, +BS, nondistended, no peritoneal signs Ext: No cyanosis, No rashes, No petechiae, No lymphangitis, No edema Neuro: CNII-XII intact, strength 4/5 in bilateral upper and lower extremities, no dysmetria  Labs on Admission:  Basic Metabolic Panel: Recent Labs  Lab 06/07/21 1053  NA 138  K 4.7  CL 106  CO2 29  GLUCOSE 98  BUN 14  CREATININE 1.25*  CALCIUM 9.6   Liver Function Tests: No results for input(s): AST, ALT, ALKPHOS, BILITOT, PROT, ALBUMIN in the last 168 hours. No results for input(s): LIPASE, AMYLASE in the last 168 hours. No results for input(s): AMMONIA in the last 168 hours. CBC: Recent Labs  Lab 06/07/21 1005  WBC 4.5  HGB 14.6  HCT 44.8  MCV 97.4  PLT 156   Coagulation Profile: No results for input(s): INR, PROTIME in the last 168 hours. Cardiac Enzymes: No results for input(s): CKTOTAL, CKMB, CKMBINDEX, TROPONINI in the last 168 hours. BNP: Invalid input(s): POCBNP CBG: No results for input(s): GLUCAP in the last 168 hours. Urine analysis:    Component Value Date/Time   COLORURINE AMBER (A) 07/02/2018 1530   APPEARANCEUR CLEAR  07/02/2018 1530   LABSPEC 1.026 07/02/2018 1530   PHURINE 5.0 07/02/2018 1530   GLUCOSEU NEGATIVE 07/02/2018 1530   HGBUR NEGATIVE 07/02/2018 1530   BILIRUBINUR NEGATIVE 07/02/2018 1530   KETONESUR 5 (A) 07/02/2018 1530   PROTEINUR NEGATIVE 07/02/2018 1530   UROBILINOGEN 0.2 06/26/2009 0953   NITRITE NEGATIVE 07/02/2018 1530   LEUKOCYTESUR NEGATIVE 07/02/2018 1530   Sepsis Labs: @LABRCNTIP (procalcitonin:4,lacticidven:4) )No results found for this or any previous visit (from the past 240 hour(s)).   Radiological Exams on Admission: DG Chest 2 View  Result Date: 06/07/2021 CLINICAL DATA:  Chest pain EXAM: CHEST - 2 VIEW COMPARISON:  Chest x-ray dated January 10, 2021 FINDINGS: Cardiac and mediastinal contours are within normal limits. Lungs are clear. No pleural effusion or pneumothorax. IMPRESSION: No active cardiopulmonary disease. Electronically Signed   By: Yetta Glassman M.D.   On: 06/07/2021 10:36    EKG: Independently reviewed. Sinus brady, no STT changes    Time spent:60 minutes Code Status:   FULL Family Communication:  No Family at bedside Disposition Plan: expect 1 day hospitalization Consults called: cardiology  DVT Prophylaxis: [place] Lovenox  Orson Eva, DO  [facility] Pager [phone]  If 7PM-7AM, please contact night-coverage [email or web address] Password [facility] 06/07/2021, 12:47 PM

## 2021-06-07 NOTE — ED Notes (Signed)
Pt informed he has a bed that is being cleaned.  Pt states good thing I am miserable.  This nurse attempted to reposition bed and pt declined and will wait on inpatient bed

## 2021-06-07 NOTE — ED Provider Notes (Signed)
[facility] EMERGENCY DEPARTMENT Provider Note  CSN: [id number] Arrival date & time: 06/07/21 0945    History Chief Complaint  Patient presents with   Chest Pain    Frank Weber is a 70 y.o. male with history of non-obstructive CAD reports he has had mild intermittent chest pains for about a week, worsened this morning and was radiating into his L arm and neck. He felt a lump in his throat. No SOB, cough or fever. No N/V/D or diaphoresis. He took NTG x 3 without improvement and was instructed by Cardiology office to come to the ED for evaluation. He has a history of GERD but reports this pain is different.    Past Medical History:  Diagnosis Date   Arthritis    right knee   Constipation    Frequency of urination    GERD (gastroesophageal reflux disease)    Heart murmur    "slight"    History of kidney stones    History of melanoma excision    2012--  NECK/ HEAD   History of squamous cell carcinoma excision    RIGHT EAR   Hyperlipidemia    Hypothyroidism    IBS (irritable bowel syndrome)    states slight   Lumbar stenosis    L5 - S1   Migraines    Peripheral neuropathy    legs and feet   Right flank pain    Right ureteral stone    Urgency of urination    Wears glasses     Past Surgical History:  Procedure Laterality Date   BIOPSY  01/10/2021   Procedure: BIOPSY;  Surgeon: [name], MD;  [facility];  Service: Gastroenterology;;   CARDIAC CATHETERIZATION  07-08-2007  dr Tami Ribas    no significant CAD, preserved LVF, ef 50%//  30% pLAD   CARDIOVASCULAR STRESS TEST  06-20-2011  dr croitoru   Low risk study/  normal perfusion scan showing attenuation artifact in the anterior region of myocardium with no ischemia , infarct or scar/  normal LV funciton and wall motion , ef 62%   COLONOSCOPY N/A 02/17/2013   Rehman:examination performed to cecum. mild sigmoid colon diverticulosis, 4 small polyps ablated via cold biopsy from rectosigmoid  junction (hyperplastic). External hemorrhoids.   COLONOSCOPY WITH PROPOFOL N/A 01/10/2021   Procedure: COLONOSCOPY WITH PROPOFOL;  Surgeon: [name], MD;  [facility];  Service: Gastroenterology;  Laterality: N/A;   CYSTOSCOPY W/ URETERAL STENT PLACEMENT Right 06/18/2015   Procedure: CYSTOSCOPY WITH RIGHT RETROGRADE PYELOGRAM RIGHT URETERAL STENT PLACEMENT;  Surgeon: [name], MD;  [facility];  Service: Urology;  Laterality: Right;   CYSTOSCOPY/URETEROSCOPY/HOLMIUM LASER/STENT PLACEMENT Right 06/27/2015   Procedure: CYSTOSCOPY RIGHT URETEROSCOPY  STENT REMOVAL; STONE EXTRACTION WITH BASKET;  Surgeon: [name], MD;  Location: [facility];  Service: Urology;  Laterality: Right;   ESOPHAGOGASTRODUODENOSCOPY (EGD) WITH PROPOFOL N/A 01/10/2021   Procedure: ESOPHAGOGASTRODUODENOSCOPY (EGD) WITH PROPOFOL;  Surgeon: [name], MD;  [facility];  Service: Gastroenterology;  Laterality: N/A;  11:00, moved up per [name] - pt knows arrival time   [place] EXCISIONAL BIOPSY     HOLMIUM LASER APPLICATION Right 34/19/6222   Procedure: HOLMIUM LASER LITHOTRIPSY ;  Surgeon: [name], MD;  Location: [facility];  Service: Urology;  Laterality: Right;   I & D EXTREMITY Right 07/14/2018   Procedure: IRRIGATION AND DEBRIDEMENT RIGHT KNEE WITH CLOSURE;  Surgeon: Victorino December  Saralyn Pilar, MD;  Location: [place];  Service: Orthopedics;  Laterality: Right;   INGUINAL HERNIA REPAIR Left 10/21/2007   KNEE ARTHROSCOPY W/ ACL RECONSTRUCTION Right 1992   MENISCUS REPAIR Right    2019, dr Theda Sers    POLYPECTOMY  01/10/2021   Procedure: POLYPECTOMY;  Surgeon: [name], MD;  [facility];  Service: Gastroenterology;;   SHOULDER ARTHROSCOPY WITH OPEN ROTATOR CUFF REPAIR Left 07/27/2014   Procedure: LEFT SHOULDER ARTHROSCOPY WITH MINI OPEN ROTATOR CUFF REPAIR;  Surgeon: [name], MD;   Location: [place];  Service: Orthopedics;  Laterality: Left;   SHOULDER ARTHROSCOPY WITH OPEN ROTATOR CUFF REPAIR Right 2006   TONSILLECTOMY AND ADENOIDECTOMY  age 69   TOTAL KNEE ARTHROPLASTY Right 07/10/2018   Procedure: TOTAL KNEE ARTHROPLASTY;  Surgeon: [name], MD;  [facility];  Service: Orthopedics;  Laterality: Right;   TRANSTHORACIC ECHOCARDIOGRAM  07/30/2007   normal LVF, ef >55%/  mild AR, MR , PR and TR/  mild AV sclerosis without stenosis/    URETEROSCOPY Right 06/18/2015   Procedure: URETEROSCOPY;  Surgeon: [name], MD;  [facility];  Service: Urology;  Laterality: Right;    Family History  Problem Relation Age of Onset   Colon cancer Other        Age of Onset-late 66's    Social History   Tobacco Use   Smoking status: Former    Packs/day: 0.00    Years: 30.00    Pack years: 0.00    Types: Cigars, Cigarettes   Smokeless tobacco: Never  Vaping Use   Vaping Use: Never used  Substance Use Topics   Alcohol use: Yes    Comment: seldom    Drug use: No     Home Medications Prior to Admission medications   Medication Sig Start Date End Date Taking? Authorizing Provider  ALPRAZOLAM XR 1 MG 24 hr tablet Take 1 mg by mouth at bedtime. 11/17/19  Yes [provider]  aspirin EC 81 MG tablet Take 1 tablet (81 mg total) by mouth daily. Swallow whole. 12/24/19  Yes Josue Hector, MD  atorvastatin (LIPITOR) 20 MG tablet Take 20 mg by mouth daily. 11/09/19  Yes [provider]  calcium elemental as carbonate (TUMS ULTRA 1000) 400 MG chewable tablet Chew 2,000 mg by mouth daily as needed for heartburn.   Yes [provider]  Cholecalciferol (DIALYVITE VITAMIN D 5000) 125 MCG (5000 UT) capsule Take 5,000 Units by mouth daily.   Yes [provider]  gabapentin (NEURONTIN) 300 MG capsule Take 900 mg by mouth 4 (four) times daily.   Yes [provider]  levothyroxine (SYNTHROID, LEVOTHROID) 25 MCG  tablet Take 25 mcg by mouth daily before breakfast.   Yes [provider]  LORazepam (ATIVAN) 0.5 MG tablet Take 0.5-1 mg by mouth daily as needed for anxiety.   Yes [provider]  nitroGLYCERIN (NITROSTAT) 0.4 MG SL tablet Place 1 tablet (0.4 mg total) under the tongue every 5 (five) minutes as needed. 12/24/19  Yes Josue Hector, MD  Polyethyl Glycol-Propyl Glycol (SYSTANE ULTRA) 0.4-0.3 % SOLN Place 2 drops into both eyes as needed (for dry eyes).    Yes [provider]  Probiotic Product (TRUBIOTICS PO) Take 1 capsule by mouth at bedtime.   Yes [provider]  promethazine (PHENERGAN) 12.5 MG tablet Take 1 tablet (12.5 mg total) by mouth every 6 (six) hours as needed for nausea or vomiting. 01/11/21  Yes Bonnielee Haff, MD  rizatriptan (MAXALT-MLT) 10 MG disintegrating tablet Take 10 mg by mouth as needed (for migraines and may repeat once in 2 hours, if no relief).    Yes [provider]  vitamin k 100 MCG tablet Take 100 mcg by mouth daily.   Yes [provider]     Allergies    Sulfa antibiotics and Coconut oil   Review of Systems   Review of Systems A comprehensive review of systems was completed and negative except as noted in HPI.     Physical Exam BP 114/70    Pulse (!) 42    Temp 97.8 F (36.6 C) (Oral)    Resp 14    Ht 6\' 3"  (1.905 m)    Wt 89.8 kg    SpO2 100%    BMI 24.75 kg/m   Physical Exam Vitals and nursing note reviewed.  Constitutional:      Appearance: Normal appearance.  HENT:     Head: Normocephalic and atraumatic.     Nose: Nose normal.     Mouth/Throat:     Mouth: Mucous membranes are moist.  Eyes:     Extraocular Movements: Extraocular movements intact.     Conjunctiva/sclera: Conjunctivae normal.  Cardiovascular:     Rate and Rhythm: Normal rate.  Pulmonary:     Effort: Pulmonary effort is normal.     Breath sounds: Normal breath sounds.  Chest:     Chest wall: No tenderness.  Abdominal:      General: Abdomen is flat.     Palpations: Abdomen is soft.     Tenderness: There is no abdominal tenderness.  Musculoskeletal:        General: No swelling. Normal range of motion.     Cervical back: Neck supple.     Right lower leg: No edema.     Left lower leg: No edema.  Skin:    General: Skin is warm and dry.  Neurological:     General: No focal deficit present.     Mental Status: He is alert.  Psychiatric:        Mood and Affect: Mood normal.     ED Results / Procedures / Treatments   Labs (all labs ordered are listed, but only abnormal results are displayed) Labs Reviewed  BASIC METABOLIC PANEL - Abnormal; Notable for the following components:      Result Value   Creatinine, Ser 1.25 (*)    Anion gap 3 (*)    All other components within normal limits  RESP PANEL BY RT-PCR (FLU A&B, COVID) ARPGX2  CBC  TROPONIN I (HIGH SENSITIVITY)  TROPONIN I (HIGH SENSITIVITY)    EKG EKG Interpretation  Date/Time:  Thursday June 07 2021 09:58:02 EST Ventricular Rate:  51 PR Interval:  166 QRS Duration: 105 QT Interval:  437 QTC Calculation: 403 R Axis:   12 Text Interpretation: Sinus rhythm Low voltage, precordial leads No significant change since last tracing Confirmed by Calvert Cantor [phone]) on 06/07/2021 10:16:29 AM  Radiology DG Chest 2 View  Result Date: 06/07/2021 CLINICAL DATA:  Chest pain EXAM: CHEST - 2 VIEW COMPARISON:  Chest x-ray dated January 10, 2021 FINDINGS: Cardiac and mediastinal contours are within normal limits. Lungs are clear. No pleural effusion or pneumothorax. IMPRESSION: No active cardiopulmonary disease. Electronically Signed   By: Yetta Glassman M.D.   On: 06/07/2021 10:36    Procedures Procedures  Medications Ordered in the ED Medications  regadenoson (LEXISCAN) injection SOLN 0.4  mg (has no administration in time range)  aspirin chewable tablet 324 mg (324 mg Oral Given 06/07/21 1136)  ketorolac (TORADOL) 30 MG/ML injection 30  mg (30 mg Intravenous Given 06/07/21 1136)     MDM Rules/Calculators/A&P MDM Patient with known non-obstructive CAD (last had Coronary CT in Jul 2021) here with chest pains, not relieved with NTG. EKG without ischemic changes, CXR is clear. Will check labs and reassess. Cardiac monitor with sinus brady, this is baseline for him.   ED Course  I have reviewed the triage vital signs and the nursing notes.  Pertinent labs & imaging results that were available during my care of the patient were reviewed by me and considered in my medical decision making (see chart for details).  Clinical Course as of 06/07/21 1340  Thu Jun 07, 2021  1025 CBC is normal.  [CS]  1056 First Trop is neg. BMP had to be redrawn.  [CS]  1129 BMP is unremarkable. Cr is at baseline. Patient reports pain is mild right now. Will give full dose ASA as well as Toradol for the pain. Plan discuss with Cardiology.  [CS]  3086 Spoke with Dr. [name], Cardiology, who will come evaluate the patient at bedside to determine next steps.  [CS]  5784 Patient seen by Dr. [name], who requests the patient be admitted to Hospitalist to cycle enzymes and they will decide on cath vs other testing tomorrow.  [CS]  1243 Spoke with Dr. Carles Collet, Hospitalist, who will evaluate for admission.  [CS]    Clinical Course User Index [CS] Truddie Hidden, MD    Final Clinical Impression(s) / ED Diagnoses Final diagnoses:  Chest pain, unspecified type    Rx / DC Orders ED Discharge Orders     None        Truddie Hidden, MD 06/07/21 1340

## 2021-06-07 NOTE — ED Triage Notes (Signed)
Complaints of chest pain for a week that got worse last night. Pt took 3 nitro this morning without relief.

## 2021-06-07 NOTE — ED Notes (Signed)
Pt given lunch tray.

## 2021-06-07 NOTE — Consult Note (Signed)
CARDIOLOGY CONSULT NOTE       Patient ID: Frank Weber MRN: [id number] DOB/AGE: [birth year] 70 y.o.  Admit date: 06/07/2021 Referring Physician: Karle Starch [facility] Primary Physician: [name], MD Primary Cardiologist: [name] Reason for Consultation: Chest Pain  Active Problems:   * No active hospital problems. *   HPI:  70 y.o. 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 FFR CT negative but 0.81 Had flu/URI after thanksgiving with cough dyspnea and myalgias. Rx by primary with antibiotics. For last week has had sharp SSCP radiates to jaw. Feels like he has pain in his strap muscles and lymphadenopathy. Pain has been fairly constant Not exertional Worse this am and took 3 nitro with no real change. Denies pleurisy. CXR with no active disease Had CTA with negative PE in August. ECG is non acute and troponin negative x1   ROS All other systems reviewed and negative except as noted above  Past Medical History:  Diagnosis Date   Arthritis    right knee   Constipation    Frequency of urination    GERD (gastroesophageal reflux disease)    Heart murmur    "slight"    History of kidney stones    History of melanoma excision    2012--  NECK/ HEAD   History of squamous cell carcinoma excision    RIGHT EAR   Hyperlipidemia    Hypothyroidism    IBS (irritable bowel syndrome)    states slight   Lumbar stenosis    L5 - S1   Migraines    Peripheral neuropathy    legs and feet   Right flank pain    Right ureteral stone    Urgency of urination    Wears glasses     Family History  Problem Relation Age of Onset   Colon cancer Other        Age of Onset-late 10's    Social History   Socioeconomic History   Marital status: Married    Spouse name: Not on file   Number of children: Not on file   Years of education: Not on file   Highest education level: Not on file  Occupational History    Not on file  Tobacco Use   Smoking status: Former    Packs/day: 0.00    Years: 30.00    Pack years: 0.00    Types: Cigars, Cigarettes   Smokeless tobacco: Never  Vaping Use   Vaping Use: Never used  Substance and Sexual Activity   Alcohol use: Yes    Comment: seldom    Drug use: No   Sexual activity: Not on file  Other Topics Concern   Not on file  Social History Narrative   Not on file   Social Determinants of Health   Financial Resource Strain: Not on file  Food Insecurity: Not on file  Transportation Needs: Not on file  Physical Activity: Not on file  Stress: Not on file  Social Connections: Not on file  Intimate Partner Violence: Not on file    Past Surgical History:  Procedure Laterality Date   BIOPSY  01/10/2021   Procedure: BIOPSY;  Surgeon: [name], MD;  Location: [facility];  Service: Gastroenterology;;   CARDIAC CATHETERIZATION  07-08-2007  dr Tami Ribas    no significant CAD, preserved LVF, ef 50%//  30% pLAD   CARDIOVASCULAR STRESS TEST  06-20-2011  dr croitoru   Low risk study/  normal perfusion scan showing attenuation artifact in the anterior region of myocardium with no ischemia , infarct or scar/  normal LV funciton and wall motion , ef 62%   COLONOSCOPY N/A 02/17/2013   Rehman:examination performed to cecum. mild sigmoid colon diverticulosis, 4 small polyps ablated via cold biopsy from rectosigmoid junction (hyperplastic). External hemorrhoids.   COLONOSCOPY WITH PROPOFOL N/A 01/10/2021   Procedure: COLONOSCOPY WITH PROPOFOL;  Surgeon: [name], MD;  Location: [facility];  Service: Gastroenterology;  Laterality: N/A;   CYSTOSCOPY W/ URETERAL STENT PLACEMENT Right 06/18/2015   Procedure: CYSTOSCOPY WITH RIGHT RETROGRADE PYELOGRAM RIGHT URETERAL STENT PLACEMENT;  Surgeon: [name], MD;  Location: [facility];  Service: Urology;  Laterality: Right;   CYSTOSCOPY/URETEROSCOPY/HOLMIUM LASER/STENT PLACEMENT Right 06/27/2015    Procedure: CYSTOSCOPY RIGHT URETEROSCOPY  STENT REMOVAL; STONE EXTRACTION WITH BASKET;  Surgeon: [name], MD;  Location: [facility];  Service: Urology;  Laterality: Right;   ESOPHAGOGASTRODUODENOSCOPY (EGD) WITH PROPOFOL N/A 01/10/2021   Procedure: ESOPHAGOGASTRODUODENOSCOPY (EGD) WITH PROPOFOL;  Surgeon: [name], MD;  Location: [facility];  Service: Gastroenterology;  Laterality: N/A;  11:00, moved up per [name] - pt knows arrival time   [place] EXCISIONAL BIOPSY     HOLMIUM LASER APPLICATION Right 09/81/1914   Procedure: HOLMIUM LASER LITHOTRIPSY ;  Surgeon: [name], MD;  Location: [facility];  Service: Urology;  Laterality: Right;   I & D EXTREMITY Right 07/14/2018   Procedure: IRRIGATION AND DEBRIDEMENT RIGHT KNEE WITH CLOSURE;  Surgeon: Nicholes Stairs, MD;  Location: [place];  Service: Orthopedics;  Laterality: Right;   INGUINAL HERNIA REPAIR Left 10/21/2007   KNEE ARTHROSCOPY W/ ACL RECONSTRUCTION Right 1992   MENISCUS REPAIR Right    2019, dr Theda Sers    POLYPECTOMY  01/10/2021   Procedure: POLYPECTOMY;  Surgeon: [name], MD;  Location: [facility];  Service: Gastroenterology;;   SHOULDER ARTHROSCOPY WITH OPEN ROTATOR CUFF REPAIR Left 07/27/2014   Procedure: LEFT SHOULDER ARTHROSCOPY WITH MINI OPEN ROTATOR CUFF REPAIR;  Surgeon: [name], MD;  Location: [place];  Service: Orthopedics;  Laterality: Left;   SHOULDER ARTHROSCOPY WITH OPEN ROTATOR CUFF REPAIR Right 2006   TONSILLECTOMY AND ADENOIDECTOMY  age 70   TOTAL KNEE ARTHROPLASTY Right 07/10/2018   Procedure: TOTAL KNEE ARTHROPLASTY;  Surgeon: [name], MD;  Location: [facility];  Service: Orthopedics;  Laterality: Right;   TRANSTHORACIC ECHOCARDIOGRAM  07/30/2007   normal LVF, ef >55%/  mild AR, MR , PR and TR/  mild AV sclerosis without stenosis/    URETEROSCOPY Right 06/18/2015   Procedure: URETEROSCOPY;   Surgeon: [name], MD;  Location: [facility];  Service: Urology;  Laterality: Right;     No current facility-administered medications for this encounter.  Current Outpatient Medications:    ALPRAZOLAM XR 1 MG 24 hr tablet, Take 1 mg by mouth at bedtime., Disp: , Rfl:    aspirin EC 81 MG tablet, Take 1 tablet (81 mg total) by mouth daily. Swallow whole., Disp: 90 tablet, Rfl: 3   atorvastatin (LIPITOR) 20 MG tablet, Take 20 mg by mouth daily., Disp: , Rfl:    benzocaine (ANBESOL) 10 % mucosal gel, Use as directed 1 application in the mouth or throat as needed for mouth pain., Disp: , Rfl:    calcium elemental as carbonate (TUMS ULTRA 1000) 400 MG chewable tablet, Chew 2,000 mg by  mouth daily as needed for heartburn., Disp: , Rfl:    Cholecalciferol (DIALYVITE VITAMIN D 5000) 125 MCG (5000 UT) capsule, Take 5,000 Units by mouth daily., Disp: , Rfl:    gabapentin (NEURONTIN) 300 MG capsule, Take 900 mg by mouth 4 (four) times daily., Disp: , Rfl:    levothyroxine (SYNTHROID, LEVOTHROID) 25 MCG tablet, Take 25 mcg by mouth daily before breakfast., Disp: , Rfl:    Lidocaine HCl 4 % CREA, Apply 1 application topically daily as needed (pain)., Disp: , Rfl:    LORazepam (ATIVAN) 0.5 MG tablet, Take 0.5 mg by mouth 3 (three) times daily as needed for anxiety., Disp: , Rfl:    magnesium 30 MG tablet, Take 30 mg by mouth daily., Disp: , Rfl:    nitroGLYCERIN (NITROSTAT) 0.4 MG SL tablet, Place 1 tablet (0.4 mg total) under the tongue every 5 (five) minutes as needed., Disp: 25 tablet, Rfl: 3   Polyethyl Glycol-Propyl Glycol (SYSTANE ULTRA) 0.4-0.3 % SOLN, Place 2 drops into both eyes as needed (for dry eyes). , Disp: , Rfl:    polyethylene glycol (MIRALAX / GLYCOLAX) 17 g packet, Take 17 g by mouth 2 (two) times daily., Disp: , Rfl:    Probiotic Product (TRUBIOTICS PO), Take 1 capsule by mouth at bedtime., Disp: , Rfl:    promethazine (PHENERGAN) 12.5 MG tablet, Take 1 tablet (12.5 mg total) by mouth every 6  (six) hours as needed for nausea or vomiting., Disp: 20 tablet, Rfl: 0   rizatriptan (MAXALT-MLT) 10 MG disintegrating tablet, Take 10 mg by mouth as needed (for migraines and may repeat once in 2 hours, if no relief). , Disp: , Rfl:    vitamin k 100 MCG tablet, Take 100 mcg by mouth daily., Disp: , Rfl:     Physical Exam: Blood pressure 108/70, pulse (!) 48, temperature 97.8 F (36.6 C), temperature source Oral, resp. rate (!) 22, height 6\' 3"  (1.905 m), weight 89.8 kg, SpO2 100 %.    Affect appropriate Healthy:  appears stated age 65: normal Neck supple with no adenopathy JVP normal no bruits no thyromegaly Lungs clear with no wheezing and good diaphragmatic motion Heart:  S1/S2 no murmur, no rub, gallop or click PMI normal Abdomen: benighn, BS positve, no tenderness, no AAA no bruit.  No HSM or HJR Distal pulses intact with no bruits No edema Neuro non-focal Skin warm and dry No muscular weakness Post right TKR   Labs:   Lab Results  Component Value Date   WBC 4.5 06/07/2021   HGB 14.6 06/07/2021   HCT 44.8 06/07/2021   MCV 97.4 06/07/2021   PLT 156 06/07/2021    Recent Labs  Lab 06/07/21 1053  NA 138  K 4.7  CL 106  CO2 29  BUN 14  CREATININE 1.25*  CALCIUM 9.6  GLUCOSE 98   Lab Results  Component Value Date   TROPONINI <0.03 08/01/2014    Lab Results  Component Value Date   CHOL 112 01/11/2021   Lab Results  Component Value Date   HDL 48 01/11/2021   Lab Results  Component Value Date   LDLCALC 60 01/11/2021   Lab Results  Component Value Date   TRIG 22 01/11/2021   Lab Results  Component Value Date   CHOLHDL 2.3 01/11/2021   No results found for: LDLDIRECT    Radiology: DG Chest 2 View  Result Date: 06/07/2021 CLINICAL DATA:  Chest pain EXAM: CHEST - 2 VIEW COMPARISON:  Chest x-ray dated  January 10, 2021 FINDINGS: Cardiac and mediastinal contours are within normal limits. Lungs are clear. No pleural effusion or pneumothorax.  IMPRESSION: No active cardiopulmonary disease. Electronically Signed   By: Yetta Glassman M.D.   On: 06/07/2021 10:36    EKG: SB rate 51 normal    ASSESSMENT AND PLAN:   Chest Pain: known moderate LAD dx by cardiac CT/FFR July 2021. Pain is atypical non exertional , sharp, no change with nitro Despite pain for a week troponin negative and no acute ECG changes. CXR NAD. Admit - lexiscan myouve in am if no changes. He has bad right TKR and cannot walk on treadmill Echo to assess EF / RWMA;s HLD:  continue statin  Bradycardia:  chronic no beta blockers no AV block  Thyroid:  on synthroid replacement TSH normal August  SignedJenkins Rouge 06/07/2021, 11:58 AM

## 2021-06-07 NOTE — ED Notes (Signed)
Pt ate 100% of food.

## 2021-06-08 ENCOUNTER — Observation Stay (HOSPITAL_BASED_OUTPATIENT_CLINIC_OR_DEPARTMENT_OTHER): Payer: Medicare Other

## 2021-06-08 ENCOUNTER — Encounter (HOSPITAL_COMMUNITY): Payer: Self-pay | Admitting: Internal Medicine

## 2021-06-08 DIAGNOSIS — R079 Chest pain, unspecified: Secondary | ICD-10-CM

## 2021-06-08 DIAGNOSIS — R072 Precordial pain: Secondary | ICD-10-CM | POA: Diagnosis not present

## 2021-06-08 DIAGNOSIS — E782 Mixed hyperlipidemia: Secondary | ICD-10-CM | POA: Diagnosis not present

## 2021-06-08 DIAGNOSIS — R001 Bradycardia, unspecified: Secondary | ICD-10-CM | POA: Diagnosis not present

## 2021-06-08 LAB — NM MYOCAR MULTI W/SPECT W/WALL MOTION / EF
Base ST Depression (mm): 0 mm
LV dias vol: 127 mL (ref 62–150)
LV sys vol: 59 mL
Nuc Stress EF: 54 %
Peak HR: 67 {beats}/min
RATE: 0.3
Rest HR: 44 {beats}/min
Rest Nuclear Isotope Dose: 10.9 mCi
SDS: 1
SRS: 3
SSS: 4
ST Depression (mm): 0 mm
Stress Nuclear Isotope Dose: 33 mCi
TID: 1.21

## 2021-06-08 LAB — LIPID PANEL
Cholesterol: 146 mg/dL (ref 0–200)
HDL: 56 mg/dL (ref >40)
LDL Cholesterol: 75 mg/dL (ref 0–99)
Total CHOL/HDL Ratio: 2.6 RATIO
Triglycerides: 76 mg/dL (ref <150)
VLDL: 15 mg/dL (ref 0–40)

## 2021-06-08 LAB — ECHOCARDIOGRAM COMPLETE
AR max vel: 3.44 cm2
AV Area VTI: 3.26 cm2
AV Area mean vel: 3.34 cm2
AV Mean grad: 3 mmHg
AV Peak grad: 7.6 mmHg
Ao pk vel: 1.38 m/s
Area-P 1/2: 2.69 cm2
Calc EF: 64.2 %
Height: 75 in
MV VTI: 2.92 cm2
S' Lateral: 3.1 cm
Single Plane A2C EF: 65.8 %
Single Plane A4C EF: 61.3 %
Weight: 3259.2 oz

## 2021-06-08 MED ORDER — TECHNETIUM TC 99M TETROFOSMIN IV KIT
30.0000 | PACK | Freq: Once | INTRAVENOUS | Status: AC | PRN
  Administered 2021-06-08: 08:00:00 33 via INTRAVENOUS

## 2021-06-08 MED ORDER — REGADENOSON 0.4 MG/5ML IV SOLN
INTRAVENOUS | Status: AC
  Administered 2021-06-08: 08:00:00 0.4 mg via INTRAVENOUS
  Filled 2021-06-08: qty 5

## 2021-06-08 MED ORDER — SODIUM CHLORIDE FLUSH 0.9 % IV SOLN
INTRAVENOUS | Status: AC
  Administered 2021-06-08: 08:00:00 10 mL via INTRAVENOUS
  Filled 2021-06-08: qty 10

## 2021-06-08 MED ORDER — TECHNETIUM TC 99M TETROFOSMIN IV KIT
10.0000 | PACK | Freq: Once | INTRAVENOUS | Status: AC | PRN
  Administered 2021-06-08: 07:00:00 10.9 via INTRAVENOUS

## 2021-06-08 MED ORDER — CYCLOBENZAPRINE HCL 5 MG PO TABS: 5.0000 mg | ORAL_TABLET | Freq: Three times a day (TID) | ORAL | 0 refills | Status: DC | PRN

## 2021-06-08 NOTE — Progress Notes (Signed)
*  PRELIMINARY RESULTS* Echocardiogram 2D Echocardiogram has been performed.  Frank Weber 06/08/2021, 9:42 AM

## 2021-06-08 NOTE — Discharge Summary (Signed)
Physician Discharge Summary  Frank Weber CBJ:[id number] DOB: 1950-11-18 DOA: 06/07/2021  PCP: [name], MD  Admit date: 06/07/2021 Discharge date: 06/08/2021  Admitted From: Home Disposition:  Home   Recommendations for Outpatient Follow-up:  Follow up with PCP in 1-2 weeks Please obtain BMP/CBC in one week     Discharge Condition: Stable CODE STATUS: FULL Diet recommendation: Heart Healthy    Brief/Interim Summary: 70 y.o. male with medical history of coronary artery disease and LAD by cardiac CTA done on 12/23/2019, anxiety, hyperlipidemia, hypothyroidism, peripheral neuropathy presenting with substernal chest pain for the past week.  Patient states that he had URI type symptoms around Thanksgiving time with a cough and chest congestion.  He has some chest discomfort at that time, but he states that for the past week this type of chest discomfort is different.  He describes it as constant substernal dull chest pain with occasional sharp pain.  It occurs at rest,, and it is not necessarily worsened by exertion.  He states that certain movements will make it worse as well as coughing.  He denies any shortness of breath, fevers, chills, vomiting.  He has had some nausea but denies any dizziness or syncope.  He denies any coughing hemoptysis.  The patient states that he quit smoking cigarettes about 2 months ago.  He previously smoked cigarettes with approximately 25-pack-year history.  In the morning of 06/07/2021, the patient took 3 nitroglycerin without relief.  As result, the patient came to the emergency department for further evaluation.  The patient does complain of some loose stools, but he states that he has been constipated and was taking MiraLAX and Dulcolax 2 days prior to this admission. ED Patient was afebrile hemodynamically stable with oxygen saturation 100% room air.  BMP showed sodium 130, potassium 4.7, bicarbonate 29, serum creatinine 1.25.  WBC 4.5, hemoglobin  14.6, platelets 1 56,000.  Troponin is 3.  EKG shows sinus bradycardia with no ST-T wave changes.  Chest x-ray was negative. Cardiology was consulted.  Patient underwent lexiscan on 12/30 which was low risk.  He was cleared by cardiology to go home  Discharge Diagnoses:  Chest pain -Finish cycling troponins--unremarkable -Appreciate cardiology consult -Planning for Lexiscan in the a.m. if persistent chest pain -Echo EF 60-65%, no WMA; mild dilatation of asc aorta -12/30 lexiscan--normal/low risk -clinically may be musculoskeletal>>d/c home with trial of flexeril   Hyperlipidemia -Continue statin   Sinus bradycardia -Avoid AV nodal blockade -Avoid beta-blockers -no AV block on EKG   Hypothyroidism -Continue Synthroid   Anxiety -Continue alprazolam -PDMP reviewed--patient receives alprazolam XR 1 mg #30 on a monthly basis   Peripheral neuropathy -Continue gabapentin     Discharge Instructions   Allergies as of 06/08/2021       Reactions   Sulfa Antibiotics Anaphylaxis, Shortness Of Breath, Rash   Coconut Oil Rash        Medication List     TAKE these medications    ALPRAZolam XR 1 MG 24 hr tablet Generic drug: ALPRAZolam Take 1 mg by mouth at bedtime.   aspirin EC 81 MG tablet Take 1 tablet (81 mg total) by mouth daily. Swallow whole.   atorvastatin 20 MG tablet Commonly known as: LIPITOR Take 20 mg by mouth daily.   cyclobenzaprine 5 MG tablet Commonly known as: FLEXERIL Take 1 tablet (5 mg total) by mouth 3 (three) times daily as needed for muscle spasms.   Dialyvite Vitamin D 5000 125 MCG (5000 UT) capsule Generic drug: Cholecalciferol  Take 5,000 Units by mouth daily.   gabapentin 300 MG capsule Commonly known as: NEURONTIN Take 900 mg by mouth 4 (four) times daily.   levothyroxine 25 MCG tablet Commonly known as: SYNTHROID Take 25 mcg by mouth daily before breakfast.   LORazepam 0.5 MG tablet Commonly known as: ATIVAN Take 0.5-1 mg by  mouth daily as needed for anxiety.   nitroGLYCERIN 0.4 MG SL tablet Commonly known as: NITROSTAT Place 1 tablet (0.4 mg total) under the tongue every 5 (five) minutes as needed.   promethazine 12.5 MG tablet Commonly known as: PHENERGAN Take 1 tablet (12.5 mg total) by mouth every 6 (six) hours as needed for nausea or vomiting.   rizatriptan 10 MG disintegrating tablet Commonly known as: MAXALT-MLT Take 10 mg by mouth as needed (for migraines and may repeat once in 2 hours, if no relief).   Systane Ultra 0.4-0.3 % Soln Generic drug: Polyethyl Glycol-Propyl Glycol Place 2 drops into both eyes as needed (for dry eyes).   TRUBIOTICS PO Take 1 capsule by mouth at bedtime.   Tums Ultra 1000 400 MG chewable tablet Generic drug: calcium elemental as carbonate Chew 2,000 mg by mouth daily as needed for heartburn.   vitamin k 100 MCG tablet Take 100 mcg by mouth daily.        Follow-up Information     [facility] Follow up on 07/18/2021.   Specialty: Cardiology Why: [facility] Follow-up on 07/18/2021 at 1:15 with Dr. Nolen Mu information: [place] [id number] [phone]               Allergies  Allergen Reactions   Sulfa Antibiotics Anaphylaxis, Shortness Of Breath and Rash   Coconut Oil Rash    Consultations: cardiology   Procedures/Studies: DG Chest 2 View  Result Date: 06/07/2021 CLINICAL DATA:  Chest pain EXAM: CHEST - 2 VIEW COMPARISON:  Chest x-ray dated January 10, 2021 FINDINGS: Cardiac and mediastinal contours are within normal limits. Lungs are clear. No pleural effusion or pneumothorax. IMPRESSION: No active cardiopulmonary disease. Electronically Signed   By: Yetta Glassman M.D.   On: 06/07/2021 10:36   NM Myocar Multi W/Spect W/Wall Motion / EF  Result Date: 06/08/2021   The study is normal. The study is low risk.   No ST deviation was noted.   LV perfusion is normal. There is no evidence of  ischemia. There is no evidence of infarction.   Left ventricular function is normal. End diastolic cavity size is normal. End systolic cavity size is normal. Diaphragmatic attenuation and motion artifact No ischemia/infarction EF 54%   ECHOCARDIOGRAM COMPLETE  Result Date: 06/08/2021    ECHOCARDIOGRAM REPORT   Patient Name:   Frank Weber Date of Exam: 06/08/2021 Medical Rec #:  [id number]      Height:       75.0 in Accession #:    [id number]     Weight:       203.7 lb Date of Birth:  1951-05-19      BSA:          2.212 m Patient Age:    40 years       BP:           104/70 mmHg Patient Gender: M              HR:           47 bpm. Exam Location:  [facility] Procedure: 2D Echo, Cardiac Doppler and  Color Doppler Indications:    Chest Pain  History:        Patient has prior history of Echocardiogram examinations, most                 recent 01/11/2021. Signs/Symptoms:Chest Pain; Risk                 Factors:Dyslipidemia.  Sonographer:    Wenda Low Referring Phys: Foristell Comments: Image acquisition challenging due to respiratory motion. IMPRESSIONS  1. Left ventricular ejection fraction, by estimation, is 60 to 65%. The left ventricle has normal function. The left ventricle has no regional wall motion abnormalities. Left ventricular diastolic parameters were normal.  2. Right ventricular systolic function is normal. The right ventricular size is normal. There is normal pulmonary artery systolic pressure.  3. Left atrial size was mildly dilated.  4. The mitral valve is normal in structure. No evidence of mitral valve regurgitation. No evidence of mitral stenosis.  5. The aortic valve is tricuspid. Aortic valve regurgitation is not visualized. No aortic stenosis is present.  6. Aortic dilatation noted. There is mild dilatation of the ascending aorta, measuring 40 mm.  7. The inferior vena cava is normal in size with greater than 50% respiratory variability, suggesting right atrial  pressure of 3 mmHg. FINDINGS  Left Ventricle: Left ventricular ejection fraction, by estimation, is 60 to 65%. The left ventricle has normal function. The left ventricle has no regional wall motion abnormalities. The left ventricular internal cavity size was normal in size. There is  no left ventricular hypertrophy. Left ventricular diastolic parameters were normal. Right Ventricle: The right ventricular size is normal. No increase in right ventricular wall thickness. Right ventricular systolic function is normal. There is normal pulmonary artery systolic pressure. The tricuspid regurgitant velocity is 2.44 m/s, and  with an assumed right atrial pressure of 3 mmHg, the estimated right ventricular systolic pressure is 09.9 mmHg. Left Atrium: Left atrial size was mildly dilated. Right Atrium: Right atrial size was normal in size. Pericardium: There is no evidence of pericardial effusion. Mitral Valve: The mitral valve is normal in structure. No evidence of mitral valve regurgitation. No evidence of mitral valve stenosis. MV peak gradient, 2.9 mmHg. The mean mitral valve gradient is 1.0 mmHg. Tricuspid Valve: The tricuspid valve is normal in structure. Tricuspid valve regurgitation is trivial. No evidence of tricuspid stenosis. Aortic Valve: The aortic valve is tricuspid. Aortic valve regurgitation is not visualized. No aortic stenosis is present. Aortic valve mean gradient measures 3.0 mmHg. Aortic valve peak gradient measures 7.6 mmHg. Aortic valve area, by VTI measures 3.26 cm. Pulmonic Valve: The pulmonic valve was normal in structure. Pulmonic valve regurgitation is not visualized. No evidence of pulmonic stenosis. Aorta: The aortic root is normal in size and structure and aortic dilatation noted. There is mild dilatation of the ascending aorta, measuring 40 mm. Venous: The inferior vena cava is normal in size with greater than 50% respiratory variability, suggesting right atrial pressure of 3 mmHg. IAS/Shunts:  No atrial level shunt detected by color flow Doppler.  LEFT VENTRICLE PLAX 2D LVIDd:         4.20 cm     Diastology LVIDs:         3.10 cm     LV e' medial:    8.35 cm/s LV PW:         1.00 cm     LV E/e' medial:  10.1 LV IVS:  1.00 cm     LV e' lateral:   13.20 cm/s LVOT diam:     2.20 cm     LV E/e' lateral: 6.4 LV SV:         121 LV SV Index:   54 LVOT Area:     3.80 cm  LV Volumes (MOD) LV vol d, MOD A2C: 77.8 ml LV vol d, MOD A4C: 77.2 ml LV vol s, MOD A2C: 26.6 ml LV vol s, MOD A4C: 29.9 ml LV SV MOD A2C:     51.2 ml LV SV MOD A4C:     77.2 ml LV SV MOD BP:      51.9 ml RIGHT VENTRICLE RV Basal diam:  3.50 cm RV Mid diam:    3.10 cm RV S prime:     13.60 cm/s TAPSE (M-mode): 3.3 cm LEFT ATRIUM             Index        RIGHT ATRIUM           Index LA diam:        3.80 cm 1.72 cm/m   RA Area:     21.80 cm LA Vol (A2C):   64.6 ml 29.21 ml/m  RA Volume:   65.10 ml  29.44 ml/m LA Vol (A4C):   56.3 ml 25.46 ml/m LA Biplane Vol: 59.8 ml 27.04 ml/m  AORTIC VALVE                    PULMONIC VALVE AV Area (Vmax):    3.44 cm     PV Vmax:       0.72 m/s AV Area (Vmean):   3.34 cm     PV Peak grad:  2.1 mmHg AV Area (VTI):     3.26 cm AV Vmax:           138.00 cm/s AV Vmean:          81.700 cm/s AV VTI:            0.370 m AV Peak Grad:      7.6 mmHg AV Mean Grad:      3.0 mmHg LVOT Vmax:         125.00 cm/s LVOT Vmean:        71.700 cm/s LVOT VTI:          0.317 m LVOT/AV VTI ratio: 0.86  AORTA Ao Root diam: 2.70 cm Ao Asc diam:  4.00 cm MITRAL VALVE               TRICUSPID VALVE MV Area (PHT): 2.69 cm    TR Peak grad:   23.8 mmHg MV Area VTI:   2.92 cm    TR Vmax:        244.00 cm/s MV Peak grad:  2.9 mmHg MV Mean grad:  1.0 mmHg    SHUNTS MV Vmax:       0.85 m/s    Systemic VTI:  0.32 m MV Vmean:      49.0 cm/s   Systemic Diam: 2.20 cm MV Decel Time: 282 msec MV E velocity: 84.40 cm/s MV A velocity: 84.00 cm/s MV E/A ratio:  1.00 Jenkins Rouge MD Electronically signed by Jenkins Rouge MD Signature  Date/Time: 06/08/2021/9:59:42 AM    Final         Discharge Exam: Vitals:   06/08/21 0025 06/08/21 0432  BP: 102/67 108/64  Pulse: (!) 43 (!) 45  Resp:  18  Temp:  98.9 F (37.2 C) 97.6 F (36.4 C)  SpO2: 98% 96%   Vitals:   06/07/21 1648 06/07/21 2042 06/08/21 0025 06/08/21 0432  BP: 104/71 107/76 102/67 108/64  Pulse: (!) 51 (!) 47 (!) 43 (!) 45  Resp: 16   18  Temp: 98.2 F (36.8 C) 97.8 F (36.6 C) 98.9 F (37.2 C) 97.6 F (36.4 C)  TempSrc: Oral Oral  Oral  SpO2: 100% 99% 98% 96%  Weight: 92.4 kg     Height: 6\' 3"  (1.905 m)       General: Pt is alert, awake, not in acute distress Cardiovascular: RRR, S1/S2 +, no rubs, no gallops Respiratory: CTA bilaterally, no wheezing, no rhonchi Abdominal: Soft, NT, ND, bowel sounds + Extremities: no edema, no cyanosis   The results of significant diagnostics from this hospitalization (including imaging, microbiology, ancillary and laboratory) are listed below for reference.    Significant Diagnostic Studies: DG Chest 2 View  Result Date: 06/07/2021 CLINICAL DATA:  Chest pain EXAM: CHEST - 2 VIEW COMPARISON:  Chest x-ray dated January 10, 2021 FINDINGS: Cardiac and mediastinal contours are within normal limits. Lungs are clear. No pleural effusion or pneumothorax. IMPRESSION: No active cardiopulmonary disease. Electronically Signed   By: Yetta Glassman M.D.   On: 06/07/2021 10:36   NM Myocar Multi W/Spect W/Wall Motion / EF  Result Date: 06/08/2021   The study is normal. The study is low risk.   No ST deviation was noted.   LV perfusion is normal. There is no evidence of ischemia. There is no evidence of infarction.   Left ventricular function is normal. End diastolic cavity size is normal. End systolic cavity size is normal. Diaphragmatic attenuation and motion artifact No ischemia/infarction EF 54%   ECHOCARDIOGRAM COMPLETE  Result Date: 06/08/2021    ECHOCARDIOGRAM REPORT   Patient Name:   Frank Weber Date of Exam:  06/08/2021 Medical Rec #:  [id number]      Height:       75.0 in Accession #:    [id number]     Weight:       203.7 lb Date of Birth:  05/26/1951      BSA:          2.212 m Patient Age:    67 years       BP:           104/70 mmHg Patient Gender: M              HR:           47 bpm. Exam Location:  [facility] Procedure: 2D Echo, Cardiac Doppler and Color Doppler Indications:    Chest Pain  History:        Patient has prior history of Echocardiogram examinations, most                 recent 01/11/2021. Signs/Symptoms:Chest Pain; Risk                 Factors:Dyslipidemia.  Sonographer:    Wenda Low Referring Phys: Alexander Comments: Image acquisition challenging due to respiratory motion. IMPRESSIONS  1. Left ventricular ejection fraction, by estimation, is 60 to 65%. The left ventricle has normal function. The left ventricle has no regional wall motion abnormalities. Left ventricular diastolic parameters were normal.  2. Right ventricular systolic function is normal. The right ventricular size is normal. There is normal pulmonary artery systolic pressure.  3. Left atrial size was mildly  dilated.  4. The mitral valve is normal in structure. No evidence of mitral valve regurgitation. No evidence of mitral stenosis.  5. The aortic valve is tricuspid. Aortic valve regurgitation is not visualized. No aortic stenosis is present.  6. Aortic dilatation noted. There is mild dilatation of the ascending aorta, measuring 40 mm.  7. The inferior vena cava is normal in size with greater than 50% respiratory variability, suggesting right atrial pressure of 3 mmHg. FINDINGS  Left Ventricle: Left ventricular ejection fraction, by estimation, is 60 to 65%. The left ventricle has normal function. The left ventricle has no regional wall motion abnormalities. The left ventricular internal cavity size was normal in size. There is  no left ventricular hypertrophy. Left ventricular diastolic parameters were  normal. Right Ventricle: The right ventricular size is normal. No increase in right ventricular wall thickness. Right ventricular systolic function is normal. There is normal pulmonary artery systolic pressure. The tricuspid regurgitant velocity is 2.44 m/s, and  with an assumed right atrial pressure of 3 mmHg, the estimated right ventricular systolic pressure is 33.8 mmHg. Left Atrium: Left atrial size was mildly dilated. Right Atrium: Right atrial size was normal in size. Pericardium: There is no evidence of pericardial effusion. Mitral Valve: The mitral valve is normal in structure. No evidence of mitral valve regurgitation. No evidence of mitral valve stenosis. MV peak gradient, 2.9 mmHg. The mean mitral valve gradient is 1.0 mmHg. Tricuspid Valve: The tricuspid valve is normal in structure. Tricuspid valve regurgitation is trivial. No evidence of tricuspid stenosis. Aortic Valve: The aortic valve is tricuspid. Aortic valve regurgitation is not visualized. No aortic stenosis is present. Aortic valve mean gradient measures 3.0 mmHg. Aortic valve peak gradient measures 7.6 mmHg. Aortic valve area, by VTI measures 3.26 cm. Pulmonic Valve: The pulmonic valve was normal in structure. Pulmonic valve regurgitation is not visualized. No evidence of pulmonic stenosis. Aorta: The aortic root is normal in size and structure and aortic dilatation noted. There is mild dilatation of the ascending aorta, measuring 40 mm. Venous: The inferior vena cava is normal in size with greater than 50% respiratory variability, suggesting right atrial pressure of 3 mmHg. IAS/Shunts: No atrial level shunt detected by color flow Doppler.  LEFT VENTRICLE PLAX 2D LVIDd:         4.20 cm     Diastology LVIDs:         3.10 cm     LV e' medial:    8.35 cm/s LV PW:         1.00 cm     LV E/e' medial:  10.1 LV IVS:        1.00 cm     LV e' lateral:   13.20 cm/s LVOT diam:     2.20 cm     LV E/e' lateral: 6.4 LV SV:         121 LV SV Index:   54  LVOT Area:     3.80 cm  LV Volumes (MOD) LV vol d, MOD A2C: 77.8 ml LV vol d, MOD A4C: 77.2 ml LV vol s, MOD A2C: 26.6 ml LV vol s, MOD A4C: 29.9 ml LV SV MOD A2C:     51.2 ml LV SV MOD A4C:     77.2 ml LV SV MOD BP:      51.9 ml RIGHT VENTRICLE RV Basal diam:  3.50 cm RV Mid diam:    3.10 cm RV S prime:     13.60 cm/s TAPSE (M-mode):  3.3 cm LEFT ATRIUM             Index        RIGHT ATRIUM           Index LA diam:        3.80 cm 1.72 cm/m   RA Area:     21.80 cm LA Vol (A2C):   64.6 ml 29.21 ml/m  RA Volume:   65.10 ml  29.44 ml/m LA Vol (A4C):   56.3 ml 25.46 ml/m LA Biplane Vol: 59.8 ml 27.04 ml/m  AORTIC VALVE                    PULMONIC VALVE AV Area (Vmax):    3.44 cm     PV Vmax:       0.72 m/s AV Area (Vmean):   3.34 cm     PV Peak grad:  2.1 mmHg AV Area (VTI):     3.26 cm AV Vmax:           138.00 cm/s AV Vmean:          81.700 cm/s AV VTI:            0.370 m AV Peak Grad:      7.6 mmHg AV Mean Grad:      3.0 mmHg LVOT Vmax:         125.00 cm/s LVOT Vmean:        71.700 cm/s LVOT VTI:          0.317 m LVOT/AV VTI ratio: 0.86  AORTA Ao Root diam: 2.70 cm Ao Asc diam:  4.00 cm MITRAL VALVE               TRICUSPID VALVE MV Area (PHT): 2.69 cm    TR Peak grad:   23.8 mmHg MV Area VTI:   2.92 cm    TR Vmax:        244.00 cm/s MV Peak grad:  2.9 mmHg MV Mean grad:  1.0 mmHg    SHUNTS MV Vmax:       0.85 m/s    Systemic VTI:  0.32 m MV Vmean:      49.0 cm/s   Systemic Diam: 2.20 cm MV Decel Time: 282 msec MV E velocity: 84.40 cm/s MV A velocity: 84.00 cm/s MV E/A ratio:  1.00 Jenkins Rouge MD Electronically signed by Jenkins Rouge MD Signature Date/Time: 06/08/2021/9:59:42 AM    Final     Microbiology: Recent Results (from the past 240 hour(s))  Resp Panel by RT-PCR (Flu A&B, Covid) Nasopharyngeal Swab     Status: None   Collection Time: 06/07/21 12:52 PM   Specimen: Nasopharyngeal Swab; Nasopharyngeal(NP) swabs in vial transport medium  Result Value Ref Range Status   SARS Coronavirus 2 by RT  PCR NEGATIVE NEGATIVE Final    Comment: (NOTE) SARS-CoV-2 target nucleic acids are NOT DETECTED.  The SARS-CoV-2 RNA is generally detectable in upper respiratory specimens during the acute phase of infection. The lowest concentration of SARS-CoV-2 viral copies this assay can detect is 138 copies/mL. A negative result does not preclude SARS-Cov-2 infection and should not be used as the sole basis for treatment or other patient management decisions. A negative result may occur with  improper specimen collection/handling, submission of specimen other than nasopharyngeal swab, presence of viral mutation(s) within the areas targeted by this assay, and inadequate number of viral copies(<138 copies/mL). A negative result must be combined with clinical observations, patient history, and  epidemiological information. The expected result is Negative.  Fact Sheet for Patients:  EntrepreneurPulse.com.au  Fact Sheet for Healthcare Providers:  IncredibleEmployment.be  This test is no t yet approved or cleared by the [place] FDA and  has been authorized for detection and/or diagnosis of SARS-CoV-2 by FDA under an Emergency Use Authorization (EUA). This EUA will remain  in effect (meaning this test can be used) for the duration of the COVID-19 declaration under Section 564(b)(1) of the Act, 21 U.S.C.section 360bbb-3(b)(1), unless the authorization is terminated  or revoked sooner.       Influenza A by PCR NEGATIVE NEGATIVE Final   Influenza B by PCR NEGATIVE NEGATIVE Final    Comment: (NOTE) The Xpert Xpress SARS-CoV-2/FLU/RSV plus assay is intended as an aid in the diagnosis of influenza from Nasopharyngeal swab specimens and should not be used as a sole basis for treatment. Nasal washings and aspirates are unacceptable for Xpert Xpress SARS-CoV-2/FLU/RSV testing.  Fact Sheet for Patients: EntrepreneurPulse.com.au  Fact Sheet for  Healthcare Providers: IncredibleEmployment.be  This test is not yet approved or cleared by the [place] FDA and has been authorized for detection and/or diagnosis of SARS-CoV-2 by FDA under an Emergency Use Authorization (EUA). This EUA will remain in effect (meaning this test can be used) for the duration of the COVID-19 declaration under Section 564(b)(1) of the Act, 21 U.S.C. section 360bbb-3(b)(1), unless the authorization is terminated or revoked.  Performed at [facility], [address] [place], [place] [address]      Labs: Basic Metabolic Panel: Recent Labs  Lab 06/07/21 1053  NA 138  K 4.7  CL 106  CO2 29  GLUCOSE 98  BUN 14  CREATININE 1.25*  CALCIUM 9.6   Liver Function Tests: No results for input(s): AST, ALT, ALKPHOS, BILITOT, PROT, ALBUMIN in the last 168 hours. No results for input(s): LIPASE, AMYLASE in the last 168 hours. No results for input(s): AMMONIA in the last 168 hours. CBC: Recent Labs  Lab 06/07/21 1005  WBC 4.5  HGB 14.6  HCT 44.8  MCV 97.4  PLT 156   Cardiac Enzymes: No results for input(s): CKTOTAL, CKMB, CKMBINDEX, TROPONINI in the last 168 hours. BNP: Invalid input(s): POCBNP CBG: No results for input(s): GLUCAP in the last 168 hours.  Time coordinating discharge:  36 minutes  Signed:  Orson Eva, DO [facility] Pager: [phone] 06/08/2021, 12:08 PM

## 2021-06-08 NOTE — Progress Notes (Signed)
Pt was able to make needs known, and discharge instructions instructions verbally given by this LPN. And pt was walked to the ED entrance by nursing tech terri. Pt denied any other needs and stated was not in any pain or discomfort.

## 2021-06-08 NOTE — Progress Notes (Signed)
Subjective:  Continue to have some pains over night despite r/o HR remains low no AV block   Objective:  Vitals:   06/07/21 1648 06/07/21 2042 06/08/21 0025 06/08/21 0432  BP: 104/71 107/76 102/67 108/64  Pulse: (!) 51 (!) 47 (!) 43 (!) 45  Resp: 16   18  Temp: 98.2 F (36.8 C) 97.8 F (36.6 C) 98.9 F (37.2 C) 97.6 F (36.4 C)  TempSrc: Oral Oral  Oral  SpO2: 100% 99% 98% 96%  Weight: 92.4 kg     Height: 6\' 3"  (1.905 m)       Intake/Output from previous day:  Intake/Output Summary (Last 24 hours) at 06/08/2021 1610 Last data filed at 06/07/2021 1700 Gross per 24 hour  Intake 260 ml  Output --  Net 260 ml    Physical Exam: Affect appropriate Healthy:  appears stated age HEENT: normal Neck supple with no adenopathy JVP normal no bruits no thyromegaly Lungs clear with no wheezing and good diaphragmatic motion Heart:  S1/S2 no murmur, no rub, gallop or click PMI normal Abdomen: benighn, BS positve, no tenderness, no AAA no bruit.  No HSM or HJR Distal pulses intact with no bruits No edema Neuro non-focal Skin warm and dry Post R TKR    Lab Results: Basic Metabolic Panel: Recent Labs    06/07/21 1053  NA 138  K 4.7  CL 106  CO2 29  GLUCOSE 98  BUN 14  CREATININE 1.25*  CALCIUM 9.6   Liver Function Tests: No results for input(s): AST, ALT, ALKPHOS, BILITOT, PROT, ALBUMIN in the last 72 hours. No results for input(s): LIPASE, AMYLASE in the last 72 hours. CBC: Recent Labs    06/07/21 1005  WBC 4.5  HGB 14.6  HCT 44.8  MCV 97.4  PLT 156   Cardiac Enzymes: No results for input(s): CKTOTAL, CKMB, CKMBINDEX, TROPONINI in the last 72 hours. BNP: Invalid input(s): POCBNP D-Dimer: No results for input(s): DDIMER in the last 72 hours. Hemoglobin A1C: No results for input(s): HGBA1C in the last 72 hours. Fasting Lipid Panel: Recent Labs    06/08/21 0543  CHOL 146  HDL 56  LDLCALC 75  TRIG 76  CHOLHDL 2.6     Imaging: DG Chest 2  View  Result Date: 06/07/2021 CLINICAL DATA:  Chest pain EXAM: CHEST - 2 VIEW COMPARISON:  Chest x-ray dated January 10, 2021 FINDINGS: Cardiac and mediastinal contours are within normal limits. Lungs are clear. No pleural effusion or pneumothorax. IMPRESSION: No active cardiopulmonary disease. Electronically Signed   By: Yetta Glassman M.D.   On: 06/07/2021 10:36    Cardiac Studies:  ECG: SB rate 51 normal    Telemetry: Sinus bradycardia   Echo: pending   Medications:    ALPRAZolam  1 mg Oral QHS   aspirin EC  81 mg Oral Daily   atorvastatin  20 mg Oral Daily   enoxaparin (LOVENOX) injection  40 mg Subcutaneous Q24H   feeding supplement  237 mL Oral BID BM   gabapentin  900 mg Oral QID   levothyroxine  25 mcg Oral QAC breakfast   regadenoson  0.4 mg Intravenous Once      Assessment/Plan:   Chest pain:  atypical prolonged sharp r/o no ECG changes Known moderate LAD dx by cardiac CTA July 2021 with negative FFR CT having echo and lexiscan myovue this am Further recs based on results HLD:  continue statin  Thyroid:  continue replacement TSH normal in August Bradycardia:  chronic asymptomatic no beta blocker or AV nodal drugs consider outpatient monitor   Jenkins Rouge 06/08/2021, 9:39 AM

## 2021-07-10 NOTE — Progress Notes (Signed)
CARDIOLOGY CONSULT NOTE       Patient ID: Frank Weber MRN: [id number] DOB/AGE: 1951/05/25 71 y.o.  Referring Physician: [name] Primary Physician: [name], MD Primary Cardiologist: [name]     HPI:  71 y.o. referred by Dr [name] 11/29/19 for CAD risk Complained of SSCP 2-3 weeks on office visit 11/05/19 Seen in [facility] ED for same. Pain on left side and band around entire front and back. Near diaphragm Smoker over 30 pack years. Pain not always related to exertion No associated pleurisy, palpitations, syncope or dyspnea. 2009 had no obstructive CAD at cath. And normal echo in 2016 Pain helped by fentanyl w/u negative with no acute ECG changes, negative Troponin CT no PE some lung nodules and emphysema He had been seen by [facility] Dr Liz Beach and Tami Ribas  He smokes mostly cigars now daily  He is retired after 28 years in [employer] Married x 2 with one son in [place] done 03/24/20 reviewed. Calcium score 227 which was 64 th percentile FFR CT 0.81 mid and 0.78 distal LAD Trial of medical Rx indicated  TTE reviewed from 12/15/19 Normal EF trivial MR/AR   Seen in ER 02/05/20 with diverticulitis Rx with Augmentin and Norco. Also history of bilateral inguinal hernias  No angina. Abdomen improved  Seen by Dr Domenic Polite 01/10/21 at [facility]. 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 After coming Home developed SSCP radiating to left arm Occurred after eating toast and drinking coffee Pain lasted 4-5 hours He R/O and there were no acute ECG changes He also has been having constipation and unintentional weight loss 6 lbs since April His colon 01/11/21 showed polyps,diverticulosis and hemorrhoids Pain Rx with Dilaudid Had some Leg pain and US showed no DVT TTE 01/11/21 EF 60-65% no RWMAls trivial AR and aortic root of 4.0  CXR with NAD  CTA with no PE LLL atelectasis /scarring   Admitted [facility] 12/29 /22 with URI and atypical chest pain Movement/cough with  dull substernal chest pain Quit smoking October 2022 no change with nitro R/O no acute ECG changes COVID/Influenza negative CXR NAD   Myovue done 06/08/21 normal perfusion no ischemia EF 54% Echo 06/08/21 normal EF 60-65% aortic root 40 mm   Lots of dental issues having some pulled and will need implants   ROS All other systems reviewed and negative except as noted above  Past Medical History:  Diagnosis Date   Arthritis    right knee   Constipation    Frequency of urination    GERD (gastroesophageal reflux disease)    Heart murmur    "slight"    History of kidney stones    History of melanoma excision    2012--  NECK/ HEAD   History of squamous cell carcinoma excision    RIGHT EAR   Hyperlipidemia    Hypothyroidism    IBS (irritable bowel syndrome)    states slight   Lumbar stenosis    L5 - S1   Migraines    Peripheral neuropathy    legs and feet   Right flank pain    Right ureteral stone    Urgency of urination    Wears glasses     Family History  Problem Relation Age of Onset   Colon cancer Other        Age of Onset-late 48's    Social History   Socioeconomic History   Marital status: Married    Spouse  name: Not on file   Number of children: Not on file   Years of education: Not on file   Highest education level: Not on file  Occupational History   Not on file  Tobacco Use   Smoking status: Former    Packs/day: 0.00    Years: 30.00    Pack years: 0.00    Types: Cigars, Cigarettes   Smokeless tobacco: Never  Vaping Use   Vaping Use: Never used  Substance and Sexual Activity   Alcohol use: Yes    Comment: seldom    Drug use: No   Sexual activity: Not on file  Other Topics Concern   Not on file  Social History Narrative   Not on file   Social Determinants of Health   Financial Resource Strain: Not on file  Food Insecurity: Not on file  Transportation Needs: Not on file  Physical Activity: Not on file  Stress: Not on file  Social  Connections: Not on file  Intimate Partner Violence: Not on file    Past Surgical History:  Procedure Laterality Date   BIOPSY  01/10/2021   Procedure: BIOPSY;  Surgeon: Montez Morita, Quillian Quince, MD;  Location: [facility];  Service: Gastroenterology;;   CARDIAC CATHETERIZATION  07-08-2007  dr Tami Ribas    no significant CAD, preserved LVF, ef 50%//  30% pLAD   CARDIOVASCULAR STRESS TEST  06-20-2011  dr croitoru   Low risk study/  normal perfusion scan showing attenuation artifact in the anterior region of myocardium with no ischemia , infarct or scar/  normal LV funciton and wall motion , ef 62%   COLONOSCOPY N/A 02/17/2013   Rehman:examination performed to cecum. mild sigmoid colon diverticulosis, 4 small polyps ablated via cold biopsy from rectosigmoid junction (hyperplastic). External hemorrhoids.   COLONOSCOPY WITH PROPOFOL N/A 01/10/2021   Procedure: COLONOSCOPY WITH PROPOFOL;  Surgeon: [name], MD;  Location: [facility];  Service: Gastroenterology;  Laterality: N/A;   CYSTOSCOPY W/ URETERAL STENT PLACEMENT Right 06/18/2015   Procedure: CYSTOSCOPY WITH RIGHT RETROGRADE PYELOGRAM RIGHT URETERAL STENT PLACEMENT;  Surgeon: [name], MD;  Location: [facility];  Service: Urology;  Laterality: Right;   CYSTOSCOPY/URETEROSCOPY/HOLMIUM LASER/STENT PLACEMENT Right 06/27/2015   Procedure: CYSTOSCOPY RIGHT URETEROSCOPY  STENT REMOVAL; STONE EXTRACTION WITH BASKET;  Surgeon: [name], MD;  Location[facility];  Service: Urology;  Laterality: Right;   ESOPHAGOGASTRODUODENOSCOPY (EGD) WITH PROPOFOL N/A 01/10/2021   Procedure: ESOPHAGOGASTRODUODENOSCOPY (EGD) WITH PROPOFOL;  Surgeon: [name], MD;  Location: [facility];  Service: Gastroenterology;  Laterality: N/A;  11:00, moved up per [name] - pt knows arrival time   [place] EXCISIONAL BIOPSY     HOLMIUM LASER APPLICATION Right 25/85/2778   Procedure: HOLMIUM LASER LITHOTRIPSY ;   Surgeon: [name], MD;  Location: [facility];  Service: Urology;  Laterality: Right;   I & D EXTREMITY Right 07/14/2018   Procedure: IRRIGATION AND DEBRIDEMENT RIGHT KNEE WITH CLOSURE;  Surgeon: Nicholes Stairs, MD;  Location: [place];  Service: Orthopedics;  Laterality: Right;   INGUINAL HERNIA REPAIR Left 10/21/2007   KNEE ARTHROSCOPY W/ ACL RECONSTRUCTION Right 1992   MENISCUS REPAIR Right    2019, dr Theda Sers    POLYPECTOMY  01/10/2021   Procedure: POLYPECTOMY;  Surgeon: [name], MD;  [facility];  Service: Gastroenterology;;   SHOULDER ARTHROSCOPY WITH OPEN ROTATOR CUFF REPAIR Left 07/27/2014   Procedure: LEFT SHOULDER ARTHROSCOPY WITH MINI OPEN  ROTATOR CUFF REPAIR;  Surgeon: [name], MD;  Location: [place];  Service: Orthopedics;  Laterality: Left;   SHOULDER ARTHROSCOPY WITH OPEN ROTATOR CUFF REPAIR Right 2006   TONSILLECTOMY AND ADENOIDECTOMY  age 78   TOTAL KNEE ARTHROPLASTY Right 07/10/2018   Procedure: TOTAL KNEE ARTHROPLASTY;  Surgeon: [name], MD;  Location: [facility];  Service: Orthopedics;  Laterality: Right;   TRANSTHORACIC ECHOCARDIOGRAM  07/30/2007   normal LVF, ef >55%/  mild AR, MR , PR and TR/  mild AV sclerosis without stenosis/    URETEROSCOPY Right 06/18/2015   Procedure: URETEROSCOPY;  Surgeon: [name], MD;  Location: [facility];  Service: Urology;  Laterality: Right;      Current Outpatient Medications:    ALPRAZOLAM XR 1 MG 24 hr tablet, Take 1 mg by mouth at bedtime., Disp: , Rfl:    aspirin EC 81 MG tablet, Take 1 tablet (81 mg total) by mouth daily. Swallow whole., Disp: 90 tablet, Rfl: 3   atorvastatin (LIPITOR) 20 MG tablet, Take 20 mg by mouth daily., Disp: , Rfl:    calcium elemental as carbonate (TUMS ULTRA 1000) 400 MG chewable tablet, Chew 2,000 mg by mouth daily as needed for heartburn., Disp: , Rfl:    Cholecalciferol (DIALYVITE VITAMIN D 5000) 125 MCG (5000 UT) capsule, Take  5,000 Units by mouth daily., Disp: , Rfl:    cyclobenzaprine (FLEXERIL) 5 MG tablet, Take 1 tablet (5 mg total) by mouth 3 (three) times daily as needed for muscle spasms., Disp: 21 tablet, Rfl: 0   gabapentin (NEURONTIN) 300 MG capsule, Take 900 mg by mouth 4 (four) times daily., Disp: , Rfl:    levothyroxine (SYNTHROID, LEVOTHROID) 25 MCG tablet, Take 25 mcg by mouth daily before breakfast., Disp: , Rfl:    LORazepam (ATIVAN) 0.5 MG tablet, Take 0.5-1 mg by mouth daily as needed for anxiety., Disp: , Rfl:    nitroGLYCERIN (NITROSTAT) 0.4 MG SL tablet, Place 1 tablet (0.4 mg total) under the tongue every 5 (five) minutes as needed., Disp: 25 tablet, Rfl: 3   Polyethyl Glycol-Propyl Glycol (SYSTANE ULTRA) 0.4-0.3 % SOLN, Place 2 drops into both eyes as needed (for dry eyes). , Disp: , Rfl:    Probiotic Product (TRUBIOTICS PO), Take 1 capsule by mouth at bedtime., Disp: , Rfl:    promethazine (PHENERGAN) 12.5 MG tablet, Take 1 tablet (12.5 mg total) by mouth every 6 (six) hours as needed for nausea or vomiting., Disp: 20 tablet, Rfl: 0   rizatriptan (MAXALT-MLT) 10 MG disintegrating tablet, Take 10 mg by mouth as needed (for migraines and may repeat once in 2 hours, if no relief). , Disp: , Rfl:    vitamin k 100 MCG tablet, Take 100 mcg by mouth daily., Disp: , Rfl:     Physical Exam: Blood pressure 112/62, pulse 62, height 6\' 3"  (1.905 m), weight 206 lb (93.4 kg), SpO2 98 %.    Affect appropriate Healthy:  appears stated age 82: normal Neck supple with no adenopathy JVP normal no bruits no thyromegaly Lungs clear with no wheezing and good diaphragmatic motion Heart:  S1/S2 SEM  murmur, no rub, gallop or click PMI normal Abdomen: benighn, BS positve, no tenderness, no AAA no bruit.  No HSM or HJR Distal pulses intact with no bruits No edema Neuro non-focal Skin warm and dry No muscular weakness   Labs:   Lab Results  Component Value Date   WBC 4.5 06/07/2021   HGB 14.6  06/07/2021  HCT 44.8 06/07/2021   MCV 97.4 06/07/2021   PLT 156 06/07/2021    No results for input(s): NA, K, CL, CO2, BUN, CREATININE, CALCIUM, PROT, BILITOT, ALKPHOS, ALT, AST, GLUCOSE in the last 168 hours.  Invalid input(s): LABALBU  Lab Results  Component Value Date   TROPONINI <0.03 08/01/2014    Lab Results  Component Value Date   CHOL 146 06/08/2021   CHOL 112 01/11/2021   Lab Results  Component Value Date   HDL 56 06/08/2021   HDL 48 01/11/2021   Lab Results  Component Value Date   LDLCALC 75 06/08/2021   LDLCALC 60 01/11/2021   Lab Results  Component Value Date   TRIG 76 06/08/2021   TRIG 22 01/11/2021   Lab Results  Component Value Date   CHOLHDL 2.6 06/08/2021   CHOLHDL 2.3 01/11/2021   No results found for: LDLDIRECT    Radiology: No results found.  EKG: SR rate 55 LAD no acute ST changes 01/10/21 SR RAD normal    ASSESSMENT AND PLAN:   1. Chest Pain:  Normal cath in 2016 atypical pain with moderate LAD disease by cardiac CTA/FFR CT 03/22/20 Continue ASA/Statin  Not on beta blocker due to low resting HR And BP  Pain 01/11/21 appears to be related to his EGD with biopsy and GI w/u CTA negative.  Has had SSCP from GI/URI etiology not heart Myovue done 06/08/21 non ischemic normal EF and echo same day EF 60-65% Continue medical Rx   2. HLD:  On statin labs with primary   3. Murmur: 2/6 SEM AV sclerosis  by echo 06/08/21 stable   4. GI:  post EGD/colon 01/10/21 Barrett's and diverticulosis f/u GI constipation and weight loss   F/U in a year    Signed: Jenkins Rouge 07/18/2021, 1:23 PM

## 2021-07-18 ENCOUNTER — Encounter: Payer: Self-pay | Admitting: Cardiovascular Disease

## 2021-07-18 ENCOUNTER — Other Ambulatory Visit: Payer: Self-pay

## 2021-07-18 ENCOUNTER — Ambulatory Visit (INDEPENDENT_AMBULATORY_CARE_PROVIDER_SITE_OTHER): Payer: Medicare Other | Admitting: Cardiovascular Disease

## 2021-07-18 VITALS — BP 112/62 | HR 62 | Ht 75.0 in | Wt 206.0 lb

## 2021-07-18 DIAGNOSIS — K22710 Barrett's esophagus with low grade dysplasia: Secondary | ICD-10-CM | POA: Diagnosis not present

## 2021-07-18 DIAGNOSIS — I2510 Atherosclerotic heart disease of native coronary artery without angina pectoris: Secondary | ICD-10-CM | POA: Diagnosis not present

## 2021-07-18 DIAGNOSIS — R011 Cardiac murmur, unspecified: Secondary | ICD-10-CM | POA: Diagnosis not present

## 2021-07-18 DIAGNOSIS — E782 Mixed hyperlipidemia: Secondary | ICD-10-CM

## 2021-07-18 DIAGNOSIS — R079 Chest pain, unspecified: Secondary | ICD-10-CM

## 2021-07-18 MED ORDER — NITROGLYCERIN 0.4 MG SL SUBL: 0.4000 mg | SUBLINGUAL_TABLET | SUBLINGUAL | 3 refills | Status: AC | PRN

## 2021-07-18 NOTE — Patient Instructions (Signed)
Medication Instructions:  Your physician recommends that you continue on your current medications as directed. Please refer to the Current Medication list given to you today.  *If you need a refill on your cardiac medications before your next appointment, please call your pharmacy*   Lab Work: NONE   If you have labs (blood work) drawn today and your tests are completely normal, you will receive your results only by: [place] (if you have MyChart) OR A paper copy in the mail If you have any lab test that is abnormal or we need to change your treatment, we will call you to review the results.   Testing/Procedures: NONE    Follow-Up: At [facility], you and your health needs are our priority.  As part of our continuing mission to provide you with exceptional heart care, we have created designated Provider Care Teams.  These Care Teams include your primary Cardiologist (physician) and Advanced Practice Providers (APPs -  Physician Assistants and Nurse Practitioners) who all work together to provide you with the care you need, when you need it.  We recommend signing up for the patient portal called "MyChart".  Sign up information is provided on this After Visit Summary.  MyChart is used to connect with patients for Virtual Visits (Telemedicine).  Patients are able to view lab/test results, encounter notes, upcoming appointments, etc.  Non-urgent messages can be sent to your provider as well.   To learn more about what you can do with MyChart, go to [email or web address].    Your next appointment:   1 Year   The format for your next appointment:   In Person  Provider:   Jenkins Rouge, MD    Other Instructions Thank you for choosing [facility]!

## 2021-07-23 DIAGNOSIS — Z20822 Contact with and (suspected) exposure to covid-19: Secondary | ICD-10-CM | POA: Diagnosis not present

## 2021-08-09 DIAGNOSIS — Z20822 Contact with and (suspected) exposure to covid-19: Secondary | ICD-10-CM | POA: Diagnosis not present

## 2021-08-16 DIAGNOSIS — F419 Anxiety disorder, unspecified: Secondary | ICD-10-CM | POA: Diagnosis not present

## 2021-08-16 DIAGNOSIS — E663 Overweight: Secondary | ICD-10-CM | POA: Diagnosis not present

## 2021-08-16 DIAGNOSIS — Z6825 Body mass index (BMI) 25.0-25.9, adult: Secondary | ICD-10-CM | POA: Diagnosis not present

## 2021-08-16 DIAGNOSIS — G4709 Other insomnia: Secondary | ICD-10-CM | POA: Diagnosis not present

## 2021-08-20 ENCOUNTER — Emergency Department (HOSPITAL_COMMUNITY)
Admission: EM | Admit: 2021-08-20 | Discharge: 2021-08-20 | Disposition: A | Discharge status: Home / Self Care | Payer: Medicare Other | Attending: Emergency Medicine | Admitting: Emergency Medicine

## 2021-08-20 ENCOUNTER — Emergency Department (HOSPITAL_COMMUNITY): Payer: Medicare Other

## 2021-08-20 ENCOUNTER — Telehealth (INDEPENDENT_AMBULATORY_CARE_PROVIDER_SITE_OTHER): Payer: Self-pay | Admitting: *Deleted

## 2021-08-20 ENCOUNTER — Encounter (HOSPITAL_COMMUNITY): Payer: Self-pay | Admitting: *Deleted

## 2021-08-20 DIAGNOSIS — K5732 Diverticulitis of large intestine without perforation or abscess without bleeding: Secondary | ICD-10-CM | POA: Diagnosis not present

## 2021-08-20 DIAGNOSIS — K589 Irritable bowel syndrome without diarrhea: Secondary | ICD-10-CM | POA: Diagnosis not present

## 2021-08-20 DIAGNOSIS — R110 Nausea: Secondary | ICD-10-CM | POA: Diagnosis not present

## 2021-08-20 DIAGNOSIS — K5730 Diverticulosis of large intestine without perforation or abscess without bleeding: Secondary | ICD-10-CM | POA: Diagnosis not present

## 2021-08-20 DIAGNOSIS — K5792 Diverticulitis of intestine, part unspecified, without perforation or abscess without bleeding: Secondary | ICD-10-CM | POA: Diagnosis not present

## 2021-08-20 DIAGNOSIS — R1032 Left lower quadrant pain: Secondary | ICD-10-CM | POA: Diagnosis present

## 2021-08-20 LAB — COMPREHENSIVE METABOLIC PANEL
ALT: 12 U/L (ref 0–44)
AST: 13 U/L — ABNORMAL LOW (ref 15–41)
Albumin: 4.1 g/dL (ref 3.5–5.0)
Alkaline Phosphatase: 64 U/L (ref 38–126)
Anion gap: 8 (ref 5–15)
BUN: 10 mg/dL (ref 8–23)
CO2: 27 mmol/L (ref 22–32)
Calcium: 9.4 mg/dL (ref 8.9–10.3)
Chloride: 104 mmol/L (ref 98–111)
Creatinine, Ser: 1.25 mg/dL — ABNORMAL HIGH (ref 0.61–1.24)
GFR, Estimated: >60 mL/min (ref >60)
Glucose, Bld: 93 mg/dL (ref 70–99)
Potassium: 4.2 mmol/L (ref 3.5–5.1)
Sodium: 139 mmol/L (ref 135–145)
Total Bilirubin: 0.7 mg/dL (ref 0.3–1.2)
Total Protein: 6.7 g/dL (ref 6.5–8.1)

## 2021-08-20 LAB — CBC WITH DIFFERENTIAL/PLATELET
Abs Immature Granulocytes: 0.01 10*3/uL (ref 0.00–0.07)
Basophils Absolute: 0 10*3/uL (ref 0.0–0.1)
Basophils Relative: 1 %
Eosinophils Absolute: 0.1 10*3/uL (ref 0.0–0.5)
Eosinophils Relative: 2 %
HCT: 46.2 % (ref 39.0–52.0)
Hemoglobin: 15 g/dL (ref 13.0–17.0)
Immature Granulocytes: 0 %
Lymphocytes Relative: 21 %
Lymphs Abs: 1.2 10*3/uL (ref 0.7–4.0)
MCH: 32.4 pg (ref 26.0–34.0)
MCHC: 32.5 g/dL (ref 30.0–36.0)
MCV: 99.8 fL (ref 80.0–100.0)
Monocytes Absolute: 0.5 10*3/uL (ref 0.1–1.0)
Monocytes Relative: 8 %
Neutro Abs: 3.8 10*3/uL (ref 1.7–7.7)
Neutrophils Relative %: 68 %
Platelets: 200 10*3/uL (ref 150–400)
RBC: 4.63 MIL/uL (ref 4.22–5.81)
RDW: 12.5 % (ref 11.5–15.5)
WBC: 5.7 10*3/uL (ref 4.0–10.5)
nRBC: 0 % (ref 0.0–0.2)

## 2021-08-20 MED ORDER — METRONIDAZOLE 500 MG PO TABS: 500.0000 mg | ORAL_TABLET | Freq: Three times a day (TID) | ORAL | 0 refills | Status: AC

## 2021-08-20 MED ORDER — ONDANSETRON HCL 4 MG/2ML IJ SOLN
4.0000 mg | Freq: Once | INTRAMUSCULAR | Status: AC
  Administered 2021-08-20: 4 mg via INTRAVENOUS
  Filled 2021-08-20: qty 2

## 2021-08-20 MED ORDER — ONDANSETRON HCL 4 MG PO TABS: 4.0000 mg | ORAL_TABLET | Freq: Four times a day (QID) | ORAL | 0 refills | Status: DC

## 2021-08-20 MED ORDER — CIPROFLOXACIN HCL 500 MG PO TABS: 500.0000 mg | ORAL_TABLET | Freq: Two times a day (BID) | ORAL | 0 refills | Status: AC

## 2021-08-20 MED ORDER — IOHEXOL 300 MG/ML  SOLN
100.0000 mL | Freq: Once | INTRAMUSCULAR | Status: AC | PRN
  Administered 2021-08-20: 100 mL via INTRAVENOUS

## 2021-08-20 NOTE — Telephone Encounter (Signed)
Patient called and states last couple of weeks he has had constipation and abdominal pain. Feels like he has a blockage. Having some nausea and not able to eat as much as normal. Some swelling in abdomen. Has tried miralax, laxative, suppositories and an enema. Patient advised he should go to ED. Pt states he will go to [facility].  ?

## 2021-08-20 NOTE — Discharge Instructions (Addendum)
You were seen in the emergency department today for abdominal pain and concern for constipation.  We scanned your abdomen which shows that you have diverticulitis which is an inflammation of the small sacs in the end of your colon.  This requires antibiotics.  I have prescribed you 2 antibiotics.  You will take ciprofloxacin twice a day for the next 7 days.  In addition you will take metronidazole 500 mg 3 times a day for the next 7 days.  You have been prescribed an antibiotic called metronidazole, also known as Flagyl. Some side effects of metronidazole you should be aware of are nausea and headache. Take the medication with food to minimize stomach upset. It is imperative that you do not drink alcohol while taking this antibiotic. Please take the medication as prescribed. Please take the medication until your antibiotic course is complete.  I am also prescribing you Zofran which you can use every 6 hours as needed for nausea.  Please follow-up with your GI doctor in 1 week.  Please return for fever and worsening abdominal pain. ?

## 2021-08-20 NOTE — ED Triage Notes (Signed)
Abdominal pain for the past 2 weeks getting worse ?

## 2021-08-20 NOTE — ED Provider Notes (Incomplete)
[facility] EMERGENCY DEPARTMENT Provider Note   CSN: [id number] Arrival date & time: 08/20/21  1558     History {Add pertinent medical, surgical, social history, OB history to HPI:1} Chief Complaint  Patient presents with   Abdominal Pain    Frank Weber is a 71 y.o. male.   Abdominal Pain     Home Medications Prior to Admission medications   Medication Sig Start Date End Date Taking? Authorizing Provider  ALPRAZOLAM XR 1 MG 24 hr tablet Take 1 mg by mouth at bedtime. 11/17/19   [provider]  aspirin EC 81 MG tablet Take 1 tablet (81 mg total) by mouth daily. Swallow whole. 12/24/19   Josue Hector, MD  atorvastatin (LIPITOR) 20 MG tablet Take 20 mg by mouth daily. 11/09/19   [provider]  calcium elemental as carbonate (TUMS ULTRA 1000) 400 MG chewable tablet Chew 2,000 mg by mouth daily as needed for heartburn.    [provider]  Cholecalciferol (DIALYVITE VITAMIN D 5000) 125 MCG (5000 UT) capsule Take 5,000 Units by mouth daily.    [provider]  cyclobenzaprine (FLEXERIL) 5 MG tablet Take 1 tablet (5 mg total) by mouth 3 (three) times daily as needed for muscle spasms. 06/08/21   Orson Eva, MD  gabapentin (NEURONTIN) 300 MG capsule Take 900 mg by mouth 4 (four) times daily.    [provider]  levothyroxine (SYNTHROID, LEVOTHROID) 25 MCG tablet Take 25 mcg by mouth daily before breakfast.    [provider]  LORazepam (ATIVAN) 0.5 MG tablet Take 0.5-1 mg by mouth daily as needed for anxiety.    [provider]  nitroGLYCERIN (NITROSTAT) 0.4 MG SL tablet Place 1 tablet (0.4 mg total) under the tongue every 5 (five) minutes as needed. 07/18/21   Josue Hector, MD  Polyethyl Glycol-Propyl Glycol (SYSTANE ULTRA) 0.4-0.3 % SOLN Place 2 drops into both eyes as needed (for dry eyes).     [provider]  Probiotic Product (TRUBIOTICS PO) Take 1 capsule by mouth at bedtime.    [provider]   promethazine (PHENERGAN) 12.5 MG tablet Take 1 tablet (12.5 mg total) by mouth every 6 (six) hours as needed for nausea or vomiting. 01/11/21   Bonnielee Haff, MD  rizatriptan (MAXALT-MLT) 10 MG disintegrating tablet Take 10 mg by mouth as needed (for migraines and may repeat once in 2 hours, if no relief).     [provider]  vitamin k 100 MCG tablet Take 100 mcg by mouth daily.    [provider]      Allergies    Sulfa antibiotics and Coconut oil    Review of Systems   Review of Systems  Gastrointestinal:  Positive for abdominal pain.   Physical Exam Updated Vital Signs BP 122/84 (BP Location: Right Arm)    Pulse (!) 53    Temp 97.8 F (36.6 C) (Oral)    Resp 20    Ht '6\' 3"'$  (1.905 m)    Wt 92.1 kg    SpO2 98%    BMI 25.37 kg/m  Physical Exam  ED Results / Procedures / Treatments   Labs (all labs ordered are listed, but only abnormal results are displayed) Labs Reviewed - No data to display  EKG None  Radiology No results found.  Procedures Procedures  {Document cardiac monitor, telemetry assessment procedure when appropriate:1}  Medications Ordered in ED Medications - No data to display  ED Course/ Medical Decision Making/ A&P  Medical Decision Making  ***  {Document critical care time when appropriate:1} {Document review of labs and clinical decision tools ie heart score, Chads2Vasc2 etc:1}  {Document your independent review of radiology images, and any outside records:1} {Document your discussion with family members, caretakers, and with consultants:1} {Document social determinants of health affecting pt's care:1} {Document your decision making why or why not admission, treatments were needed:1} Final Clinical Impression(s) / ED Diagnoses Final diagnoses:  None    Rx / DC Orders ED Discharge Orders     None

## 2021-08-23 ENCOUNTER — Telehealth (INDEPENDENT_AMBULATORY_CARE_PROVIDER_SITE_OTHER): Payer: Self-pay

## 2021-08-23 NOTE — Telephone Encounter (Signed)
Patient aware of all. He states he has started running a low grade temp and diarrhea. I advised that if symptoms worsen he will need to follow up with pcp. ?

## 2021-08-23 NOTE — Telephone Encounter (Signed)
Can hold off on the MiraLAX for now until he finishes his antibiotics.  However if he has not had a bowel movement 3 days he can restart taking earlier. ?

## 2021-08-23 NOTE — Telephone Encounter (Signed)
Patient called today states he was recently seen in Ed on 08/20/2021 for diverticulitis. He is currently on Two antibiotics and takes miralax bid usually. He took miralax yesterday morning and has not taking it again. He wants to know if he should be taking this while he has the flare and on Antibiotics. He also says he has not had a complete bowel movement since Saturday. Please advise.  ?

## 2021-08-25 DIAGNOSIS — K5792 Diverticulitis of intestine, part unspecified, without perforation or abscess without bleeding: Secondary | ICD-10-CM | POA: Diagnosis not present

## 2021-08-26 ENCOUNTER — Encounter (HOSPITAL_COMMUNITY): Payer: Self-pay

## 2021-08-26 ENCOUNTER — Emergency Department (HOSPITAL_COMMUNITY): Payer: Medicare Other

## 2021-08-26 ENCOUNTER — Other Ambulatory Visit: Payer: Self-pay

## 2021-08-26 ENCOUNTER — Emergency Department (HOSPITAL_COMMUNITY)
Admission: EM | Admit: 2021-08-26 | Discharge: 2021-08-26 | Disposition: A | Discharge status: Home / Self Care | Payer: Medicare Other | Attending: Emergency Medicine | Admitting: Emergency Medicine

## 2021-08-26 DIAGNOSIS — Z7982 Long term (current) use of aspirin: Secondary | ICD-10-CM | POA: Insufficient documentation

## 2021-08-26 DIAGNOSIS — R439 Unspecified disturbances of smell and taste: Secondary | ICD-10-CM | POA: Diagnosis not present

## 2021-08-26 DIAGNOSIS — T373X5A Adverse effect of other antiprotozoal drugs, initial encounter: Secondary | ICD-10-CM | POA: Diagnosis not present

## 2021-08-26 DIAGNOSIS — R079 Chest pain, unspecified: Secondary | ICD-10-CM

## 2021-08-26 DIAGNOSIS — R0789 Other chest pain: Secondary | ICD-10-CM | POA: Diagnosis not present

## 2021-08-26 DIAGNOSIS — T368X5A Adverse effect of other systemic antibiotics, initial encounter: Secondary | ICD-10-CM | POA: Insufficient documentation

## 2021-08-26 DIAGNOSIS — T50905A Adverse effect of unspecified drugs, medicaments and biological substances, initial encounter: Secondary | ICD-10-CM

## 2021-08-26 LAB — CBC
HCT: 50.3 % (ref 39.0–52.0)
Hemoglobin: 16.5 g/dL (ref 13.0–17.0)
MCH: 32 pg (ref 26.0–34.0)
MCHC: 32.8 g/dL (ref 30.0–36.0)
MCV: 97.5 fL (ref 80.0–100.0)
Platelets: 210 10*3/uL (ref 150–400)
RBC: 5.16 MIL/uL (ref 4.22–5.81)
RDW: 12.3 % (ref 11.5–15.5)
WBC: 5.7 10*3/uL (ref 4.0–10.5)
nRBC: 0 % (ref 0.0–0.2)

## 2021-08-26 LAB — TROPONIN I (HIGH SENSITIVITY)
Troponin I (High Sensitivity): 3 ng/L (ref <18)
Troponin I (High Sensitivity): 3 ng/L (ref <18)

## 2021-08-26 LAB — COMPREHENSIVE METABOLIC PANEL
ALT: 19 U/L (ref 0–44)
AST: 24 U/L (ref 15–41)
Albumin: 4.5 g/dL (ref 3.5–5.0)
Alkaline Phosphatase: 67 U/L (ref 38–126)
Anion gap: 8 (ref 5–15)
BUN: 8 mg/dL (ref 8–23)
CO2: 29 mmol/L (ref 22–32)
Calcium: 9.9 mg/dL (ref 8.9–10.3)
Chloride: 104 mmol/L (ref 98–111)
Creatinine, Ser: 1.32 mg/dL — ABNORMAL HIGH (ref 0.61–1.24)
GFR, Estimated: 58 mL/min — ABNORMAL LOW (ref >60)
Glucose, Bld: 104 mg/dL — ABNORMAL HIGH (ref 70–99)
Potassium: 4.2 mmol/L (ref 3.5–5.1)
Sodium: 141 mmol/L (ref 135–145)
Total Bilirubin: 0.8 mg/dL (ref 0.3–1.2)
Total Protein: 7.1 g/dL (ref 6.5–8.1)

## 2021-08-26 LAB — LIPASE, BLOOD: Lipase: 27 U/L (ref 11–51)

## 2021-08-26 MED ORDER — ONDANSETRON HCL 4 MG/2ML IJ SOLN
4.0000 mg | Freq: Once | INTRAMUSCULAR | Status: AC
  Administered 2021-08-26: 4 mg via INTRAVENOUS
  Filled 2021-08-26: qty 2

## 2021-08-26 MED ORDER — KETOROLAC TROMETHAMINE 30 MG/ML IJ SOLN
15.0000 mg | Freq: Once | INTRAMUSCULAR | Status: AC
  Administered 2021-08-26: 15 mg via INTRAVENOUS
  Filled 2021-08-26: qty 1

## 2021-08-26 MED ORDER — SODIUM CHLORIDE 0.9 % IV BOLUS
1000.0000 mL | Freq: Once | INTRAVENOUS | Status: AC
  Administered 2021-08-26: 1000 mL via INTRAVENOUS

## 2021-08-26 NOTE — ED Provider Notes (Signed)
?[facility] ?Provider Note ? ? ?CSN: [id number] ?Arrival date & time: 08/26/21  1040 ? ?  ? ?History ? ?Chief Complaint  ?Patient presents with  ? Chest Pain  ? ? ?Frank Weber is a 71 y.o. male presenting with multiple complaints, the primary being left-sided chest pressure which radiates into his neck and has been present since yesterday evening.  He is currently on day 6 of Cipro and Flagyl for treatment of acute diverticulitis which he states is improving, although has complaints of metallic taste in his mouth possibly side effect to his antibiotics.  He was sitting in his home yesterday evening when he developed pressure sensation in his left chest region, he also describes a wave of tingling which started at his waist bilaterally and radiates up into his upper chest and face and neck.  Also into his shoulders and has been persistent.  He denies shortness of breath, palpitations, vomiting but has had some mild nausea since his diagnosis of diverticulitis.  He is also noted some transient lightheadedness for the past couple of days.  He does endorse reduced p.o. intake secondary to the diverticulitis symptoms.  He has had no treatment prior to arrival. ? ?He does note similar chest symptoms for which he was evaluated here as an inpatient including Cardiology consult by Dr. Domenic Polite.  Past cardiac history including a cath in 2009 showing no significant CAD.  He also had a coronary CT July 2021 which showed moderate LAD stenosis but no indication for further intervention at that time.  Most recently he had a nuclear medicine cardiology testing  06/08/2021 with no evidence of ischemia and which showed normal  LV function. ? ?The history is provided by the patient.  ? ?HPI: A 71 year old patient with a history of hypercholesterolemia presents for evaluation of chest pain. Initial onset of pain was more than 6 hours ago. The patient's chest pain is described as heaviness/pressure/tightness and  is not worse with exertion. The patient's chest pain is middle- or left-sided, is not well-localized, is not sharp and does radiate to the arms/jaw/neck. The patient does not complain of nausea and denies diaphoresis. The patient has no history of stroke, has no history of peripheral artery disease, has not smoked in the past 90 days, denies any history of treated diabetes, has no relevant family history of coronary artery disease (first degree relative at less than age 61), is not hypertensive and does not have an elevated BMI (>=30).  ? ?Home Medications ?Prior to Admission medications   ?Medication Sig Start Date End Date Taking? Authorizing Provider  ?ALPRAZOLAM XR 1 MG 24 hr tablet Take 1 mg by mouth at bedtime. 11/17/19   [provider]  ?aspirin EC 81 MG tablet Take 1 tablet (81 mg total) by mouth daily. Swallow whole. 12/24/19   Josue Hector, MD  ?atorvastatin (LIPITOR) 20 MG tablet Take 20 mg by mouth daily. 11/09/19   [provider]  ?calcium elemental as carbonate (TUMS ULTRA 1000) 400 MG chewable tablet Chew 2,000 mg by mouth daily as needed for heartburn.    [provider]  ?Cholecalciferol (DIALYVITE VITAMIN D 5000) 125 MCG (5000 UT) capsule Take 5,000 Units by mouth daily.    [provider]  ?ciprofloxacin (CIPRO) 500 MG tablet Take 1 tablet (500 mg total) by mouth 2 (two) times daily for 7 days. 08/20/21 08/27/21  Mickie Hillier, PA-C  ?cyclobenzaprine (FLEXERIL) 5 MG tablet Take 1 tablet (5 mg total) by  mouth 3 (three) times daily as needed for muscle spasms. 06/08/21   Orson Eva, MD  ?gabapentin (NEURONTIN) 300 MG capsule Take 900 mg by mouth 4 (four) times daily.    [provider]  ?levothyroxine (SYNTHROID, LEVOTHROID) 25 MCG tablet Take 25 mcg by mouth daily before breakfast.    [provider]  ?LORazepam (ATIVAN) 0.5 MG tablet Take 0.5-1 mg by mouth daily as needed for anxiety.    [provider]  ?metroNIDAZOLE (FLAGYL) 500 MG  tablet Take 1 tablet (500 mg total) by mouth 3 (three) times daily for 7 days. 08/20/21 08/27/21  Mickie Hillier, PA-C  ?nitroGLYCERIN (NITROSTAT) 0.4 MG SL tablet Place 1 tablet (0.4 mg total) under the tongue every 5 (five) minutes as needed. 07/18/21   Josue Hector, MD  ?ondansetron (ZOFRAN) 4 MG tablet Take 1 tablet (4 mg total) by mouth every 6 (six) hours. 08/20/21   Mickie Hillier, PA-C  ?Polyethyl Glycol-Propyl Glycol (SYSTANE ULTRA) 0.4-0.3 % SOLN Place 2 drops into both eyes as needed (for dry eyes).     [provider]  ?Probiotic Product (TRUBIOTICS PO) Take 1 capsule by mouth at bedtime.    [provider]  ?promethazine (PHENERGAN) 12.5 MG tablet Take 1 tablet (12.5 mg total) by mouth every 6 (six) hours as needed for nausea or vomiting. 01/11/21   Bonnielee Haff, MD  ?rizatriptan (MAXALT-MLT) 10 MG disintegrating tablet Take 10 mg by mouth as needed (for migraines and may repeat once in 2 hours, if no relief).     [provider]  ?vitamin k 100 MCG tablet Take 100 mcg by mouth daily.    [provider]  ?   ? ?Allergies    ?Sulfa antibiotics and Coconut oil   ? ?Review of Systems   ?Review of Systems  ?Constitutional:  Negative for chills and fever.  ?HENT:  Negative for congestion and sore throat.   ?Eyes: Negative.   ?Respiratory:  Negative for chest tightness and shortness of breath.   ?Cardiovascular:  Positive for chest pain. Negative for palpitations and leg swelling.  ?Gastrointestinal:  Positive for abdominal pain and nausea. Negative for vomiting.  ?Genitourinary: Negative.   ?Musculoskeletal:  Negative for arthralgias, joint swelling and neck pain.  ?Skin: Negative.  Negative for rash and wound.  ?Neurological:  Positive for light-headedness and numbness. Negative for dizziness, weakness and headaches.  ?Psychiatric/Behavioral: Negative.    ? ?Physical Exam ?Updated Vital Signs ?BP 116/82   Pulse 62   Temp 97.7 ?F (36.5 ?C) (Oral)   Resp 16   Ht '6\' 3"'$   (1.905 m)   Wt 91.3 kg   SpO2 99%   BMI 25.15 kg/m?  ?Physical Exam ?Vitals and nursing note reviewed.  ?Constitutional:   ?   Appearance: He is well-developed.  ?HENT:  ?   Head: Normocephalic and atraumatic.  ?Eyes:  ?   Conjunctiva/sclera: Conjunctivae normal.  ?Cardiovascular:  ?   Rate and Rhythm: Normal rate and regular rhythm.  ?   Heart sounds: Normal heart sounds.  ?Pulmonary:  ?   Effort: Pulmonary effort is normal.  ?   Breath sounds: Normal breath sounds. No decreased breath sounds, wheezing or rhonchi.  ?Chest:  ?   Chest wall: No tenderness.  ?Abdominal:  ?   General: Bowel sounds are normal.  ?   Palpations: Abdomen is soft.  ?   Tenderness: There is no abdominal tenderness.  ?Musculoskeletal:     ?   General:  Normal range of motion.  ?   Cervical back: Normal range of motion.  ?Skin: ?   General: Skin is warm and dry.  ?Neurological:  ?   General: No focal deficit present.  ?   Mental Status: He is alert.  ? ? ?ED Results / Procedures / Treatments   ?Labs ?(all labs ordered are listed, but only abnormal results are displayed) ?Labs Reviewed  ?COMPREHENSIVE METABOLIC PANEL - Abnormal; Notable for the following components:  ?    Result Value  ? Glucose, Bld 104 (*)   ? Creatinine, Ser 1.32 (*)   ? GFR, Estimated 58 (*)   ? All other components within normal limits  ?CBC  ?LIPASE, BLOOD  ?TROPONIN I (HIGH SENSITIVITY)  ?TROPONIN I (HIGH SENSITIVITY)  ? ? ?EKG ?EKG Interpretation ? ?Date/Time:  Sunday August 26 2021 10:45:49 EDT ?Ventricular Rate:  54 ?PR Interval:  160 ?QRS Duration: 88 ?QT Interval:  432 ?QTC Calculation: 409 ?R Axis:   113 ?Text Interpretation: Sinus bradycardia Right axis deviation Abnormal ECG When compared with ECG of 07-Jun-2021 09:58, PREVIOUS ECG IS PRESENT Confirmed by Godfrey Pick [phone]) on 08/26/2021 11:49:25 AM ? ?Radiology ?DG Chest 2 View ? ?Result Date: 08/26/2021 ?CLINICAL DATA:  Left-sided chest pain. EXAM: CHEST - 2 VIEW COMPARISON:  Chest x-ray dated June 07, 2021.  FINDINGS: The heart size and mediastinal contours are within normal limits. Both lungs are clear. The visualized skeletal structures are unremarkable. IMPRESSION: No active cardiopulmonary disease. Electronic

## 2021-08-26 NOTE — ED Triage Notes (Signed)
Pt presents to ED with complaints of left sided chest pain radiating into his neck and tinging from his waist up into his face and bilateral extremities, started last night. Pt was recently in ER and diagnosed with diverticulitis, started on Flagyl and Cipro. Pt also complaining of dizziness which has been off and on for the last couple of days. ?

## 2021-08-26 NOTE — Discharge Instructions (Signed)
Your lab tests and x-ray and EKG are reassuring today.  It is possible at least some of your symptoms are medication side effects, especially the metallic taste in your mouth, this is classic side effect of Flagyl.  Since your abdominal pain is improved we recommend discontinuing these medications at this time.  Plan to see Dr. Nolon Lennert for recheck this week if your symptoms persist. ?

## 2021-08-31 DIAGNOSIS — Z20822 Contact with and (suspected) exposure to covid-19: Secondary | ICD-10-CM | POA: Diagnosis not present

## 2021-09-03 DIAGNOSIS — Z20822 Contact with and (suspected) exposure to covid-19: Secondary | ICD-10-CM | POA: Diagnosis not present

## 2021-09-07 DIAGNOSIS — E782 Mixed hyperlipidemia: Secondary | ICD-10-CM | POA: Diagnosis not present

## 2021-09-07 DIAGNOSIS — Z20822 Contact with and (suspected) exposure to covid-19: Secondary | ICD-10-CM | POA: Diagnosis not present

## 2021-09-07 DIAGNOSIS — K219 Gastro-esophageal reflux disease without esophagitis: Secondary | ICD-10-CM | POA: Diagnosis not present

## 2021-09-17 ENCOUNTER — Ambulatory Visit (INDEPENDENT_AMBULATORY_CARE_PROVIDER_SITE_OTHER): Payer: Medicare Other | Admitting: Gastroenterology

## 2021-09-17 ENCOUNTER — Other Ambulatory Visit (INDEPENDENT_AMBULATORY_CARE_PROVIDER_SITE_OTHER): Payer: Self-pay | Admitting: Gastroenterology

## 2021-09-17 ENCOUNTER — Telehealth (INDEPENDENT_AMBULATORY_CARE_PROVIDER_SITE_OTHER): Payer: Self-pay

## 2021-09-17 ENCOUNTER — Encounter (INDEPENDENT_AMBULATORY_CARE_PROVIDER_SITE_OTHER): Payer: Self-pay | Admitting: Gastroenterology

## 2021-09-17 VITALS — BP 127/82 | HR 71 | Temp 97.7°F | Ht 75.0 in | Wt 196.3 lb

## 2021-09-17 DIAGNOSIS — R110 Nausea: Secondary | ICD-10-CM | POA: Diagnosis not present

## 2021-09-17 DIAGNOSIS — K581 Irritable bowel syndrome with constipation: Secondary | ICD-10-CM

## 2021-09-17 DIAGNOSIS — K5732 Diverticulitis of large intestine without perforation or abscess without bleeding: Secondary | ICD-10-CM | POA: Diagnosis not present

## 2021-09-17 DIAGNOSIS — R634 Abnormal weight loss: Secondary | ICD-10-CM | POA: Diagnosis not present

## 2021-09-17 DIAGNOSIS — R6881 Early satiety: Secondary | ICD-10-CM

## 2021-09-17 MED ORDER — LINACLOTIDE 72 MCG PO CAPS: 72.0000 ug | ORAL_CAPSULE | Freq: Every day | ORAL | 1 refills | Status: DC

## 2021-09-17 MED ORDER — LUBIPROSTONE 24 MCG PO CAPS: 24.0000 ug | ORAL_CAPSULE | Freq: Two times a day (BID) | ORAL | 3 refills | Status: DC

## 2021-09-17 MED ORDER — ONDANSETRON HCL 4 MG PO TABS: 4.0000 mg | ORAL_TABLET | Freq: Three times a day (TID) | ORAL | 0 refills | Status: DC | PRN

## 2021-09-17 NOTE — Telephone Encounter (Signed)
Linzess too expensive per patient # 30 day supply over $300.00, would like an alternative or should he continue the Miralax? Please advise ?

## 2021-09-17 NOTE — Telephone Encounter (Signed)
Will send Amitiza BID but seems most of the laxatives are not covered ?

## 2021-09-17 NOTE — Patient Instructions (Addendum)
-   Explained presumed etiology of IBS symptoms. Patient was counseled about the benefit of implementing a low FODMAP to improve symptoms and recurrent episodes. A dietary list was provided to the patient. Also, the patient was counseled about the benefit of avoiding stressing situations and potential environmental triggers leading to symptomatology. ?- Continue Zofran as needed for nausea ?- Start Linzess 72 mcg qday ?- Stop Miralax when starting Linzess ?

## 2021-09-17 NOTE — Progress Notes (Signed)
Maylon Peppers, M.D. ?Gastroenterology & Hepatology ?[facility] ?[address] ?[place], [place] [address] ? ?Primary Care Physician: ?[name], MD ?[address] ?[place] [address] ? ?I will communicate my assessment and recommendations to the referring MD via EMR. ? ?Problems: ?IBS-C ?GERD ?Diverticulitis ? ?History of Present Illness: ?Frank Weber is a 71 y.o. male with past medical history of IBS-C, GERD, hypothyroidism, hyperlipidemia, peripheral neuropathy, who presents for follow up of IBS-C and nausea. ? ?The patient was last seen on 05/10/2021. At that time, the patient was advised to increase the MiraLAX intake to improve his bowel movement frequency. ? ?Patient went to the ER on 08/20/2021, was found to have uncomplicated diverticulitis for which he was given prescription of ciprofloxacin and metronidazole.  He underwent a CT of the abdomen and pelvis with IV contrast at the ER at [facility] which only showed these inflammation but no other abnormalities.  However, he developed tingling in his torso with antibiotic, and was advised to stop the antibiotic in urgent care after taking the medication for 6 days.  States that after this, he is tingling resolved and he has mild pain was feeling better than prior. ? ?Patient states that he has been drinking more water than usual, also has been drinking coffee and prunes frequently. He is taking Miralax couple of times a day compliantly. He reports that he is having a few bowel movements  per day which is liquidy and soft in consistency. He reports he tried to take Miralax 3 times a day but could not tolerate it as it leads to nausea. ? ?Also reports that he has presented nausea frequently but no vomiting. Has been taking Zofran for nausea which helps. ? ?He also reports that he has presented persistent pain in his left sided abdominal area with some tenderness episodes. He also reports  bloating frequently after having a meal. ? ?The patient denies having any nausea, vomiting, fever, chills, hematochezia, melena, hematemesis, abdominal distention,diarrhea, jaundice, pruritus . Has been losing weight recently due to unclear causes, feels his appetite has decreased. States he lost 80 lb in the last 5 years. He is not following any diet. ? ?Last EGD 01/2021 ?- Salmon-colored mucosa suspicious for short-segment Barrett's esophagus. Biopsied - reflux changes. ?- Erythematous mucosa in the antrum. Biopsied - reactive gastropathy neg for HP. ?- Normal examined duodenum. ? ?Last Colonoscopy: 01/2021 ?- Two 2 mm polyps in the ascending colon, removed with a cold biopsy forceps. Resected and retrieved. Path TA x2. ?- Nine 3 to 8 mm polyps in the descending colon, in the transverse colon and in the ascending colon, removed with a cold snare. Resected and retrieved. Path 8/9 tubular adenomas ?- Diverticulosis in the sigmoid colon and in the descending colon. ?- Non-bleeding internal hemorrhoids. ?  ?Repeat colonoscopy in 2 years. ? ?Past Medical History: ?Past Medical History:  ?Diagnosis Date  ? Arthritis   ? right knee  ? Constipation   ? Frequency of urination   ? GERD (gastroesophageal reflux disease)   ? Heart murmur   ? "slight"   ? History of kidney stones   ? History of melanoma excision   ? 2012--  NECK/ HEAD  ? History of squamous cell carcinoma excision   ? RIGHT EAR  ? Hyperlipidemia   ? Hypothyroidism   ? IBS (irritable bowel syndrome)   ? states slight  ? Lumbar stenosis   ? L5 - S1  ? Migraines   ?  Peripheral neuropathy   ? legs and feet  ? Right flank pain   ? Right ureteral stone   ? Urgency of urination   ? Wears glasses   ? ? ?Past Surgical History: ?Past Surgical History:  ?Procedure Laterality Date  ? BIOPSY  01/10/2021  ? Procedure: BIOPSY;  Surgeon: Montez Morita, Quillian Quince, MD;  Location: [facility];  Service: Gastroenterology;;  ? CARDIAC CATHETERIZATION  07-08-2007  dr Tami Ribas   ? no  significant CAD, preserved LVF, ef 50%//  30% pLAD  ? CARDIOVASCULAR STRESS TEST  06-20-2011  dr croitoru  ? Low risk study/  normal perfusion scan showing attenuation artifact in the anterior region of myocardium with no ischemia , infarct or scar/  normal LV funciton and wall motion , ef 62%  ? COLONOSCOPY N/A 02/17/2013  ? Rehman:examination performed to cecum. mild sigmoid colon diverticulosis, 4 small polyps ablated via cold biopsy from rectosigmoid junction (hyperplastic). External hemorrhoids.  ? COLONOSCOPY WITH PROPOFOL N/A 01/10/2021  ? Procedure: COLONOSCOPY WITH PROPOFOL;  Surgeon: [name], MD;  [facility]: [facility];  Service: Gastroenterology;  Laterality: N/A;  ? CYSTOSCOPY W/ URETERAL STENT PLACEMENT Right 06/18/2015  ? Procedure: CYSTOSCOPY WITH RIGHT RETROGRADE PYELOGRAM RIGHT URETERAL STENT PLACEMENT;  Surgeon: [name], MD;  Location: [facility];  Service: Urology;  Laterality: Right;  ? CYSTOSCOPY/URETEROSCOPY/HOLMIUM LASER/STENT PLACEMENT Right 06/27/2015  ? Procedure: CYSTOSCOPY RIGHT URETEROSCOPY  STENT REMOVAL; STONE EXTRACTION WITH BASKET;  Surgeon: [name], MD;  Location[facility];  Service: Urology;  Laterality: Right;  ? ESOPHAGOGASTRODUODENOSCOPY (EGD) WITH PROPOFOL N/A 01/10/2021  ? Procedure: ESOPHAGOGASTRODUODENOSCOPY (EGD) WITH PROPOFOL;  Surgeon: [name], MD;  [facility];  Service: Gastroenterology;  Laterality: N/A;  11:00, moved up per [name] - pt knows arrival time  ? HEAD & NECK SKIN LESION EXCISIONAL BIOPSY    ? HOLMIUM LASER APPLICATION Right 91/63/8466  ? Procedure: HOLMIUM LASER LITHOTRIPSY ;  Surgeon: [name], MD;  Location: [facility];  Service: Urology;  Laterality: Right;  ? I & D EXTREMITY Right 07/14/2018  ? Procedure: IRRIGATION AND DEBRIDEMENT RIGHT KNEE WITH CLOSURE;  Surgeon: Nicholes Stairs, MD;  Location: [facility];  Service: Orthopedics;  Laterality: Right;  ? INGUINAL  HERNIA REPAIR Left 10/21/2007  ? KNEE ARTHROSCOPY W/ ACL RECONSTRUCTION Right 1992  ? MENISCUS REPAIR Right   ? 2019, dr Theda Sers   ? POLYPECTOMY  01/10/2021  ? Procedure: POLYPECTOMY;  Surgeon: Montez Morita, Quillian Quince, MD;  [facility];  Service: Gastroenterology;;  ? SHOULDER ARTHROSCOPY WITH OPEN ROTATOR CUFF REPAIR Left 07/27/2014  ? Procedure: LEFT SHOULDER ARTHROSCOPY WITH MINI OPEN ROTATOR CUFF REPAIR;  Surgeon: [name], MD;  Location: [facility];  Service: Orthopedics;  Laterality: Left;  ? SHOULDER ARTHROSCOPY WITH OPEN ROTATOR CUFF REPAIR Right 2006  ? TONSILLECTOMY AND ADENOIDECTOMY  age 39  ? TOTAL KNEE ARTHROPLASTY Right 07/10/2018  ? Procedure: TOTAL KNEE ARTHROPLASTY;  Surgeon: [name], MD;  Location: [facility];  Service: Orthopedics;  Laterality: Right;  ? TRANSTHORACIC ECHOCARDIOGRAM  07/30/2007  ? normal LVF, ef >55%/  mild AR, MR , PR and TR/  mild AV sclerosis without stenosis/   ? URETEROSCOPY Right 06/18/2015  ? Procedure: URETEROSCOPY;  Surgeon: [name], MD;  Location: [facility];  Service: Urology;  Laterality: Right;  ? ? ?Family History: ?Family History  ?Problem Relation Age of Onset  ? Colon cancer Other   ?  Age of Onset-late 39's  ? ? ?Social History: ?Social History  ? ?Tobacco Use  ?Smoking Status Former  ? Packs/day: 0.00  ? Years: 30.00  ? Pack years: 0.00  ? Types: Cigars, Cigarettes  ?Smokeless Tobacco Never  ? ?Social History  ? ?Substance and Sexual Activity  ?Alcohol Use Yes  ? Comment: seldom   ? ?Social History  ? ?Substance and Sexual Activity  ?Drug Use No  ? ? ?Allergies: ?Allergies  ?Allergen Reactions  ? Sulfa Antibiotics Anaphylaxis, Shortness Of Breath and Rash  ? Coconut Oil Rash  ? ? ?Medications: ?Current Outpatient Medications  ?Medication Sig Dispense Refill  ? ALPRAZOLAM XR 1 MG 24 hr tablet Take 1 mg by mouth at bedtime.    ? aspirin EC 81 MG tablet Take 1 tablet (81 mg total) by mouth daily. Swallow whole. 90 tablet 3  ?  atorvastatin (LIPITOR) 20 MG tablet Take 20 mg by mouth daily.    ? calcium elemental as carbonate (TUMS ULTRA 1000) 400 MG chewable tablet Chew 2,000 mg by mouth daily as needed for heartburn.    ? Cholecal

## 2021-09-18 ENCOUNTER — Telehealth (INDEPENDENT_AMBULATORY_CARE_PROVIDER_SITE_OTHER): Payer: Self-pay

## 2021-09-18 NOTE — Telephone Encounter (Signed)
Patient approved for the Amitiza, Lubiprostone bid According to [employer] Covered from 06/10/2021-09/18/2022. I have called and left the patient a message to call me back to let me know he received the message. ?

## 2021-09-19 NOTE — Telephone Encounter (Signed)
I called and left a message asked that patient please call me with his determination on whether he wants [place] to send Trulance in.  ?

## 2021-09-19 NOTE — Telephone Encounter (Signed)
No patient says he checked with his pharmacist and he will not be able to afford this either. He says he will try and manage with diet and miralax. Is there a certain way he should use the miralax? Please advise. ?

## 2021-09-19 NOTE — Telephone Encounter (Signed)
We can try Trulance, but per Epic nothing is really covered by his insurance. Please ask him and I'll send it to the pharmacy. ?

## 2021-09-19 NOTE — Telephone Encounter (Signed)
Called and discussed with patient per Dr. Jenetta Downer - He can try take one capful three times a day mixed in a glass of water. If not convenient, he can take the three glasses in the morning instead. ?Patient verbalized understanding.  ?

## 2021-09-19 NOTE — Telephone Encounter (Signed)
Per Patient pharmacy the Lubiprostone cost with insurance is $504.04. I have called the patient and left him a detailed message regarding the cost. I advised that he please give me a call back to confirm he is unable to afford and wants me to ask Dr. Jenetta Downer for a alternative or go back on Miralax. ?

## 2021-09-19 NOTE — Telephone Encounter (Signed)
Please advise patient can not afford neither Linzess nor Lubiprostone. Should he just use Miralax?  ?

## 2021-09-19 NOTE — Telephone Encounter (Signed)
He can try take one capful three times a day mixed in a glass of water. If not convenient, he can take the three glasses in the morning instead ?

## 2021-09-24 ENCOUNTER — Ambulatory Visit (INDEPENDENT_AMBULATORY_CARE_PROVIDER_SITE_OTHER): Payer: Medicare Other | Admitting: Gastroenterology

## 2021-09-24 DIAGNOSIS — Z20822 Contact with and (suspected) exposure to covid-19: Secondary | ICD-10-CM | POA: Diagnosis not present

## 2021-10-04 DIAGNOSIS — Z20822 Contact with and (suspected) exposure to covid-19: Secondary | ICD-10-CM | POA: Diagnosis not present

## 2021-10-09 DIAGNOSIS — Z20822 Contact with and (suspected) exposure to covid-19: Secondary | ICD-10-CM | POA: Diagnosis not present

## 2021-10-15 DIAGNOSIS — Z23 Encounter for immunization: Secondary | ICD-10-CM | POA: Diagnosis not present

## 2021-10-15 DIAGNOSIS — Z20822 Contact with and (suspected) exposure to covid-19: Secondary | ICD-10-CM | POA: Diagnosis not present

## 2021-12-03 DIAGNOSIS — G629 Polyneuropathy, unspecified: Secondary | ICD-10-CM | POA: Diagnosis not present

## 2021-12-03 DIAGNOSIS — G608 Other hereditary and idiopathic neuropathies: Secondary | ICD-10-CM | POA: Diagnosis not present

## 2021-12-03 DIAGNOSIS — Z79899 Other long term (current) drug therapy: Secondary | ICD-10-CM | POA: Diagnosis not present

## 2021-12-24 DIAGNOSIS — Z6824 Body mass index (BMI) 24.0-24.9, adult: Secondary | ICD-10-CM | POA: Diagnosis not present

## 2021-12-24 DIAGNOSIS — F419 Anxiety disorder, unspecified: Secondary | ICD-10-CM | POA: Diagnosis not present

## 2022-02-15 ENCOUNTER — Other Ambulatory Visit: Payer: Self-pay | Admitting: Internal Medicine

## 2022-02-15 ENCOUNTER — Other Ambulatory Visit (HOSPITAL_COMMUNITY): Payer: Self-pay | Admitting: Internal Medicine

## 2022-02-15 DIAGNOSIS — R634 Abnormal weight loss: Secondary | ICD-10-CM

## 2022-02-15 DIAGNOSIS — K219 Gastro-esophageal reflux disease without esophagitis: Secondary | ICD-10-CM | POA: Diagnosis not present

## 2022-02-15 DIAGNOSIS — R1084 Generalized abdominal pain: Secondary | ICD-10-CM

## 2022-02-15 DIAGNOSIS — E559 Vitamin D deficiency, unspecified: Secondary | ICD-10-CM | POA: Diagnosis not present

## 2022-02-15 DIAGNOSIS — D518 Other vitamin B12 deficiency anemias: Secondary | ICD-10-CM | POA: Diagnosis not present

## 2022-02-15 DIAGNOSIS — Z6823 Body mass index (BMI) 23.0-23.9, adult: Secondary | ICD-10-CM | POA: Diagnosis not present

## 2022-02-15 DIAGNOSIS — E039 Hypothyroidism, unspecified: Secondary | ICD-10-CM | POA: Diagnosis not present

## 2022-02-15 DIAGNOSIS — Z1331 Encounter for screening for depression: Secondary | ICD-10-CM | POA: Diagnosis not present

## 2022-02-15 DIAGNOSIS — Z0001 Encounter for general adult medical examination with abnormal findings: Secondary | ICD-10-CM | POA: Diagnosis not present

## 2022-02-15 DIAGNOSIS — Z125 Encounter for screening for malignant neoplasm of prostate: Secondary | ICD-10-CM | POA: Diagnosis not present

## 2022-02-22 DIAGNOSIS — R1030 Lower abdominal pain, unspecified: Secondary | ICD-10-CM | POA: Diagnosis not present

## 2022-02-22 DIAGNOSIS — M461 Sacroiliitis, not elsewhere classified: Secondary | ICD-10-CM | POA: Diagnosis not present

## 2022-02-22 DIAGNOSIS — Z6823 Body mass index (BMI) 23.0-23.9, adult: Secondary | ICD-10-CM | POA: Diagnosis not present

## 2022-02-22 DIAGNOSIS — M5416 Radiculopathy, lumbar region: Secondary | ICD-10-CM | POA: Diagnosis not present

## 2022-02-27 ENCOUNTER — Ambulatory Visit (HOSPITAL_COMMUNITY)
Admission: RE | Admit: 2022-02-27 | Discharge: 2022-02-27 | Disposition: A | Discharge status: Home / Self Care | Payer: Medicare Other | Source: Ambulatory Visit | Attending: Internal Medicine | Admitting: Internal Medicine

## 2022-02-27 ENCOUNTER — Encounter (HOSPITAL_COMMUNITY): Payer: Self-pay

## 2022-02-27 DIAGNOSIS — J439 Emphysema, unspecified: Secondary | ICD-10-CM | POA: Diagnosis not present

## 2022-02-27 DIAGNOSIS — R1084 Generalized abdominal pain: Secondary | ICD-10-CM | POA: Diagnosis not present

## 2022-02-27 DIAGNOSIS — K5730 Diverticulosis of large intestine without perforation or abscess without bleeding: Secondary | ICD-10-CM | POA: Diagnosis not present

## 2022-02-27 DIAGNOSIS — R634 Abnormal weight loss: Secondary | ICD-10-CM | POA: Diagnosis not present

## 2022-02-27 DIAGNOSIS — R918 Other nonspecific abnormal finding of lung field: Secondary | ICD-10-CM | POA: Diagnosis not present

## 2022-02-27 DIAGNOSIS — N281 Cyst of kidney, acquired: Secondary | ICD-10-CM | POA: Diagnosis not present

## 2022-02-27 DIAGNOSIS — R079 Chest pain, unspecified: Secondary | ICD-10-CM | POA: Diagnosis not present

## 2022-02-27 MED ORDER — IOHEXOL 300 MG/ML  SOLN
100.0000 mL | Freq: Once | INTRAMUSCULAR | Status: AC | PRN
  Administered 2022-02-27: 100 mL via INTRAVENOUS

## 2022-02-27 MED ORDER — SODIUM CHLORIDE (PF) 0.9 % IJ SOLN
INTRAMUSCULAR | Status: AC
  Filled 2022-02-27: qty 50

## 2022-03-06 ENCOUNTER — Encounter (INDEPENDENT_AMBULATORY_CARE_PROVIDER_SITE_OTHER): Payer: Self-pay | Admitting: Gastroenterology

## 2022-03-07 DIAGNOSIS — M25551 Pain in right hip: Secondary | ICD-10-CM | POA: Diagnosis not present

## 2022-03-18 DIAGNOSIS — Z85828 Personal history of other malignant neoplasm of skin: Secondary | ICD-10-CM | POA: Diagnosis not present

## 2022-03-18 DIAGNOSIS — L820 Inflamed seborrheic keratosis: Secondary | ICD-10-CM | POA: Diagnosis not present

## 2022-03-21 ENCOUNTER — Encounter (INDEPENDENT_AMBULATORY_CARE_PROVIDER_SITE_OTHER): Payer: Self-pay | Admitting: Gastroenterology

## 2022-03-21 ENCOUNTER — Ambulatory Visit (INDEPENDENT_AMBULATORY_CARE_PROVIDER_SITE_OTHER): Payer: Medicare Other | Admitting: Gastroenterology

## 2022-03-21 VITALS — BP 130/78 | HR 62 | Temp 97.9°F | Ht 75.0 in | Wt 186.0 lb

## 2022-03-21 DIAGNOSIS — M5416 Radiculopathy, lumbar region: Secondary | ICD-10-CM | POA: Diagnosis not present

## 2022-03-21 DIAGNOSIS — R634 Abnormal weight loss: Secondary | ICD-10-CM

## 2022-03-21 DIAGNOSIS — M5459 Other low back pain: Secondary | ICD-10-CM | POA: Diagnosis not present

## 2022-03-21 DIAGNOSIS — R110 Nausea: Secondary | ICD-10-CM

## 2022-03-21 DIAGNOSIS — M48062 Spinal stenosis, lumbar region with neurogenic claudication: Secondary | ICD-10-CM | POA: Diagnosis not present

## 2022-03-21 MED ORDER — ONDANSETRON HCL 4 MG PO TABS: 4.0000 mg | ORAL_TABLET | Freq: Three times a day (TID) | ORAL | 0 refills | Status: DC | PRN

## 2022-03-21 NOTE — Patient Instructions (Signed)
I am providing samples of linzess 31mg once daily, we will try to obtain patient assistance and go from there to see if we can get this covered for you. As discussed this may cause some looser stools, as long as you are not having more than 4 looser stools per day, this is okay. Please continue with good water intake and high fiber diet. You can increase protein shakes to three times per day, liberalize diet and eat anything that you'd like.  Follow up 3 months

## 2022-03-21 NOTE — Progress Notes (Signed)
Referring Provider: [name], MD Primary Care Physician:  [name], MD Primary GI Physician: Jenetta Downer   Chief Complaint  Patient presents with   Follow-up    Patient here today due to having issues with constipation. He is taking 17 g bid. He is no longer using Amitiza.    HPI:   Frank Weber is a 71 y.o. male with past medical history of IBS-C, GERD, hypothyroidism, hyperlipidemia, peripheral neuropathy, who presents for follow up of IBS-C and nausea.  Patient presenting today for constipation.   At last visit in April, drinking more water, coffee, and eating prunes. Taking miralax BID, having a few BMs per day which were liquidy and soft. Tried miralax TID but this caused nausea. Also with frequent nausea and no vomiting, taking zofran which helps. Also with persistent left abdominal pain and bloating after meals.   Recommended to start linzess 50mg daily, stop miralax. Linzess was not covered by insurance and other meds (trulance and amitiza) were not affordable. He was advised to do Miralax one capful TID.  Present:  Reports about 20 pounds over the past 2-3 months. He previously smoked cigarettes but now does not, smokes cigars on occasion. He has had a lot of dental issues and has issues with eating and states that he does not really feel like eating as much. He is trying to do boost/ensure as well. Continues with constipation. He was doing miralax BID, TID which caused him to have diarrhea. He notes due to dental issues, he has had to take a few courses of antibiotics which caused him to have diarrhea, he stopped taking miralax at that time. Finished abx about 1 week ago and began having constipation again. He did take laxative a few nights ago and had some hard stool balls. Is still having to strain quite a bit and having very  hard stools. He notes that he drinks a lot of coffee, probably not as much water as he should, maybe 24-32oz per day. Denies abdominal pain.  Has some nausea, but no vomiting.   PCP did do CT chest and abdomen in September due to weight loss and generally feeling unwell. CT A/P showed moderate stool burden in colon. Last TSH in chart review in August 2022 was WNL.    Patient denies melena, hematochezia, nausea, diarrhea, dysphagia, odyonophagia, early satiety.   Last EGD 01/2021 - Salmon-colored mucosa suspicious for short-segment Barrett's esophagus. Biopsied - reflux changes. - Erythematous mucosa in the antrum. Biopsied - reactive gastropathy neg for HP. - Normal examined duodenum.  Last Colonoscopy: 01/2021 - Two 2 mm polyps in the ascending colon, removed with a cold biopsy forceps. Resected and retrieved. Path TA x2. - Nine 3 to 8 mm polyps in the descending colon, in the transverse colon and in the ascending colon, removed with a cold snare. Resected and retrieved. Path 8/9 tubular adenomas - Diverticulosis in the sigmoid colon and in the descending colon. - Non-bleeding internal hemorrhoids.   Repeat colonoscopy in 2 years.  Past Medical History:  Diagnosis Date   Arthritis    right knee   Constipation    Frequency of urination    GERD (gastroesophageal reflux disease)    Heart murmur    "slight"    History of kidney stones    History of melanoma excision    2012--  NECK/ HEAD   History of squamous cell carcinoma excision    RIGHT EAR   Hyperlipidemia    Hypothyroidism  IBS (irritable bowel syndrome)    states slight   Lumbar stenosis    L5 - S1   Migraines    Peripheral neuropathy    legs and feet   Right flank pain    Right ureteral stone    Urgency of urination    Wears glasses     Past Surgical History:  Procedure Laterality Date   BIOPSY  01/10/2021   Procedure: BIOPSY;  Surgeon: Montez Morita, Quillian Quince, MD;  [facility]: [facility];  Service: Gastroenterology;;   CARDIAC CATHETERIZATION  07-08-2007  dr Tami Ribas    no significant CAD, preserved LVF, ef 50%//  30% pLAD   CARDIOVASCULAR STRESS  TEST  06-20-2011  dr croitoru   Low risk study/  normal perfusion scan showing attenuation artifact in the anterior region of myocardium with no ischemia , infarct or scar/  normal LV funciton and wall motion , ef 62%   COLONOSCOPY N/A 02/17/2013   Rehman:examination performed to cecum. mild sigmoid colon diverticulosis, 4 small polyps ablated via cold biopsy from rectosigmoid junction (hyperplastic). External hemorrhoids.   COLONOSCOPY WITH PROPOFOL N/A 01/10/2021   Procedure: COLONOSCOPY WITH PROPOFOL;  Surgeon: [name], MD;  [facility]: [facility];  Service: Gastroenterology;  Laterality: N/A;   CYSTOSCOPY W/ URETERAL STENT PLACEMENT Right 06/18/2015   Procedure: CYSTOSCOPY WITH RIGHT RETROGRADE PYELOGRAM RIGHT URETERAL STENT PLACEMENT;  Surgeon: [name], MD;  Location: [facility];  Service: Urology;  Laterality: Right;   CYSTOSCOPY/URETEROSCOPY/HOLMIUM LASER/STENT PLACEMENT Right 06/27/2015   Procedure: CYSTOSCOPY RIGHT URETEROSCOPY  STENT REMOVAL; STONE EXTRACTION WITH BASKET;  Surgeon: [name], MD;  Location: [facility];  Service: Urology;  Laterality: Right;   ESOPHAGOGASTRODUODENOSCOPY (EGD) WITH PROPOFOL N/A 01/10/2021   Procedure: ESOPHAGOGASTRODUODENOSCOPY (EGD) WITH PROPOFOL;  Surgeon: [name], MD;  [facility];  Service: Gastroenterology;  Laterality: N/A;  11:00, moved up per [name] - pt knows arrival time   [place] EXCISIONAL BIOPSY     HOLMIUM LASER APPLICATION Right 37/16/9678   Procedure: HOLMIUM LASER LITHOTRIPSY ;  Surgeon: [name], MD;  [facility];  Service: Urology;  Laterality: Right;   I & D EXTREMITY Right 07/14/2018   Procedure: IRRIGATION AND DEBRIDEMENT RIGHT KNEE WITH CLOSURE;  Surgeon: Nicholes Stairs, MD;  Location: [place];  Service: Orthopedics;  Laterality: Right;   INGUINAL HERNIA REPAIR Left 10/21/2007   KNEE ARTHROSCOPY W/ ACL RECONSTRUCTION Right  1992   MENISCUS REPAIR Right    2019, dr Theda Sers    POLYPECTOMY  01/10/2021   Procedure: POLYPECTOMY;  Surgeon: [name], MD;  [facility];  Service: Gastroenterology;;   SHOULDER ARTHROSCOPY WITH OPEN ROTATOR CUFF REPAIR Left 07/27/2014   Procedure: LEFT SHOULDER ARTHROSCOPY WITH MINI OPEN ROTATOR CUFF REPAIR;  Surgeon: [name], MD;  Location: [place];  Service: Orthopedics;  Laterality: Left;   SHOULDER ARTHROSCOPY WITH OPEN ROTATOR CUFF REPAIR Right 2006   TONSILLECTOMY AND ADENOIDECTOMY  age 20   TOTAL KNEE ARTHROPLASTY Right 07/10/2018   Procedure: TOTAL KNEE ARTHROPLASTY;  Surgeon: [name], MD;  Location: [facility];  Service: Orthopedics;  Laterality: Right;   TRANSTHORACIC ECHOCARDIOGRAM  07/30/2007   normal LVF, ef >55%/  mild AR, MR , PR and TR/  mild AV sclerosis without stenosis/    URETEROSCOPY Right 06/18/2015   Procedure: URETEROSCOPY;  Surgeon: [name], MD;  Location: [facility];  Service: Urology;  Laterality: Right;  Current Outpatient Medications  Medication Sig Dispense Refill   ALPRAZOLAM XR 1 MG 24 hr tablet Take 1 mg by mouth at bedtime.     aspirin EC 81 MG tablet Take 1 tablet (81 mg total) by mouth daily. Swallow whole. 90 tablet 3   atorvastatin (LIPITOR) 20 MG tablet Take 20 mg by mouth daily.     calcium elemental as carbonate (TUMS ULTRA 1000) 400 MG chewable tablet Chew 2,000 mg by mouth daily as needed for heartburn.     Cholecalciferol (DIALYVITE VITAMIN D 5000) 125 MCG (5000 UT) capsule Take 5,000 Units by mouth daily.     cyclobenzaprine (FLEXERIL) 5 MG tablet Take 1 tablet (5 mg total) by mouth 3 (three) times daily as needed for muscle spasms. 21 tablet 0   gabapentin (NEURONTIN) 300 MG capsule Take 900 mg by mouth 4 (four) times daily.     levothyroxine (SYNTHROID, LEVOTHROID) 25 MCG tablet Take 25 mcg by mouth daily before breakfast.     LORazepam (ATIVAN) 0.5 MG tablet Take 0.5-1 mg by mouth  daily as needed for anxiety.     lubiprostone (AMITIZA) 24 MCG capsule Take 1 capsule (24 mcg total) by mouth 2 (two) times daily with a meal. 180 capsule 3   nitroGLYCERIN (NITROSTAT) 0.4 MG SL tablet Place 1 tablet (0.4 mg total) under the tongue every 5 (five) minutes as needed. 25 tablet 3   ondansetron (ZOFRAN) 4 MG tablet Take 1 tablet (4 mg total) by mouth every 8 (eight) hours as needed for nausea or vomiting. 90 tablet 0   Polyethyl Glycol-Propyl Glycol (SYSTANE ULTRA) 0.4-0.3 % SOLN Place 2 drops into both eyes as needed (for dry eyes).      Probiotic Product (TRUBIOTICS PO) Take 1 capsule by mouth at bedtime.     rizatriptan (MAXALT-MLT) 10 MG disintegrating tablet Take 10 mg by mouth as needed (for migraines and may repeat once in 2 hours, if no relief).      vitamin k 100 MCG tablet Take 100 mcg by mouth daily.     No current facility-administered medications for this visit.    Allergies as of 03/21/2022 - Review Complete 03/21/2022  Allergen Reaction Noted   Sulfa antibiotics Anaphylaxis, Shortness Of Breath, and Rash 07/20/2014   Coconut (cocos nucifera) Rash 07/20/2014    Family History  Problem Relation Age of Onset   Colon cancer Other        Age of Onset-late 43's    Social History   Socioeconomic History   Marital status: Married    Spouse name: Not on file   Number of children: Not on file   Years of education: Not on file   Highest education level: Not on file  Occupational History   Not on file  Tobacco Use   Smoking status: Former    Packs/day: 0.00    Years: 30.00    Total pack years: 0.00    Types: Cigars, Cigarettes   Smokeless tobacco: Never  Vaping Use   Vaping Use: Never used  Substance and Sexual Activity   Alcohol use: Yes    Comment: seldom    Drug use: No   Sexual activity: Not on file  Other Topics Concern   Not on file  Social History Narrative   Not on file   Social Determinants of Health   Financial Resource Strain: Not on  file  Food Insecurity: Not on file  Transportation Needs: Not on file  Physical Activity: Not on file  Stress: Not on file  Social Connections: Not on file   Review of systems General: negative for malaise, night sweats, fever, chills +weight loss Neck: Negative for lumps, goiter, pain and significant neck swelling Resp: Negative for cough, wheezing, dyspnea at rest CV: Negative for chest pain, leg swelling, palpitations, orthopnea GI: denies melena, hematochezia, nausea, vomiting, diarrhea, dysphagia, odyonophagia, early satiety +constipation +weight loss MSK: Negative for joint pain or swelling, back pain, and muscle pain. Derm: Negative for itching or rash Psych: Denies depression, anxiety, memory loss, confusion. No homicidal or suicidal ideation.  Heme: Negative for prolonged bleeding, bruising easily, and swollen nodes. Endocrine: Negative for cold or heat intolerance, polyuria, polydipsia and goiter. Neuro: negative for tremor, gait imbalance, syncope and seizures. The remainder of the review of systems is noncontributory.  Physical Exam: BP 130/78 (BP Location: Left Arm, Patient Position: Sitting, Cuff Size: Small)   Pulse 62   Temp 97.9 F (36.6 C) (Oral)   Ht '6\' 3"'$  (1.905 m)   Wt 186 lb (84.4 kg)   BMI 23.25 kg/m  General:   Alert and oriented. No distress noted. Pleasant and cooperative.  Head:  Normocephalic and atraumatic. Eyes:  Conjuctiva clear without scleral icterus. Mouth:  Oral mucosa pink and moist. Good dentition. No lesions. Heart: Normal rate and rhythm, s1 and s2 heart sounds present.  Lungs: Clear lung sounds in all lobes. Respirations equal and unlabored. Abdomen:  +BS, soft, non-tender and non-distended. No rebound or guarding. No HSM or masses noted. Derm: No palmar erythema or jaundice Msk:  Symmetrical without gross deformities. Normal posture. Extremities:  Without edema. Neurologic:  Alert and  oriented x4 Psych:  Alert and cooperative. Normal  mood and affect.  Invalid input(s): "6 MONTHS"   ASSESSMENT: DEJEAN TRIBBY is a 71 y.o. male presenting today for ongoing constipation and weight loss.  Patient continues with constipation despite Miralax BID, did not tolerate TID dosing. Linzess, trulance and amitiza all not covered by patients insurance. Recent CT A/P/C with contrast in September without abnormalities to explain weight loss but with moderate stool burden. Does note some recent dental issues which could also be contributing to his weight loss. Had recent colonoscopy and EGD in august 2022 with a few polyps, reactive gastropathy, diverticulosis and hemorrhoids. Given constipation is still ongoing despite miralax, he ultimately needs to be on prescription strength treatment to manage his bowels. Will provide linzess 90mg samples and start patient assistance process to see if patient qualifies for this. In the meantime he should increase water intake, eat a diet high in fiber, liberalize diet to maintain weight and can increase protein shakes to TID for supplemental nutrition.   PLAN:  Linzess 72 mcg samples, stop miralax 2. Complete Patient assistance for linzess  3. Increase water intake  4. Continue probiotic 5. High fiber diet 6. Liberalize diet and increase protein shakes to TID  All questions were answered, patient verbalized understanding and is in agreement with plan as outlined above.   Follow Up: 3 months  Frank Novelo L. CAlver Sorrow MSN, APRN, AGNP-C Adult-Gerontology Nurse Practitioner [facility]  I have reviewed the note and agree with the APP's assessment as described in this progress note  DMaylon Peppers MD Gastroenterology and Hepatology CMon[facility] LLCGastroenterology

## 2022-03-29 DIAGNOSIS — M545 Low back pain, unspecified: Secondary | ICD-10-CM | POA: Diagnosis not present

## 2022-04-01 ENCOUNTER — Telehealth (INDEPENDENT_AMBULATORY_CARE_PROVIDER_SITE_OTHER): Payer: Self-pay

## 2022-04-01 NOTE — Telephone Encounter (Signed)
Application placed on Scherrie Gerlach, NP desk.   Patient brought by pap application from [employer]  for [place]. Patient was told to start 15 mcg,but this was too expensive. Per [facility] put 695 mcg on the application as the patient will need a higher dose than the 72 mcg.

## 2022-04-01 NOTE — Telephone Encounter (Signed)
I have faxed the signed application and the patient was made aware. He is also aware to increase the medication to two of the 72 mcg per day for now.

## 2022-04-03 ENCOUNTER — Telehealth (INDEPENDENT_AMBULATORY_CARE_PROVIDER_SITE_OTHER): Payer: Medicare Other | Admitting: Gastroenterology

## 2022-04-03 DIAGNOSIS — K5900 Constipation, unspecified: Secondary | ICD-10-CM

## 2022-04-04 DIAGNOSIS — M545 Low back pain, unspecified: Secondary | ICD-10-CM | POA: Diagnosis not present

## 2022-04-05 ENCOUNTER — Other Ambulatory Visit (INDEPENDENT_AMBULATORY_CARE_PROVIDER_SITE_OTHER): Payer: Self-pay | Admitting: Gastroenterology

## 2022-04-05 ENCOUNTER — Telehealth (INDEPENDENT_AMBULATORY_CARE_PROVIDER_SITE_OTHER): Payer: Self-pay | Admitting: *Deleted

## 2022-04-05 MED ORDER — NA SULFATE-K SULFATE-MG SULF 17.5-3.13-1.6 GM/177ML PO SOLN: 1.0000 | Freq: Once | ORAL | 0 refills | Status: AC

## 2022-04-05 NOTE — Telephone Encounter (Signed)
Patient left voicemail asking about sending in bowel prep. He said to call or send him a [employer]. [place].

## 2022-04-06 DIAGNOSIS — M5459 Other low back pain: Secondary | ICD-10-CM | POA: Diagnosis not present

## 2022-04-06 DIAGNOSIS — M5451 Vertebrogenic low back pain: Secondary | ICD-10-CM | POA: Diagnosis not present

## 2022-04-09 DIAGNOSIS — M545 Low back pain, unspecified: Secondary | ICD-10-CM | POA: Diagnosis not present

## 2022-04-12 DIAGNOSIS — M5416 Radiculopathy, lumbar region: Secondary | ICD-10-CM | POA: Diagnosis not present

## 2022-04-12 DIAGNOSIS — M48062 Spinal stenosis, lumbar region with neurogenic claudication: Secondary | ICD-10-CM | POA: Diagnosis not present

## 2022-04-15 NOTE — Telephone Encounter (Signed)
Please see the MyChart message reply(ies) for my assessment and plan.    This patient gave consent for this Medical Advice Message and is aware that it may result in a bill to [employer], as well as the possibility of receiving a bill for a co-payment or deductible. They are an established patient, but are not seeking medical advice exclusively about a problem treated during an in person or video visit in the last seven days. I did not recommend an in person or video visit within seven days of my reply.    I spent a total of 20 minutes cumulative time within 7 days through [employer].  Gabriel Rung, NP

## 2022-04-16 DIAGNOSIS — M545 Low back pain, unspecified: Secondary | ICD-10-CM | POA: Diagnosis not present

## 2022-04-17 DIAGNOSIS — M5416 Radiculopathy, lumbar region: Secondary | ICD-10-CM | POA: Diagnosis not present

## 2022-04-18 DIAGNOSIS — M545 Low back pain, unspecified: Secondary | ICD-10-CM | POA: Diagnosis not present

## 2022-04-22 ENCOUNTER — Encounter (INDEPENDENT_AMBULATORY_CARE_PROVIDER_SITE_OTHER): Payer: Self-pay

## 2022-04-22 ENCOUNTER — Telehealth (INDEPENDENT_AMBULATORY_CARE_PROVIDER_SITE_OTHER): Payer: Self-pay | Admitting: *Deleted

## 2022-04-22 NOTE — Progress Notes (Signed)
GI Office Note    Referring Provider: [name], MD Primary Care Physician:  [name], MD Primary Gastroenterologist: [name], MD   Date:  04/26/2022  ID:  Frank Weber, DOB 08-29-1950, MRN [id number]   Chief Complaint   Chief Complaint  Patient presents with   Constipation    Still constipated, nauseated     History of Present Illness  Frank Weber is a 71 y.o. male with a history of GERD, hypothyroidism, HLD, peripheral neuropathy, and IBS-C presenting today with complaint of ongoing/worsening constipation.  Colonoscopy 01/10/2021: -2 polyps in the ascending colon - 9 polyps in the descending, transverse, descending colon - Left-sided diverticulosis - Nonbleeding internal hemorrhoids - Recommended 2 year follow up.   EGD 01/10/2021: (Performed due to early satiety and weight loss) -Salmon-colored mucosa suspicious for short segment Barrett's esophagus s/p biopsy - Erythematous mucosa in antrum s/p biopsy - Normal duodenum - Recommended gastric emptying study if symptoms persist.  CT chest abdomen pelvis with contrast 02/27/2022: -No acute findings within the chest, abdomen, or pelvis -Small nonspecific pulmonary nodules present, no need for follow-up imaging if low risk -Moderate stool burden within the colon -Sigmoid diverticulosis without diverticulitis  Last office visit 03/21/2022.  Patient seen for constipation and 20 pound weight loss over the prior 2-3 months.  Reportedly a previous smoker.  Did admit to a lot of dental issues.  Previously doing MiraLAX twice daily, saline 3 times daily causing of diarrhea.  Reported ongoing need to strain.  Also reported lack of water.  He denied any abdominal pain.  Having some nausea but no vomiting.  Previously on Amitiza but was not currently taking.  He was given samples of Linzess 72 mcg and advised to stop MiraLAX.  Also advised to increase water intake, continue probiotic, and follow a  high-fiber diet.  Also advised to increase protein shakes to 3 times daily.  Per review of chart it appears patient has been experiencing difficulty obtaining Linzess having adequate bowel movements despite recent increases.  Reported that despite Linzess he was only having very hard stools fairly frequently due to incomplete emptying described as hard and grape sized.  Completed bowel prep 04/06/2022 and then resumed Linzess 145 mcg daily thereafter.  He then reported various small sized hard lumpy stools thereafter as well as abdominal pain and discomfort associated with nausea.  He admits to straining given that he always feel he had an urge to have a bowel movement but would not go.  He did report improvement of stools getting cleared out with a bowel prep.  Reported constant sensation of feeling like stool stuck in his anus.  04/11/2022 he reported lack of appetite but no vomiting.  Had tried a suppository that evening and only reported " a couple small clumps of stool (about 2 to 3 inches long by 3/4 inch to 1 inch in diameter)".  He was advised that he could use an enema if needed or to take MiraLAX in addition to the Linzess taking 1 capsule every 12 hours..  Reported assistance with bowel movements with enemas and denied any diarrhea with use of MiraLAX and Linzess.  Reported a bowel movement on 04/15/2022 described as mostly diarrhea.  He then began having up to 5 loose stools per day and had cut back MiraLAX to once per day (mix of liquid and small pieces of stool) also with nausea throughout most of the day he reported he was not eating well or sleeping well.  He is curious about needing to repeat colonoscopy.  He was then advised to schedule an office visit to discuss further.  Patient updated 04/24/2022 that he is still feeling pretty bad but expressed that he was approved for his Linzess and had just received his 9-monthsupply and is approved until December 2024.  Today: Constipation - Taking  Linzess daily and miralax once daily (had excessive loose stools wit twice daily dosing). Very small bowel movement this morning. Not as much abdominal pain but has been having a full feeling and no appetite. Has not been enjoying eating due to his dental issues. After the first of the year he will get more permanent snap in dentures. Was losing weight before that. Reports he was previously 270 pounds (not normal weight). Had quit smoking just prior to that has well. Had started losing about a year and a half ago. Stools have been bristol 5/6/7. Has previously used enemas in the past. No BM yesterday at all. Has twinges of LLQ abdominal pain. Has some straining from time to time but not a regular basis.   Nausea/weight loss - ongoing. Has not had any vomiting. Has some relief at night and hot bath will help some. Some days have been better than others. No appetite. Has been eating a fair amount of dairy products. Has been getting more nauseas after drinking boost (usually drink 1 per day, sometimes 2).   Patient provided copy of most recent labs 02/15/2022 with glucose 110, creatinine 1.31, GFR 59, albumin 5.1, hemoglobin 16.2, MCV 94, sodium 140, potassium 4.6, normal LFTs, B12 237, folate 11.2, TSH 4.08, vitamin D 90.6, amylase 62, lipase 19, cortisol 17.6.  Wt Readings from Last 3 Encounters:  04/25/22 184 lb (83.5 kg)  03/21/22 186 lb (84.4 kg)  09/17/21 196 lb 4.8 oz (89 kg)     Current Outpatient Medications  Medication Sig Dispense Refill   ALPRAZOLAM XR 1 MG 24 hr tablet Take 1 mg by mouth at bedtime.     aspirin EC 81 MG tablet Take 1 tablet (81 mg total) by mouth daily. Swallow whole. 90 tablet 3   atorvastatin (LIPITOR) 20 MG tablet Take 20 mg by mouth daily.     calcium elemental as carbonate (TUMS ULTRA 1000) 400 MG chewable tablet Chew 2,000 mg by mouth daily as needed for heartburn.     Cholecalciferol (DIALYVITE VITAMIN D 5000) 125 MCG (5000 UT) capsule Take 5,000 Units by mouth  daily.     gabapentin (NEURONTIN) 300 MG capsule Take 900 mg by mouth 4 (four) times daily.     levothyroxine (SYNTHROID, LEVOTHROID) 25 MCG tablet Take 25 mcg by mouth daily before breakfast.     linaclotide (LINZESS) 145 MCG CAPS capsule      LORazepam (ATIVAN) 0.5 MG tablet Take 0.5-1 mg by mouth daily as needed for anxiety.     nitroGLYCERIN (NITROSTAT) 0.4 MG SL tablet Place 1 tablet (0.4 mg total) under the tongue every 5 (five) minutes as needed. 25 tablet 3   ondansetron (ZOFRAN-ODT) 8 MG disintegrating tablet Take 1 tablet (8 mg total) by mouth every 8 (eight) hours as needed for nausea or vomiting. 20 tablet 1   Polyethyl Glycol-Propyl Glycol (SYSTANE ULTRA) 0.4-0.3 % SOLN Place 2 drops into both eyes as needed (for dry eyes).      polyethylene glycol (MIRALAX / GLYCOLAX) 17 g packet Take 17 g by mouth 2 (two) times daily.     Probiotic Product (TRUBIOTICS PO) Take 1 capsule  by mouth at bedtime.     rizatriptan (MAXALT-MLT) 10 MG disintegrating tablet Take 10 mg by mouth as needed (for migraines and may repeat once in 2 hours, if no relief).      vitamin k 100 MCG tablet Take 100 mcg by mouth daily.     No current facility-administered medications for this visit.    Past Medical History:  Diagnosis Date   Arthritis    right knee   Constipation    Frequency of urination    GERD (gastroesophageal reflux disease)    Heart murmur    "slight"    History of kidney stones    History of melanoma excision    2012--  NECK/ HEAD   History of squamous cell carcinoma excision    RIGHT EAR   Hyperlipidemia    Hypothyroidism    IBS (irritable bowel syndrome)    states slight   Lumbar stenosis    L5 - S1   Migraines    Peripheral neuropathy    legs and feet   Right flank pain    Right ureteral stone    Urgency of urination    Wears glasses     Past Surgical History:  Procedure Laterality Date   BIOPSY  01/10/2021   Procedure: BIOPSY;  Surgeon: [name], MD;   [facility]: [facility];  Service: Gastroenterology;;   CARDIAC CATHETERIZATION  07-08-2007  dr Tami Ribas    no significant CAD, preserved LVF, ef 50%//  30% pLAD   CARDIOVASCULAR STRESS TEST  06-20-2011  dr croitoru   Low risk study/  normal perfusion scan showing attenuation artifact in the anterior region of myocardium with no ischemia , infarct or scar/  normal LV funciton and wall motion , ef 62%   COLONOSCOPY N/A 02/17/2013   Rehman:examination performed to cecum. mild sigmoid colon diverticulosis, 4 small polyps ablated via cold biopsy from rectosigmoid junction (hyperplastic). External hemorrhoids.   COLONOSCOPY WITH PROPOFOL N/A 01/10/2021   Procedure: COLONOSCOPY WITH PROPOFOL;  Surgeon: [name], MD;  [facility]: [facility];  Service: Gastroenterology;  Laterality: N/A;   CYSTOSCOPY W/ URETERAL STENT PLACEMENT Right 06/18/2015   Procedure: CYSTOSCOPY WITH RIGHT RETROGRADE PYELOGRAM RIGHT URETERAL STENT PLACEMENT;  Surgeon: [name], MD;  Location: [facility];  Service: Urology;  Laterality: Right;   CYSTOSCOPY/URETEROSCOPY/HOLMIUM LASER/STENT PLACEMENT Right 06/27/2015   Procedure: CYSTOSCOPY RIGHT URETEROSCOPY  STENT REMOVAL; STONE EXTRACTION WITH BASKET;  Surgeon: [name], MD;  Location: [facility];  Service: Urology;  Laterality: Right;   ESOPHAGOGASTRODUODENOSCOPY (EGD) WITH PROPOFOL N/A 01/10/2021   Procedure: ESOPHAGOGASTRODUODENOSCOPY (EGD) WITH PROPOFOL;  Surgeon: [name], MD;  [facility];  Service: Gastroenterology;  Laterality: N/A;  11:00, moved up per [name] - pt knows arrival time   [place] EXCISIONAL BIOPSY     HOLMIUM LASER APPLICATION Right 54/62/7035   Procedure: HOLMIUM LASER LITHOTRIPSY ;  Surgeon: [name], MD;  Location: [facility];  Service: Urology;  Laterality: Right;   I & D EXTREMITY Right 07/14/2018   Procedure: IRRIGATION AND DEBRIDEMENT RIGHT KNEE WITH CLOSURE;   Surgeon: Nicholes Stairs, MD;  Location: [place];  Service: Orthopedics;  Laterality: Right;   INGUINAL HERNIA REPAIR Left 10/21/2007   KNEE ARTHROSCOPY W/ ACL RECONSTRUCTION Right 1992   MENISCUS REPAIR Right    2019, dr Theda Sers    POLYPECTOMY  01/10/2021   Procedure: POLYPECTOMY;  Surgeon: [name], MD;  [facility];  Service: Gastroenterology;;   SHOULDER ARTHROSCOPY WITH OPEN ROTATOR CUFF REPAIR Left 07/27/2014   Procedure: LEFT SHOULDER ARTHROSCOPY WITH MINI OPEN ROTATOR CUFF REPAIR;  Surgeon: [name], MD;  Location: [place];  Service: Orthopedics;  Laterality: Left;   SHOULDER ARTHROSCOPY WITH OPEN ROTATOR CUFF REPAIR Right 2006   TONSILLECTOMY AND ADENOIDECTOMY  age 82   TOTAL KNEE ARTHROPLASTY Right 07/10/2018   Procedure: TOTAL KNEE ARTHROPLASTY;  Surgeon: [name], MD;  Location: [facility];  Service: Orthopedics;  Laterality: Right;   TRANSTHORACIC ECHOCARDIOGRAM  07/30/2007   normal LVF, ef >55%/  mild AR, MR , PR and TR/  mild AV sclerosis without stenosis/    URETEROSCOPY Right 06/18/2015   Procedure: URETEROSCOPY;  Surgeon: [name], MD;  Location: [facility];  Service: Urology;  Laterality: Right;    Family History  Problem Relation Age of Onset   Colon cancer Other        Age of Onset-late 51's    Allergies as of 04/25/2022 - Review Complete 04/25/2022  Allergen Reaction Noted   Sulfa antibiotics Anaphylaxis, Shortness Of Breath, and Rash 07/20/2014   Coconut (cocos nucifera) Rash 07/20/2014    Social History   Socioeconomic History   Marital status: Married    Spouse name: Not on file   Number of children: Not on file   Years of education: Not on file   Highest education level: Not on file  Occupational History   Not on file  Tobacco Use   Smoking status: Former    Packs/day: 0.00    Years: 30.00    Total pack years: 0.00    Types: Cigars, Cigarettes   Smokeless tobacco: Never  Vaping Use    Vaping Use: Never used  Substance and Sexual Activity   Alcohol use: Yes    Comment: seldom    Drug use: No   Sexual activity: Not on file  Other Topics Concern   Not on file  Social History Narrative   Not on file   Social Determinants of Health   Financial Resource Strain: Not on file  Food Insecurity: Not on file  Transportation Needs: Not on file  Physical Activity: Not on file  Stress: Not on file  Social Connections: Not on file     Review of Systems   Gen: Denies fever, chills, anorexia. Denies fatigue, weakness, weight loss.  CV: Denies chest pain, palpitations, syncope, peripheral edema, and claudication. Resp: Denies dyspnea at rest, cough, wheezing, coughing up blood, and pleurisy. GI: See HPI Derm: Denies rash, itching, dry skin Psych: Denies depression, anxiety, memory loss, confusion. No homicidal or suicidal ideation.  Heme: Denies bruising, bleeding, and enlarged lymph nodes.   Physical Exam   BP 107/69 (BP Location: Right Arm, Patient Position: Sitting, Cuff Size: Normal)   Pulse 66   Temp 97.8 F (36.6 C) (Temporal)   Ht '6\' 3"'$  (1.905 m)   Wt 184 lb (83.5 kg)   SpO2 100%   BMI 23.00 kg/m   General:   Alert and oriented. No distress noted. Pleasant and cooperative.  Head:  Normocephalic and atraumatic. Eyes:  Conjuctiva clear without scleral icterus. Mouth:  Oral mucosa pink and moist. Good dentition. No lesions. Lungs:  Clear to auscultation bilaterally. No wheezes, rales, or rhonchi. No distress.  Heart:  S1, S2 present without murmurs appreciated.  Abdomen:  +BS, soft, non-distended. Mild tenderness to lower abdomen. No rebound or guarding. No HSM or masses noted. Rectal:  deferred Msk:  Symmetrical without gross deformities. Normal posture. Extremities:  Without edema. Neurologic:  Alert and  oriented x4 Psych:  Alert and cooperative. Normal mood and affect.   Assessment  Frank Weber is a 71 y.o. male with a history of GERD,  hypothyroidism, HLD, peripheral neuropathy, and IBS-C presenting today with complaint of ongoing/worsening constipation.  IBS with constipation: Chronic.  Has tried multiple modalities of treatment and previously failed max dose of MiraLAX.  Previously recommended Linzess orally with year but reported that he was not covered by insurance and Trulance and Amitiza were not affordable.  In April he was recommended to do MiraLAX 3 times daily.  His last follow-up visit 03/21/2022 he was given samples of Linzess and patient assistance was initiated.  He was advised continue probiotic and increase water intake as well as follow high-fiber diet.  Since his last visit he has completed a bowel prep and was cleaned out and had his Linzess increased from 72 mcg daily to 145 mcg daily.  He continues to report issues with constipation even after bowel prep.  Has continued urge to have a bowel movement and has continued to strain.  He has reported various sizes and stool consistencies.  Currently taking Linzess daily in the mornings as well as MiraLAX 1 capful daily in the evenings.  Has been attempting to follow a higher fiber diet, suspect that he is lacking in fluids that are hydrating given he consumes a frequent amount of coffee.  Also has frequent consumption of dairy products which may be contributing to his nausea, and any abdominal discomfort.  Have advised Rehman to avoid these and continue Linzess and MiraLAX.  May potentially benefit from increase in [place] pending gastric emptying study or the addition of Motegrity to his bowel regimen.  If positive for gastroparesis, addition of Motegrity may be more beneficial.  Nausea, lack appetite, early satiety, weight loss: At patient's appointment 03/21/2022 patient reported 20 pound weight loss over the prior 2-3 months.  Patient has been having a lot of dental issues and procedures performed and is currently awaiting a snap-on denture in order to better be able to eat.   He previously suspected that his dental issues and multiple courses of antibiotics may be contributing to his lack of appetite and nausea.  This is very possible.  Last EGD in August 2022 with salmon-colored mucosa suspicious for Barrett's and mild reactive gastropathy negative for H. pylori.  Per review of patient's weight, his weight was stable today compared to his visit 1 month ago.  He does endorse forcing himself to eat.  Patient provided a very detailed list of his diet and symptoms over the last 2 weeks, this will be scanned into the chart for reference.  Recommendation after most recent EGD was that of nausea, early satiety, and weight loss worsened to perform gastric emptying study.  We will proceed with this to assess for any idiopathic gastroparesis as cause for any of the symptoms.  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  He will be due in August 2024 for surveillance.  Last TSH within normal limits and most recent CT chest, abdomen, and pelvis unremarkable other than moderate stool burden.  4 mg Zofran takes a while to kick in for him and has not been very effective.  We will increase Zofran to 8 mg ODT for quicker relief of nausea, currently only uses this about once per day.  Suspect some of  his symptoms may be secondary to dietary choices and we discussed avoiding lactose.   PLAN    Gastric emptying study Continue Linzess 145 mcg daily. Continue miralax Zofran 8 mg ODT up to three times daily as needed. Avoid diary, specifically lactose-containing products.  This includes honey buns that he eats on a regular basis.  May also benefit from decrease caffeine. Follow up with Dr. Judy Pimple, MSN, FNP-BC, AGACNP-BC [facility]  I have reviewed the note and agree with the APP's assessment as described in this progress note  Maylon Peppers, MD Gastroenterology and [facility]

## 2022-04-22 NOTE — Telephone Encounter (Signed)
Per Vikki Ports - Can we see if there are any follow up spots with any of [place] [facility]Ps in the near future? Ideally he needs to probably come in to be seen .   I will let patient know he needs to be seen and someone will reach out to schedule him.

## 2022-04-22 NOTE — Telephone Encounter (Signed)
OV schd 04/25/22, sent MyChart message to patient

## 2022-04-23 DIAGNOSIS — M545 Low back pain, unspecified: Secondary | ICD-10-CM | POA: Diagnosis not present

## 2022-04-24 NOTE — Telephone Encounter (Signed)
Patient is scheduled with Loma Sousa for tomorrow

## 2022-04-25 ENCOUNTER — Encounter: Payer: Self-pay | Admitting: *Deleted

## 2022-04-25 ENCOUNTER — Telehealth: Payer: Self-pay | Admitting: *Deleted

## 2022-04-25 ENCOUNTER — Ambulatory Visit (INDEPENDENT_AMBULATORY_CARE_PROVIDER_SITE_OTHER): Payer: Medicare Other | Admitting: Gastroenterology

## 2022-04-25 ENCOUNTER — Encounter: Payer: Self-pay | Admitting: Gastroenterology

## 2022-04-25 VITALS — BP 107/69 | HR 66 | Temp 97.8°F | Ht 75.0 in | Wt 184.0 lb

## 2022-04-25 DIAGNOSIS — K5900 Constipation, unspecified: Secondary | ICD-10-CM

## 2022-04-25 DIAGNOSIS — R6881 Early satiety: Secondary | ICD-10-CM | POA: Diagnosis not present

## 2022-04-25 DIAGNOSIS — R110 Nausea: Secondary | ICD-10-CM | POA: Diagnosis not present

## 2022-04-25 DIAGNOSIS — R634 Abnormal weight loss: Secondary | ICD-10-CM

## 2022-04-25 DIAGNOSIS — R630 Anorexia: Secondary | ICD-10-CM

## 2022-04-25 DIAGNOSIS — M545 Low back pain, unspecified: Secondary | ICD-10-CM | POA: Diagnosis not present

## 2022-04-25 MED ORDER — ONDANSETRON 8 MG PO TBDP: 8.0000 mg | ORAL_TABLET | Freq: Three times a day (TID) | ORAL | 1 refills | Status: DC | PRN

## 2022-04-25 NOTE — Telephone Encounter (Signed)
Informed pt of gastric emptying study is scheduled for 05/07/22 at 8am, arrive at 7:30 am, nothing to eat or drink after midnight, no stomach medications morning of procedure. Sent appointment via MyChart per pt's request.

## 2022-04-25 NOTE — Patient Instructions (Addendum)
We are scheduling you for a gastric emptying study in the near future. Our schedulers will reach out to you with a date/time for the imaging.  I have increased her Zofran to 8 mg.  This is the oral disintegrating tablet which she will place under your tongue.  This should hopefully provide you quicker relief.  You can take it twice daily if needed.  If you are having a severe day you may take it up to 3 times.  On days where you are having significant nausea, please follow a soft/bland diet, focusing on things like toast, applesauce, and lean plain protein like chicken or [place].  Continues to your Linzess 145 mcg daily, and MiraLAX 1 capful daily.  You may continue to take the Linzess in the morning and MiraLAX at night if you prefer.  If you think taking them both in the morning would provide you a better sensation of complete emptying, you may try taking them both at the same time.  With this you may experience excessive amount of looser stools initially.  If you decide that you would like to increase her Linzess dose and stop MiraLAX, just let me know as there is 1 higher strength of the Linzess.  We will determine follow-up after gastric emptying study, and we will discuss these findings and neck steps with Dr. Jenetta Downer.  It was a pleasure to see you today. I want to create trusting relationships with patients. If you receive a survey regarding your visit,  I greatly appreciate you taking time to fill this out on paper or through your MyChart. I value your feedback.  Venetia Night, MSN, FNP-BC, AGACNP-BC [facility]

## 2022-05-06 DIAGNOSIS — M545 Low back pain, unspecified: Secondary | ICD-10-CM | POA: Diagnosis not present

## 2022-05-07 ENCOUNTER — Ambulatory Visit (HOSPITAL_COMMUNITY)
Admission: RE | Admit: 2022-05-07 | Discharge: 2022-05-07 | Disposition: A | Discharge status: Home / Self Care | Payer: Medicare Other | Source: Ambulatory Visit | Attending: Gastroenterology | Admitting: Gastroenterology

## 2022-05-07 ENCOUNTER — Encounter (HOSPITAL_COMMUNITY): Payer: Self-pay

## 2022-05-07 DIAGNOSIS — R630 Anorexia: Secondary | ICD-10-CM | POA: Diagnosis not present

## 2022-05-07 DIAGNOSIS — R110 Nausea: Secondary | ICD-10-CM | POA: Diagnosis not present

## 2022-05-07 DIAGNOSIS — R6881 Early satiety: Secondary | ICD-10-CM | POA: Diagnosis not present

## 2022-05-07 HISTORY — DX: Malignant (primary) neoplasm, unspecified: C80.1

## 2022-05-07 HISTORY — DX: Disorder of kidney and ureter, unspecified: N28.9

## 2022-05-07 MED ORDER — TECHNETIUM TC 99M SULFUR COLLOID
2.0000 | Freq: Once | INTRAVENOUS | Status: AC | PRN
  Administered 2022-05-07: 2.2 via ORAL

## 2022-05-08 DIAGNOSIS — M545 Low back pain, unspecified: Secondary | ICD-10-CM | POA: Diagnosis not present

## 2022-05-09 DIAGNOSIS — D692 Other nonthrombocytopenic purpura: Secondary | ICD-10-CM | POA: Diagnosis not present

## 2022-05-09 DIAGNOSIS — L820 Inflamed seborrheic keratosis: Secondary | ICD-10-CM | POA: Diagnosis not present

## 2022-05-09 DIAGNOSIS — L812 Freckles: Secondary | ICD-10-CM | POA: Diagnosis not present

## 2022-05-09 DIAGNOSIS — Z85828 Personal history of other malignant neoplasm of skin: Secondary | ICD-10-CM | POA: Diagnosis not present

## 2022-05-09 DIAGNOSIS — L821 Other seborrheic keratosis: Secondary | ICD-10-CM | POA: Diagnosis not present

## 2022-05-09 DIAGNOSIS — L853 Xerosis cutis: Secondary | ICD-10-CM | POA: Diagnosis not present

## 2022-05-09 DIAGNOSIS — L570 Actinic keratosis: Secondary | ICD-10-CM | POA: Diagnosis not present

## 2022-05-22 ENCOUNTER — Other Ambulatory Visit (INDEPENDENT_AMBULATORY_CARE_PROVIDER_SITE_OTHER): Payer: Self-pay | Admitting: Gastroenterology

## 2022-05-22 MED ORDER — MIRTAZAPINE 15 MG PO TABS: 15.0000 mg | ORAL_TABLET | Freq: Every day | ORAL | 1 refills | Status: DC

## 2022-05-27 ENCOUNTER — Ambulatory Visit (INDEPENDENT_AMBULATORY_CARE_PROVIDER_SITE_OTHER): Payer: Medicare Other | Admitting: Gastroenterology

## 2022-05-29 DIAGNOSIS — Z23 Encounter for immunization: Secondary | ICD-10-CM | POA: Diagnosis not present

## 2022-06-24 ENCOUNTER — Ambulatory Visit (INDEPENDENT_AMBULATORY_CARE_PROVIDER_SITE_OTHER): Payer: Medicare Other | Admitting: Gastroenterology

## 2022-06-24 ENCOUNTER — Encounter (INDEPENDENT_AMBULATORY_CARE_PROVIDER_SITE_OTHER): Payer: Self-pay | Admitting: Gastroenterology

## 2022-06-24 VITALS — BP 110/67 | HR 96 | Temp 98.0°F | Ht 75.0 in | Wt 200.5 lb

## 2022-06-24 DIAGNOSIS — K5900 Constipation, unspecified: Secondary | ICD-10-CM

## 2022-06-24 DIAGNOSIS — R634 Abnormal weight loss: Secondary | ICD-10-CM

## 2022-06-24 DIAGNOSIS — R110 Nausea: Secondary | ICD-10-CM | POA: Diagnosis not present

## 2022-06-24 MED ORDER — MIRTAZAPINE 15 MG PO TABS: 15.0000 mg | ORAL_TABLET | Freq: Every day | ORAL | 2 refills | Status: DC

## 2022-06-24 NOTE — Progress Notes (Addendum)
Referring Provider: [name], MD Primary Care Physician:  [name], MD Primary GI Physician: Jenetta Downer   Chief Complaint  Patient presents with   Constipation    Follow up on constipation. Has 1 -2 stools per day for the last couple of weeks. Sometimes will skip a day or two without a BM. Reports he has gained some weight but not sure how much. Takes about one protein shake a day and sometimes forgets to take one. Last  weight in chart was 184 lbs on 04/25/22. Reports appetite is better now.    HPI:   Frank Weber is a 72 y.o. male with past medical history of GERD, hypothyroidism, HLD, peripheral neuropathy, and IBS-C presenting today with complaint of ongoing/worsening constipation.   Patient presenting today for follow up of constipation and weight loss.  Last seen 04/25/22, at that time Taking Linzess daily, miralax once daily (had excessive loose stools wit twice daily dosing).  Continued to have a full feeling and no appetite. Has not been enjoying eating due to his dental issues. Reports he was previously 270 pounds (not normal weight).  Stools have been bristol 5/6/7. Has twinges of LLQ abdominal pain. Has some straining from time to time but not a regular basis.    Nausea/weight loss - ongoing. Has some relief at night and hot bath will help some. Some days have been better than others. No appetite. Has been eating a fair amount of dairy products. Has been getting more nauseas after drinking boost (usually drink 1 per day, sometimes 2).   Recommended GES, Linzess 176mg daily, miralax daily, zofran '8mg'$  ODT TID PRN, avoid dairy, decrease caffeine.   GES was normal, patient reached out to our office in December with continued issues with weight loss/nausea. Started on Remeron '15mg'$  QHS on 12/13.   Present:   Constipation:constipation is doing better, he is currently on linzess 1456m daily, was doing miralax but started having some diarrhea last week, he is having a  BM daily for the most part. States that stools have been a little harder than normal but not hard to get out. He does not drink much water, mostly coffee and tea. He is eating a decent amount of veggies. Has had some occasional LLQ pain, had pain yesterday that did not improve with BM but It has since resolved. He is approved for linzess patient assistance through 12/24. Abdominal fullness has improved some.   Nausea/weight loss: weight 200 lbs today, last 184 lbs in November. Notes that appetite is better since starting remeron. He feels that he is eating better. Still has some nausea, though decreased in frequency, maybe only twice per week now, taking zofran maybe twice a week with good results.   Patient denies melena, hematochezia, vomiting, dysphagia, odyonophagia, early satiety or weight loss.   CT chest abdomen pelvis with contrast 02/27/2022: -No acute findings within the chest, abdomen, or pelvis -Small nonspecific pulmonary nodules present, no need for follow-up imaging if low risk -Moderate stool burden within the colon -Sigmoid diverticulosis without diverticulitis Last EGD 01/2021 - Salmon-colored mucosa suspicious for short-segment Barrett's esophagus. Biopsied - reflux changes. - Erythematous mucosa in the antrum. Biopsied - reactive gastropathy neg for HP. - Normal examined duodenum.  Last Colonoscopy: 01/2021 - Two 2 mm polyps in the ascending colon, removed with a cold biopsy forceps. Resected and retrieved. Path TA x2. - Nine 3 to 8 mm polyps in the descending colon, in the transverse colon and in the ascending colon, removed  with a cold snare. Resected and retrieved. Path 8/9 tubular adenomas - Diverticulosis in the sigmoid colon and in the descending colon. - Non-bleeding internal hemorrhoids.   Repeat TCS August 2024   Past Medical History:  Diagnosis Date   Arthritis    right knee   Cancer ([place])    Skin   Constipation    Frequency of urination    GERD  (gastroesophageal reflux disease)    Heart murmur    "slight"    History of kidney stones    History of melanoma excision    2012--  NECK/ HEAD   History of squamous cell carcinoma excision    RIGHT EAR   Hyperlipidemia    Hypothyroidism    IBS (irritable bowel syndrome)    states slight   Lumbar stenosis    L5 - S1   Migraines    Peripheral neuropathy    legs and feet   Renal insufficiency    Right flank pain    Right ureteral stone    Urgency of urination    Wears glasses     Past Surgical History:  Procedure Laterality Date   BIOPSY  01/10/2021   Procedure: BIOPSY;  Surgeon: [name], MD;  Location: [facility];  Service: Gastroenterology;;   CARDIAC CATHETERIZATION  07-08-2007  dr Tami Ribas    no significant CAD, preserved LVF, ef 50%//  30% pLAD   CARDIOVASCULAR STRESS TEST  06-20-2011  dr croitoru   Low risk study/  normal perfusion scan showing attenuation artifact in the anterior region of myocardium with no ischemia , infarct or scar/  normal LV funciton and wall motion , ef 62%   COLONOSCOPY N/A 02/17/2013   Rehman:examination performed to cecum. mild sigmoid colon diverticulosis, 4 small polyps ablated via cold biopsy from rectosigmoid junction (hyperplastic). External hemorrhoids.   COLONOSCOPY WITH PROPOFOL N/A 01/10/2021   Procedure: COLONOSCOPY WITH PROPOFOL;  Surgeon: [name], MD;  Location: [facility];  Service: Gastroenterology;  Laterality: N/A;   CYSTOSCOPY W/ URETERAL STENT PLACEMENT Right 06/18/2015   Procedure: CYSTOSCOPY WITH RIGHT RETROGRADE PYELOGRAM RIGHT URETERAL STENT PLACEMENT;  Surgeon: [name], MD;  Location: [facility];  Service: Urology;  Laterality: Right;   CYSTOSCOPY/URETEROSCOPY/HOLMIUM LASER/STENT PLACEMENT Right 06/27/2015   Procedure: CYSTOSCOPY RIGHT URETEROSCOPY  STENT REMOVAL; STONE EXTRACTION WITH BASKET;  Surgeon: [name], MD;  Location: [facility];  Service: Urology;  Laterality:  Right;   ESOPHAGOGASTRODUODENOSCOPY (EGD) WITH PROPOFOL N/A 01/10/2021   Procedure: ESOPHAGOGASTRODUODENOSCOPY (EGD) WITH PROPOFOL;  Surgeon: [name], MD;  [facility]: [facility];  Service: Gastroenterology;  Laterality: N/A;  11:00, moved up per [name] - pt knows arrival time   [place] EXCISIONAL BIOPSY     HOLMIUM LASER APPLICATION Right 85/92/9244   Procedure: HOLMIUM LASER LITHOTRIPSY ;  Surgeon: [name], MD;  Location: [facility];  Service: Urology;  Laterality: Right;   I & D EXTREMITY Right 07/14/2018   Procedure: IRRIGATION AND DEBRIDEMENT RIGHT KNEE WITH CLOSURE;  Surgeon: Nicholes Stairs, MD;  Location: [facility];  Service: Orthopedics;  Laterality: Right;   INGUINAL HERNIA REPAIR Left 10/21/2007   KNEE ARTHROSCOPY W/ ACL RECONSTRUCTION Right 1992   MENISCUS REPAIR Right    2019, dr Theda Sers    POLYPECTOMY  01/10/2021   Procedure: POLYPECTOMY;  Surgeon: [name], MD;  [facility];  Service: Gastroenterology;;   SHOULDER ARTHROSCOPY WITH OPEN ROTATOR CUFF REPAIR Left  07/27/2014   Procedure: LEFT SHOULDER ARTHROSCOPY WITH MINI OPEN ROTATOR CUFF REPAIR;  Surgeon: [name], MD;  Location: [place];  Service: Orthopedics;  Laterality: Left;   SHOULDER ARTHROSCOPY WITH OPEN ROTATOR CUFF REPAIR Right 2006   TONSILLECTOMY AND ADENOIDECTOMY  age 34   TOTAL KNEE ARTHROPLASTY Right 07/10/2018   Procedure: TOTAL KNEE ARTHROPLASTY;  Surgeon: [name], MD;  Location: [facility];  Service: Orthopedics;  Laterality: Right;   TRANSTHORACIC ECHOCARDIOGRAM  07/30/2007   normal LVF, ef >55%/  mild AR, MR , PR and TR/  mild AV sclerosis without stenosis/    URETEROSCOPY Right 06/18/2015   Procedure: URETEROSCOPY;  Surgeon: [name], MD;  Location: [facility];  Service: Urology;  Laterality: Right;    Current Outpatient Medications  Medication Sig Dispense Refill   ALPRAZOLAM XR 1 MG 24 hr tablet  Take 1 mg by mouth at bedtime.     aspirin EC 81 MG tablet Take 1 tablet (81 mg total) by mouth daily. Swallow whole. 90 tablet 3   atorvastatin (LIPITOR) 20 MG tablet Take 20 mg by mouth daily.     calcium elemental as carbonate (TUMS ULTRA 1000) 400 MG chewable tablet Chew 2,000 mg by mouth daily as needed for heartburn.     Cholecalciferol (DIALYVITE VITAMIN D 5000) 125 MCG (5000 UT) capsule Take 5,000 Units by mouth daily.     gabapentin (NEURONTIN) 300 MG capsule Take 900 mg by mouth 4 (four) times daily.     levothyroxine (SYNTHROID, LEVOTHROID) 25 MCG tablet Take 25 mcg by mouth daily before breakfast.     linaclotide (LINZESS) 145 MCG CAPS capsule Take 145 mcg by mouth daily before breakfast.     LORazepam (ATIVAN) 0.5 MG tablet Take 0.5-1 mg by mouth daily as needed for anxiety.     mirtazapine (REMERON) 15 MG tablet Take 1 tablet (15 mg total) by mouth at bedtime. 30 tablet 1   nitroGLYCERIN (NITROSTAT) 0.4 MG SL tablet Place 1 tablet (0.4 mg total) under the tongue every 5 (five) minutes as needed. 25 tablet 3   ondansetron (ZOFRAN-ODT) 8 MG disintegrating tablet Take 1 tablet (8 mg total) by mouth every 8 (eight) hours as needed for nausea or vomiting. 20 tablet 1   Polyethyl Glycol-Propyl Glycol (SYSTANE ULTRA) 0.4-0.3 % SOLN Place 2 drops into both eyes as needed (for dry eyes).      Probiotic Product (TRUBIOTICS PO) Take 1 capsule by mouth at bedtime.     rizatriptan (MAXALT-MLT) 10 MG disintegrating tablet Take 10 mg by mouth as needed (for migraines and may repeat once in 2 hours, if no relief).      vitamin k 100 MCG tablet Take 100 mcg by mouth daily.     polyethylene glycol (MIRALAX / GLYCOLAX) 17 g packet Take 17 g by mouth 2 (two) times daily. (Patient not taking: Reported on 06/24/2022)     No current facility-administered medications for this visit.    Allergies as of 06/24/2022 - Review Complete 06/24/2022  Allergen Reaction Noted   Sulfa antibiotics Anaphylaxis,  Shortness Of Breath, and Rash 07/20/2014   Coconut (cocos nucifera) Rash 07/20/2014    Family History  Problem Relation Age of Onset   Colon cancer Other        Age of Onset-late 70's    Social History   Socioeconomic History   Marital status: Married    Spouse name: Not on file   Number of children: Not on file  Years of education: Not on file   Highest education level: Not on file  Occupational History   Not on file  Tobacco Use   Smoking status: Some Days    Types: Cigars    Passive exposure: Current   Smokeless tobacco: Never   Tobacco comments:    Some days smokes cigars but not frequent.   Vaping Use   Vaping Use: Never used  Substance and Sexual Activity   Alcohol use: Yes    Comment: seldom    Drug use: No   Sexual activity: Not on file  Other Topics Concern   Not on file  Social History Narrative   Not on file   Social Determinants of Health   Financial Resource Strain: Not on file  Food Insecurity: Not on file  Transportation Needs: Not on file  Physical Activity: Not on file  Stress: Not on file  Social Connections: Not on file    Review of systems General: negative for malaise, night sweats, fever, chills, weight loss Neck: Negative for lumps, goiter, pain and significant neck swelling Resp: Negative for cough, wheezing, dyspnea at rest CV: Negative for chest pain, leg swelling, palpitations, orthopnea GI: denies melena, hematochezia, vomiting, diarrhea, constipation, dysphagia, odyonophagia, early satiety or unintentional weight loss. +nausea  MSK: Negative for joint pain or swelling, back pain, and muscle pain. Derm: Negative for itching or rash Psych: Denies depression, anxiety, memory loss, confusion. No homicidal or suicidal ideation.  Heme: Negative for prolonged bleeding, bruising easily, and swollen nodes. Endocrine: Negative for cold or heat intolerance, polyuria, polydipsia and goiter. Neuro: negative for tremor, gait imbalance,  syncope and seizures. The remainder of the review of systems is noncontributory.  Physical Exam: BP 110/67 (BP Location: Left Arm, Patient Position: Sitting, Cuff Size: Large)   Pulse 96   Temp 98 F (36.7 C) (Oral)   Ht '6\' 3"'$  (1.905 m)   Wt 200 lb 8 oz (90.9 kg)   BMI 25.06 kg/m  General:   Alert and oriented. No distress noted. Pleasant and cooperative.  Head:  Normocephalic and atraumatic. Eyes:  Conjuctiva clear without scleral icterus. Mouth:  Oral mucosa pink and moist. Good dentition. No lesions. Heart: Normal rate and rhythm, s1 and s2 heart sounds present.  Lungs: Clear lung sounds in all lobes. Respirations equal and unlabored. Abdomen:  +BS, soft, non-tender and non-distended. No rebound or guarding. No HSM or masses noted. Derm: No palmar erythema or jaundice Msk:  Symmetrical without gross deformities. Normal posture. Extremities:  Without edema. Neurologic:  Alert and  oriented x4 Psych:  Alert and cooperative. Normal mood and affect.  Invalid input(s): "6 MONTHS"   ASSESSMENT: TAEGEN DELKER is a 72 y.o. male presenting today for follow up of constipation, nausea and weight loss.  Constipation:doing well on linzess 172mg daily. Previously using miralax daily but then began having some diarrhea so he stopped. Stools are somewhat harder but he is not having to strain. Will continue linzess 1461m daily, can use miralax PRN if he notes stools becoming harder/needing to strain. Should Increase water intake, aim for atleast 64 oz per day. Continue with plenty of fruits, veggies and whole grains, kiwi and prunes are especially good for constipation.  Nausea/weight loss: nausea has reduced in frequency, needing zofran maybe twice per week with good results. He has gained 16 pounds since November with the initiation of remeron '15mg'$  QHS. Feels appetite is better as well. Will continue with zofran PRN, Remeron nightly at current dose. Also  providing the low FODMAP food guide as  this may be helpful in avoiding foods that tend to worsen his GI symptoms.     PLAN:  Continue linzess 131mg daily  2. Low FODMAP diet  3. Miralax PRN 4. Zofran PRN for nausea 5. Increase water intake, aim for 64 oz/day 6. Continue remeron '15mg'$  QHS 7. Continue with good intake of fruits, veggies, whole grains 8. Plan for repeat colonoscopy in August 2024  All questions were answered, patient verbalized understanding and is in agreement with plan as outlined above.   Follow Up: 6 months   Kisean Rollo L. CAlver Sorrow MSN, APRN, AGNP-C Adult-Gerontology Nurse Practitioner [facility]  I have reviewed the note and agree with the APP's assessment as described in this progress note  DMaylon Peppers MD Gastroenterology and Hepatology [facility]Mem[facility] PembrokeGastroenterology

## 2022-06-24 NOTE — Patient Instructions (Signed)
It was nice to see you and I am glad you are doing better! Let's continue the remeron '15mg'$  nightly, I have sent a refill of this Continue linzess 154mg daily and you can use miralax as needed if you are feeling that stools are harder or you are having to strain. Continue with good intake of fruits, veggies and whole grains, aim for 64oz water per day I am providing the low FODMAP food guide again as this can be a helpful in avoiding certain foods that tend to irritate the intestines Please reach out if you have any new or worsening symptoms  Follow up 6 months

## 2022-06-27 ENCOUNTER — Ambulatory Visit (INDEPENDENT_AMBULATORY_CARE_PROVIDER_SITE_OTHER): Payer: Medicare Other | Admitting: Gastroenterology

## 2022-07-03 NOTE — Progress Notes (Signed)
CARDIOLOGY CONSULT NOTE       Patient ID: Frank Weber MRN: [id number] DOB/AGE: 1951/01/23 72 y.o.  Referring Physician: [name] Primary Physician: [name], MD Primary Cardiologist: [name]     HPI:  72 y.o. referred by Dr [name] 11/29/19 for CAD risk Complained of SSCP 2-3 weeks on office visit 11/05/19 Seen in [facility] ED for same. Pain on left side and band around entire front and back. Near diaphragm Smoker over 30 pack years. Pain not always related to exertion No associated pleurisy, palpitations, syncope or dyspnea. 2009 had no obstructive CAD at cath. And normal echo in 2016 Pain helped by fentanyl w/u negative with no acute ECG changes, negative Troponin CT no PE some lung nodules and emphysema He had been seen by [facility] Dr Liz Beach and Tami Ribas  He smokes mostly cigars now daily  He is retired after 28 years in [employer] Married x 2 with one son in [place] Has grand daughter at [employer] doing [place] done 03/24/20 reviewed. Calcium score 227 which was 64 th percentile FFR CT 0.81 mid and 0.78 distal LAD Trial of medical Rx indicated  TTE reviewed from 12/15/19 Normal EF trivial MR/AR   Seen in ER 02/05/20 with diverticulitis Rx with Augmentin and Norco. Also history of bilateral inguinal hernias  No angina. Abdomen improved  Seen by Dr Domenic Polite 01/10/21 at [facility]. 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 After coming Home developed SSCP radiating to left arm Occurred after eating toast and drinking coffee Pain lasted 4-5 hours He R/O and there were no acute ECG changes He also has been having constipation and unintentional weight loss 6 lbs since April His colon 01/11/21 showed polyps,diverticulosis and hemorrhoids Pain Rx with Dilaudid Had some Leg pain and US showed no DVT TTE 01/11/21 EF 60-65% no RWMAls trivial AR and aortic root of 4.0  CXR with NAD  CTA with no PE LLL atelectasis /scarring   Admitted [facility] 12/29  /22 with URI and atypical chest pain Movement/cough with dull substernal chest pain Quit smoking October 2022 no change with nitro R/O no acute ECG changes COVID/Influenza negative CXR NAD   Myovue done 06/08/21 normal perfusion no ischemia EF 54% Echo 06/08/21 normal EF 60-65% aortic root 40 mm   Lots of dental issues having some pulled and will need implants   No chest pain   ROS All other systems reviewed and negative except as noted above  Past Medical History:  Diagnosis Date   Arthritis    right knee   Cancer ([place])    Skin   Constipation    Frequency of urination    GERD (gastroesophageal reflux disease)    Heart murmur    "slight"    History of kidney stones    History of melanoma excision    2012--  NECK/ HEAD   History of squamous cell carcinoma excision    RIGHT EAR   Hyperlipidemia    Hypothyroidism    IBS (irritable bowel syndrome)    states slight   Lumbar stenosis    L5 - S1   Migraines    Peripheral neuropathy    legs and feet   Renal insufficiency    Right flank pain    Right ureteral stone    Urgency of urination    Wears glasses     Family History  Problem Relation Age of Onset   Colon cancer Other  Age of Onset-late 2's    Social History   Socioeconomic History   Marital status: Married    Spouse name: Not on file   Number of children: Not on file   Years of education: Not on file   Highest education level: Not on file  Occupational History   Not on file  Tobacco Use   Smoking status: Some Days    Types: Cigars    Passive exposure: Current   Smokeless tobacco: Never   Tobacco comments:    Some days smokes cigars but not frequent.   Vaping Use   Vaping Use: Never used  Substance and Sexual Activity   Alcohol use: Yes    Comment: seldom    Drug use: No   Sexual activity: Not on file  Other Topics Concern   Not on file  Social History Narrative   Not on file   Social Determinants of Health   Financial Resource  Strain: Not on file  Food Insecurity: Not on file  Transportation Needs: Not on file  Physical Activity: Not on file  Stress: Not on file  Social Connections: Not on file  Intimate Partner Violence: Not on file    Past Surgical History:  Procedure Laterality Date   BIOPSY  01/10/2021   Procedure: BIOPSY;  Surgeon: Montez Morita, Quillian Quince, MD;  Location: [facility];  Service: Gastroenterology;;   CARDIAC CATHETERIZATION  07-08-2007  dr Tami Ribas    no significant CAD, preserved LVF, ef 50%//  30% pLAD   CARDIOVASCULAR STRESS TEST  06-20-2011  dr croitoru   Low risk study/  normal perfusion scan showing attenuation artifact in the anterior region of myocardium with no ischemia , infarct or scar/  normal LV funciton and wall motion , ef 62%   COLONOSCOPY N/A 02/17/2013   Rehman:examination performed to cecum. mild sigmoid colon diverticulosis, 4 small polyps ablated via cold biopsy from rectosigmoid junction (hyperplastic). External hemorrhoids.   COLONOSCOPY WITH PROPOFOL N/A 01/10/2021   Procedure: COLONOSCOPY WITH PROPOFOL;  Surgeon: [name], MD;  [facility]: [facility];  Service: Gastroenterology;  Laterality: N/A;   CYSTOSCOPY W/ URETERAL STENT PLACEMENT Right 06/18/2015   Procedure: CYSTOSCOPY WITH RIGHT RETROGRADE PYELOGRAM RIGHT URETERAL STENT PLACEMENT;  Surgeon: [name], MD;  Location: [facility];  Service: Urology;  Laterality: Right;   CYSTOSCOPY/URETEROSCOPY/HOLMIUM LASER/STENT PLACEMENT Right 06/27/2015   Procedure: CYSTOSCOPY RIGHT URETEROSCOPY  STENT REMOVAL; STONE EXTRACTION WITH BASKET;  Surgeon: [name], MD;  Location: [facility];  Service: Urology;  Laterality: Right;   ESOPHAGOGASTRODUODENOSCOPY (EGD) WITH PROPOFOL N/A 01/10/2021   Procedure: ESOPHAGOGASTRODUODENOSCOPY (EGD) WITH PROPOFOL;  Surgeon: [name], MD;  Location: [facility];  Service: Gastroenterology;  Laterality: N/A;  11:00, moved up per [name] - pt  knows arrival time   [place] EXCISIONAL BIOPSY     HOLMIUM LASER APPLICATION Right 38/46/6599   Procedure: HOLMIUM LASER LITHOTRIPSY ;  Surgeon: [name], MD;  Location: [facility];  Service: Urology;  Laterality: Right;   I & D EXTREMITY Right 07/14/2018   Procedure: IRRIGATION AND DEBRIDEMENT RIGHT KNEE WITH CLOSURE;  Surgeon: Nicholes Stairs, MD;  Location: [place];  Service: Orthopedics;  Laterality: Right;   INGUINAL HERNIA REPAIR Left 10/21/2007   KNEE ARTHROSCOPY W/ ACL RECONSTRUCTION Right 1992   MENISCUS REPAIR Right    2019, dr Theda Sers    POLYPECTOMY  01/10/2021   Procedure: POLYPECTOMY;  Surgeon: [name], MD;  [facility];  Service: Gastroenterology;;   SHOULDER ARTHROSCOPY WITH OPEN ROTATOR CUFF REPAIR Left 07/27/2014   Procedure: LEFT SHOULDER ARTHROSCOPY WITH MINI OPEN ROTATOR CUFF REPAIR;  Surgeon: [name], MD;  Location: [place];  Service: Orthopedics;  Laterality: Left;   SHOULDER ARTHROSCOPY WITH OPEN ROTATOR CUFF REPAIR Right 2006   TONSILLECTOMY AND ADENOIDECTOMY  age 27   TOTAL KNEE ARTHROPLASTY Right 07/10/2018   Procedure: TOTAL KNEE ARTHROPLASTY;  Surgeon: [name], MD;  Location: [facility];  Service: Orthopedics;  Laterality: Right;   TRANSTHORACIC ECHOCARDIOGRAM  07/30/2007   normal LVF, ef >55%/  mild AR, MR , PR and TR/  mild AV sclerosis without stenosis/    URETEROSCOPY Right 06/18/2015   Procedure: URETEROSCOPY;  Surgeon: [name], MD;  Location: [facility];  Service: Urology;  Laterality: Right;      Current Outpatient Medications:    ALPRAZOLAM XR 1 MG 24 hr tablet, Take 1 mg by mouth at bedtime., Disp: , Rfl:    aspirin EC 81 MG tablet, Take 1 tablet (81 mg total) by mouth daily. Swallow whole., Disp: 90 tablet, Rfl: 3   atorvastatin (LIPITOR) 20 MG tablet, Take 20 mg by mouth daily., Disp: , Rfl:    calcium elemental as carbonate (TUMS ULTRA 1000) 400 MG chewable  tablet, Chew 2,000 mg by mouth daily as needed for heartburn., Disp: , Rfl:    Cholecalciferol (DIALYVITE VITAMIN D 5000) 125 MCG (5000 UT) capsule, Take 5,000 Units by mouth daily., Disp: , Rfl:    gabapentin (NEURONTIN) 300 MG capsule, Take 900 mg by mouth 4 (four) times daily., Disp: , Rfl:    levothyroxine (SYNTHROID, LEVOTHROID) 25 MCG tablet, Take 25 mcg by mouth daily before breakfast., Disp: , Rfl:    linaclotide (LINZESS) 145 MCG CAPS capsule, Take 145 mcg by mouth daily before breakfast., Disp: , Rfl:    LORazepam (ATIVAN) 0.5 MG tablet, Take 0.5-1 mg by mouth daily as needed for anxiety., Disp: , Rfl:    mirtazapine (REMERON) 15 MG tablet, Take 1 tablet (15 mg total) by mouth at bedtime., Disp: 30 tablet, Rfl: 2   nitroGLYCERIN (NITROSTAT) 0.4 MG SL tablet, Place 1 tablet (0.4 mg total) under the tongue every 5 (five) minutes as needed., Disp: 25 tablet, Rfl: 3   ondansetron (ZOFRAN-ODT) 8 MG disintegrating tablet, Take 1 tablet (8 mg total) by mouth every 8 (eight) hours as needed for nausea or vomiting., Disp: 20 tablet, Rfl: 1   Polyethyl Glycol-Propyl Glycol (SYSTANE ULTRA) 0.4-0.3 % SOLN, Place 2 drops into both eyes as needed (for dry eyes). , Disp: , Rfl:    polyethylene glycol (MIRALAX / GLYCOLAX) 17 g packet, Take 17 g by mouth 2 (two) times daily., Disp: , Rfl:    Probiotic Product (TRUBIOTICS PO), Take 1 capsule by mouth at bedtime., Disp: , Rfl:    rizatriptan (MAXALT-MLT) 10 MG disintegrating tablet, Take 10 mg by mouth as needed (for migraines and may repeat once in 2 hours, if no relief). , Disp: , Rfl:    vitamin k 100 MCG tablet, Take 100 mcg by mouth daily., Disp: , Rfl:     Physical Exam: Height '6\' 3"'$  (1.905 m), weight 198 lb (89.8 kg).    Affect appropriate Healthy:  appears stated age 40: normal Neck supple with no adenopathy JVP normal no bruits no thyromegaly Lungs clear with no wheezing and good diaphragmatic motion Heart:  S1/S2 SEM  murmur, no rub,  gallop or  click PMI normal Abdomen: benighn, BS positve, no tenderness, no AAA no bruit.  No HSM or HJR Distal pulses intact with no bruits No edema Neuro non-focal Skin warm and dry No muscular weakness   Labs:   Lab Results  Component Value Date   WBC 5.7 08/26/2021   HGB 16.5 08/26/2021   HCT 50.3 08/26/2021   MCV 97.5 08/26/2021   PLT 210 08/26/2021    No results for input(s): "NA", "K", "CL", "CO2", "BUN", "CREATININE", "CALCIUM", "PROT", "BILITOT", "ALKPHOS", "ALT", "AST", "GLUCOSE" in the last 168 hours.  Invalid input(s): "LABALBU"  Lab Results  Component Value Date   TROPONINI <0.03 08/01/2014    Lab Results  Component Value Date   CHOL 146 06/08/2021   CHOL 112 01/11/2021   Lab Results  Component Value Date   HDL 56 06/08/2021   HDL 48 01/11/2021   Lab Results  Component Value Date   LDLCALC 75 06/08/2021   LDLCALC 60 01/11/2021   Lab Results  Component Value Date   TRIG 76 06/08/2021   TRIG 22 01/11/2021   Lab Results  Component Value Date   CHOLHDL 2.6 06/08/2021   CHOLHDL 2.3 01/11/2021   No results found for: "LDLDIRECT"    Radiology: No results found.  EKG: 07/12/2022 SR RAD pulmonary dx pattern likely from tall stature normal otherwise    ASSESSMENT AND PLAN:   1. Chest Pain:  Normal cath in 2016 atypical pain with moderate LAD disease by cardiac CTA/FFR CT 03/22/20 Continue ASA/Statin  Not on beta blocker due to low resting HR And BP  Pain 01/11/21 appears to be related to his EGD with biopsy and GI w/u CTA negative.  Has had SSCP from GI/URI etiology not heart Myovue done 06/08/21 non ischemic normal EF and echo same day EF 60-65% Continue medical Rx   2. HLD:  On statin labs with primary   3. Murmur: 2/6 SEM AV sclerosis  by echo 06/08/21 stable   4. GI:  post EGD/colon 01/10/21 Barrett's and diverticulosis f/u GI constipation and weight loss  Weight up and appetite improved with Remeron and Linzess Also has PRN Zofran   F/U in a  year    Signed: Jenkins Rouge 07/12/2022, 11:18 AM

## 2022-07-06 ENCOUNTER — Encounter (INDEPENDENT_AMBULATORY_CARE_PROVIDER_SITE_OTHER): Payer: Self-pay

## 2022-07-08 ENCOUNTER — Telehealth (INDEPENDENT_AMBULATORY_CARE_PROVIDER_SITE_OTHER): Payer: Self-pay | Admitting: *Deleted

## 2022-07-08 NOTE — Telephone Encounter (Signed)
Discussed with patient and he wants to stick with dose he is doing now and the miralax and will let us know if he wants to try the 290 mcg.

## 2022-07-08 NOTE — Telephone Encounter (Signed)
Patient left voicemail over weekend that he was in extreme pain. Reports he has tried enema, linzess and miralax and wanted a call to see if he could take an extra linzess. I called patient this morning when I got his message and he said he is feeling better now. Thinks because he missed a few days of miralax he got constipated. He said he had diarrhea Saturday some and all day yesterday and his abdominal pain is a lot better.   [phone]

## 2022-07-10 NOTE — Telephone Encounter (Signed)
error 

## 2022-07-12 ENCOUNTER — Ambulatory Visit: Payer: Medicare Other | Attending: Cardiovascular Disease | Admitting: Cardiovascular Disease

## 2022-07-12 ENCOUNTER — Encounter: Payer: Self-pay | Admitting: Cardiovascular Disease

## 2022-07-12 VITALS — BP 126/84 | HR 65 | Ht 75.0 in | Wt 198.0 lb

## 2022-07-12 DIAGNOSIS — E782 Mixed hyperlipidemia: Secondary | ICD-10-CM

## 2022-07-12 DIAGNOSIS — I2510 Atherosclerotic heart disease of native coronary artery without angina pectoris: Secondary | ICD-10-CM

## 2022-07-12 DIAGNOSIS — R011 Cardiac murmur, unspecified: Secondary | ICD-10-CM

## 2022-07-12 NOTE — Patient Instructions (Signed)
Medication Instructions:  Your physician recommends that you continue on your current medications as directed. Please refer to the Current Medication list given to you today.   Labwork: None  Testing/Procedures: None  Follow-Up: Follow up with Dr. Nishan in 1 year.   Any Other Special Instructions Will Be Listed Below (If Applicable).     If you need a refill on your cardiac medications before your next appointment, please call your pharmacy.  

## 2022-07-16 ENCOUNTER — Other Ambulatory Visit (INDEPENDENT_AMBULATORY_CARE_PROVIDER_SITE_OTHER): Payer: Self-pay | Admitting: Gastroenterology

## 2022-07-16 DIAGNOSIS — R110 Nausea: Secondary | ICD-10-CM

## 2022-07-16 NOTE — Telephone Encounter (Signed)
Seen on 06/24/22 and note states zofran as needed for nausea

## 2022-07-17 ENCOUNTER — Encounter (INDEPENDENT_AMBULATORY_CARE_PROVIDER_SITE_OTHER): Payer: Self-pay

## 2022-07-24 ENCOUNTER — Other Ambulatory Visit (INDEPENDENT_AMBULATORY_CARE_PROVIDER_SITE_OTHER): Payer: Self-pay | Admitting: Gastroenterology

## 2022-07-31 IMAGING — DX DG CHEST 2V
2 series · 2 of 2 positions shown · non-contrast
Comparison: 11/02/2019

CLINICAL DATA: Colonoscopy today with subsequent chest pain.

EXAM:
CHEST - 2 VIEW

[chest pa]
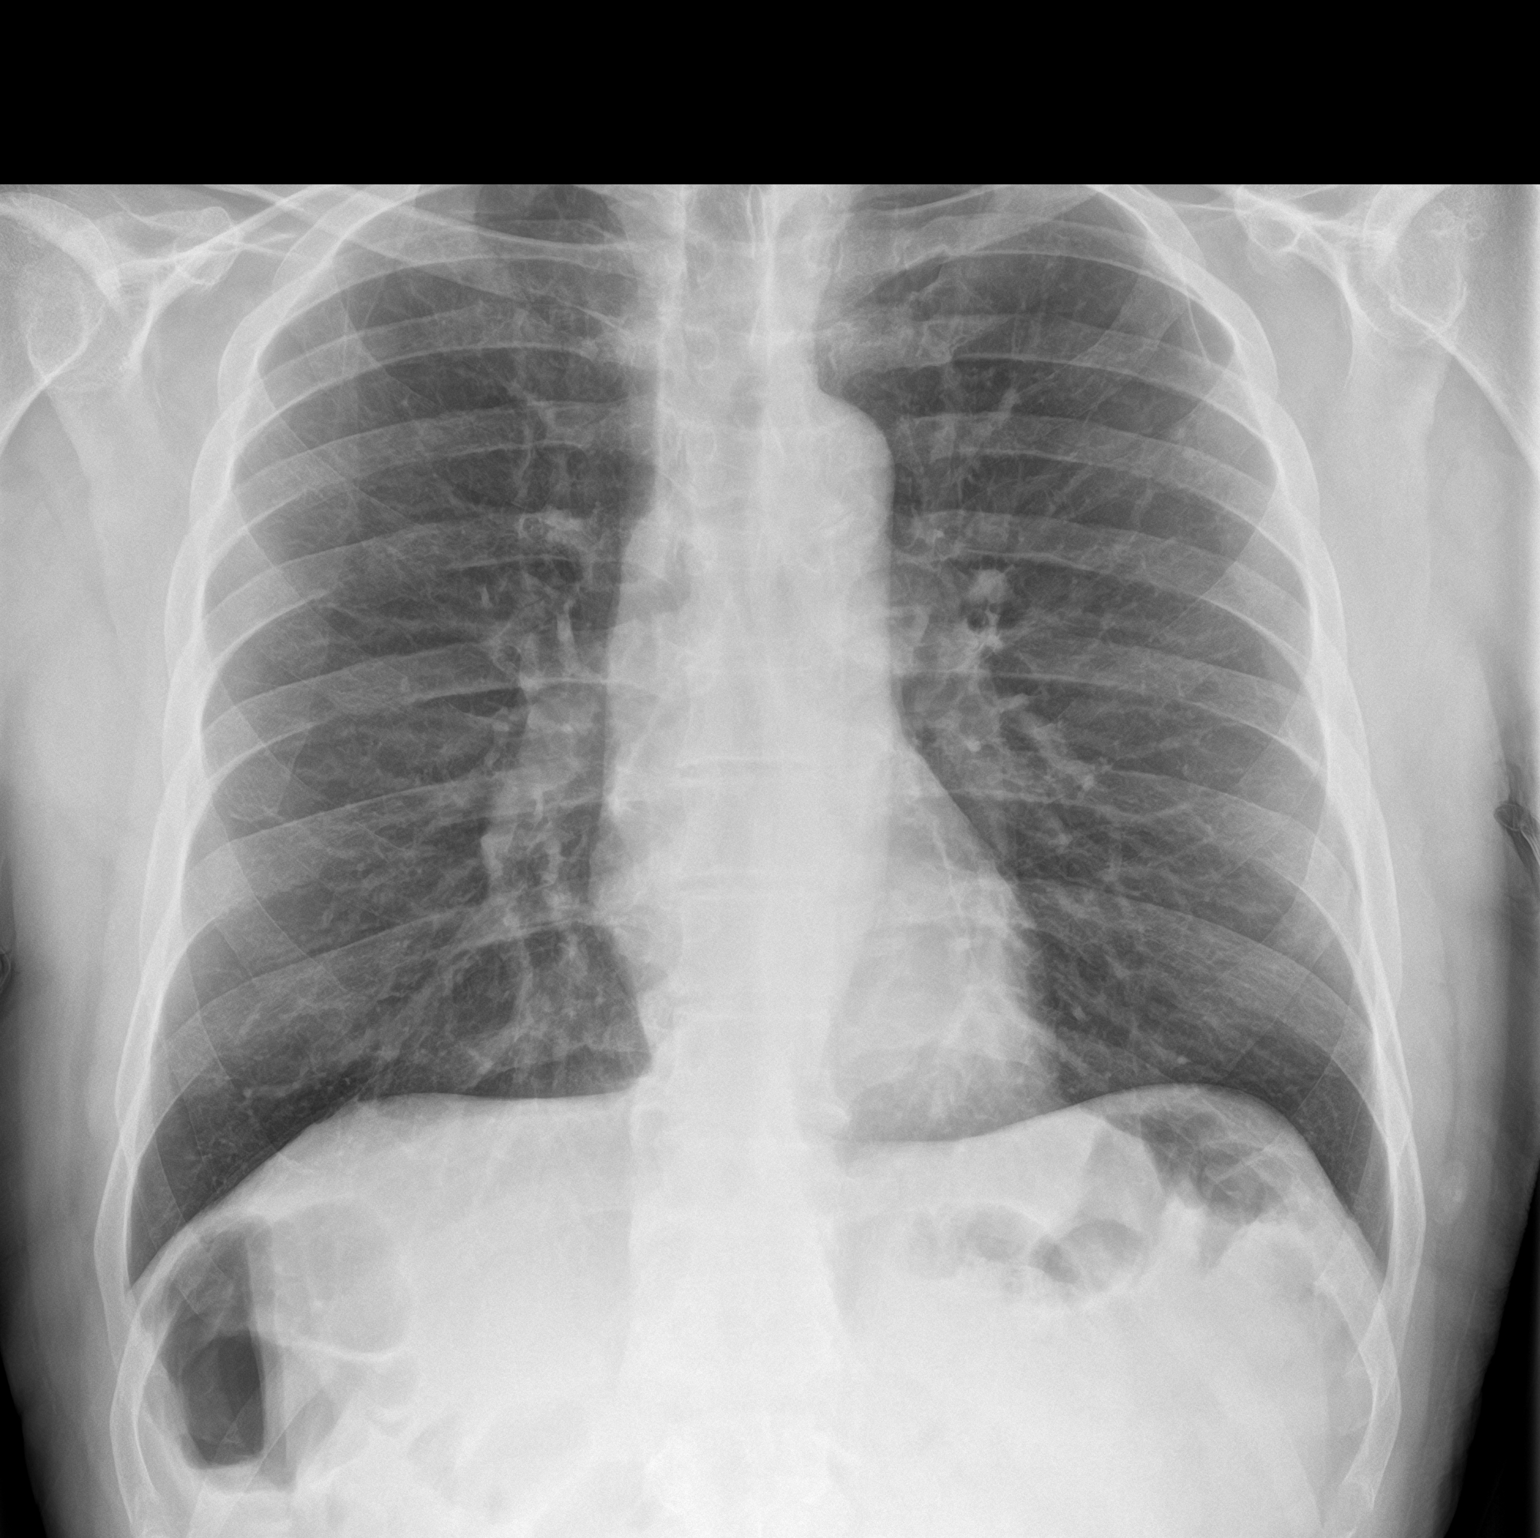

[chest lat]
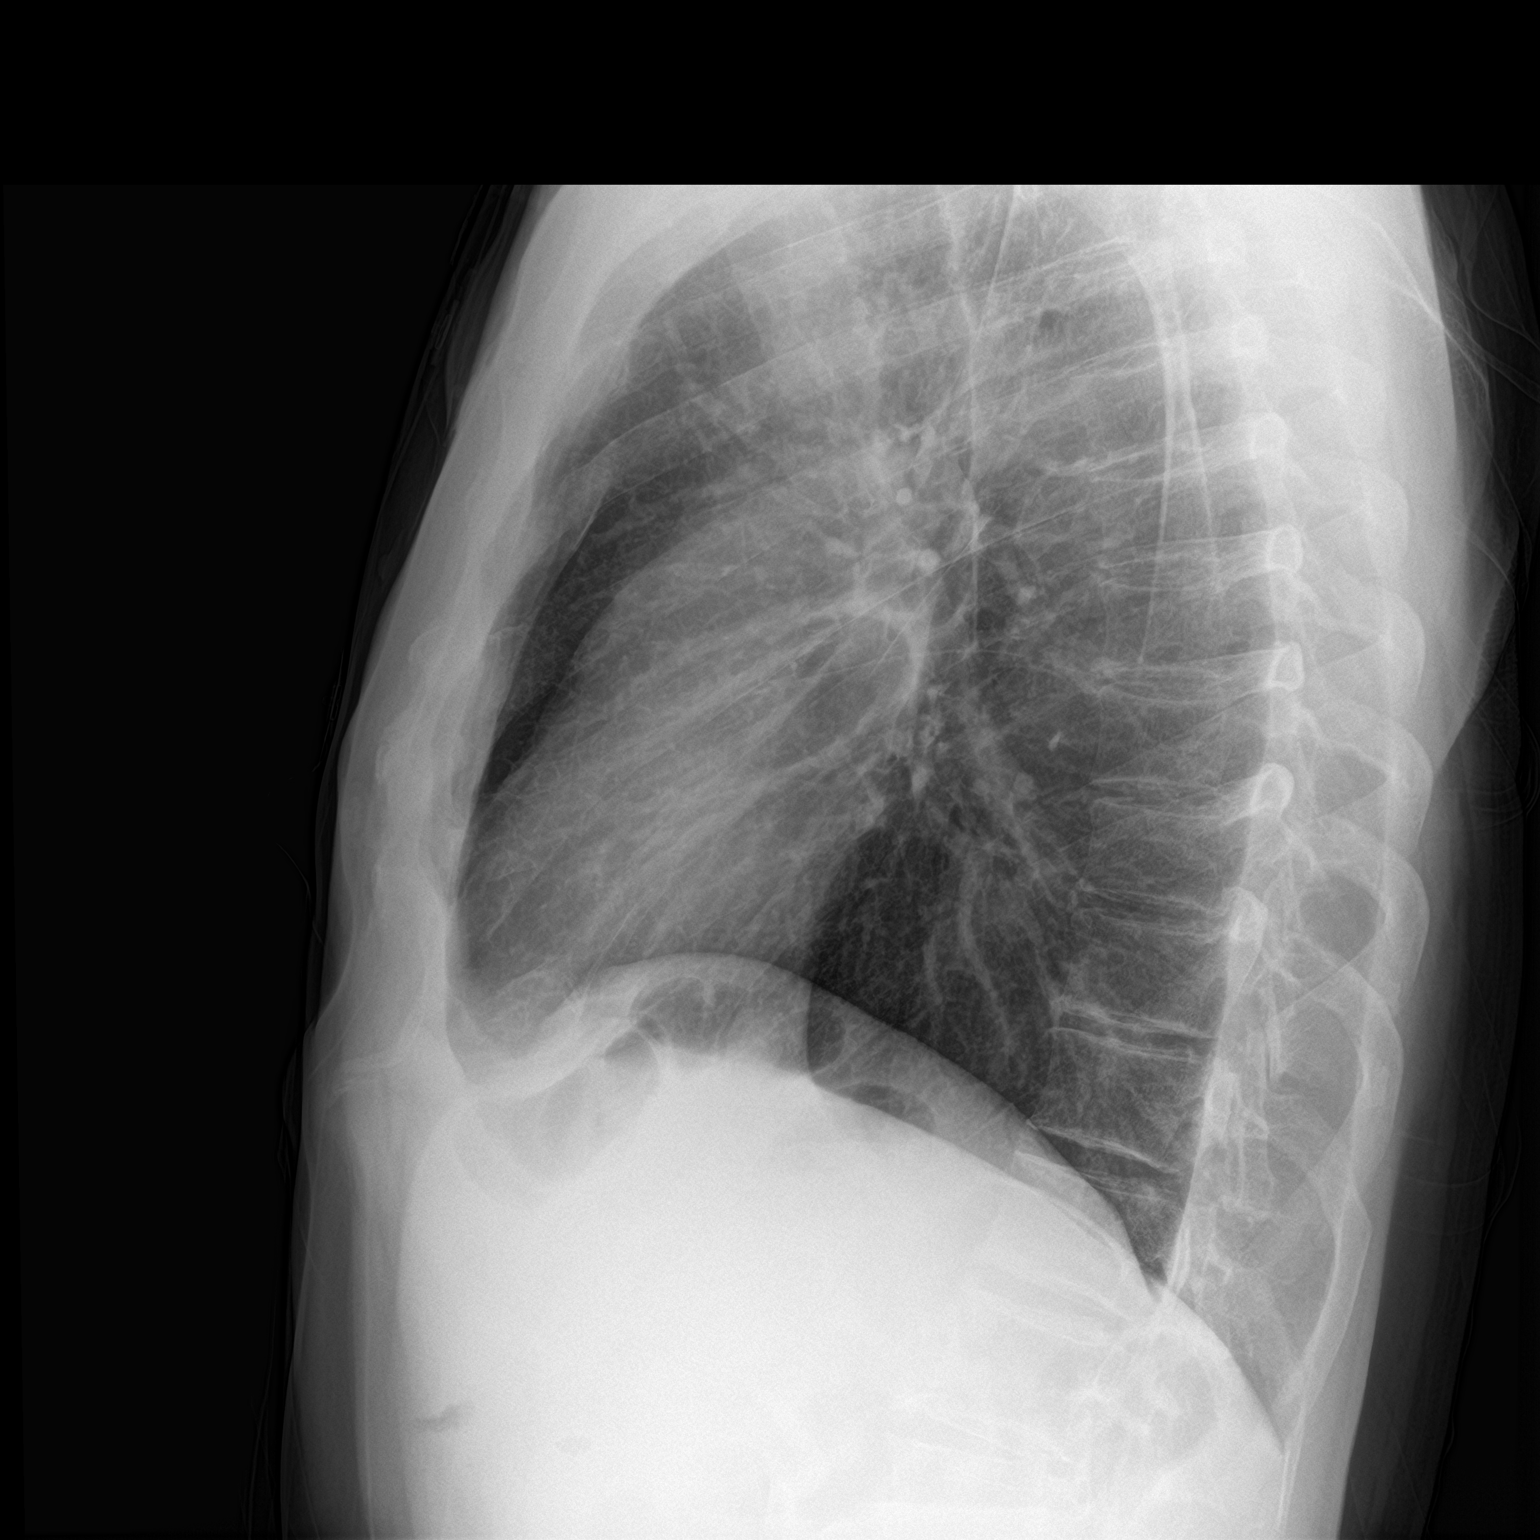

[2 of 2 positions shown; findings below may reference images not displayed]

FINDINGS: Heart and mediastinal shadows are normal except for mild aortic
atherosclerotic calcification. The lungs are clear. The vascularity
is normal. No free air seen under the diaphragm. No significant bone
finding.
IMPRESSION: No active cardiopulmonary disease.

## 2022-08-01 IMAGING — US US EXTREM LOW VENOUS
1 series · 13 of 24 positions shown · non-contrast
Comparison: None.

CLINICAL DATA: Bilateral lower extremity pain.



[Series 1: us venous img lower bilat (dvt) · portal-venous · 13 of 73 slices shown]
[im 1/73]
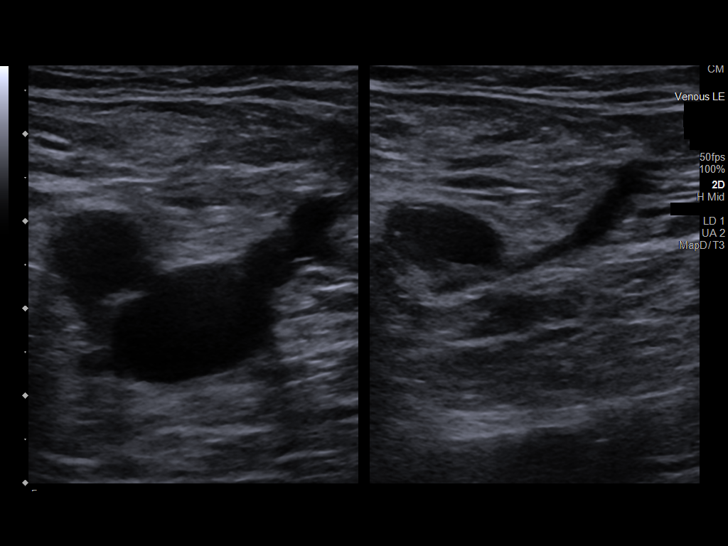
[im 7/73]
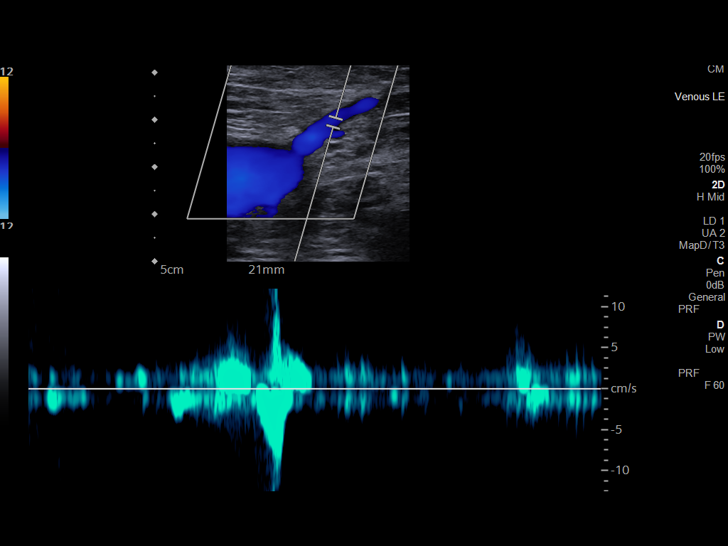
[im 13/73]
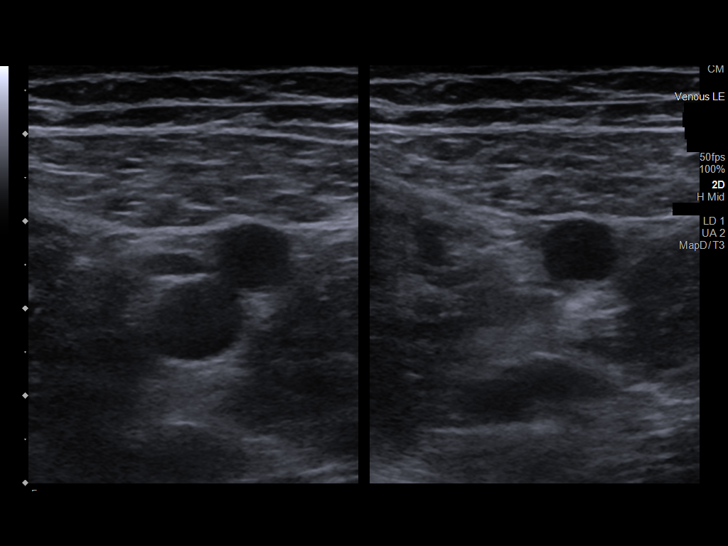
[im 19/73]
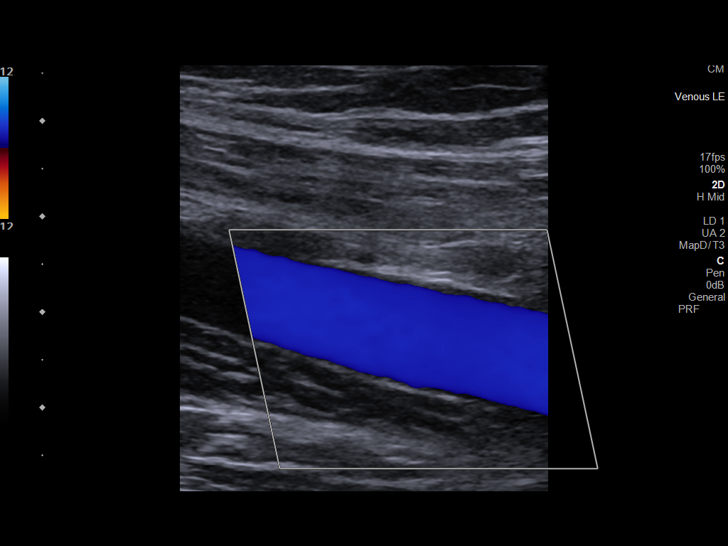
[im 26/73]
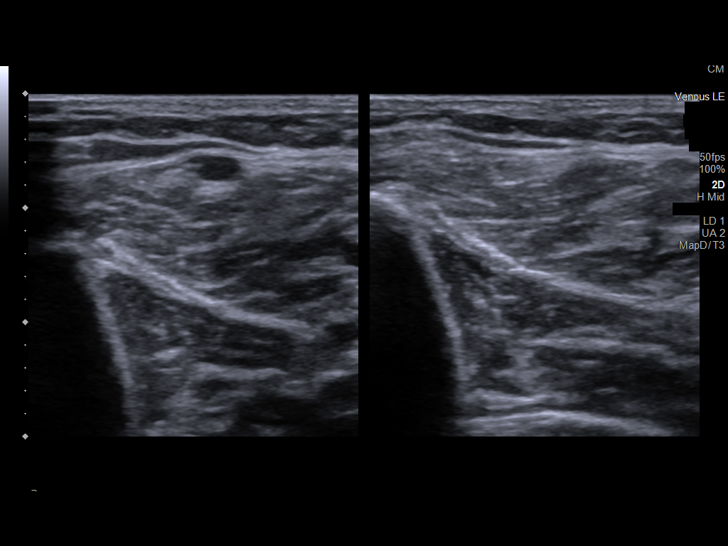
[im 32/73]
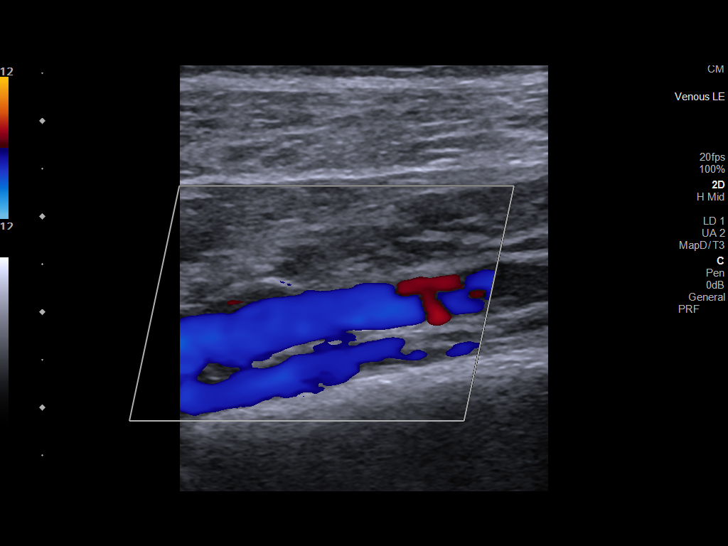
[im 38/73]
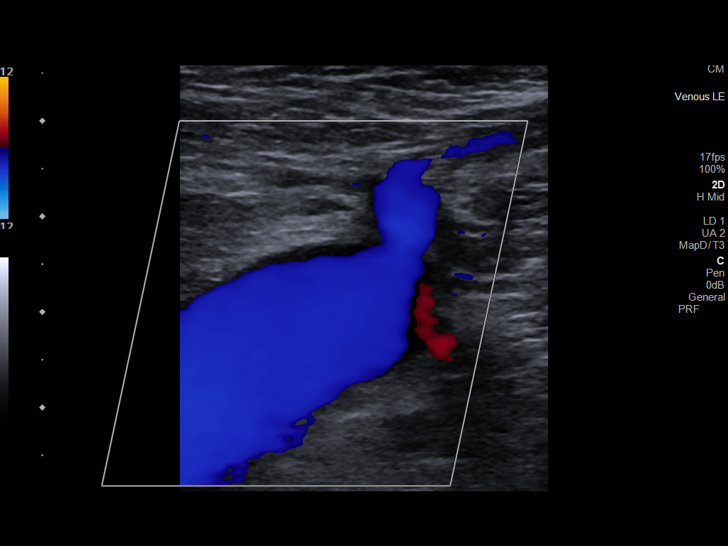
[im 41/73]
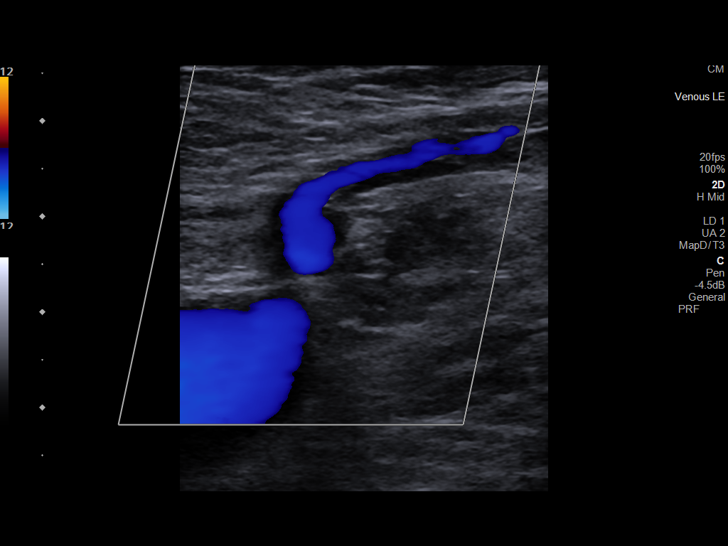
[im 47/73]
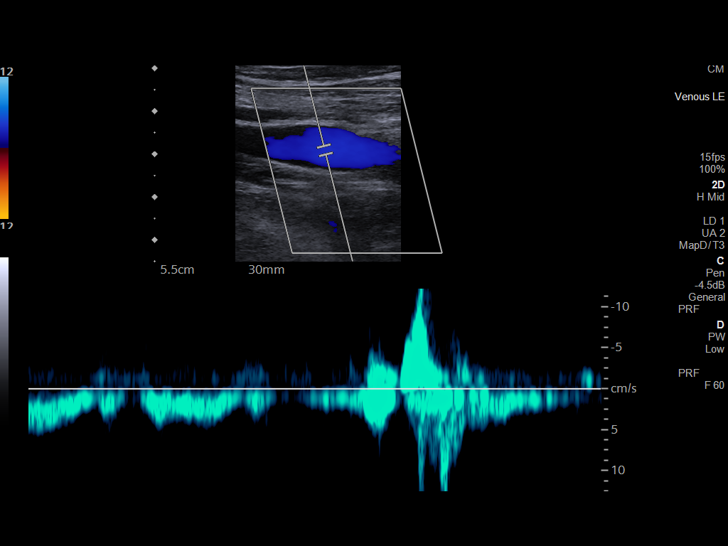
[im 54/73]
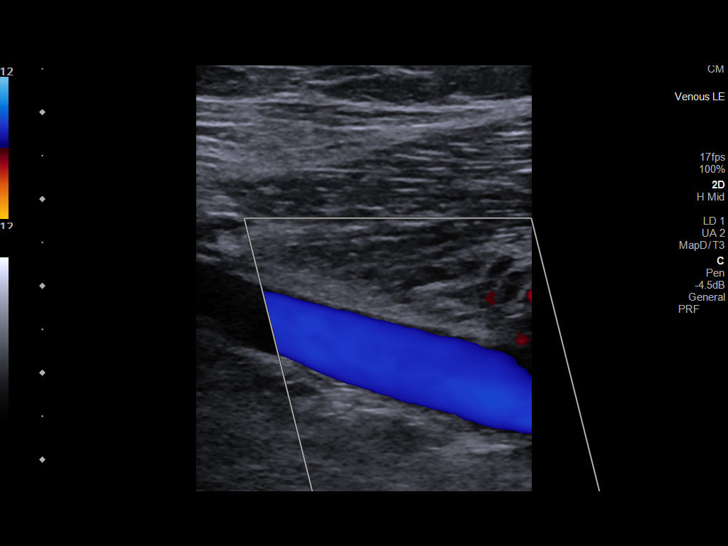
[im 60/73]
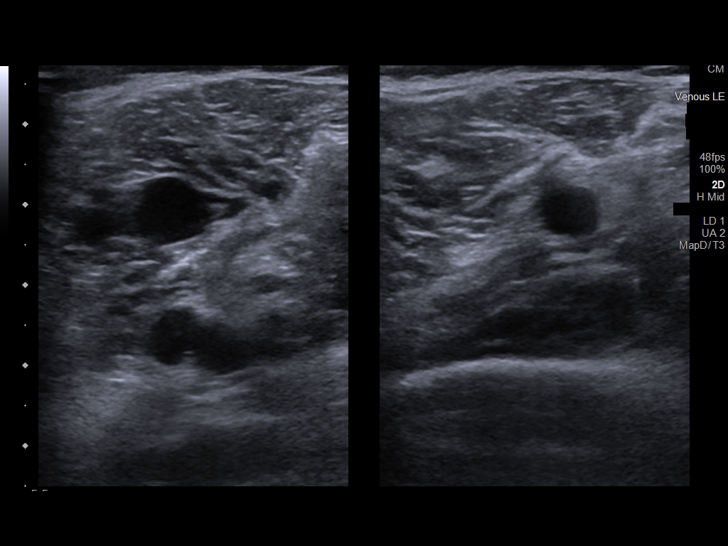
[im 66/73]
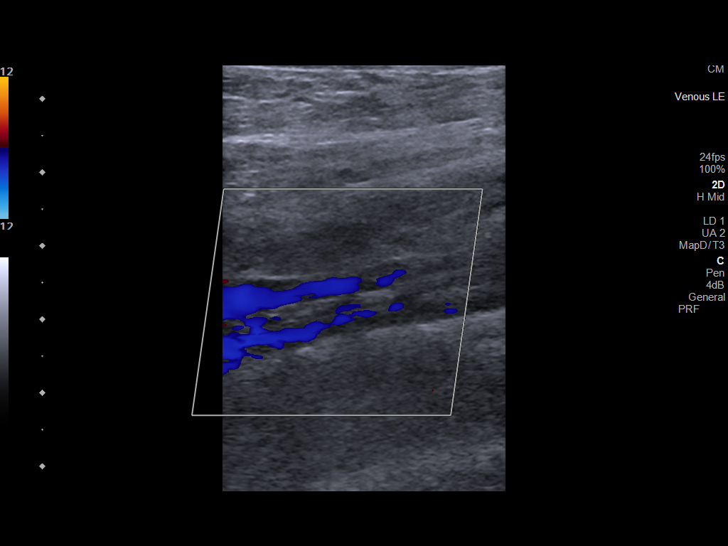
[im 73/73]
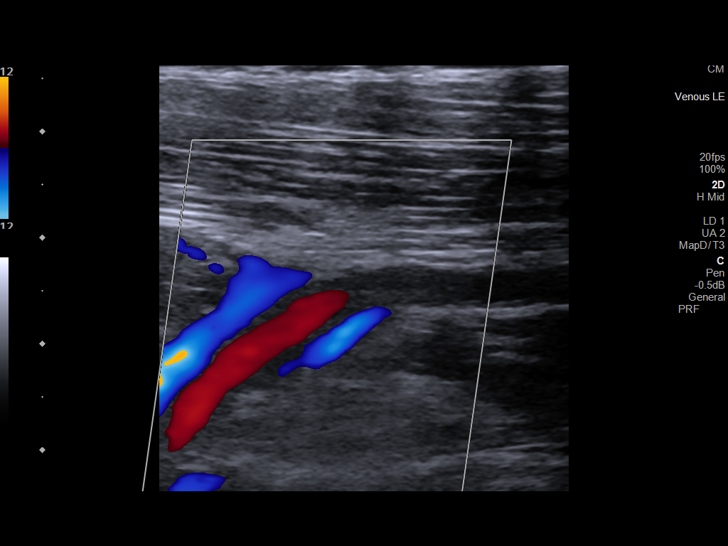

[13 of 24 positions shown; findings below may reference images not displayed]

FINDINGS: RIGHT LOWER EXTREMITY

Common Femoral Vein: No evidence of thrombus. Normal
compressibility, respiratory phasicity and response to augmentation.

Saphenofemoral Junction: No evidence of thrombus. Normal
compressibility and flow on color Doppler imaging.

Profunda Femoral Vein: No evidence of thrombus. Normal
compressibility and flow on color Doppler imaging.

Femoral Vein: No evidence of thrombus. Normal compressibility,
respiratory phasicity and response to augmentation.

Popliteal Vein: No evidence of thrombus. Normal compressibility,
respiratory phasicity and response to augmentation.

Calf Veins: No evidence of thrombus. Normal compressibility and flow
on color Doppler imaging.

Superficial Great Saphenous Vein: No evidence of thrombus. Normal
compressibility.

Venous Reflux:  None.

Other Findings: No evidence of superficial thrombophlebitis or
abnormal fluid collection.

LEFT LOWER EXTREMITY

Common Femoral Vein: No evidence of thrombus. Normal
compressibility, respiratory phasicity and response to augmentation.

Saphenofemoral Junction: No evidence of thrombus. Normal
compressibility and flow on color Doppler imaging.

Profunda Femoral Vein: No evidence of thrombus. Normal
compressibility and flow on color Doppler imaging.

Femoral Vein: No evidence of thrombus. Normal compressibility,
respiratory phasicity and response to augmentation.

Popliteal Vein: No evidence of thrombus. Normal compressibility,
respiratory phasicity and response to augmentation.

Calf Veins: No evidence of thrombus. Normal compressibility and flow
on color Doppler imaging.

Superficial Great Saphenous Vein: No evidence of thrombus. Normal
compressibility.

Venous Reflux:  None.

Other Findings: No evidence of superficial thrombophlebitis or
abnormal fluid collection.
IMPRESSION: No evidence of deep venous thrombosis in either lower extremity.

## 2022-09-02 ENCOUNTER — Other Ambulatory Visit (HOSPITAL_COMMUNITY): Payer: Self-pay | Admitting: Internal Medicine

## 2022-09-02 DIAGNOSIS — R9389 Abnormal findings on diagnostic imaging of other specified body structures: Secondary | ICD-10-CM

## 2022-09-16 DIAGNOSIS — Z85828 Personal history of other malignant neoplasm of skin: Secondary | ICD-10-CM | POA: Diagnosis not present

## 2022-09-16 DIAGNOSIS — L570 Actinic keratosis: Secondary | ICD-10-CM | POA: Diagnosis not present

## 2022-09-18 ENCOUNTER — Other Ambulatory Visit (INDEPENDENT_AMBULATORY_CARE_PROVIDER_SITE_OTHER): Payer: Self-pay | Admitting: Gastroenterology

## 2022-09-19 NOTE — Telephone Encounter (Signed)
Last seen 06/24/22 next appt 11/25/22

## 2022-09-20 DIAGNOSIS — N183 Chronic kidney disease, stage 3 unspecified: Secondary | ICD-10-CM | POA: Diagnosis not present

## 2022-09-20 DIAGNOSIS — E663 Overweight: Secondary | ICD-10-CM | POA: Diagnosis not present

## 2022-09-20 DIAGNOSIS — Z6825 Body mass index (BMI) 25.0-25.9, adult: Secondary | ICD-10-CM | POA: Diagnosis not present

## 2022-09-20 DIAGNOSIS — F419 Anxiety disorder, unspecified: Secondary | ICD-10-CM | POA: Diagnosis not present

## 2022-09-20 DIAGNOSIS — A6922 Other neurologic disorders in Lyme disease: Secondary | ICD-10-CM | POA: Diagnosis not present

## 2022-09-20 DIAGNOSIS — N400 Enlarged prostate without lower urinary tract symptoms: Secondary | ICD-10-CM | POA: Diagnosis not present

## 2022-09-20 DIAGNOSIS — I700 Atherosclerosis of aorta: Secondary | ICD-10-CM | POA: Diagnosis not present

## 2022-10-22 ENCOUNTER — Ambulatory Visit (HOSPITAL_COMMUNITY)
Admission: RE | Admit: 2022-10-22 | Discharge: 2022-10-22 | Disposition: A | Discharge status: Home / Self Care | Payer: Medicare Other | Source: Ambulatory Visit | Attending: Internal Medicine | Admitting: Internal Medicine

## 2022-10-22 DIAGNOSIS — R918 Other nonspecific abnormal finding of lung field: Secondary | ICD-10-CM | POA: Diagnosis not present

## 2022-10-22 DIAGNOSIS — R9389 Abnormal findings on diagnostic imaging of other specified body structures: Secondary | ICD-10-CM | POA: Diagnosis not present

## 2022-11-11 ENCOUNTER — Encounter (INDEPENDENT_AMBULATORY_CARE_PROVIDER_SITE_OTHER): Payer: Self-pay | Admitting: *Deleted

## 2022-11-25 ENCOUNTER — Ambulatory Visit (INDEPENDENT_AMBULATORY_CARE_PROVIDER_SITE_OTHER): Payer: Medicare Other | Admitting: Gastroenterology

## 2022-11-25 ENCOUNTER — Encounter (INDEPENDENT_AMBULATORY_CARE_PROVIDER_SITE_OTHER): Payer: Self-pay | Admitting: Gastroenterology

## 2022-11-25 VITALS — BP 128/79 | HR 57 | Temp 97.5°F | Ht 75.0 in | Wt 211.5 lb

## 2022-11-25 DIAGNOSIS — K582 Mixed irritable bowel syndrome: Secondary | ICD-10-CM

## 2022-11-25 DIAGNOSIS — D126 Benign neoplasm of colon, unspecified: Secondary | ICD-10-CM | POA: Diagnosis not present

## 2022-11-25 MED ORDER — MIRTAZAPINE 15 MG PO TABS: 15.0000 mg | ORAL_TABLET | Freq: Every day | ORAL | 1 refills | Status: DC

## 2022-11-25 MED ORDER — PEG 3350-KCL-NA BICARB-NACL 420 G PO SOLR: 4000.0000 mL | Freq: Once | ORAL | 0 refills | Status: AC

## 2022-11-25 NOTE — Progress Notes (Addendum)
Referring Provider: Elfredia Nevins, MD Primary Care Physician:  Elfredia Nevins, MD Primary GI Physician: Levon Hedger   Chief Complaint  Patient presents with   Diarrhea    Patient here today due to issues with constipation alternating with diarrhea. Patient on linzess 145 mg once per day. Patient says he has had a couple of episode of explosive diarrhea and fecal sepage. Received a letter that he needed a tcs and has returned the form.    HPI:   Frank Weber is a 72 y.o. male with past medical history of  GERD, hypothyroidism, HLD, peripheral neuropathy, and IBS-C    Patient presenting today for follow up of IBS-M   Last seen January 2024, at that time constipation is doing better on linzess daily, was doing miralax but started having some diarrhea. having a BM daily for the most part.  does not drink much water, mostly coffee and tea. eating a decent amount of veggies. Has had some occasional LLQ pain,  appetite is better since starting remeron.  Still has some nausea, though decreased in frequency, maybe only twice per week now, taking zofran maybe twice a week with good results.   Recommended to continue with linzess daily, low fodmap, miralax PRN, zofran MRN. Increase water, continue with remeron 15mg  at bedtime, colonoscopy August 2024  Present:  Doing better but reports he ranges from diarrhea to constipation, sometimes has fecal urgency, sometimes normal formed stools. Had two episodes where he did not make it to the restroom. He has only had two episodes of significant urge with fecal incontinence. He feels like he has more formed stools with some need to strain, vs looser/diarrhea. Most of the time he feels that stools are fairly normal/manageable. He has some abdominal pain at times, usually in LLQ, pain usually improved with defecation. He is still doing well with remeron, feels that it helps. Appetite is good. He has actually gained some weight. He has tried to  increase his water intake, but still doing a lot of coffee and tea. No rectal bleeding or melena. He is not doing miralax anymore as it makes his bowels so unpredictable. Took benefiber in the past but did not notice much improvement.    CT chest abdomen pelvis with contrast 02/27/2022: -No acute findings within the chest, abdomen, or pelvis -Small nonspecific pulmonary nodules present, no need for follow-up imaging if low risk -Moderate stool burden within the colon -Sigmoid diverticulosis without diverticulitis Last EGD 01/2021 - Salmon-colored mucosa suspicious for short-segment Barrett's esophagus. Biopsied - reflux changes. - Erythematous mucosa in the antrum. Biopsied - reactive gastropathy neg for HP. - Normal examined duodenum.  Last Colonoscopy: 01/2021 - Two 2 mm polyps in the ascending colon, removed with a cold biopsy forceps. Resected and retrieved. Path TA x2. - Nine 3 to 8 mm polyps in the descending colon, in the transverse colon and in the ascending colon, removed with a cold snare. Resected and retrieved. Path 8/9 tubular adenomas - Diverticulosis in the sigmoid colon and in the descending colon. - Non-bleeding internal hemorrhoids.   Repeat TCS August 2024   Past Medical History:  Diagnosis Date   Arthritis    right knee   Cancer (HCC)    Skin   Constipation    Frequency of urination    GERD (gastroesophageal reflux disease)    Heart murmur    "slight"    History of kidney stones    History of melanoma excision    2012--  NECK/ HEAD   History of squamous cell carcinoma excision    RIGHT EAR   Hyperlipidemia    Hypothyroidism    IBS (irritable bowel syndrome)    states slight   Lumbar stenosis    L5 - S1   Migraines    Peripheral neuropathy    legs and feet   Renal insufficiency    Right flank pain    Right ureteral stone    Urgency of urination    Wears glasses     Past Surgical History:  Procedure Laterality Date   BIOPSY  01/10/2021   Procedure:  BIOPSY;  Surgeon: Marguerita Merles, Reuel Boom, MD;  Location: [facility];  Service: Gastroenterology;;   CARDIAC CATHETERIZATION  07-08-2007  dr Jenne Campus    no significant CAD, preserved LVF, ef 50%//  30% pLAD   CARDIOVASCULAR STRESS TEST  06-20-2011  dr croitoru   Low risk study/  normal perfusion scan showing attenuation artifact in the anterior region of myocardium with no ischemia , infarct or scar/  normal LV funciton and wall motion , ef 62%   COLONOSCOPY N/A 02/17/2013   Rehman:examination performed to cecum. mild sigmoid colon diverticulosis, 4 small polyps ablated via cold biopsy from rectosigmoid junction (hyperplastic). External hemorrhoids.   COLONOSCOPY WITH PROPOFOL N/A 01/10/2021   Procedure: COLONOSCOPY WITH PROPOFOL;  Surgeon: Dolores Frame, MD;  Location: [facility];  Service: Gastroenterology;  Laterality: N/A;   CYSTOSCOPY W/ URETERAL STENT PLACEMENT Right 06/18/2015   Procedure: CYSTOSCOPY WITH RIGHT RETROGRADE PYELOGRAM RIGHT URETERAL STENT PLACEMENT;  Surgeon: Bjorn Pippin, MD;  Location: [facility];  Service: Urology;  Laterality: Right;   CYSTOSCOPY/URETEROSCOPY/HOLMIUM LASER/STENT PLACEMENT Right 06/27/2015   Procedure: CYSTOSCOPY RIGHT URETEROSCOPY  STENT REMOVAL; STONE EXTRACTION WITH BASKET;  Surgeon: Bjorn Pippin, MD;  Location: [facility];  Service: Urology;  Laterality: Right;   ESOPHAGOGASTRODUODENOSCOPY (EGD) WITH PROPOFOL N/A 01/10/2021   Procedure: ESOPHAGOGASTRODUODENOSCOPY (EGD) WITH PROPOFOL;  Surgeon: Dolores Frame, MD;  Location: [facility];  Service: Gastroenterology;  Laterality: N/A;  11:00, moved up per Soledad Gerlach - pt knows arrival time   HEAD & NECK SKIN LESION EXCISIONAL BIOPSY     HOLMIUM LASER APPLICATION Right 06/27/2015   Procedure: HOLMIUM LASER LITHOTRIPSY ;  Surgeon: Bjorn Pippin, MD;  Location[facility];  Service: Urology;  Laterality: Right;   I & D EXTREMITY Right 07/14/2018   Procedure:  IRRIGATION AND DEBRIDEMENT RIGHT KNEE WITH CLOSURE;  Surgeon: Yolonda Kida, MD;  Location: [facility];  Service: Orthopedics;  Laterality: Right;   INGUINAL HERNIA REPAIR Left 10/21/2007   KNEE ARTHROSCOPY W/ ACL RECONSTRUCTION Right 1992   MENISCUS REPAIR Right    2019, dr Thomasena Edis    POLYPECTOMY  01/10/2021   Procedure: POLYPECTOMY;  Surgeon: Dolores Frame, MD;  [facility]: [facility];  Service: Gastroenterology;;   SHOULDER ARTHROSCOPY WITH OPEN ROTATOR CUFF REPAIR Left 07/27/2014   Procedure: LEFT SHOULDER ARTHROSCOPY WITH MINI OPEN ROTATOR CUFF REPAIR;  Surgeon: Nestor Lewandowsky, MD;  Location: [facility];  Service: Orthopedics;  Laterality: Left;   SHOULDER ARTHROSCOPY WITH OPEN ROTATOR CUFF REPAIR Right 2006   TONSILLECTOMY AND ADENOIDECTOMY  age 72   TOTAL KNEE ARTHROPLASTY Right 07/10/2018   Procedure: TOTAL KNEE ARTHROPLASTY;  Surgeon: Eugenia Mcalpine, MD;  Location: [facility];  Service: Orthopedics;  Laterality: Right;   TRANSTHORACIC ECHOCARDIOGRAM  07/30/2007   normal LVF, ef >55%/  mild AR, MR , PR and TR/  mild  AV sclerosis without stenosis/    URETEROSCOPY Right 06/18/2015   Procedure: URETEROSCOPY;  Surgeon: Bjorn Pippin, MD;  Location: [facility];  Service: Urology;  Laterality: Right;    Current Outpatient Medications  Medication Sig Dispense Refill   ALPRAZOLAM XR 1 MG 24 hr tablet Take 1 mg by mouth at bedtime.     aspirin EC 81 MG tablet Take 1 tablet (81 mg total) by mouth daily. Swallow whole. 90 tablet 3   atorvastatin (LIPITOR) 20 MG tablet Take 20 mg by mouth daily.     calcium elemental as carbonate (TUMS ULTRA 1000) 400 MG chewable tablet Chew 2,000 mg by mouth daily as needed for heartburn.     Cholecalciferol (DIALYVITE VITAMIN D 5000) 125 MCG (5000 UT) capsule Take 5,000 Units by mouth daily.     gabapentin (NEURONTIN) 300 MG capsule Take 900 mg by mouth 4 (four) times daily.     levothyroxine (SYNTHROID, LEVOTHROID) 25 MCG tablet Take 25  mcg by mouth daily before breakfast.     linaclotide (LINZESS) 145 MCG CAPS capsule Take 145 mcg by mouth daily before breakfast.     LORazepam (ATIVAN) 0.5 MG tablet Take 0.5-1 mg by mouth daily as needed for anxiety.     mirtazapine (REMERON) 15 MG tablet TAKE ONE TABLET BY MOUTH AT BEDTIME 60 tablet 0   nitroGLYCERIN (NITROSTAT) 0.4 MG SL tablet Place 1 tablet (0.4 mg total) under the tongue every 5 (five) minutes as needed. 25 tablet 3   ondansetron (ZOFRAN) 4 MG tablet TAKE (1) TABLET BY MOUTH EVERY EIGHT HOURS AS NEEDED FOR NAUSEA 20 tablet 1   Polyethyl Glycol-Propyl Glycol (SYSTANE ULTRA) 0.4-0.3 % SOLN Place 2 drops into both eyes as needed (for dry eyes).      polyethylene glycol (MIRALAX / GLYCOLAX) 17 g packet Take 17 g by mouth 2 (two) times daily.     Probiotic Product (TRUBIOTICS PO) Take 1 capsule by mouth at bedtime.     rizatriptan (MAXALT-MLT) 10 MG disintegrating tablet Take 10 mg by mouth as needed (for migraines and may repeat once in 2 hours, if no relief).      vitamin k 100 MCG tablet Take 100 mcg by mouth daily.     No current facility-administered medications for this visit.    Allergies as of 11/25/2022 - Review Complete 11/25/2022  Allergen Reaction Noted   Sulfa antibiotics Anaphylaxis, Shortness Of Breath, and Rash 07/20/2014   Coconut (cocos nucifera) Rash 07/20/2014    Family History  Problem Relation Age of Onset   Colon cancer Other        Age of Onset-late 22's    Social History   Socioeconomic History   Marital status: Married    Spouse name: Not on file   Number of children: Not on file   Years of education: Not on file   Highest education level: Not on file  Occupational History   Not on file  Tobacco Use   Smoking status: Some Days    Types: Cigars    Passive exposure: Current   Smokeless tobacco: Never   Tobacco comments:    Some days smokes cigars but not frequent.   Vaping Use   Vaping Use: Never used  Substance and Sexual  Activity   Alcohol use: Yes    Comment: seldom    Drug use: No   Sexual activity: Not on file  Other Topics Concern   Not on file  Social History Narrative   Not  on file   Social Determinants of Health   Financial Resource Strain: Not on file  Food Insecurity: Not on file  Transportation Needs: Not on file  Physical Activity: Not on file  Stress: Not on file  Social Connections: Not on file   Review of systems General: negative for malaise, night sweats, fever, chills, weight loss Neck: Negative for lumps, goiter, pain and significant neck swelling Resp: Negative for cough, wheezing, dyspnea at rest CV: Negative for chest pain, leg swelling, palpitations, orthopnea GI: denies melena, hematochezia, nausea, vomiting, constipation, dysphagia, odyonophagia, early satiety or unintentional weight loss. +fecal urgency/incontinence  MSK: Negative for joint pain or swelling, back pain, and muscle pain. Derm: Negative for itching or rash Psych: Denies depression, anxiety, memory loss, confusion. No homicidal or suicidal ideation.  Heme: Negative for prolonged bleeding, bruising easily, and swollen nodes. Endocrine: Negative for cold or heat intolerance, polyuria, polydipsia and goiter. Neuro: negative for tremor, gait imbalance, syncope and seizures. The remainder of the review of systems is noncontributory.  Physical Exam: BP 128/79 (BP Location: Left Arm, Patient Position: Sitting, Cuff Size: Normal)   Pulse (!) 57   Temp (!) 97.5 F (36.4 C) (Temporal)   Ht 6\' 3"  (1.905 m)   Wt 211 lb 8 oz (95.9 kg)   BMI 26.44 kg/m  General:   Alert and oriented. No distress noted. Pleasant and cooperative.  Head:  Normocephalic and atraumatic. Eyes:  Conjuctiva clear without scleral icterus. Mouth:  Oral mucosa pink and moist. Good dentition. No lesions. Heart: Normal rate and rhythm, s1 and s2 heart sounds present.  Lungs: Clear lung sounds in all lobes. Respirations equal and  unlabored. Abdomen:  +BS, soft, non-tender and non-distended. No rebound or guarding. No HSM or masses noted. Derm: No palmar erythema or jaundice Msk:  Symmetrical without gross deformities. Normal posture. Extremities:  Without edema. Neurologic:  Alert and  oriented x4 Psych:  Alert and cooperative. Normal mood and affect.  Invalid input(s): "6 MONTHS"   ASSESSMENT: Frank Weber is a 72 y.o. male presenting today for follow up of IBS-M   IBS-M: doing relatively well on linzess daily, feels that he is much better on this medication than previously. Still has a range of looser stools to more hard stools, 2 episodes of fecal urgency/incontinence but overall has mostly normal stools. Some LLQ pain usually improved with defecation. Notes he has increased water intake but probably not drinking as much as he should. Fiber supplement in the past did not provide much help to him.  We will continue on linzess 145 mcg daily, encouraged to aim for 64 oz water per day, diet high in fruits, veggies, whole grains.  Nausea/weight loss: He was started on Remeron 15mg  at bedtime previously and feels this has made a huge difference for him, he has no side effects of the medication, appetite has improved and he has gained some weight. We will continue on remeron 15mg  at bedtime for now as he has seen significant improvement in his symptoms.   Last colonoscopy in 2022 with 10 tubular adenomas, advised to repeat in 2 years. We will get him scheduled for colonoscopy. Indications, risks and benefits of procedure discussed in detail with patient. Patient verbalized understanding and is in agreement to proceed with Colonoscopy.    PLAN:  Schedule colonoscopy-ASA II  2.  Continue with linzess daily  3. Continue with remeron 15mg  at bedtime 4. Good water intake,aim for 64oz/day 5. Diet high in fruits, veggies,  whole grains  All questions were answered, patient verbalized understanding and is in  agreement with plan as outlined above.    Follow Up: 6 months   Berda Shelvin L. Jeanmarie Hubert, MSN, APRN, AGNP-C Adult-Gerontology Nurse Practitioner [facility]  I have reviewed the note and agree with the APP's assessment as described in this progress note  Katrinka Blazing, MD Gastroenterology and Hepatology [facility]

## 2022-11-25 NOTE — Patient Instructions (Signed)
We will continue with linzess daily and remeron 15mg  at night (refill sent of this) We will get you scheduled for colonoscopy aim for atleast 64 oz per day of water  Increase fruits, veggies and whole grains, kiwi and prunes are especially good for constipation  Follow up 6 months  It was a pleasure to see you today. I want to create trusting relationships with patients and provide genuine, compassionate, and quality care. I truly value your feedback! please be on the lookout for a survey regarding your visit with me today. I appreciate your input about our visit and your time in completing this!    Davionte Lusby L. Jeanmarie Hubert, MSN, APRN, AGNP-C Adult-Gerontology Nurse Practitioner [facility] at [facility]

## 2022-12-27 DIAGNOSIS — L42 Pityriasis rosea: Secondary | ICD-10-CM | POA: Diagnosis not present

## 2022-12-27 DIAGNOSIS — Z6825 Body mass index (BMI) 25.0-25.9, adult: Secondary | ICD-10-CM | POA: Diagnosis not present

## 2022-12-27 DIAGNOSIS — F419 Anxiety disorder, unspecified: Secondary | ICD-10-CM | POA: Diagnosis not present

## 2022-12-30 ENCOUNTER — Encounter (INDEPENDENT_AMBULATORY_CARE_PROVIDER_SITE_OTHER): Payer: Self-pay

## 2023-01-01 ENCOUNTER — Ambulatory Visit (HOSPITAL_COMMUNITY): Payer: Medicare Other | Admitting: Anesthesiology

## 2023-01-01 ENCOUNTER — Ambulatory Visit (HOSPITAL_BASED_OUTPATIENT_CLINIC_OR_DEPARTMENT_OTHER): Payer: Medicare Other | Admitting: Anesthesiology

## 2023-01-01 ENCOUNTER — Encounter (HOSPITAL_COMMUNITY): Payer: Self-pay | Admitting: Gastroenterology

## 2023-01-01 ENCOUNTER — Ambulatory Visit (HOSPITAL_COMMUNITY)
Admission: RE | Admit: 2023-01-01 | Discharge: 2023-01-01 | Disposition: A | Discharge status: Home / Self Care | Payer: Medicare Other | Attending: Gastroenterology | Admitting: Gastroenterology

## 2023-01-01 ENCOUNTER — Encounter (HOSPITAL_COMMUNITY)
Admission: RE | Disposition: A | Discharge status: Home / Self Care | Payer: Self-pay | Source: Home / Self Care | Attending: Gastroenterology

## 2023-01-01 ENCOUNTER — Other Ambulatory Visit: Payer: Self-pay

## 2023-01-01 DIAGNOSIS — D124 Benign neoplasm of descending colon: Secondary | ICD-10-CM | POA: Diagnosis not present

## 2023-01-01 DIAGNOSIS — K589 Irritable bowel syndrome without diarrhea: Secondary | ICD-10-CM | POA: Insufficient documentation

## 2023-01-01 DIAGNOSIS — E782 Mixed hyperlipidemia: Secondary | ICD-10-CM

## 2023-01-01 DIAGNOSIS — D125 Benign neoplasm of sigmoid colon: Secondary | ICD-10-CM | POA: Diagnosis not present

## 2023-01-01 DIAGNOSIS — D123 Benign neoplasm of transverse colon: Secondary | ICD-10-CM | POA: Diagnosis not present

## 2023-01-01 DIAGNOSIS — Z800 Family history of malignant neoplasm of digestive organs: Secondary | ICD-10-CM | POA: Diagnosis not present

## 2023-01-01 DIAGNOSIS — Z86010 Personal history of colonic polyps: Secondary | ICD-10-CM | POA: Diagnosis not present

## 2023-01-01 DIAGNOSIS — E039 Hypothyroidism, unspecified: Secondary | ICD-10-CM | POA: Insufficient documentation

## 2023-01-01 DIAGNOSIS — Z1211 Encounter for screening for malignant neoplasm of colon: Secondary | ICD-10-CM | POA: Diagnosis not present

## 2023-01-01 DIAGNOSIS — K5730 Diverticulosis of large intestine without perforation or abscess without bleeding: Secondary | ICD-10-CM | POA: Insufficient documentation

## 2023-01-01 DIAGNOSIS — E785 Hyperlipidemia, unspecified: Secondary | ICD-10-CM | POA: Diagnosis not present

## 2023-01-01 DIAGNOSIS — F17210 Nicotine dependence, cigarettes, uncomplicated: Secondary | ICD-10-CM | POA: Insufficient documentation

## 2023-01-01 DIAGNOSIS — Z09 Encounter for follow-up examination after completed treatment for conditions other than malignant neoplasm: Secondary | ICD-10-CM | POA: Insufficient documentation

## 2023-01-01 DIAGNOSIS — K635 Polyp of colon: Secondary | ICD-10-CM | POA: Diagnosis not present

## 2023-01-01 DIAGNOSIS — K219 Gastro-esophageal reflux disease without esophagitis: Secondary | ICD-10-CM | POA: Insufficient documentation

## 2023-01-01 DIAGNOSIS — K648 Other hemorrhoids: Secondary | ICD-10-CM | POA: Insufficient documentation

## 2023-01-01 HISTORY — PX: COLONOSCOPY WITH PROPOFOL: SHX5780

## 2023-01-01 HISTORY — PX: POLYPECTOMY: SHX149

## 2023-01-01 LAB — HM COLONOSCOPY

## 2023-01-01 SURGERY — COLONOSCOPY WITH PROPOFOL
Anesthesia: General

## 2023-01-01 MED ORDER — PROPOFOL 500 MG/50ML IV EMUL
INTRAVENOUS | Status: AC
  Filled 2023-01-01: qty 50

## 2023-01-01 MED ORDER — GLYCOPYRROLATE PF 0.2 MG/ML IJ SOSY
PREFILLED_SYRINGE | INTRAMUSCULAR | Status: AC
  Filled 2023-01-01: qty 1

## 2023-01-01 MED ORDER — LACTATED RINGERS IV SOLN: INTRAVENOUS | Status: DC

## 2023-01-01 MED ORDER — SIMETHICONE 40 MG/0.6ML PO SUSP
40.0000 mg | Freq: Once | ORAL | Status: AC
  Administered 2023-01-01: 40 mg via ORAL

## 2023-01-01 MED ORDER — GLYCOPYRROLATE PF 0.2 MG/ML IJ SOSY
PREFILLED_SYRINGE | INTRAMUSCULAR | Status: DC | PRN
  Administered 2023-01-01: .2 mg via INTRAVENOUS

## 2023-01-01 MED ORDER — PROPOFOL 10 MG/ML IV BOLUS
INTRAVENOUS | Status: DC | PRN
  Administered 2023-01-01: 50 mg via INTRAVENOUS
  Administered 2023-01-01: 100 mg via INTRAVENOUS

## 2023-01-01 MED ORDER — EPHEDRINE 5 MG/ML INJ
INTRAVENOUS | Status: AC
  Filled 2023-01-01: qty 5

## 2023-01-01 MED ORDER — LIDOCAINE HCL (CARDIAC) PF 100 MG/5ML IV SOSY
PREFILLED_SYRINGE | INTRAVENOUS | Status: DC | PRN
  Administered 2023-01-01: 50 mg via INTRAVENOUS

## 2023-01-01 MED ORDER — PHENYLEPHRINE 80 MCG/ML (10ML) SYRINGE FOR IV PUSH (FOR BLOOD PRESSURE SUPPORT)
PREFILLED_SYRINGE | INTRAVENOUS | Status: AC
  Filled 2023-01-01: qty 10

## 2023-01-01 MED ORDER — SIMETHICONE 40 MG/0.6ML PO SUSP
ORAL | Status: AC
  Filled 2023-01-01: qty 30

## 2023-01-01 MED ORDER — PHENYLEPHRINE 80 MCG/ML (10ML) SYRINGE FOR IV PUSH (FOR BLOOD PRESSURE SUPPORT)
PREFILLED_SYRINGE | INTRAVENOUS | Status: DC | PRN
  Administered 2023-01-01: 160 ug via INTRAVENOUS
  Administered 2023-01-01 (×2): 80 ug via INTRAVENOUS

## 2023-01-01 MED ORDER — EPHEDRINE SULFATE-NACL 50-0.9 MG/10ML-% IV SOSY
PREFILLED_SYRINGE | INTRAVENOUS | Status: DC | PRN
  Administered 2023-01-01: 5 mg via INTRAVENOUS

## 2023-01-01 MED ORDER — PROPOFOL 500 MG/50ML IV EMUL
INTRAVENOUS | Status: DC | PRN
  Administered 2023-01-01: 150 ug/kg/min via INTRAVENOUS

## 2023-01-01 NOTE — Anesthesia Procedure Notes (Signed)
Date/Time: 01/01/2023 9:56 AM  Performed by: Julian Reil, CRNAPre-anesthesia Checklist: Patient identified, Emergency Drugs available, Suction available and Patient being monitored Patient Re-evaluated:Patient Re-evaluated prior to induction Oxygen Delivery Method: Nasal cannula Induction Type: IV induction Placement Confirmation: positive ETCO2

## 2023-01-01 NOTE — Transfer of Care (Signed)
Immediate Anesthesia Transfer of Care Note  Patient: Frank Weber  Procedure(s) Performed: COLONOSCOPY WITH PROPOFOL POLYPECTOMY INTESTINAL  Patient Location: Endoscopy Unit  Anesthesia Type:General  Level of Consciousness: drowsy  Airway & Oxygen Therapy: Patient Spontanous Breathing  Post-op Assessment: Report given to RN and Post -op Vital signs reviewed and stable  Post vital signs: Reviewed and stable  Last Vitals:  Vitals Value Taken Time  BP    Temp    Pulse    Resp    SpO2      Last Pain:  Vitals:   01/01/23 0951  TempSrc:   PainSc: 0-No pain      Patients Stated Pain Goal: 5 (01/01/23 0902)  Complications: No notable events documented.

## 2023-01-01 NOTE — Progress Notes (Signed)
Pt states abdominal pain is now 4/10.  Informed Dr. Levon Hedger who stated ok to discharge home.

## 2023-01-01 NOTE — Anesthesia Preprocedure Evaluation (Signed)
Anesthesia Evaluation  Patient identified by MRN, date of birth, ID band Patient awake    Reviewed: Allergy & Precautions, H&P , NPO status , Patient's Chart, lab work & pertinent test results, reviewed documented beta blocker date and time   Airway Mallampati: II  TM Distance: >3 FB Neck ROM: full    Dental no notable dental hx.    Pulmonary neg pulmonary ROS, Current Smoker   Pulmonary exam normal breath sounds clear to auscultation       Cardiovascular Exercise Tolerance: Good negative cardio ROS + Valvular Problems/Murmurs  Rhythm:regular Rate:Normal     Neuro/Psych  Headaches  Neuromuscular disease negative neurological ROS  negative psych ROS   GI/Hepatic negative GI ROS, Neg liver ROS,GERD  ,,  Endo/Other  negative endocrine ROSHypothyroidism    Renal/GU Renal diseasenegative Renal ROS  negative genitourinary   Musculoskeletal   Abdominal   Peds  Hematology negative hematology ROS (+)   Anesthesia Other Findings   Reproductive/Obstetrics negative OB ROS                             Anesthesia Physical Anesthesia Plan  ASA: 3  Anesthesia Plan: General   Post-op Pain Management:    Induction:   PONV Risk Score and Plan: Propofol infusion  Airway Management Planned:   Additional Equipment:   Intra-op Plan:   Post-operative Plan:   Informed Consent: I have reviewed the patients History and Physical, chart, labs and discussed the procedure including the risks, benefits and alternatives for the proposed anesthesia with the patient or authorized representative who has indicated his/her understanding and acceptance.     Dental Advisory Given  Plan Discussed with: CRNA  Anesthesia Plan Comments:        Anesthesia Quick Evaluation

## 2023-01-01 NOTE — H&P (Signed)
Frank Weber is an 71 y.o. male.   Chief Complaint: History of colonic polyps HPI: 72 year old male with past medical history of skin cancer, GERD, IBS, hypothyroidism, hyperlipidemia, coming for history of colonic polyps.  Last colonoscopy performed in 2022, had 10 tubular adenomas removed.  The patient denies having any complaints such as melena, hematochezia, abdominal pain or distention, change in her bowel movement consistency or frequency, no changes in weight recently.  Grandfather has history of colon cancer.   Past Medical History:  Diagnosis Date   Arthritis    right knee   Cancer (HCC)    Skin   Constipation    Frequency of urination    GERD (gastroesophageal reflux disease)    Heart murmur    "slight"    History of kidney stones    History of melanoma excision    2012--  NECK/ HEAD   History of squamous cell carcinoma excision    RIGHT EAR   Hyperlipidemia    Hypothyroidism    IBS (irritable bowel syndrome)    states slight   Lumbar stenosis    L5 - S1   Migraines    Peripheral neuropathy    legs and feet   Renal insufficiency    Right flank pain    Right ureteral stone    Urgency of urination    Wears glasses     Past Surgical History:  Procedure Laterality Date   BIOPSY  01/10/2021   Procedure: BIOPSY;  Surgeon: Dolores Frame, MD;  [facility]: [facility];  Service: Gastroenterology;;   CARDIAC CATHETERIZATION  07-08-2007  dr Jenne Campus    no significant CAD, preserved LVF, ef 50%//  30% pLAD   CARDIOVASCULAR STRESS TEST  06-20-2011  dr croitoru   Low risk study/  normal perfusion scan showing attenuation artifact in the anterior region of myocardium with no ischemia , infarct or scar/  normal LV funciton and wall motion , ef 62%   COLONOSCOPY N/A 02/17/2013   Rehman:examination performed to cecum. mild sigmoid colon diverticulosis, 4 small polyps ablated via cold biopsy from rectosigmoid junction (hyperplastic). External hemorrhoids.    COLONOSCOPY WITH PROPOFOL N/A 01/10/2021   Procedure: COLONOSCOPY WITH PROPOFOL;  Surgeon: Dolores Frame, MD;  [facility]: [facility];  Service: Gastroenterology;  Laterality: N/A;   CYSTOSCOPY W/ URETERAL STENT PLACEMENT Right 06/18/2015   Procedure: CYSTOSCOPY WITH RIGHT RETROGRADE PYELOGRAM RIGHT URETERAL STENT PLACEMENT;  Surgeon: Bjorn Pippin, MD;  Location: [facility];  Service: Urology;  Laterality: Right;   CYSTOSCOPY/URETEROSCOPY/HOLMIUM LASER/STENT PLACEMENT Right 06/27/2015   Procedure: CYSTOSCOPY RIGHT URETEROSCOPY  STENT REMOVAL; STONE EXTRACTION WITH BASKET;  Surgeon: Bjorn Pippin, MD;  Location: [facility];  Service: Urology;  Laterality: Right;   ESOPHAGOGASTRODUODENOSCOPY (EGD) WITH PROPOFOL N/A 01/10/2021   Procedure: ESOPHAGOGASTRODUODENOSCOPY (EGD) WITH PROPOFOL;  Surgeon: Dolores Frame, MD;  [facility];  Service: Gastroenterology;  Laterality: N/A;  11:00, moved up per Soledad Gerlach - pt knows arrival time   HEAD & NECK SKIN LESION EXCISIONAL BIOPSY     HOLMIUM LASER APPLICATION Right 06/27/2015   Procedure: HOLMIUM LASER LITHOTRIPSY ;  Surgeon: Bjorn Pippin, MD;  Location: [facility];  Service: Urology;  Laterality: Right;   I & D EXTREMITY Right 07/14/2018   Procedure: IRRIGATION AND DEBRIDEMENT RIGHT KNEE WITH CLOSURE;  Surgeon: Yolonda Kida, MD;  Location: [facility];  Service: Orthopedics;  Laterality: Right;   INGUINAL HERNIA REPAIR Bilateral 10/21/2007   KNEE ARTHROSCOPY W/  ACL RECONSTRUCTION Right 1992   MENISCUS REPAIR Right    2019, dr Thomasena Edis    POLYPECTOMY  01/10/2021   Procedure: POLYPECTOMY;  Surgeon: Dolores Frame, MD;  [facility];  Service: Gastroenterology;;   SHOULDER ARTHROSCOPY WITH OPEN ROTATOR CUFF REPAIR Left 07/27/2014   Procedure: LEFT SHOULDER ARTHROSCOPY WITH MINI OPEN ROTATOR CUFF REPAIR;  Surgeon: Nestor Lewandowsky, MD;  Location: [facility];  Service:  Orthopedics;  Laterality: Left;   SHOULDER ARTHROSCOPY WITH OPEN ROTATOR CUFF REPAIR Right 2006   TONSILLECTOMY AND ADENOIDECTOMY  age 89   TOTAL KNEE ARTHROPLASTY Right 07/10/2018   Procedure: TOTAL KNEE ARTHROPLASTY;  Surgeon: Eugenia Mcalpine, MD;  Location: [facility];  Service: Orthopedics;  Laterality: Right;   TRANSTHORACIC ECHOCARDIOGRAM  07/30/2007   normal LVF, ef >55%/  mild AR, MR , PR and TR/  mild AV sclerosis without stenosis/    URETEROSCOPY Right 06/18/2015   Procedure: URETEROSCOPY;  Surgeon: Bjorn Pippin, MD;  Location: [facility];  Service: Urology;  Laterality: Right;    Family History  Problem Relation Age of Onset   Colon cancer Other        Age of Onset-late 49's   Social History:  reports that he has been smoking cigars. He has been exposed to tobacco smoke. He has never used smokeless tobacco. He reports current alcohol use. He reports that he does not use drugs.  Allergies:  Allergies  Allergen Reactions   Sulfa Antibiotics Anaphylaxis, Shortness Of Breath and Rash   Coconut (Cocos Nucifera) Rash    Medications Prior to Admission  Medication Sig Dispense Refill   ALPRAZOLAM XR 1 MG 24 hr tablet Take 1 mg by mouth at bedtime.     aspirin EC 81 MG tablet Take 1 tablet (81 mg total) by mouth daily. Swallow whole. 90 tablet 3   atorvastatin (LIPITOR) 20 MG tablet Take 20 mg by mouth daily.     Cholecalciferol (DIALYVITE VITAMIN D 5000) 125 MCG (5000 UT) capsule Take 5,000 Units by mouth daily.     gabapentin (NEURONTIN) 300 MG capsule Take 900 mg by mouth 4 (four) times daily.     levothyroxine (SYNTHROID, LEVOTHROID) 25 MCG tablet Take 25 mcg by mouth daily before breakfast.     linaclotide (LINZESS) 145 MCG CAPS capsule Take 145 mcg by mouth daily before breakfast.     LORazepam (ATIVAN) 0.5 MG tablet Take 0.5 mg by mouth 3 (three) times daily as needed for anxiety.     MAGNESIUM PO Take 1 tablet by mouth daily. With zinc and vitamin d     mirtazapine (REMERON) 15 MG  tablet Take 1 tablet (15 mg total) by mouth at bedtime. 90 tablet 1   nitroGLYCERIN (NITROSTAT) 0.4 MG SL tablet Place 1 tablet (0.4 mg total) under the tongue every 5 (five) minutes as needed. 25 tablet 3   ondansetron (ZOFRAN) 4 MG tablet TAKE (1) TABLET BY MOUTH EVERY EIGHT HOURS AS NEEDED FOR NAUSEA 20 tablet 1   Polyethyl Glycol-Propyl Glycol (SYSTANE ULTRA) 0.4-0.3 % SOLN Place 2 drops into both eyes as needed (for dry eyes).      Probiotic Product (TRUBIOTICS PO) Take 1 capsule by mouth at bedtime.     rizatriptan (MAXALT-MLT) 10 MG disintegrating tablet Take 10 mg by mouth as needed (for migraines and may repeat once in 2 hours, if no relief).      triamcinolone cream (KENALOG) 0.1 % Apply 1 Application topically 2 (two) times daily.  Vitamin D-Vitamin K (VITAMIN K2-VITAMIN D3 PO) Take 1 drop by mouth once a week.      No results found for this or any previous visit (from the past 48 hour(s)). No results found.  Review of Systems  All other systems reviewed and are negative.   Blood pressure 118/71, pulse (!) 45, temperature 97.9 F (36.6 C), temperature source Oral, resp. rate 20, height 6\' 3"  (1.905 m), weight 95.3 kg, SpO2 98%. Physical Exam  GENERAL: The patient is AO x3, in no acute distress. HEENT: Head is normocephalic and atraumatic. EOMI are intact. Mouth is well hydrated and without lesions. NECK: Supple. No masses LUNGS: Clear to auscultation. No presence of rhonchi/wheezing/rales. Adequate chest expansion HEART: RRR, normal s1 and s2. ABDOMEN: Soft, nontender, no guarding, no peritoneal signs, and nondistended. BS +. No masses. EXTREMITIES: Without any cyanosis, clubbing, rash, lesions or edema. NEUROLOGIC: AOx3, no focal motor deficit. SKIN: no jaundice, no rashes  Assessment/Plan  72 year old male with past medical history of skin cancer, GERD, IBS, hypothyroidism, hyperlipidemia, coming for history of colonic polyps.  Will proceed with colonoscopy.  Dolores Frame, MD 01/01/2023, 9:10 AM

## 2023-01-01 NOTE — Discharge Instructions (Signed)
You are being discharged to home.  Resume your previous diet.  We are waiting for your pathology results.  Your physician has recommended a repeat colonoscopy for surveillance based on pathology results.  

## 2023-01-01 NOTE — Progress Notes (Signed)
Pt c/o aching lower abdominal pain 6/10. Dr. Levon Hedger at bedside and informed.  Order given for mylicon now.  Will continue to monitor.

## 2023-01-01 NOTE — Op Note (Signed)
[facility] Patient Name: Frank Weber Procedure Date: 01/01/2023 9:40 AM MRN: [id number] Date of Birth: [birth year] Attending MD: Katrinka Blazing , , [id number] CSN: [id number] Age: 71 Admit Type: Outpatient Procedure:                Colonoscopy Indications:              Surveillance: History of numerous (> 10) adenomas                            on last colonoscopy (< 3 yrs) Providers:                Katrinka Blazing, Crystal Page, Lennice Sites                            Technician, Technician Referring MD:              Medicines:                Monitored Anesthesia Care Complications:            No immediate complications. Estimated Blood Loss:     Estimated blood loss: none. Procedure:                Pre-Anesthesia Assessment:                           - Prior to the procedure, a History and Physical                            was performed, and patient medications, allergies                            and sensitivities were reviewed. The patient's                            tolerance of previous anesthesia was reviewed.                           - The risks and benefits of the procedure and the                            sedation options and risks were discussed with the                            patient. All questions were answered and informed                            consent was obtained.                           - ASA Grade Assessment: II - A patient with mild                            systemic disease.                           After obtaining informed consent, the colonoscope  was passed under direct vision. Throughout the                            procedure, the patient's blood pressure, pulse, and                            oxygen saturations were monitored continuously. The                            [id number]-[id number] ([id number]) scope was introduced through                            the anus and advanced to the the cecum, identified                             by appendiceal orifice and ileocecal valve. The                            colonoscopy was performed without difficulty. The                            patient tolerated the procedure well. The quality                            of the bowel preparation was good. Scope In: 9:55:41 AM Scope Out: 10:19:17 AM Scope Withdrawal Time: 0 hours 14 minutes 20 seconds  Total Procedure Duration: 0 hours 23 minutes 36 seconds  Findings:      The perianal and digital rectal examinations were normal.      A 4 mm polyp was found in the transverse colon. The polyp was sessile.       The polyp was removed with a cold snare. Resection and retrieval were       complete.      Four sessile and semi-sessile polyps were found in the sigmoid colon and       descending colon. The polyps were 2 to 6 mm in size. These polyps were       removed with a cold snare. Resection and retrieval were complete.      Multiple large-mouthed and small-mouthed diverticula were found in the       sigmoid colon and descending colon.      Non-bleeding internal hemorrhoids were found during retroflexion. The       hemorrhoids were small. Impression:               - One 4 mm polyp in the transverse colon, removed                            with a cold snare. Resected and retrieved.                           - Four 2 to 6 mm polyps in the sigmoid colon and in                            the descending colon, removed with a cold snare.  Resected and retrieved.                           - Diverticulosis in the sigmoid colon and in the                            descending colon.                           - Non-bleeding internal hemorrhoids. Moderate Sedation:      Per Anesthesia Care Recommendation:           - Discharge patient to home (ambulatory).                           - Resume previous diet.                           - Await pathology results.                           - Repeat  colonoscopy for surveillance based on                            pathology results. Procedure Code(s):        --- Professional ---                           [phone], Colonoscopy, flexible; with removal of                            tumor(s), polyp(s), or other lesion(s) by snare                            technique Diagnosis Code(s):        --- Professional ---                           Z86.010, Personal history of colonic polyps                           D12.3, Benign neoplasm of transverse colon (hepatic                            flexure or splenic flexure)                           D12.5, Benign neoplasm of sigmoid colon                           D12.4, Benign neoplasm of descending colon                           K64.8, Other hemorrhoids                           K57.30, Diverticulosis of large intestine without  perforation or abscess without bleeding CPT copyright 2022 American Medical Association. All rights reserved. The codes documented in this report are preliminary and upon coder review may  be revised to meet current compliance requirements. Katrinka Blazing, MD Katrinka Blazing,  01/01/2023 10:26:23 AM This report has been signed electronically. Number of Addenda: 0

## 2023-01-02 ENCOUNTER — Encounter (INDEPENDENT_AMBULATORY_CARE_PROVIDER_SITE_OTHER): Payer: Self-pay | Admitting: *Deleted

## 2023-01-02 NOTE — Anesthesia Postprocedure Evaluation (Signed)
Anesthesia Post Note  Patient: Frank Weber  Procedure(s) Performed: COLONOSCOPY WITH PROPOFOL POLYPECTOMY INTESTINAL  Patient location during evaluation: Phase II Anesthesia Type: General Level of consciousness: awake Pain management: pain level controlled Vital Signs Assessment: post-procedure vital signs reviewed and stable Respiratory status: spontaneous breathing and respiratory function stable Cardiovascular status: blood pressure returned to baseline and stable Postop Assessment: no headache and no apparent nausea or vomiting Anesthetic complications: no Comments: Late entry   No notable events documented.   Last Vitals:  Vitals:   01/01/23 0902 01/01/23 1022  BP: 118/71 111/66  Pulse: (!) 45 (!) 53  Resp: 20 15  Temp: 36.6 C 36.4 C  SpO2: 98% 97%    Last Pain:  Vitals:   01/01/23 1030  TempSrc:   PainSc: 6                  Windell Norfolk

## 2023-01-03 ENCOUNTER — Encounter (HOSPITAL_COMMUNITY): Payer: Self-pay | Admitting: Gastroenterology

## 2023-01-07 ENCOUNTER — Encounter (INDEPENDENT_AMBULATORY_CARE_PROVIDER_SITE_OTHER): Payer: Self-pay | Admitting: *Deleted

## 2023-02-21 ENCOUNTER — Emergency Department (HOSPITAL_COMMUNITY): Payer: Medicare Other

## 2023-02-21 ENCOUNTER — Observation Stay (HOSPITAL_COMMUNITY)
Admission: EM | Admit: 2023-02-21 | Discharge: 2023-02-22 | Disposition: A | Discharge status: Home / Self Care | Payer: Medicare Other | Attending: Family Medicine | Admitting: Family Medicine

## 2023-02-21 ENCOUNTER — Other Ambulatory Visit: Payer: Self-pay

## 2023-02-21 ENCOUNTER — Encounter (HOSPITAL_COMMUNITY): Payer: Self-pay | Admitting: Emergency Medicine

## 2023-02-21 DIAGNOSIS — Z8673 Personal history of transient ischemic attack (TIA), and cerebral infarction without residual deficits: Secondary | ICD-10-CM | POA: Diagnosis present

## 2023-02-21 DIAGNOSIS — G459 Transient cerebral ischemic attack, unspecified: Secondary | ICD-10-CM | POA: Diagnosis not present

## 2023-02-21 DIAGNOSIS — Z7982 Long term (current) use of aspirin: Secondary | ICD-10-CM | POA: Diagnosis not present

## 2023-02-21 DIAGNOSIS — M6281 Muscle weakness (generalized): Secondary | ICD-10-CM | POA: Insufficient documentation

## 2023-02-21 DIAGNOSIS — R0789 Other chest pain: Secondary | ICD-10-CM | POA: Diagnosis not present

## 2023-02-21 DIAGNOSIS — Z85828 Personal history of other malignant neoplasm of skin: Secondary | ICD-10-CM | POA: Diagnosis not present

## 2023-02-21 DIAGNOSIS — R079 Chest pain, unspecified: Secondary | ICD-10-CM | POA: Diagnosis present

## 2023-02-21 DIAGNOSIS — E782 Mixed hyperlipidemia: Secondary | ICD-10-CM | POA: Diagnosis present

## 2023-02-21 DIAGNOSIS — Z79899 Other long term (current) drug therapy: Secondary | ICD-10-CM | POA: Diagnosis not present

## 2023-02-21 DIAGNOSIS — F17290 Nicotine dependence, other tobacco product, uncomplicated: Secondary | ICD-10-CM | POA: Diagnosis not present

## 2023-02-21 DIAGNOSIS — E039 Hypothyroidism, unspecified: Secondary | ICD-10-CM | POA: Insufficient documentation

## 2023-02-21 DIAGNOSIS — R0689 Other abnormalities of breathing: Secondary | ICD-10-CM | POA: Diagnosis not present

## 2023-02-21 LAB — TROPONIN I (HIGH SENSITIVITY): Troponin I (High Sensitivity): 4 ng/L (ref <18)

## 2023-02-21 LAB — CBC
HCT: 47.5 % (ref 39.0–52.0)
Hemoglobin: 16.2 g/dL (ref 13.0–17.0)
MCH: 32.5 pg (ref 26.0–34.0)
MCHC: 34.1 g/dL (ref 30.0–36.0)
MCV: 95.4 fL (ref 80.0–100.0)
Platelets: 190 10*3/uL (ref 150–400)
RBC: 4.98 MIL/uL (ref 4.22–5.81)
RDW: 12.9 % (ref 11.5–15.5)
WBC: 5.9 10*3/uL (ref 4.0–10.5)
nRBC: 0 % (ref 0.0–0.2)

## 2023-02-21 LAB — BASIC METABOLIC PANEL
Anion gap: 10 (ref 5–15)
BUN: 12 mg/dL (ref 8–23)
CO2: 23 mmol/L (ref 22–32)
Calcium: 9.2 mg/dL (ref 8.9–10.3)
Chloride: 104 mmol/L (ref 98–111)
Creatinine, Ser: 1.23 mg/dL (ref 0.61–1.24)
GFR, Estimated: >60 mL/min (ref >60)
Glucose, Bld: 117 mg/dL — ABNORMAL HIGH (ref 70–99)
Potassium: 3.7 mmol/L (ref 3.5–5.1)
Sodium: 137 mmol/L (ref 135–145)

## 2023-02-21 NOTE — ED Triage Notes (Signed)
Pt states he got a cup of coffee around 2220 when he states his whole mouth started burning and then he had pain in his right chest that radiated down R arm and states his "whole right side when numb". Now pt c/o L sided chest pain, difficulty breathing, and numbness on L side. Pt states he took 2 Benadryl pta.

## 2023-02-22 ENCOUNTER — Emergency Department (HOSPITAL_COMMUNITY): Payer: Medicare Other

## 2023-02-22 ENCOUNTER — Observation Stay (HOSPITAL_COMMUNITY): Payer: Medicare Other

## 2023-02-22 ENCOUNTER — Other Ambulatory Visit (HOSPITAL_COMMUNITY): Payer: Medicare Other

## 2023-02-22 DIAGNOSIS — E039 Hypothyroidism, unspecified: Secondary | ICD-10-CM | POA: Insufficient documentation

## 2023-02-22 DIAGNOSIS — R531 Weakness: Secondary | ICD-10-CM | POA: Diagnosis not present

## 2023-02-22 DIAGNOSIS — R0689 Other abnormalities of breathing: Secondary | ICD-10-CM | POA: Diagnosis not present

## 2023-02-22 DIAGNOSIS — E782 Mixed hyperlipidemia: Secondary | ICD-10-CM

## 2023-02-22 DIAGNOSIS — I6523 Occlusion and stenosis of bilateral carotid arteries: Secondary | ICD-10-CM | POA: Diagnosis not present

## 2023-02-22 DIAGNOSIS — R079 Chest pain, unspecified: Secondary | ICD-10-CM | POA: Diagnosis not present

## 2023-02-22 DIAGNOSIS — Z8673 Personal history of transient ischemic attack (TIA), and cerebral infarction without residual deficits: Secondary | ICD-10-CM | POA: Diagnosis present

## 2023-02-22 DIAGNOSIS — H538 Other visual disturbances: Secondary | ICD-10-CM | POA: Diagnosis not present

## 2023-02-22 DIAGNOSIS — G459 Transient cerebral ischemic attack, unspecified: Secondary | ICD-10-CM | POA: Diagnosis not present

## 2023-02-22 DIAGNOSIS — R200 Anesthesia of skin: Secondary | ICD-10-CM | POA: Diagnosis not present

## 2023-02-22 LAB — TROPONIN I (HIGH SENSITIVITY)
Troponin I (High Sensitivity): 4 ng/L (ref <18)
Troponin I (High Sensitivity): 4 ng/L (ref <18)

## 2023-02-22 LAB — LIPID PANEL
Cholesterol: 140 mg/dL (ref 0–200)
HDL: 50 mg/dL (ref >40)
LDL Cholesterol: 81 mg/dL (ref 0–99)
Total CHOL/HDL Ratio: 2.8 ratio
Triglycerides: 43 mg/dL (ref <150)
VLDL: 9 mg/dL (ref 0–40)

## 2023-02-22 LAB — TSH: TSH: 2.941 u[IU]/mL (ref 0.350–4.500)

## 2023-02-22 LAB — VITAMIN B12: Vitamin B-12: 120 pg/mL — ABNORMAL LOW (ref 180–914)

## 2023-02-22 MED ORDER — GABAPENTIN 300 MG PO CAPS
900.0000 mg | ORAL_CAPSULE | Freq: Four times a day (QID) | ORAL | Status: DC
  Administered 2023-02-22: 900 mg via ORAL
  Filled 2023-02-22: qty 3

## 2023-02-22 MED ORDER — LORAZEPAM 0.5 MG PO TABS: 0.5000 mg | ORAL_TABLET | Freq: Three times a day (TID) | ORAL | Status: DC | PRN

## 2023-02-22 MED ORDER — ONDANSETRON HCL 4 MG/2ML IJ SOLN
4.0000 mg | Freq: Once | INTRAMUSCULAR | Status: AC
  Administered 2023-02-22: 4 mg via INTRAVENOUS
  Filled 2023-02-22: qty 2

## 2023-02-22 MED ORDER — LEVOTHYROXINE SODIUM 50 MCG PO TABS
25.0000 ug | ORAL_TABLET | Freq: Every day | ORAL | Status: DC
  Administered 2023-02-22: 25 ug via ORAL
  Filled 2023-02-22: qty 1

## 2023-02-22 MED ORDER — ACETAMINOPHEN 160 MG/5ML PO SOLN: 650.0000 mg | ORAL | Status: DC | PRN

## 2023-02-22 MED ORDER — ASPIRIN 81 MG PO CHEW
324.0000 mg | CHEWABLE_TABLET | Freq: Once | ORAL | Status: AC
  Administered 2023-02-22: 324 mg via ORAL
  Filled 2023-02-22: qty 4

## 2023-02-22 MED ORDER — MIRTAZAPINE 15 MG PO TABS: 15.0000 mg | ORAL_TABLET | Freq: Every day | ORAL | Status: DC

## 2023-02-22 MED ORDER — LEVOTHYROXINE SODIUM 50 MCG PO TABS: 25.0000 ug | ORAL_TABLET | Freq: Every day | ORAL | Status: DC

## 2023-02-22 MED ORDER — CYANOCOBALAMIN 1000 MCG/ML IJ SOLN
1000.0000 ug | Freq: Once | INTRAMUSCULAR | Status: AC
  Administered 2023-02-22: 1000 ug via INTRAMUSCULAR
  Filled 2023-02-22 (×2): qty 1

## 2023-02-22 MED ORDER — VITAMIN B-12 1000 MCG PO TABS: 1000.0000 ug | ORAL_TABLET | Freq: Every day | ORAL | Status: DC

## 2023-02-22 MED ORDER — ACETAMINOPHEN 650 MG RE SUPP: 650.0000 mg | RECTAL | Status: DC | PRN

## 2023-02-22 MED ORDER — HEPARIN SODIUM (PORCINE) 5000 UNIT/ML IJ SOLN
5000.0000 [IU] | Freq: Three times a day (TID) | INTRAMUSCULAR | Status: DC
  Administered 2023-02-22: 5000 [IU] via SUBCUTANEOUS
  Filled 2023-02-22: qty 1

## 2023-02-22 MED ORDER — ATORVASTATIN CALCIUM 20 MG PO TABS
20.0000 mg | ORAL_TABLET | Freq: Every day | ORAL | Status: DC
  Administered 2023-02-22: 20 mg via ORAL
  Filled 2023-02-22: qty 2

## 2023-02-22 MED ORDER — SENNOSIDES-DOCUSATE SODIUM 8.6-50 MG PO TABS: 1.0000 | ORAL_TABLET | Freq: Every evening | ORAL | Status: DC | PRN

## 2023-02-22 MED ORDER — LEVOTHYROXINE SODIUM 50 MCG PO TABS: 50.0000 ug | ORAL_TABLET | Freq: Every day | ORAL | Status: DC

## 2023-02-22 MED ORDER — ACETAMINOPHEN 325 MG PO TABS: 650.0000 mg | ORAL_TABLET | ORAL | Status: DC | PRN

## 2023-02-22 MED ORDER — CYANOCOBALAMIN 1000 MCG PO TABS: 1000.0000 ug | ORAL_TABLET | Freq: Every day | ORAL | 0 refills | Status: AC

## 2023-02-22 MED ORDER — ASPIRIN 81 MG PO TBEC
81.0000 mg | DELAYED_RELEASE_TABLET | Freq: Every day | ORAL | Status: DC
  Administered 2023-02-22: 81 mg via ORAL
  Filled 2023-02-22: qty 1

## 2023-02-22 NOTE — TOC CM/SW Note (Signed)
Transition of Care [facility]) - Inpatient Brief Assessment   Patient Details  Name: Frank Weber MRN: [id number] Date of Birth: [birth year]  Transition of Care [facility]) CM/SW Contact:    [name], LCSWA Phone Number: 02/22/2023, 12:37 PM   Clinical Narrative: Transition of Care Department [facility]) has reviewed patient and no TOC needs have been identified at this time. We will continue to monitor patient advancement through interdisciplinary progression rounds. If new patient transition needs arise, please place a TOC consult.  Transition of Care Asessment: Insurance and Status: Insurance coverage has been reviewed Patient has primary care physician: Yes Home environment has been reviewed: from home Prior level of function:: independent Prior/Current Home Services: No current home services Social Determinants of Health Reivew: SDOH reviewed no interventions necessary Readmission risk has been reviewed: Yes Transition of care needs: no transition of care needs at this time

## 2023-02-22 NOTE — ED Notes (Signed)
Report called to floor and floor requested ED to wait before sending pt.

## 2023-02-22 NOTE — Evaluation (Signed)
Physical Therapy Evaluation Patient Details Name: Frank Weber MRN: [id number] DOB: 10/11/1950 Today's Date: 02/22/2023  History of Present Illness  Frank Weber is a 72 y.o. male with medical history significant of GERD, heart murmur, hyperlipidemia, hypothyroidism, peripheral neuropathy, migraines, and more presents the ED with a chief complaint of paresthesias.  Patient reports that he was 9:30 PM that he was last known well.  Immediately after that he started to have paresthesias around his lips.  They then spread up his face to his eyes.  He took Benadryl thinking it was an allergic reaction.  The paresthesias eventually moved to the whole right side of his body with numbness and weakness associated.  Patient reports it went all the way down his legs to his toes, and he could not walk.  The paresthesias then went to his throat and his sinuses.  Next he experienced chest pain that was left-sided and went down his left arm.  Went all the way to his hand.  Patient reports that when the left-sided pain started the right-sided pain eased up.  He also had blurred vision during this time.  He could speak fine but had difficulty swallowing.  He reports that his right ear also felt muffled and like static.  He reports he had associated dyspnea.  He had a similar constellation of symptoms a couple years ago when he thought he was having a heart attack.  He reports he was never given a diagnosis for why he is feeling the symptoms.  Patient reports that he is back to baseline at this time.    Clinical Impression  Patient lying in bed on therapist arrival, pleasant and agreeable to therapist assessment.  Patient performs supine to sit and sit to stand independently; ambulates without AD x 100 ft with slight decreased gait speed but no loss of balance or path deviation noted.  Patient reports no pain during assessment only some lingering tingling in his left arm and around his mouth.  Patient returned to bed.  Patient with no further PT needs at this time.  patient left in bed with call button in reach and nursing notified of mobility status.         If plan is discharge home, recommend the following: Help with stairs or ramp for entrance   Can travel by private vehicle        Equipment Recommendations None recommended by PT  Recommendations for Other Services       Functional Status Assessment Patient has had a recent decline in their functional status and demonstrates the ability to make significant improvements in function in a reasonable and predictable amount of time.     Precautions / Restrictions Precautions Precautions: None Restrictions Weight Bearing Restrictions: No      Mobility  Bed Mobility Overal bed mobility: Independent                  Transfers Overall transfer level: Independent Equipment used: None                    Ambulation/Gait Ambulation/Gait assistance: Modified independent (Device/Increase time), Supervision Gait Distance (Feet): 100 Feet Assistive device: None Gait Pattern/deviations: WFL(Within Functional Limits) Gait velocity: slightly decreased        Stairs            Wheelchair Mobility     Tilt Bed    Modified Rankin (Stroke Patients Only)       Balance Overall balance assessment: Independent,  No apparent balance deficits (not formally assessed)                                           Pertinent Vitals/Pain Pain Assessment Pain Assessment: No/denies pain    Home Living Family/patient expects to be discharged to:: Private residence Living Arrangements: Spouse/significant other Available Help at Discharge: Family Type of Home: House Home Access: Stairs to enter Entrance Stairs-Rails: Right;Left;Can reach both Entrance Stairs-Number of Steps: 7 Alternate Level Stairs-Number of Steps: 13 Home Layout: Multi-level;Laundry or work area in basement;Bed/bath upstairs Home  Equipment: Agricultural consultant (2 wheels);Cane - single point;BSC/3in1 Additional Comments: wife has some back issues    Prior Function Prior Level of Function : Independent/Modified Independent                     Extremity/Trunk Assessment   Upper Extremity Assessment Upper Extremity Assessment: Overall WFL for tasks assessed    Lower Extremity Assessment Lower Extremity Assessment: Overall WFL for tasks assessed    Cervical / Trunk Assessment Cervical / Trunk Assessment: Normal  Communication   Communication Communication: No apparent difficulties  Cognition Arousal: Alert Behavior During Therapy: WFL for tasks assessed/performed Overall Cognitive Status: Within Functional Limits for tasks assessed                                 General Comments: pleasant and cooperative        General Comments      Exercises     Assessment/Plan    PT Assessment Patient does not need any further PT services  PT Problem List         PT Treatment Interventions      PT Goals (Current goals can be found in the Care Plan section)  Acute Rehab PT Goals Patient Stated Goal: return home PT Goal Formulation: With patient Time For Goal Achievement: 03/08/23 Potential to Achieve Goals: Good    Frequency       Co-evaluation               AM-PAC PT "6 Clicks" Mobility  Outcome Measure Help needed turning from your back to your side while in a flat bed without using bedrails?: None Help needed moving from lying on your back to sitting on the side of a flat bed without using bedrails?: None Help needed moving to and from a bed to a chair (including a wheelchair)?: None Help needed standing up from a chair using your arms (e.g., wheelchair or bedside chair)?: None Help needed to walk in hospital room?: None Help needed climbing 3-5 steps with a railing? : A Little 6 Click Score: 23    End of Session   Activity Tolerance: Patient tolerated treatment  well Patient left: in bed;with call bell/phone within reach;with nursing/sitter in room Nurse Communication: Mobility status PT Visit Diagnosis: Muscle weakness (generalized) (M62.81);Other symptoms and signs involving the nervous system (R29.898)    Time: 7829-5621 PT Time Calculation (min) (ACUTE ONLY): 20 min   Charges:   PT Evaluation $PT Eval Low Complexity: 1 Low   PT General Charges $$ ACUTE PT VISIT: 1 Visit         11:37 AM, 02/22/23 Kamaiya Antilla Small Norene Oliveri MPT [facility] Normal [phone]

## 2023-02-22 NOTE — Assessment & Plan Note (Signed)
-   Weird constellation of symptoms - CT head shows acute or subacute infarct versus artifact in the posterior right frontal lobe - Neuro was consulted and while this location is not consistent with the symptoms, they advised patient to be admitted for TIA workup - Echo in the a.m. - MRI in the a.m. - PT OT ST eval and treat - Lipid panel and hemoglobin A1c in the a.m. - Continue to monitor

## 2023-02-22 NOTE — Assessment & Plan Note (Signed)
-   Continue Synthroid - Given the nonspecific nature of the symptoms, check TSH

## 2023-02-22 NOTE — Care Management Obs Status (Signed)
MEDICARE OBSERVATION STATUS NOTIFICATION   Patient Details  Name: Frank Weber MRN: [id number] Date of Birth: 1950-06-26   Medicare Observation Status Notification Given:  Yes    [name], Theresia Majors 02/22/2023, 12:36 PM

## 2023-02-22 NOTE — Progress Notes (Signed)
Patient discharge instructions given patient verbalized understanding. Patient was not wanting to stay for echo to see him before discharge. Plans to do echo outpatient and follow up with PCP. Pt denies any questions concerning discharge. IV removed catheter intact.MD aware.

## 2023-02-22 NOTE — Consult Note (Signed)
[facility] Tele[facility]urology Consult Services  Stat Consult  Patient Name:   Frank Weber, Frank Weber Date of Birth:   1950/07/28 Identification Number:   MRN - [id number] Date of Service:   02/22/2023 01:25:43  Diagnosis:       R20.2 - Paresthesia of skin  Impression 72 year old male presenting with bilateral intermittent paresthesia and numbness associated with chest pain. CT of the head demonstrated a possible right frontal subacute infarction. Patient is also bradycardic. Recommend admission for chest pain and CVA evaluation.   Recommendations: Our recommendations are outlined below.  Diagnostic Studies : MRI head without contrastMRA head without contrastMRA neck with contrast  Laboratory Studies : Lipid panelHemoglobin A1cTSH  Antithrombotic Medication : Aspirin 81 mg PO daily  Anticoagulant Medication : Not indicated  Nursing Recommendations : IV Fluids, avoid dextrose containing fluids, Maintain euglycemiaNeuro checks q4 hrs x 24 hrs and then per shiftHead of bed 30 degreesContinue with Telemetry  Consultations : Recommend Speech therapy if failed dysphagia screenPhysical therapy/Occupational therapy  DVT Prophylaxis : Choice of Primary Team  Disposition : Neurology will follow   ----------------------------------------------------------------------------------------------------    Metrics: [facility] Notification Time: 02/22/2023 01:22:41 Stamp Time: 02/22/2023 01:25:43 Callback Response Time: 02/22/2023 01:27:13  Primary Provider Notified of Diagnostic Impression and Management Plan on: 02/22/2023 01:54:07   CT HEAD: Reviewed Possible subacute right frontal hypodensity    ----------------------------------------------------------------------------------------------------  Chief Complaint: Paresthesia  History of Present Illness: Patient is a 72 year old Male. 72 year old male with a history of hypertension and peripheral  neuropathy presenting in consult for paresthesia. Patient reports he had sudden onset of perioral paresthesia which was followed by pain and paresthesia in the right arm which was followed by chest pain and pain/paresthesia in the left arm and left leg. At the time of neurology evaluation patient reports symptoms of essentially resolved with the exception of mild paresthesia in the left foot.    Past Medical History:      Hyperlipidemia  Medications:  No Anticoagulant use  Antiplatelet use: Yes Aspirin 81 mg Reviewed EMR for current medications  Allergies:  Reviewed Description: Sulfa  Social History: Smoking: No  Family History:  There is no family history of premature cerebrovascular disease pertinent to this consultation  ROS : 14 Points Review of Systems was performed and was negative except mentioned in HPI.  Past Surgical History: There Is No Surgical History Contributory To Today's Visit    Examination: BP(108/73), Pulse(44),  Neuro Exam: General: Alert,Awake, Oriented to Time, Place, Person  Speech: Fluent:  Language: Intact:  Face: Symmetric:  Facial Sensation: Intact:  Visual Fields: Intact:  Extraocular Movements: Intact:  Motor Exam: No Drift:  Sensation: Intact:  Coordination: Intact:  Spoke with : Dr. Blinda Leatherwood    This consult was conducted in real time using interactive audio and Immunologist. Patient was informed of the technology being used for this visit and agreed to proceed. Patient located in hospital and provider located at home/office setting.  Patient is being evaluated for possible acute neurologic impairment and high probability of imminent or life - threatening deterioration.I spent total of 21 minutes providing care to this patient, including time for face to face visit via telemedicine, review of medical records, imaging studies and discussion of findings with providers, the patient and / or family.   Dr Vernice Jefferson   [facility] For Inpatient follow-up with [facility] physician please call [facility] [phone]. This is not an outpatient service. Post hospital discharge, please contact hospital directly.  Please do not communicate with [facility]  physicians via secure chat. If you have any questions, Please contact [facility]. Please call or reconsult our service if there are any clinical or diagnostic changes.

## 2023-02-22 NOTE — ED Notes (Signed)
Pt getting MRI

## 2023-02-22 NOTE — ED Provider Notes (Signed)
[place] EMERGENCY DEPARTMENT AT [facility] Provider Note   CSN: [id number] Arrival date & time: 02/21/23  2243     History  Chief Complaint  Patient presents with   Chest Pain   Shortness of Breath    Frank Weber is a 72 y.o. male.  Went to drink coffee, suddenly felt burning and tingling around mouth, then there was a buzzing in right ear. Ear felt big and tongue felt swelled up. The whole right side of body got numb. Had trouble walking. Then developed left side chest pain and shortness of breath. Sharp pains with breathing. Right side resolved, now same symptoms of burning, numbess on left.       Home Medications Prior to Admission medications   Medication Sig Start Date End Date Taking? Authorizing Provider  ALPRAZOLAM XR 1 MG 24 hr tablet Take 1 mg by mouth at bedtime. 11/17/19  Yes [provider]  aspirin EC 81 MG tablet Take 1 tablet (81 mg total) by mouth daily. Swallow whole. 12/24/19  Yes Wendall Stade, MD  atorvastatin (LIPITOR) 20 MG tablet Take 20 mg by mouth daily. 11/09/19  Yes [provider]  Cholecalciferol (DIALYVITE VITAMIN D 5000) 125 MCG (5000 UT) capsule Take 5,000 Units by mouth daily.   Yes [provider]  gabapentin (NEURONTIN) 300 MG capsule Take 900 mg by mouth 4 (four) times daily.   Yes [provider]  levothyroxine (SYNTHROID, LEVOTHROID) 25 MCG tablet Take 25 mcg by mouth daily before breakfast.   Yes [provider]  linaclotide (LINZESS) 145 MCG CAPS capsule Take 145 mcg by mouth daily before breakfast.   Yes [provider]  LORazepam (ATIVAN) 0.5 MG tablet Take 0.5 mg by mouth 3 (three) times daily as needed for anxiety.   Yes [provider]  mirtazapine (REMERON) 15 MG tablet Take 1 tablet (15 mg total) by mouth at bedtime. 11/25/22  Yes Carlan, Chelsea L, NP  nitroGLYCERIN (NITROSTAT) 0.4 MG SL tablet Place 1 tablet (0.4 mg total) under the tongue every 5 (five)  minutes as needed. 07/18/21  Yes Wendall Stade, MD  ondansetron (ZOFRAN) 4 MG tablet TAKE (1) TABLET BY MOUTH EVERY EIGHT HOURS AS NEEDED FOR NAUSEA Patient taking differently: Take 4 mg by mouth every 8 (eight) hours as needed for nausea or vomiting. 07/16/22  Yes Carlan, Chelsea L, NP  Polyethyl Glycol-Propyl Glycol (SYSTANE ULTRA) 0.4-0.3 % SOLN Place 2 drops into both eyes as needed (for dry eyes).    Yes [provider]  Probiotic Product (TRUBIOTICS PO) Take 1 capsule by mouth at bedtime.   Yes [provider]  rizatriptan (MAXALT-MLT) 10 MG disintegrating tablet Take 10 mg by mouth as needed (for migraines and may repeat once in 2 hours, if no relief).    Yes [provider]  triamcinolone cream (KENALOG) 0.1 % Apply 1 Application topically 2 (two) times daily. 12/27/22  Yes [provider]  Vitamin D-Vitamin K (VITAMIN K2-VITAMIN D3 PO) Take 1 drop by mouth once a week.   Yes [provider]  cyanocobalamin 1000 MCG tablet Take 1 tablet (1,000 mcg total) by mouth daily. 02/23/23 03/25/23  Kendell Bane, MD      Allergies    Sulfa antibiotics and Coconut (cocos nucifera)    Review of Systems   Review of Systems  Physical Exam Updated Vital Signs BP 119/69 (BP Location: Left Arm)   Pulse (!) 45   Temp 98.2 F (36.8  C) (Oral)   Resp 20   Ht 6\' 3"  (1.905 m)   Wt 95.2 kg   SpO2 100%   BMI 26.23 kg/m  Physical Exam Vitals and nursing note reviewed.  Constitutional:      General: He is not in acute distress.    Appearance: He is well-developed.  HENT:     Head: Normocephalic and atraumatic.     Mouth/Throat:     Mouth: Mucous membranes are moist.  Eyes:     General: Vision grossly intact. Gaze aligned appropriately.     Extraocular Movements: Extraocular movements intact.     Conjunctiva/sclera: Conjunctivae normal.  Cardiovascular:     Rate and Rhythm: Normal rate and regular rhythm.     Pulses: Normal pulses.     Heart  sounds: Normal heart sounds, S1 normal and S2 normal. No murmur heard.    No friction rub. No gallop.  Pulmonary:     Effort: Pulmonary effort is normal. No respiratory distress.     Breath sounds: Normal breath sounds.  Abdominal:     Palpations: Abdomen is soft.     Tenderness: There is no abdominal tenderness. There is no guarding or rebound.     Hernia: No hernia is present.  Musculoskeletal:        General: No swelling.     Cervical back: Full passive range of motion without pain, normal range of motion and neck supple. No pain with movement, spinous process tenderness or muscular tenderness. Normal range of motion.     Right lower leg: No edema.     Left lower leg: No edema.  Skin:    General: Skin is warm and dry.     Capillary Refill: Capillary refill takes less than 2 seconds.     Findings: No ecchymosis, erythema, lesion or wound.  Neurological:     Mental Status: He is alert and oriented to person, place, and time.     GCS: GCS eye subscore is 4. GCS verbal subscore is 5. GCS motor subscore is 6.     Cranial Nerves: Cranial nerves 2-12 are intact.     Sensory: Sensation is intact.     Motor: Motor function is intact. No weakness or abnormal muscle tone.     Coordination: Coordination is intact.     Comments: Extraocular muscle movement: normal No visual field cut Pupils: equal and reactive both direct and consensual response is normal No nystagmus present    Sensory function is intact to light touch, pinprick Proprioception intact  Grip strength 5/5 symmetric in upper extremities No pronator drift Normal finger to nose bilaterally  Lower extremity strength 5/5 against gravity Normal heel to shin bilaterally  Gait: normal   Psychiatric:        Mood and Affect: Mood normal.        Speech: Speech normal.        Behavior: Behavior normal.     ED Results / Procedures / Treatments   Labs (all labs ordered are listed, but only abnormal results are  displayed) Labs Reviewed  BASIC METABOLIC PANEL - Abnormal; Notable for the following components:      Result Value   Glucose, Bld 117 (*)    All other components within normal limits  VITAMIN B12 - Abnormal; Notable for the following components:   Vitamin B-12 120 (*)    All other components within normal limits  CBC  LIPID PANEL  TSH  HEMOGLOBIN A1C  TROPONIN I (HIGH SENSITIVITY)  TROPONIN I (HIGH SENSITIVITY)  TROPONIN I (HIGH SENSITIVITY)    EKG EKG Interpretation Date/Time:  Friday February 21 2023 22:59:08 EDT Ventricular Rate:  53 PR Interval:  186 QRS Duration:  156 QT Interval:  425 QTC Calculation: 399 R Axis:   40  Text Interpretation: Sinus rhythm Nonspecific intraventricular conduction delay No acute changes Confirmed by Gilda Crease [phone]) on 02/22/2023 12:30:44 AM  Radiology US Carotid Bilateral  Result Date: 02/22/2023 CLINICAL DATA:  Right-sided weakness, TIA EXAM: BILATERAL CAROTID DUPLEX ULTRASOUND TECHNIQUE: Wallace Cullens scale imaging, color Doppler and duplex ultrasound were performed of bilateral carotid and vertebral arteries in the neck. COMPARISON:  03/25/2005 FINDINGS: Criteria: Quantification of carotid stenosis is based on velocity parameters that correlate the residual internal carotid diameter with NASCET-based stenosis levels, using the diameter of the distal internal carotid lumen as the denominator for stenosis measurement. The following velocity measurements were obtained: RIGHT ICA: 86/17 cm/sec CCA: 74/16 cm/sec SYSTOLIC ICA/CCA RATIO:  1.2 ECA: 97 cm/sec LEFT ICA: 65/21 cm/sec CCA: 84/17 cm/sec SYSTOLIC ICA/CCA RATIO:  0.8 ECA: 69 cm/sec RIGHT CAROTID ARTERY: Mild smooth noncalcified plaque in the bulb and proximal ICA resulting in only mild stenosis. Normal waveforms and color Doppler signal throughout. RIGHT VERTEBRAL ARTERY:  Normal flow direction and waveform. LEFT CAROTID ARTERY: Scattered smooth plaque in the common carotid artery without  stenosis. Partially calcified plaque in the bulb resulting in mild stenosis. Normal waveforms and color Doppler signal throughout. LEFT VERTEBRAL ARTERY:  Normal flow direction and waveform. IMPRESSION: 1. 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 2. Antegrade bilateral vertebral arterial flow. Electronically Signed   By: Corlis Leak M.D.   On: 02/22/2023 09:42   MR BRAIN WO CONTRAST  Result Date: 02/22/2023 CLINICAL DATA:  Right-sided numbness.  Blurred vision EXAM: MRI HEAD WITHOUT CONTRAST TECHNIQUE: Multiplanar, multiecho pulse sequences of the brain and surrounding structures were obtained without intravenous contrast. COMPARISON:  Head CT from earlier today FINDINGS: Brain: No acute infarction, hemorrhage, hydrocephalus, extra-axial collection or mass lesion. Few chronic white matter insults, commonly seen to this degree in healthy patients of this age. Brain volume is normal. Vascular: Normal flow voids. Skull and upper cervical spine: Normal marrow signal. Sinuses/Orbits: Negative. IMPRESSION: Negative for acute infarct.  No explanation for symptoms. Electronically Signed   By: Tiburcio Pea M.D.   On: 02/22/2023 08:25   CT HEAD WO CONTRAST ( )  Result Date: 02/22/2023 CLINICAL DATA:  Old right side went numb. Left-sided chest pain. Difficulty breathing. EXAM: CT HEAD WITHOUT CONTRAST TECHNIQUE: Contiguous axial images were obtained from the base of the skull through the vertex without intravenous contrast. RADIATION DOSE REDUCTION: This exam was performed according to the departmental dose-optimization program which includes automated exposure control, adjustment of the mA and/or kV according to patient size and/or use of iterative reconstruction technique. COMPARISON:  MRI head report from 10/23/2009 FINDINGS: Brain: Asymmetric hypoattenuation within the posterior right frontal lobe (series 2/image 18). No intracranial hemorrhage or mass effect. No  hydrocephalus. Vascular: No hyperdense vessel or unexpected calcification. Skull: No acute fracture. Sinuses/Orbits: No acute abnormality. Other: None. IMPRESSION: Asymmetric hypoattenuation within the posterior right frontal lobe is favored artifactual but could be seen with acute or subacute infarct. If there is ongoing concern for stroke, MRI would be more definitive. These results were called by telephone at the time of interpretation on 02/22/2023 at 1:10 am to provider [facility] , who verbally acknowledged these results. Electronically Signed   By:  Minerva Fester M.D.   On: 02/22/2023 01:21   DG Chest Portable 1 View  Result Date: 02/21/2023 CLINICAL DATA:  Chest pain EXAM: PORTABLE CHEST 1 VIEW COMPARISON:  CT chest dated 10/22/2022 FINDINGS: Lungs are clear.  No pleural effusion or pneumothorax. The heart is normal in size. IMPRESSION: No acute cardiopulmonary disease. Electronically Signed   By: Charline Bills M.D.   On: 02/21/2023 23:40    Procedures Procedures    Medications Ordered in ED Medications  aspirin chewable tablet 324 mg (324 mg Oral Given 02/22/23 0210)  ondansetron (ZOFRAN) injection 4 mg (4 mg Intravenous Given 02/22/23 0210)  cyanocobalamin (VITAMIN B12) injection 1,000 mcg (1,000 mcg Intramuscular Given 02/22/23 1124)    ED Course/ Medical Decision Making/ A&P                                 Medical Decision Making Amount and/or Complexity of Data Reviewed External Data Reviewed: ECG. Labs: ordered. Decision-making details documented in ED Course. Radiology: ordered and independent interpretation performed. Decision-making details documented in ED Course. ECG/medicine tests: ordered and independent interpretation performed. Decision-making details documented in ED Course.  Risk OTC drugs. Prescription drug management. Decision regarding hospitalization.   Differential diagnosis considered includes, but not limited to: TIA; Stroke; ICH; Seizure;  electrolyte abnormality; hypoglycemia; ACS; Allergic reaction   Patient presented with complaints that were suspicious for TIA.  Patient had sudden onset of right-sided numbness and what sounds like weakness that has resolved.  He is now experiencing some left-sided symptoms including left-sided chest pain.    Cardiac evaluation is negative.  CT with possible hypodensity in the right frontal area, would not explain right sided numbness and tingling.  Because of this, neurology was consulted.  Recommendation was for admission for further workup.        Final Clinical Impression(s) / ED Diagnoses Final diagnoses:  None    Rx / DC Orders ED Discharge Orders          Ordered    cyanocobalamin 1000 MCG tablet  Daily        02/22/23 1052    Discharge instructions       Comments: - CT of the head was negative, MRI of the head was reviewed in detail, no signs of acute stroke -Bilateral carotid study, your neck carotid studies were within normal limits -Continue aspirin and your cholesterol medications -Follow-up with PCP and cardiologist as outpatient in 1-2 weeks   02/22/23 1047    Activity as tolerated - No restrictions        02/22/23 1047    Increase activity slowly        02/22/23 1047    Diet - low sodium heart healthy        02/22/23 1047    Call MD for:  persistant nausea and vomiting        02/22/23 1047    Call MD for:  severe uncontrolled pain        02/22/23 1047    Call MD for:  difficulty breathing, headache or visual disturbances        02/22/23 1047    Call MD for:  persistant dizziness or light-headedness        02/22/23 1047    Call MD for:  extreme fatigue        02/22/23 1047    Discharge instructions       Comments: Follow-up with  your cardiologist for further evaluation-May likely need a stress test, echocardiogram or Holter monitor for slow heart rate   02/22/23 1348              Yovanny Coats, Canary Brim, MD 02/23/23 [phone]

## 2023-02-22 NOTE — ED Notes (Signed)
PT at [facility].

## 2023-02-22 NOTE — Assessment & Plan Note (Signed)
-   Resolved at this time - Trope 4, 4 - EKG shows a heart rate of 53, sinus rhythm, QTc 399 - Monitor on telemetry - Patient was given 324 mg of aspirin in the ED - Continue to monitor

## 2023-02-22 NOTE — ED Notes (Signed)
Pt speaking with teleneuro

## 2023-02-22 NOTE — H&P (Signed)
History and Physical    Patient: Frank Weber DOB: Aug 19, 1950 DOA: 02/21/2023 DOS: the patient was seen and examined on 02/22/2023 PCP: Elfredia Nevins, MD  Patient coming from: Home  Chief Complaint:  Chief Complaint  Patient presents with   Chest Pain   Shortness of Breath   HPI: Frank Weber is a 72 y.o. male with medical history significant of GERD, heart murmur, hyperlipidemia, hypothyroidism, peripheral neuropathy, migraines, and more presents the ED with a chief complaint of paresthesias.  Patient reports that he was 9:30 PM that he was last known well.  Immediately after that he started to have paresthesias around his lips.  They then spread up his face to his eyes.  He took Benadryl thinking it was an allergic reaction.  The paresthesias eventually moved to the whole right side of his body with numbness and weakness associated.  Patient reports it went all the way down his legs to his toes, and he could not walk.  The paresthesias then went to his throat and his sinuses.  Next he experienced chest pain that was left-sided and went down his left arm.  Went all the way to his hand.  Patient reports that when the left-sided pain started the right-sided pain eased up.  He also had blurred vision during this time.  He could speak fine but had difficulty swallowing.  He reports that his right ear also felt muffled and like static.  He reports he had associated dyspnea.  He had a similar constellation of symptoms a couple years ago when he thought he was having a heart attack.  He reports he was never given a diagnosis for why he is feeling the symptoms.  Patient reports that he is back to baseline at this time.  Patient does not smoke, does not drink, is full code. Review of Systems: As mentioned in the history of present illness. All other systems reviewed and are negative. Past Medical History:  Diagnosis Date   Arthritis    right knee   Cancer (HCC)    Skin    Constipation    Frequency of urination    GERD (gastroesophageal reflux disease)    Heart murmur    "slight"    History of kidney stones    History of melanoma excision    2012--  NECK/ HEAD   History of squamous cell carcinoma excision    RIGHT EAR   Hyperlipidemia    Hypothyroidism    IBS (irritable bowel syndrome)    states slight   Lumbar stenosis    L5 - S1   Migraines    Peripheral neuropathy    legs and feet   Renal insufficiency    Right flank pain    Right ureteral stone    Urgency of urination    Wears glasses    Past Surgical History:  Procedure Laterality Date   BIOPSY  01/10/2021   Procedure: BIOPSY;  Surgeon: Dolores Frame, MD;  Location: [facility];  Service: Gastroenterology;;   CARDIAC CATHETERIZATION  07-08-2007  dr Jenne Campus    no significant CAD, preserved LVF, ef 50%//  30% pLAD   CARDIOVASCULAR STRESS TEST  06-20-2011  dr croitoru   Low risk study/  normal perfusion scan showing attenuation artifact in the anterior region of myocardium with no ischemia , infarct or scar/  normal LV funciton and wall motion , ef 62%   COLONOSCOPY N/A 02/17/2013   Rehman:examination performed to cecum. mild sigmoid colon  diverticulosis, 4 small polyps ablated via cold biopsy from rectosigmoid junction (hyperplastic). External hemorrhoids.   COLONOSCOPY WITH PROPOFOL N/A 01/10/2021   Procedure: COLONOSCOPY WITH PROPOFOL;  Surgeon: Dolores Frame, MD;  Location: [facility];  Service: Gastroenterology;  Laterality: N/A;   COLONOSCOPY WITH PROPOFOL N/A 01/01/2023   Procedure: COLONOSCOPY WITH PROPOFOL;  Surgeon: Dolores Frame, MD;  Location: [facility];  Service: Gastroenterology;  Laterality: N/A;  10:15am;asa 2   CYSTOSCOPY W/ URETERAL STENT PLACEMENT Right 06/18/2015   Procedure: CYSTOSCOPY WITH RIGHT RETROGRADE PYELOGRAM RIGHT URETERAL STENT PLACEMENT;  Surgeon: Bjorn Pippin, MD;  Location: [facility];  Service: Urology;  Laterality:  Right;   CYSTOSCOPY/URETEROSCOPY/HOLMIUM LASER/STENT PLACEMENT Right 06/27/2015   Procedure: CYSTOSCOPY RIGHT URETEROSCOPY  STENT REMOVAL; STONE EXTRACTION WITH BASKET;  Surgeon: Bjorn Pippin, MD;  Location: [facility];  Service: Urology;  Laterality: Right;   ESOPHAGOGASTRODUODENOSCOPY (EGD) WITH PROPOFOL N/A 01/10/2021   Procedure: ESOPHAGOGASTRODUODENOSCOPY (EGD) WITH PROPOFOL;  Surgeon: Dolores Frame, MD;  Location: [facility];  Service: Gastroenterology;  Laterality: N/A;  11:00, moved up per Soledad Gerlach - pt knows arrival time   HEAD & NECK SKIN LESION EXCISIONAL BIOPSY     HOLMIUM LASER APPLICATION Right 06/27/2015   Procedure: HOLMIUM LASER LITHOTRIPSY ;  Surgeon: Bjorn Pippin, MD;  Location: [facility];  Service: Urology;  Laterality: Right;   I & D EXTREMITY Right 07/14/2018   Procedure: IRRIGATION AND DEBRIDEMENT RIGHT KNEE WITH CLOSURE;  Surgeon: Yolonda Kida, MD;  Location: [facility];  Service: Orthopedics;  Laterality: Right;   INGUINAL HERNIA REPAIR Bilateral 10/21/2007   KNEE ARTHROSCOPY W/ ACL RECONSTRUCTION Right 1992   MENISCUS REPAIR Right    2019, dr Thomasena Edis    POLYPECTOMY  01/10/2021   Procedure: POLYPECTOMY;  Surgeon: Dolores Frame, MD;  [facility]: [facility];  Service: Gastroenterology;;   POLYPECTOMY  01/01/2023   Procedure: POLYPECTOMY INTESTINAL;  Surgeon: Dolores Frame, MD;  [facility]: [facility];  Service: Gastroenterology;;   SHOULDER ARTHROSCOPY WITH OPEN ROTATOR CUFF REPAIR Left 07/27/2014   Procedure: LEFT SHOULDER ARTHROSCOPY WITH MINI OPEN ROTATOR CUFF REPAIR;  Surgeon: Nestor Lewandowsky, MD;  Location: [facility];  Service: Orthopedics;  Laterality: Left;   SHOULDER ARTHROSCOPY WITH OPEN ROTATOR CUFF REPAIR Right 2006   TONSILLECTOMY AND ADENOIDECTOMY  age 89   TOTAL KNEE ARTHROPLASTY Right 07/10/2018   Procedure: TOTAL KNEE ARTHROPLASTY;  Surgeon: Eugenia Mcalpine, MD;   Location: [facility];  Service: Orthopedics;  Laterality: Right;   TRANSTHORACIC ECHOCARDIOGRAM  07/30/2007   normal LVF, ef >55%/  mild AR, MR , PR and TR/  mild AV sclerosis without stenosis/    URETEROSCOPY Right 06/18/2015   Procedure: URETEROSCOPY;  Surgeon: Bjorn Pippin, MD;  Location: [facility];  Service: Urology;  Laterality: Right;   Social History:  reports that he has been smoking cigars. He has been exposed to tobacco smoke. He has never used smokeless tobacco. He reports current alcohol use. He reports that he does not use drugs.  Allergies  Allergen Reactions   Sulfa Antibiotics Anaphylaxis, Shortness Of Breath and Rash   Coconut (Cocos Nucifera) Rash    Family History  Problem Relation Age of Onset   Colon cancer Other        Age of Onset-late 55's    Prior to Admission medications   Medication Sig Start Date End Date Taking? Authorizing Provider  ALPRAZOLAM XR 1 MG 24 hr tablet Take 1  mg by mouth at bedtime. 11/17/19   [provider]  aspirin EC 81 MG tablet Take 1 tablet (81 mg total) by mouth daily. Swallow whole. 12/24/19   Wendall Stade, MD  atorvastatin (LIPITOR) 20 MG tablet Take 20 mg by mouth daily. 11/09/19   [provider]  Cholecalciferol (DIALYVITE VITAMIN D 5000) 125 MCG (5000 UT) capsule Take 5,000 Units by mouth daily.    [provider]  gabapentin (NEURONTIN) 300 MG capsule Take 900 mg by mouth 4 (four) times daily.    [provider]  levothyroxine (SYNTHROID, LEVOTHROID) 25 MCG tablet Take 25 mcg by mouth daily before breakfast.    [provider]  linaclotide (LINZESS) 145 MCG CAPS capsule Take 145 mcg by mouth daily before breakfast.    [provider]  LORazepam (ATIVAN) 0.5 MG tablet Take 0.5 mg by mouth 3 (three) times daily as needed for anxiety.    [provider]  MAGNESIUM PO Take 1 tablet by mouth daily. With zinc and vitamin d    [provider]  mirtazapine (REMERON) 15 MG  tablet Take 1 tablet (15 mg total) by mouth at bedtime. 11/25/22   Carlan, Chelsea L, NP  nitroGLYCERIN (NITROSTAT) 0.4 MG SL tablet Place 1 tablet (0.4 mg total) under the tongue every 5 (five) minutes as needed. 07/18/21   Wendall Stade, MD  ondansetron (ZOFRAN) 4 MG tablet TAKE (1) TABLET BY MOUTH EVERY EIGHT HOURS AS NEEDED FOR NAUSEA 07/16/22   Carlan, Chelsea L, NP  Polyethyl Glycol-Propyl Glycol (SYSTANE ULTRA) 0.4-0.3 % SOLN Place 2 drops into both eyes as needed (for dry eyes).     [provider]  Probiotic Product (TRUBIOTICS PO) Take 1 capsule by mouth at bedtime.    [provider]  rizatriptan (MAXALT-MLT) 10 MG disintegrating tablet Take 10 mg by mouth as needed (for migraines and may repeat once in 2 hours, if no relief).     [provider]  triamcinolone cream (KENALOG) 0.1 % Apply 1 Application topically 2 (two) times daily. 12/27/22   [provider]  Vitamin D-Vitamin K (VITAMIN K2-VITAMIN D3 PO) Take 1 drop by mouth once a week.    [provider]    Physical Exam: Vitals:   02/22/23 0445 02/22/23 0500 02/22/23 0515 02/22/23 0545  BP: 108/72 110/72 109/73 106/73  Pulse: (!) 41 (!) 42 (!) 41 (!) 44  Resp: 15 15 16 17   Temp:      TempSrc:      SpO2: 94% 95% 96% 96%  Weight:      Height:       1.  General: Patient lying supine in bed,  no acute distress   2. Psychiatric: Alert and oriented x 3, mood and behavior normal for situation, pleasant and cooperative with exam   3. Neurologic: Speech and language are normal, face is symmetric, moves all 4 extremities voluntarily, at baseline without acute deficits on limited exam   4. HEENMT:  Head is atraumatic, normocephalic, pupils reactive to light, neck is supple, trachea is midline, mucous membranes are moist   5. Respiratory : Lungs are clear to auscultation bilaterally without wheezing, rhonchi, rales, no cyanosis, no increase in work of breathing or accessory muscle use    6. Cardiovascular : Heart rate normal, rhythm is regular, murmur present but no rubs or gallops, no peripheral edema, peripheral pulses palpated   7. Gastrointestinal:  Abdomen is soft, nondistended, nontender to palpation bowel sounds active, no  masses or organomegaly palpated   8. Skin:  Skin is warm, dry and intact without rashes, acute lesions, or ulcers on limited exam   9.Musculoskeletal:  No acute deformities or trauma, no asymmetry in tone, no peripheral edema, peripheral pulses palpated, no tenderness to palpation in the extremities  Data Reviewed: In the ED Temp 98.2, heart rate 42-57, respiratory rate 11-23, blood pressure 96/50 2-20 34/81, satting 94-98% No leukocytosis, hemoglobin 16.2, platelets 190 Chemistry is unremarkable Trope 4, 4 CT head shows acute or subacute infarct versus artifact in the right posterior frontal lobe EKG shows a heart rate of 53, sinus rhythm, QTc 399 Chest x-ray shows no acute disease Neurology was consulted and recommended aspirin, admit, TIA workup Admission requested for TIA  Assessment and Plan: * TIA (transient ischemic attack) - Weird constellation of symptoms - CT head shows acute or subacute infarct versus artifact in the posterior right frontal lobe - Neuro was consulted and while this location is not consistent with the symptoms, they advised patient to be admitted for TIA workup - Echo in the a.m. - MRI in the a.m. - PT OT ST eval and treat - Lipid panel and hemoglobin A1c in the a.m. - Continue to monitor   Hypothyroidism - Continue Synthroid - Given the nonspecific nature of the symptoms, check TSH  Mixed hyperlipidemia - Continue statin  Chest pain - Resolved at this time - Trope 4, 4 - EKG shows a heart rate of 53, sinus rhythm, QTc 399 - Monitor on telemetry - Patient was given 324 mg of aspirin in the ED - Continue to monitor      Advance Care Planning:   Code Status: Full Code  Consults:  Neurology  Family Communication: No family at bedside  Severity of Illness: The appropriate patient status for this patient is OBSERVATION. Observation status is judged to be reasonable and necessary in order to provide the required intensity of service to ensure the patient's safety. The patient's presenting symptoms, physical exam findings, and initial radiographic and laboratory data in the context of their medical condition is felt to place them at decreased risk for further clinical deterioration. Furthermore, it is anticipated that the patient will be medically stable for discharge from the hospital within 2 midnights of admission.   Author: Lilyan Gilford, DO 02/22/2023 6:04 AM  For on call review [email or web address].

## 2023-02-22 NOTE — Discharge Summary (Addendum)
Physician Discharge Summary   Patient: Frank Weber MRN: [id number] DOB: 23-Jul-1950  Admit date:     02/21/2023  Discharge date: 02/22/23  Discharge Physician: Kendell Bane   PCP: Elfredia Nevins, MD   Recommendations at discharge:    CT of the head was negative, MRI of the head was reviewed in detail, no signs of acute stroke -Bilateral carotid study, your neck carotid studies were within normal limits -Continue aspirin and your cholesterol medications -Follow-up with PCP and cardiologist as outpatient in 1-2 weeks Follow-up with your cardiologist for further evaluation-May likely need a stress test, echocardiogram or Holter monitor for slow heart rate  Discharge Diagnoses: Principal Problem:   TIA (transient ischemic attack) Active Problems:   Chest pain   Mixed hyperlipidemia   Hypothyroidism  Resolved Problems:   * No resolved hospital problems. *  Hospital Course: Frank Weber is a 72 y.o. male with medical history significant of GERD, heart murmur, hyperlipidemia, hypothyroidism, peripheral neuropathy, migraines, and more presents the ED with a chief complaint of paresthesias.  Patient reports that he was 9:30 PM that he was last known well.  Immediately after that he started to have paresthesias around his lips.  They then spread up his face to his eyes.  He took Benadryl thinking it was an allergic reaction.  The paresthesias eventually moved to the whole right side of his body with numbness and weakness associated.  Patient reports it went all the way down his legs to his toes, and he could not walk.  The paresthesias then went to his throat and his sinuses.  Next he experienced chest pain that was left-sided and went down his left arm.  Went all the way to his hand.  Patient reports that when the left-sided pain started the right-sided pain eased up.  He also had blurred vision during this time.  He could speak fine but had difficulty swallowing.  He reports that his  right ear also felt muffled and like static.  He reports he had associated dyspnea.  He had a similar constellation of symptoms a couple years ago when he thought he was having a heart attack.  He reports he was never given a diagnosis for why he is feeling the symptoms.  Patient reports that he is back to baseline at this time   All his symptoms has resolved now..   * TIA (transient ischemic attack) - Symptoms has resolved -TIA workup completed - CT head shows acute or subacute infarct versus artifact in the posterior right frontal lobe - Neuro was consulted and while this location is not consistent with the symptoms,  - [place] Bilateral carotid studies-within normal limits   - MRI -no signs of acute stroke, within normal limits - PT OT ST eval and treat - Lipid panel: LDL 81, - Continue to monitor - Echo: Patient deferred echocardiogram due to delay-will follow-up with his cardiologist for further evaluation Echocardiogram December 2022 has been reviewed, within normal limits  Low level B12: -Likely contributing to his symptoms, not confirmatory -Supplement B12 initiated  hypothyroidism - Continue Synthroid - TSH 2.94 within normal limits  Mixed hyperlipidemia - Continue statin -LDL 81  Chest pain - Resolved at this time - Trope 4, 4 - EKG shows a heart rate of 53, sinus rhythm, QTc 399 - Continue aspirin     Consultants: Neurologist Procedures performed: CT/MRI of the brain Disposition: Home Diet recommendation:  Discharge Diet Orders (From admission, onward)     Start  Ordered   02/22/23 0000  Diet - low sodium heart healthy        02/22/23 1047           Cardiac diet DISCHARGE MEDICATION: Allergies as of 02/22/2023       Reactions   Sulfa Antibiotics Anaphylaxis, Shortness Of Breath, Rash   Coconut (cocos Nucifera) Rash        Medication List     TAKE these medications    ALPRAZolam XR 1 MG 24 hr tablet Generic drug: ALPRAZolam Take 1 mg by  mouth at bedtime.   aspirin EC 81 MG tablet Take 1 tablet (81 mg total) by mouth daily. Swallow whole.   atorvastatin 20 MG tablet Commonly known as: LIPITOR Take 20 mg by mouth daily.   Dialyvite Vitamin D 5000 125 MCG (5000 UT) capsule Generic drug: Cholecalciferol Take 5,000 Units by mouth daily.   gabapentin 300 MG capsule Commonly known as: NEURONTIN Take 900 mg by mouth 4 (four) times daily.   levothyroxine 25 MCG tablet Commonly known as: SYNTHROID Take 25 mcg by mouth daily before breakfast.   Linzess 145 MCG Caps capsule Generic drug: linaclotide Take 145 mcg by mouth daily before breakfast.   LORazepam 0.5 MG tablet Commonly known as: ATIVAN Take 0.5 mg by mouth 3 (three) times daily as needed for anxiety.   MAGNESIUM PO Take 1 tablet by mouth daily. With zinc and vitamin d   mirtazapine 15 MG tablet Commonly known as: REMERON Take 1 tablet (15 mg total) by mouth at bedtime.   nitroGLYCERIN 0.4 MG SL tablet Commonly known as: NITROSTAT Place 1 tablet (0.4 mg total) under the tongue every 5 (five) minutes as needed.   ondansetron 4 MG tablet Commonly known as: ZOFRAN TAKE (1) TABLET BY MOUTH EVERY EIGHT HOURS AS NEEDED FOR NAUSEA   rizatriptan 10 MG disintegrating tablet Commonly known as: MAXALT-MLT Take 10 mg by mouth as needed (for migraines and may repeat once in 2 hours, if no relief).   Systane Ultra 0.4-0.3 % Soln Generic drug: Polyethyl Glycol-Propyl Glycol Place 2 drops into both eyes as needed (for dry eyes).   triamcinolone cream 0.1 % Commonly known as: KENALOG Apply 1 Application topically 2 (two) times daily.   TRUBIOTICS PO Take 1 capsule by mouth at bedtime.   VITAMIN K2-VITAMIN D3 PO Take 1 drop by mouth once a week.        Discharge Exam: Filed Weights   02/21/23 2257  Weight: 93 kg        General:  AAO x 3,  cooperative, no distress;   HEENT:  Normocephalic, PERRL, otherwise with in Normal limits   Neuro:   CNII-XII intact. , normal motor and sensation, reflexes intact   Lungs:   Clear to auscultation BL, Respirations unlabored,  No wheezes / crackles  Cardio:    S1/S2, RRR, No murmure, No Rubs or Gallops   Abdomen:  Soft, non-tender, bowel sounds active all four quadrants, no guarding or peritoneal signs.  Muscular  skeletal:  Limited exam -global generalized weaknesses - in bed, able to move all 4 extremities,   2+ pulses,  symmetric, No pitting edema  Skin:  Dry, warm to touch, negative for any Rashes,  Wounds: Please see nursing documentation          Condition at discharge: good  The results of significant diagnostics from this hospitalization (including imaging, microbiology, ancillary and laboratory) are listed below for reference.   Imaging Studies: US Carotid Bilateral  Result Date: 02/22/2023 CLINICAL DATA:  Right-sided weakness, TIA EXAM: BILATERAL CAROTID DUPLEX ULTRASOUND TECHNIQUE: Wallace Cullens scale imaging, color Doppler and duplex ultrasound were performed of bilateral carotid and vertebral arteries in the neck. COMPARISON:  03/25/2005 FINDINGS: Criteria: Quantification of carotid stenosis is based on velocity parameters that correlate the residual internal carotid diameter with NASCET-based stenosis levels, using the diameter of the distal internal carotid lumen as the denominator for stenosis measurement. The following velocity measurements were obtained: RIGHT ICA: 86/17 cm/sec CCA: 74/16 cm/sec SYSTOLIC ICA/CCA RATIO:  1.2 ECA: 97 cm/sec LEFT ICA: 65/21 cm/sec CCA: 84/17 cm/sec SYSTOLIC ICA/CCA RATIO:  0.8 ECA: 69 cm/sec RIGHT CAROTID ARTERY: Mild smooth noncalcified plaque in the bulb and proximal ICA resulting in only mild stenosis. Normal waveforms and color Doppler signal throughout. RIGHT VERTEBRAL ARTERY:  Normal flow direction and waveform. LEFT CAROTID ARTERY: Scattered smooth plaque in the common carotid artery without stenosis. Partially calcified plaque in the bulb  resulting in mild stenosis. Normal waveforms and color Doppler signal throughout. LEFT VERTEBRAL ARTERY:  Normal flow direction and waveform. IMPRESSION: 1. 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 2. Antegrade bilateral vertebral arterial flow. Electronically Signed   By: Corlis Leak M.D.   On: 02/22/2023 09:42   MR BRAIN WO CONTRAST  Result Date: 02/22/2023 CLINICAL DATA:  Right-sided numbness.  Blurred vision EXAM: MRI HEAD WITHOUT CONTRAST TECHNIQUE: Multiplanar, multiecho pulse sequences of the brain and surrounding structures were obtained without intravenous contrast. COMPARISON:  Head CT from earlier today FINDINGS: Brain: No acute infarction, hemorrhage, hydrocephalus, extra-axial collection or mass lesion. Few chronic white matter insults, commonly seen to this degree in healthy patients of this age. Brain volume is normal. Vascular: Normal flow voids. Skull and upper cervical spine: Normal marrow signal. Sinuses/Orbits: Negative. IMPRESSION: Negative for acute infarct.  No explanation for symptoms. Electronically Signed   By: Tiburcio Pea M.D.   On: 02/22/2023 08:25   CT HEAD WO CONTRAST ( )  Result Date: 02/22/2023 CLINICAL DATA:  Old right side went numb. Left-sided chest pain. Difficulty breathing. EXAM: CT HEAD WITHOUT CONTRAST TECHNIQUE: Contiguous axial images were obtained from the base of the skull through the vertex without intravenous contrast. RADIATION DOSE REDUCTION: This exam was performed according to the departmental dose-optimization program which includes automated exposure control, adjustment of the mA and/or kV according to patient size and/or use of iterative reconstruction technique. COMPARISON:  MRI head report from 10/23/2009 FINDINGS: Brain: Asymmetric hypoattenuation within the posterior right frontal lobe (series 2/image 18). No intracranial hemorrhage or mass effect. No hydrocephalus. Vascular: No hyperdense vessel or  unexpected calcification. Skull: No acute fracture. Sinuses/Orbits: No acute abnormality. Other: None. IMPRESSION: Asymmetric hypoattenuation within the posterior right frontal lobe is favored artifactual but could be seen with acute or subacute infarct. If there is ongoing concern for stroke, MRI would be more definitive. These results were called by telephone at the time of interpretation on 02/22/2023 at 1:10 am to provider [facility] , who verbally acknowledged these results. Electronically Signed   By: Minerva Fester M.D.   On: 02/22/2023 01:21   DG Chest Portable 1 View  Result Date: 02/21/2023 CLINICAL DATA:  Chest pain EXAM: PORTABLE CHEST 1 VIEW COMPARISON:  CT chest dated 10/22/2022 FINDINGS: Lungs are clear.  No pleural effusion or pneumothorax. The heart is normal in size. IMPRESSION: No acute cardiopulmonary disease. Electronically Signed   By: Charline Bills M.D.   On: 02/21/2023 23:40    Microbiology:  Results for orders placed or performed during the hospital encounter of 06/07/21  Resp Panel by RT-PCR (Flu A&B, Covid) Nasopharyngeal Swab     Status: None   Collection Time: 06/07/21 12:52 PM   Specimen: Nasopharyngeal Swab; Nasopharyngeal(NP) swabs in vial transport medium  Result Value Ref Range Status   SARS Coronavirus 2 by RT PCR NEGATIVE NEGATIVE Final    Comment: (NOTE) SARS-CoV-2 target nucleic acids are NOT DETECTED.  The SARS-CoV-2 RNA is generally detectable in upper respiratory specimens during the acute phase of infection. The lowest concentration of SARS-CoV-2 viral copies this assay can detect is 138 copies/mL. A negative result does not preclude SARS-Cov-2 infection and should not be used as the sole basis for treatment or other patient management decisions. A negative result may occur with  improper specimen collection/handling, submission of specimen other than nasopharyngeal swab, presence of viral mutation(s) within the areas targeted by this  assay, and inadequate number of viral copies(<138 copies/mL). A negative result must be combined with clinical observations, patient history, and epidemiological information. The expected result is Negative.  Fact Sheet for Patients:  BloggerCourse.com  Fact Sheet for Healthcare Providers:  SeriousBroker.it  This test is no t yet approved or cleared by the [place] FDA and  has been authorized for detection and/or diagnosis of SARS-CoV-2 by FDA under an Emergency Use Authorization (EUA). This EUA will remain  in effect (meaning this test can be used) for the duration of the COVID-19 declaration under Section 564(b)(1) of the Act, 21 U.S.C.section 360bbb-3(b)(1), unless the authorization is terminated  or revoked sooner.       Influenza A by PCR NEGATIVE NEGATIVE Final   Influenza B by PCR NEGATIVE NEGATIVE Final    Comment: (NOTE) The Xpert Xpress SARS-CoV-2/FLU/RSV plus assay is intended as an aid in the diagnosis of influenza from Nasopharyngeal swab specimens and should not be used as a sole basis for treatment. Nasal washings and aspirates are unacceptable for Xpert Xpress SARS-CoV-2/FLU/RSV testing.  Fact Sheet for Patients: BloggerCourse.com  Fact Sheet for Healthcare Providers: SeriousBroker.it  This test is not yet approved or cleared by the [place] FDA and has been authorized for detection and/or diagnosis of SARS-CoV-2 by FDA under an Emergency Use Authorization (EUA). This EUA will remain in effect (meaning this test can be used) for the duration of the COVID-19 declaration under Section 564(b)(1) of the Act, 21 U.S.C. section 360bbb-3(b)(1), unless the authorization is terminated or revoked.  Performed at [facility], [address], [place], [place] [address]     Labs: CBC: Recent Labs  Lab 02/21/23 2300  WBC 5.9  HGB 16.2  HCT 47.5  MCV  95.4  PLT 190   Basic Metabolic Panel: Recent Labs  Lab 02/21/23 2300  NA 137  K 3.7  CL 104  CO2 23  GLUCOSE 117*  BUN 12  CREATININE 1.23  CALCIUM 9.2   Liver Function Tests: No results for input(s): "AST", "ALT", "ALKPHOS", "BILITOT", "PROT", "ALBUMIN" in the last 168 hours. CBG: No results for input(s): "GLUCAP" in the last 168 hours.  Discharge time spent: greater than 30 minutes.  Signed: Kendell Bane, MD [facility] 02/22/2023

## 2023-02-22 NOTE — ED Notes (Signed)
Pt back from MRI 

## 2023-02-22 NOTE — Assessment & Plan Note (Signed)
Continue statin. 

## 2023-02-22 NOTE — ED Notes (Signed)
Ambulatory with steady gait with PT

## 2023-02-24 LAB — HEMOGLOBIN A1C
Hgb A1c MFr Bld: 5.6 % (ref 4.8–5.6)
Mean Plasma Glucose: 114 mg/dL

## 2023-02-25 ENCOUNTER — Encounter: Payer: Self-pay | Admitting: Adult Health

## 2023-02-25 ENCOUNTER — Ambulatory Visit: Payer: Medicare Other | Attending: Adult Health

## 2023-02-25 ENCOUNTER — Ambulatory Visit: Payer: Medicare Other | Attending: Adult Health | Admitting: Adult Health

## 2023-02-25 VITALS — BP 104/72 | HR 52 | Ht 75.0 in | Wt 211.0 lb

## 2023-02-25 DIAGNOSIS — I639 Cerebral infarction, unspecified: Secondary | ICD-10-CM

## 2023-02-25 DIAGNOSIS — I495 Sick sinus syndrome: Secondary | ICD-10-CM

## 2023-02-25 NOTE — Patient Instructions (Signed)
Medication Instructions:  No Changes *If you need a refill on your cardiac medications before your next appointment, please call your pharmacy*   Lab Work: No Labs If you have labs (blood work) drawn today and your tests are completely normal, you will receive your results only by: MyChart Message (if you have MyChart) OR A paper copy in the mail If you have any lab test that is abnormal or we need to change your treatment, we will call you to review the results.   Testing/Procedures: [facility] Your physician has requested that you have an echocardiogram. Echocardiography is a painless test that uses sound waves to create images of your heart. It provides your doctor with information about the size and shape of your heart and how well your heart's chambers and valves are working. This procedure takes approximately one hour. There are no restrictions for this procedure. Please do NOT wear cologne, perfume, aftershave, or lotions (deodorant is allowed). Please arrive 15 minutes prior to your appointment time.   ZIO XT- Long Term Monitor Instructions  Your physician has requested you wear a ZIO patch monitor for 7 days.  This is a single patch monitor. [employer] supplies one patch monitor per enrollment. Additional stickers are not available. Please do not apply patch if you will be having a Nuclear Stress Test,  Echocardiogram, Cardiac CT, MRI, or Chest Xray during the period you would be wearing the  monitor. The patch cannot be worn during these tests. You cannot remove and re-apply the  ZIO XT patch monitor.  Your ZIO patch monitor will be mailed 3 day USPS to your address on file. It may take 3-5 days  to receive your monitor after you have been enrolled.  Once you have received your monitor, please review the enclosed instructions. Your monitor  has already been registered assigning a specific monitor serial # to you.  Billing and Patient Assistance Program Information  We  have supplied [employer] with any of your insurance information on file for billing purposes. [employer] offers a sliding scale Patient Assistance Program for patients that do not have  insurance, or whose insurance does not completely cover the cost of the ZIO monitor.  You must apply for the Patient Assistance Program to qualify for this discounted rate.  To apply, please call [employer] at [phone], select option 4, select option 2, ask to apply for  Patient Assistance Program. Meredeth Ide will ask your household income, and how many people  are in your household. They will quote your out-of-pocket cost based on that information.  [employer] will also be able to set up a 53-month, interest-free payment plan if needed.  Applying the monitor   Shave hair from upper left chest.  Hold abrader disc by orange tab. Rub abrader in 40 strokes over the upper left chest as  indicated in your monitor instructions.  Clean area with 4 enclosed alcohol pads. Let dry.  Apply patch as indicated in monitor instructions. Patch will be placed under collarbone on left  side of chest with arrow pointing upward.  Rub patch adhesive wings for 2 minutes. Remove white label marked "1". Remove the white  label marked "2". Rub patch adhesive wings for 2 additional minutes.  While looking in a mirror, press and release button in center of patch. A small green light will  flash 3-4 times. This will be your only indicator that the monitor has been turned on.  Do not shower for the first 24 hours. You may  shower after the first 24 hours.  Press the button if you feel a symptom. You will hear a small click. Record Date, Time and  Symptom in the Patient Logbook.  When you are ready to remove the patch, follow instructions on the last 2 pages of Patient  Logbook. Stick patch monitor onto the last page of Patient Logbook.  Place Patient Logbook in the blue and white box. Use locking tab on box and tape box closed  securely. The blue  and white box has prepaid postage on it. Please place it in the mailbox as  soon as possible. Your physician should have your test results approximately 7 days after the  monitor has been mailed back to [facility].  Call [facility] Customer Care at [phone] if you have questions regarding  your ZIO XT patch monitor. Call them immediately if you see an orange light blinking on your  monitor.  If your monitor falls off in less than 4 days, contact our Monitor department at [phone].  If your monitor becomes loose or falls off after 4 days call [employer] at [phone] for  suggestions on securing your monitor   Follow-Up: At [facility], you and your health needs are our priority.  As part of our continuing mission to provide you with exceptional heart care, we have created designated Provider Care Teams.  These Care Teams include your primary Cardiologist (physician) and Advanced Practice Providers (APPs -  Physician Assistants and Nurse Practitioners) who all work together to provide you with the care you need, when you need it.  We recommend signing up for the patient portal called "MyChart".  Sign up information is provided on this After Visit Summary.  MyChart is used to connect with patients for Virtual Visits (Telemedicine).  Patients are able to view lab/test results, encounter notes, upcoming appointments, etc.  Non-urgent messages can be sent to your provider as well.   To learn more about what you can do with MyChart, go to [email or web address].    Your next appointment:   4 -6week(s)  Provider:   Charlton Haws, MD or Joni Reining, ANP, DNP

## 2023-02-25 NOTE — Progress Notes (Unsigned)
Enrolled for [employer] to mail a ZIO XT long term holter monitor to the patients address on file.   Dr. Eden Emms to read.

## 2023-02-25 NOTE — Progress Notes (Signed)
Cardiology Clinic Note   Patient Name: Frank Weber Date of Encounter: 02/25/2023  Primary Care Provider:  Elfredia Nevins, MD Primary Cardiologist:  Charlton Haws, MD  Patient Profile    72 y.o. male with medical history significant of GERD, heart murmur, hyperlipidemia, hypothyroidism, peripheral neuropathy, migraines. Admitted 02/21/2023--02/22/2023 Admitted with TIA, chest pain left sided arm pain. He had dysphasia. CT scan of the head acute or subacute infarct versus artifact in the posterior right frontal lobe MRI of the head revealed revealing no signs of acute stroke, He was ruled out for ACS. Carotid studies found to have <50% diameter ICA stenosis.  Echo was completed in December 2022 without any evidence of PFO with normal LVEF of 60 to 65%.  He was found to have bradycardia.  Consideration for repeat echocardiogram cardiac monitor, and or  stress test with cardiology follow-up.  Past Medical History    Past Medical History:  Diagnosis Date   Arthritis    right knee   Cancer (HCC)    Skin   Constipation    Frequency of urination    GERD (gastroesophageal reflux disease)    Heart murmur    "slight"    History of kidney stones    History of melanoma excision    2012--  NECK/ HEAD   History of squamous cell carcinoma excision    RIGHT EAR   Hyperlipidemia    Hypothyroidism    IBS (irritable bowel syndrome)    states slight   Lumbar stenosis    L5 - S1   Migraines    Peripheral neuropathy    legs and feet   Renal insufficiency    Right flank pain    Right ureteral stone    Urgency of urination    Wears glasses    Past Surgical History:  Procedure Laterality Date   BIOPSY  01/10/2021   Procedure: BIOPSY;  Surgeon: Dolores Frame, MD;  [facility];  Service: Gastroenterology;;   CARDIAC CATHETERIZATION  07-08-2007  dr Jenne Campus    no significant CAD, preserved LVF, ef 50%//  30% pLAD   CARDIOVASCULAR STRESS TEST  06-20-2011  dr croitoru    Low risk study/  normal perfusion scan showing attenuation artifact in the anterior region of myocardium with no ischemia , infarct or scar/  normal LV funciton and wall motion , ef 62%   COLONOSCOPY N/A 02/17/2013   Rehman:examination performed to cecum. mild sigmoid colon diverticulosis, 4 small polyps ablated via cold biopsy from rectosigmoid junction (hyperplastic). External hemorrhoids.   COLONOSCOPY WITH PROPOFOL N/A 01/10/2021   Procedure: COLONOSCOPY WITH PROPOFOL;  Surgeon: Dolores Frame, MD;  [facility];  Service: Gastroenterology;  Laterality: N/A;   COLONOSCOPY WITH PROPOFOL N/A 01/01/2023   Procedure: COLONOSCOPY WITH PROPOFOL;  Surgeon: Dolores Frame, MD;  [facility];  Service: Gastroenterology;  Laterality: N/A;  10:15am;asa 2   CYSTOSCOPY W/ URETERAL STENT PLACEMENT Right 06/18/2015   Procedure: CYSTOSCOPY WITH RIGHT RETROGRADE PYELOGRAM RIGHT URETERAL STENT PLACEMENT;  Surgeon: Bjorn Pippin, MD;  Location: [facility];  Service: Urology;  Laterality: Right;   CYSTOSCOPY/URETEROSCOPY/HOLMIUM LASER/STENT PLACEMENT Right 06/27/2015   Procedure: CYSTOSCOPY RIGHT URETEROSCOPY  STENT REMOVAL; STONE EXTRACTION WITH BASKET;  Surgeon: Bjorn Pippin, MD;  Location: [facility];  Service: Urology;  Laterality: Right;   ESOPHAGOGASTRODUODENOSCOPY (EGD) WITH PROPOFOL N/A 01/10/2021   Procedure: ESOPHAGOGASTRODUODENOSCOPY (EGD) WITH PROPOFOL;  Surgeon: Dolores Frame, MD;  [facility];  Service: Gastroenterology;  Laterality: N/A;  11:00, moved up per Soledad Gerlach - pt knows arrival time   HEAD & NECK SKIN LESION EXCISIONAL BIOPSY     HOLMIUM LASER APPLICATION Right 06/27/2015   Procedure: HOLMIUM LASER LITHOTRIPSY ;  Surgeon: Bjorn Pippin, MD;  Location: [facility];  Service: Urology;  Laterality: Right;   I & D EXTREMITY Right 07/14/2018   Procedure: IRRIGATION AND DEBRIDEMENT RIGHT KNEE WITH CLOSURE;   Surgeon: Yolonda Kida, MD;  Location: [facility];  Service: Orthopedics;  Laterality: Right;   INGUINAL HERNIA REPAIR Bilateral 10/21/2007   KNEE ARTHROSCOPY W/ ACL RECONSTRUCTION Right 1992   MENISCUS REPAIR Right    2019, dr Thomasena Edis    POLYPECTOMY  01/10/2021   Procedure: POLYPECTOMY;  Surgeon: Dolores Frame, MD;  [facility];  Service: Gastroenterology;;   POLYPECTOMY  01/01/2023   Procedure: POLYPECTOMY INTESTINAL;  Surgeon: Dolores Frame, MD;  [facility];  Service: Gastroenterology;;   SHOULDER ARTHROSCOPY WITH OPEN ROTATOR CUFF REPAIR Left 07/27/2014   Procedure: LEFT SHOULDER ARTHROSCOPY WITH MINI OPEN ROTATOR CUFF REPAIR;  Surgeon: Nestor Lewandowsky, MD;  Location: [facility];  Service: Orthopedics;  Laterality: Left;   SHOULDER ARTHROSCOPY WITH OPEN ROTATOR CUFF REPAIR Right 2006   TONSILLECTOMY AND ADENOIDECTOMY  age 72   TOTAL KNEE ARTHROPLASTY Right 07/10/2018   Procedure: TOTAL KNEE ARTHROPLASTY;  Surgeon: Eugenia Mcalpine, MD;  Location: [facility];  Service: Orthopedics;  Laterality: Right;   TRANSTHORACIC ECHOCARDIOGRAM  07/30/2007   normal LVF, ef >55%/  mild AR, MR , PR and TR/  mild AV sclerosis without stenosis/    URETEROSCOPY Right 06/18/2015   Procedure: URETEROSCOPY;  Surgeon: Bjorn Pippin, MD;  Location: [facility];  Service: Urology;  Laterality: Right;    Allergies  Allergies  Allergen Reactions   Sulfa Antibiotics Anaphylaxis, Shortness Of Breath and Rash   Coconut (Cocos Nucifera) Rash    History of Present Illness    [name] comes today feeling well.  He is still confused on what may have caused his episode leading to ED visit.  He does not have any residual weakness in his arms, or leg.  He has had no recurrent chest pain.  He has had no headaches.  He denies palpitations, dizziness, near-syncope.  He has had no medication changes, environmental changes, or exposure to any toxins.  He had been working  with some pain thinners earlier that day.  Uncertain if this might have been etiology.  Home Medications    Current Outpatient Medications  Medication Sig Dispense Refill   ALPRAZOLAM XR 1 MG 24 hr tablet Take 1 mg by mouth at bedtime.     aspirin EC 81 MG tablet Take 1 tablet (81 mg total) by mouth daily. Swallow whole. 90 tablet 3   atorvastatin (LIPITOR) 20 MG tablet Take 20 mg by mouth daily.     Cholecalciferol (DIALYVITE VITAMIN D 5000) 125 MCG (5000 UT) capsule Take 5,000 Units by mouth daily.     cyanocobalamin 1000 MCG tablet Take 1 tablet (1,000 mcg total) by mouth daily. 30 tablet 0   gabapentin (NEURONTIN) 300 MG capsule Take 900 mg by mouth 4 (four) times daily.     levothyroxine (SYNTHROID, LEVOTHROID) 25 MCG tablet Take 25 mcg by mouth daily before breakfast.     linaclotide (LINZESS) 145 MCG CAPS capsule Take 145 mcg by mouth daily before breakfast.     LORazepam (ATIVAN) 0.5 MG tablet Take 0.5 mg  by mouth 3 (three) times daily as needed for anxiety.     mirtazapine (REMERON) 15 MG tablet Take 1 tablet (15 mg total) by mouth at bedtime. 90 tablet 1   nitroGLYCERIN (NITROSTAT) 0.4 MG SL tablet Place 1 tablet (0.4 mg total) under the tongue every 5 (five) minutes as needed. 25 tablet 3   ondansetron (ZOFRAN) 4 MG tablet TAKE (1) TABLET BY MOUTH EVERY EIGHT HOURS AS NEEDED FOR NAUSEA (Patient taking differently: Take 4 mg by mouth every 8 (eight) hours as needed for nausea or vomiting.) 20 tablet 1   Polyethyl Glycol-Propyl Glycol (SYSTANE ULTRA) 0.4-0.3 % SOLN Place 2 drops into both eyes as needed (for dry eyes).      Probiotic Product (TRUBIOTICS PO) Take 1 capsule by mouth at bedtime.     rizatriptan (MAXALT-MLT) 10 MG disintegrating tablet Take 10 mg by mouth as needed (for migraines and may repeat once in 2 hours, if no relief).      triamcinolone cream (KENALOG) 0.1 % Apply 1 Application topically 2 (two) times daily.     Vitamin D-Vitamin K (VITAMIN K2-VITAMIN D3 PO) Take 1  drop by mouth once a week.     No current facility-administered medications for this visit.     Family History    Family History  Problem Relation Age of Onset   Colon cancer Other        Age of Onset-late 4's   He indicated that his mother is deceased. He indicated that his father is deceased. He indicated that his sister is alive. He indicated that his brother is alive. He indicated that his other is deceased.  Social History    Social History   Socioeconomic History   Marital status: Married    Spouse name: Not on file   Number of children: Not on file   Years of education: Not on file   Highest education level: Not on file  Occupational History   Not on file  Tobacco Use   Smoking status: Some Days    Types: Cigars    Passive exposure: Current   Smokeless tobacco: Never   Tobacco comments:    Some days smokes cigars but not frequent.   Vaping Use   Vaping status: Never Used  Substance and Sexual Activity   Alcohol use: Yes    Comment: seldom    Drug use: No   Sexual activity: Not on file  Other Topics Concern   Not on file  Social History Narrative   Not on file   Social Determinants of Health   Financial Resource Strain: Not on file  Food Insecurity: Not on file  Transportation Needs: Not on file  Physical Activity: Not on file  Stress: Not on file  Social Connections: Not on file  Intimate Partner Violence: Not on file     Review of Systems    General:  No chills, fever, night sweats or weight changes.  Cardiovascular:  No chest pain, dyspnea on exertion, edema, orthopnea, palpitations, paroxysmal nocturnal dyspnea. Dermatological: No rash, lesions/masses Respiratory: No cough, dyspnea Urologic: No hematuria, dysuria Abdominal:   No nausea, vomiting, diarrhea, bright red blood per rectum, melena, or hematemesis Neurologic:  No visual changes, wkns, changes in mental status. All other systems reviewed and are otherwise negative except as noted  above.       Physical Exam    VS:  BP 104/72   Pulse (!) 52   Ht 6\' 3"  (1.905 m)   Wt  211 lb (95.7 kg)   SpO2 98%   BMI 26.37 kg/m  , BMI Body mass index is 26.37 kg/m.     GEN: Well nourished, well developed, in no acute distress. HEENT: normal. Neck: Supple, no JVD, carotid bruits, or masses. Cardiac: RRR, bradycardic no murmurs, rubs, or gallops. No clubbing, cyanosis, edema.  Radials/DP/PT 2+ and equal bilaterally.  Respiratory:  Respirations regular and unlabored, clear to auscultation bilaterally. GI: Soft, nontender, nondistended, BS + x 4. MS: no deformity or atrophy. Skin: warm and dry, no rash. Neuro:  Strength and sensation are intact. Psych: Normal affect.      Lab Results  Component Value Date   WBC 5.9 02/21/2023   HGB 16.2 02/21/2023   HCT 47.5 02/21/2023   MCV 95.4 02/21/2023   PLT 190 02/21/2023   Lab Results  Component Value Date   CREATININE 1.23 02/21/2023   BUN 12 02/21/2023   NA 137 02/21/2023   K 3.7 02/21/2023   CL 104 02/21/2023   CO2 23 02/21/2023   Lab Results  Component Value Date   ALT 19 08/26/2021   AST 24 08/26/2021   ALKPHOS 67 08/26/2021   BILITOT 0.8 08/26/2021   Lab Results  Component Value Date   CHOL 140 02/22/2023   HDL 50 02/22/2023   LDLCALC 81 02/22/2023   TRIG 43 02/22/2023   CHOLHDL 2.8 02/22/2023    Lab Results  Component Value Date   HGBA1C 5.6 02/22/2023     Review of Prior Studies    Carotid Artery Ultrasound 02/22/2023 IMPRESSION: 1. 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 2. Antegrade bilateral vertebral arterial flow.   NM Stress Test 06/08/2021 The study is normal. The study is low risk.   No ST deviation was noted.   LV perfusion is normal. There is no evidence of ischemia. There is no evidence of infarction.   Left ventricular function is normal. End diastolic cavity size is normal. End systolic cavity size is normal.  Echocardiogram  06/08/2021  1. Left ventricular ejection fraction, by estimation, is 60 to 65%. The  left ventricle has normal function. The left ventricle has no regional  wall motion abnormalities. Left ventricular diastolic parameters were  normal.   2. Right ventricular systolic function is normal. The right ventricular  size is normal. There is normal pulmonary artery systolic pressure.   3. Left atrial size was mildly dilated.   4. The mitral valve is normal in structure. No evidence of mitral valve  regurgitation. No evidence of mitral stenosis.   5. The aortic valve is tricuspid. Aortic valve regurgitation is not  visualized. No aortic stenosis is present.   6. Aortic dilatation noted. There is mild dilatation of the ascending  aorta, measuring 40 mm.   7. The inferior vena cava is normal in size with greater than 50%  respiratory variability, suggesting right atrial pressure of 3 mmHg.     Assessment & Plan   1.  TIA symptoms: Uncertain etiology.  He felt the right side of his face and ear feel very large and swollen, heaviness and numbness and tingling on the right arm and down the right leg, with pain radiating across his chest.  He was ruled out for ACS concerning chest pain.  There was no signs of CVA on CT scan.  Carotid ultrasounds were negative for stenosis.  Will repeat echocardiogram this will be completed at [facility] at his request as he lives in  [place]..  On last echocardiogram in 2022 there was no PFO noted.  Will rereview for any changes in structural heart, function, or valvular abnormalities.  2.  Bradycardia: I reviewed his medications.  He is not on any AV nodal blocking agents with his medications which can contribute to bradycardia.  He states he has had bradycardia for a long time.  He used to run in the past but is not physically active concerning purposeful exercise now.  I will place a ZIO monitor to evaluate for pauses, or significant bradycardia.   3.   Hyperlipidemia: Currently on atorvastatin 20 mg daily.  Goal of LDL less than 100.  Labs are good to be drawn by PCP on annual follow-up this week.  4.  Chest pain: Associated with TIA.  May consider nuclear medicine stress test with cardiovascular risk factors of age, hyperlipidemia.  Will review other testing first and consider cardiac CTA first prior to stress test.           Signed, Bettey Mare. Liborio Nixon, ANP, AACC   02/25/2023 1:48 PM      Office [phone] Fax [phone]  Notice: This dictation was prepared with Dragon dictation along with smaller phrase technology. Any transcriptional errors that result from this process are unintentional and may not be corrected upon review.

## 2023-02-26 DIAGNOSIS — Z9229 Personal history of other drug therapy: Secondary | ICD-10-CM | POA: Diagnosis not present

## 2023-02-26 DIAGNOSIS — E538 Deficiency of other specified B group vitamins: Secondary | ICD-10-CM | POA: Diagnosis not present

## 2023-02-26 DIAGNOSIS — E559 Vitamin D deficiency, unspecified: Secondary | ICD-10-CM | POA: Diagnosis not present

## 2023-02-26 DIAGNOSIS — D518 Other vitamin B12 deficiency anemias: Secondary | ICD-10-CM | POA: Diagnosis not present

## 2023-02-26 DIAGNOSIS — Z6826 Body mass index (BMI) 26.0-26.9, adult: Secondary | ICD-10-CM | POA: Diagnosis not present

## 2023-02-26 DIAGNOSIS — E663 Overweight: Secondary | ICD-10-CM | POA: Diagnosis not present

## 2023-02-26 DIAGNOSIS — Z23 Encounter for immunization: Secondary | ICD-10-CM | POA: Diagnosis not present

## 2023-02-26 DIAGNOSIS — G9332 Myalgic encephalomyelitis/chronic fatigue syndrome: Secondary | ICD-10-CM | POA: Diagnosis not present

## 2023-02-26 DIAGNOSIS — Z0001 Encounter for general adult medical examination with abnormal findings: Secondary | ICD-10-CM | POA: Diagnosis not present

## 2023-02-26 DIAGNOSIS — F419 Anxiety disorder, unspecified: Secondary | ICD-10-CM | POA: Diagnosis not present

## 2023-02-26 DIAGNOSIS — E782 Mixed hyperlipidemia: Secondary | ICD-10-CM | POA: Diagnosis not present

## 2023-02-26 DIAGNOSIS — G459 Transient cerebral ischemic attack, unspecified: Secondary | ICD-10-CM | POA: Diagnosis not present

## 2023-02-26 DIAGNOSIS — Z1331 Encounter for screening for depression: Secondary | ICD-10-CM | POA: Diagnosis not present

## 2023-02-26 DIAGNOSIS — E063 Autoimmune thyroiditis: Secondary | ICD-10-CM | POA: Diagnosis not present

## 2023-02-26 DIAGNOSIS — Z125 Encounter for screening for malignant neoplasm of prostate: Secondary | ICD-10-CM | POA: Diagnosis not present

## 2023-02-26 DIAGNOSIS — A6922 Other neurologic disorders in Lyme disease: Secondary | ICD-10-CM | POA: Diagnosis not present

## 2023-02-27 DIAGNOSIS — I495 Sick sinus syndrome: Secondary | ICD-10-CM | POA: Diagnosis not present

## 2023-02-27 DIAGNOSIS — I639 Cerebral infarction, unspecified: Secondary | ICD-10-CM | POA: Diagnosis not present

## 2023-03-10 DIAGNOSIS — E039 Hypothyroidism, unspecified: Secondary | ICD-10-CM | POA: Diagnosis not present

## 2023-03-10 DIAGNOSIS — M25561 Pain in right knee: Secondary | ICD-10-CM | POA: Diagnosis not present

## 2023-03-10 DIAGNOSIS — F419 Anxiety disorder, unspecified: Secondary | ICD-10-CM | POA: Diagnosis not present

## 2023-03-10 DIAGNOSIS — M25551 Pain in right hip: Secondary | ICD-10-CM | POA: Diagnosis not present

## 2023-03-10 DIAGNOSIS — E063 Autoimmune thyroiditis: Secondary | ICD-10-CM | POA: Diagnosis not present

## 2023-03-17 DIAGNOSIS — I495 Sick sinus syndrome: Secondary | ICD-10-CM | POA: Diagnosis not present

## 2023-03-17 DIAGNOSIS — I639 Cerebral infarction, unspecified: Secondary | ICD-10-CM | POA: Diagnosis not present

## 2023-03-18 ENCOUNTER — Ambulatory Visit: Payer: Medicare Other | Admitting: Podiatry

## 2023-03-20 ENCOUNTER — Telehealth: Payer: Self-pay

## 2023-03-20 DIAGNOSIS — I495 Sick sinus syndrome: Secondary | ICD-10-CM

## 2023-03-20 NOTE — Telephone Encounter (Addendum)
Called patient regarding results . Patient had understanding of results. Patient advised referral to EP .----- Message from Joni Reining sent at 03/18/2023 11:18 AM EDT ----- Marthe Patch back from Dr. Eden Emms. He will needs referral to EP for abnormal Zio Monitor with frequent arrhythmias and bradycardia.

## 2023-03-20 NOTE — Telephone Encounter (Signed)
Called patient regarding results. Advised Patient referral sent to EP. Patient understood provider recommendations.

## 2023-03-25 ENCOUNTER — Ambulatory Visit (HOSPITAL_COMMUNITY): Admission: RE | Admit: 2023-03-25 | Payer: Medicare Other | Source: Ambulatory Visit

## 2023-04-01 ENCOUNTER — Ambulatory Visit (INDEPENDENT_AMBULATORY_CARE_PROVIDER_SITE_OTHER): Payer: Medicare Other

## 2023-04-01 ENCOUNTER — Ambulatory Visit (INDEPENDENT_AMBULATORY_CARE_PROVIDER_SITE_OTHER): Payer: Medicare Other | Admitting: Podiatry

## 2023-04-01 DIAGNOSIS — M722 Plantar fascial fibromatosis: Secondary | ICD-10-CM

## 2023-04-01 DIAGNOSIS — M2041 Other hammer toe(s) (acquired), right foot: Secondary | ICD-10-CM

## 2023-04-01 DIAGNOSIS — R52 Pain, unspecified: Secondary | ICD-10-CM | POA: Diagnosis not present

## 2023-04-01 DIAGNOSIS — M2042 Other hammer toe(s) (acquired), left foot: Secondary | ICD-10-CM

## 2023-04-01 DIAGNOSIS — M778 Other enthesopathies, not elsewhere classified: Secondary | ICD-10-CM

## 2023-04-01 NOTE — Patient Instructions (Signed)

## 2023-04-01 NOTE — Progress Notes (Signed)
Subjective: No chief complaint on file.  72 year old male presents the office today for concerns of pain in the arch of his foot right side much worse than left, which is now.  He has noticed a knot in the arch of the foot and ongoing for some time.  No injuries.  No treatment.  He has no other concerns today.  Objective: AAO x3, NAD DP/PT pulses palpable bilaterally, CRT less than 3 seconds On medial band of plantar fascia in the arch of the foot is firm soft tissue masses consistent with plantar fibroma.  He has tenderness palpation these areas.  There is no other areas of pinpoint tenderness.  Flexor, extensor tendons.  Intact.  MMT 5/5. Hyperkeratotic lesion noted along the medial aspect of the right fifth toe without any underlying ulceration drainage or signs of infection.  There was some dried blood in the area is preulcerative. No pain with calf compression, swelling, warmth, erythema  Assessment: Plantar fibromatosis right side; hyperkeratotic lesion/digital contracture  Plan: -All treatment options discussed with the patient including all alternatives, risks, complications.  -X-rays obtained reviewed.  3 views of the feet were obtained.  No evidence of acute fracture.  No calcifications. -Steroid injection performed to the plantar fibroma on the right side.  Feels good alcohol mixture 1 cc Kenalog 10, 0.5 cc of Marcaine plain, 0.5 cc lidocaine plain was infiltrated into the soft tissue mass without complications.  Postinjection care discussed.  Tolerated well any complications. -Ordered a compound cream through [facility] for scarring -Stretching, icing daily. -As a courtesy debrided the callus with any complications or bleeding.  Discussed offloading, toe spacers as needed. -Patient encouraged to call the office with any questions, concerns, change in symptoms.   Vivi Barrack DPM

## 2023-04-08 ENCOUNTER — Telehealth (INDEPENDENT_AMBULATORY_CARE_PROVIDER_SITE_OTHER): Payer: Self-pay

## 2023-04-08 NOTE — Telephone Encounter (Signed)
[employer] patient assistance approved through 06/09/2024.

## 2023-04-10 DIAGNOSIS — E039 Hypothyroidism, unspecified: Secondary | ICD-10-CM | POA: Diagnosis not present

## 2023-04-10 DIAGNOSIS — G609 Hereditary and idiopathic neuropathy, unspecified: Secondary | ICD-10-CM | POA: Diagnosis not present

## 2023-04-14 DIAGNOSIS — G609 Hereditary and idiopathic neuropathy, unspecified: Secondary | ICD-10-CM | POA: Diagnosis not present

## 2023-04-14 DIAGNOSIS — E039 Hypothyroidism, unspecified: Secondary | ICD-10-CM | POA: Diagnosis not present

## 2023-04-16 DIAGNOSIS — M7061 Trochanteric bursitis, right hip: Secondary | ICD-10-CM | POA: Diagnosis not present

## 2023-04-16 DIAGNOSIS — Z96651 Presence of right artificial knee joint: Secondary | ICD-10-CM | POA: Diagnosis not present

## 2023-04-27 NOTE — Progress Notes (Deleted)
  Cardiology Office Note:  .   Date:  04/27/2023  ID:  Frank Weber, DOB [birth year], MRN [id number] PCP: Elfredia Nevins, MD  [facility] Providers Cardiologist: Charlton Haws, MD {  }   History of Present Illness: .   Frank Weber is a 72 y.o. male with h/o  GERD, heart murmur, hyperlipidemia, hypothyroidism, peripheral neuropathy, migraines. Admitted 02/21/2023--02/22/2023 Admitted with TIA, chest pain left sided arm pain. He had dysphasia. CT scan of the head acute or subacute infarct versus artifact in the posterior right frontal lobe MRI of the head revealed revealing no signs of acute stroke, He was ruled out for ACS. Carotid studies found to have <50% diameter ICA stenosis.  Echo was completed in December 2022 without any evidence of PFO with normal LVEF of 60 to 65%.    When seen last by me, on 02/25/2023 echocardiogram was ordered to evaluate for changes in LV function or valvular abnormalities.  ZIO monitor was placed to evaluate for pauses or significant bradycardia.  Consideration for cardiac CTA if he remains symptomatic.  Echocardiogram had not been completed.  ZIO monitor revealed frequent rapid heart rate frequent ventricular arrhythmias, significant PSVT versus atrial tachycardia.  He was referred to electrophysiologist after discussing with Dr. Eden Emms.   ROS: ***  Studies Reviewed: .        *** EKG Interpretation Date/Time:    Ventricular Rate:    PR Interval:    QRS Duration:    QT Interval:    QTC Calculation:   R Axis:      Text Interpretation:      Physical Exam:   VS:  There were no vitals taken for this visit.   Wt Readings from Last 3 Encounters:  02/25/23 211 lb (95.7 kg)  02/22/23 209 lb 14.1 oz (95.2 kg)  01/01/23 210 lb (95.3 kg)    GEN: Well nourished, well developed in no acute distress NECK: No JVD; No carotid bruits CARDIAC: ***RRR, no murmurs, rubs, gallops RESPIRATORY:  Clear to auscultation without rales, wheezing or rhonchi   ABDOMEN: Soft, non-tender, non-distended EXTREMITIES:  No edema; No deformity   ASSESSMENT AND PLAN: .   ***    {Are you ordering a CV Procedure (e.g. stress test, cath, DCCV, TEE, etc)?   Press F2        :[id number]}    Signed, Bettey Mare. Liborio Nixon, ANP, AACC

## 2023-04-28 ENCOUNTER — Ambulatory Visit: Payer: Medicare Other | Admitting: Adult Health

## 2023-04-30 ENCOUNTER — Ambulatory Visit (HOSPITAL_COMMUNITY)
Admission: RE | Admit: 2023-04-30 | Discharge: 2023-04-30 | Disposition: A | Discharge status: Home / Self Care | Payer: Medicare Other | Source: Ambulatory Visit | Attending: Adult Health | Admitting: Adult Health

## 2023-04-30 DIAGNOSIS — R001 Bradycardia, unspecified: Secondary | ICD-10-CM | POA: Diagnosis not present

## 2023-04-30 DIAGNOSIS — I6389 Other cerebral infarction: Secondary | ICD-10-CM

## 2023-04-30 DIAGNOSIS — I495 Sick sinus syndrome: Secondary | ICD-10-CM

## 2023-04-30 DIAGNOSIS — I639 Cerebral infarction, unspecified: Secondary | ICD-10-CM | POA: Diagnosis not present

## 2023-04-30 LAB — ECHOCARDIOGRAM COMPLETE
Area-P 1/2: 3.48 cm2
S' Lateral: 2.6 cm

## 2023-04-30 NOTE — Progress Notes (Signed)
*  PRELIMINARY RESULTS* Echocardiogram 2D Echocardiogram has been performed.  [name] 04/30/2023, 9:20 AM

## 2023-05-01 ENCOUNTER — Telehealth: Payer: Self-pay

## 2023-05-01 NOTE — Telephone Encounter (Addendum)
Results viewed by patient via Mychart.----- Message from Joni Reining sent at 05/01/2023  7:33 AM EST ----- I have reviewed the echo. It is unchanged from the previous echo in 2022.  Normal heart pumping function, no evidence of heart valve disease. Good report.

## 2023-05-04 NOTE — Progress Notes (Unsigned)
  Electrophysiology Office Note:    Date:  05/05/2023   ID:  Frank Weber, DOB 28-Feb-1951, MRN [id number]  PCP:  Elfredia Nevins, MD   [facility] Providers Cardiologist:  Charlton Haws, MD     Referring MD: Jodelle Gross, NP   History of Present Illness:    Frank Weber is a 72 y.o. male with a medical history significant for hyperlipidemia, hypothyroidism referred for management of tachy-brady.     I discussed the use of AI scribe software for clinical note transcription with the patient, who gave verbal consent to proceed.   The patient notes that their heart rate has always been in the 40s to 50s, and even during a pharmacologic stress test two years ago, their heart rate only increased to 67.  He has not had any fatigue, syncope, presyncope.    He was admitted with transient neurological symptoms concerning for stroke/TIA. MRI was normal.  A ZIO monitor was placed that showed a normal heart rate distribution in sinus rhythm but occasional episodes of atrial tachycardia, the longest of which was about 18 seconds. The patient does not report any symptoms associated with these episodes of fast heart rate, such as palpitations or lightheadedness.           Today, he reports that he is unwell  EKGs/Labs/Other Studies Reviewed Today:     Echocardiogram:  Transthoracic April 30, 2023 EF 60 to 65%.  Moderate right atrial dilation.   Monitors:  7-day ZIO monitor  -- my interpretation Sinus rhythm 38 to 133 bpm, average 58 There were 43 episodes labeled SVT --these are most consistent with atrial runs.  The longest was 18 seconds. Overall ectopy burden was less than 1%    EKG:         Physical Exam:    VS:  BP 104/74 (BP Location: Left Arm, Patient Position: Sitting, Cuff Size: Large)   Pulse (!) 48   Ht 6\' 3"  (1.905 m)   Wt 210 lb 9.6 oz (95.5 kg)   SpO2 98%   BMI 26.32 kg/m     Wt Readings from Last 3 Encounters:  05/05/23 210 lb 9.6  oz (95.5 kg)  02/25/23 211 lb (95.7 kg)  02/22/23 209 lb 14.1 oz (95.2 kg)     GEN:  Well nourished, well developed in no acute distress CARDIAC: RRR, no murmurs, rubs, gallops RESPIRATORY:  Normal work of breathing MUSCULOSKELETAL: no edema    ASSESSMENT & PLAN:     TIA Monitor showed paroxysms of atrial tachycardia but no atrial fibrillation I think ongoing monitoring is warranted to exclude atrial fibrillation given that he has potential triggers with paroxysmal atrial tachycardia I explained the indication and rationale for the procedure and risks including minor bleeding and infection.  I explained the monitoring process with the associated fee.  He would like to proceed.  We will schedule this today  Paroxysmal atrial tachycardia Asymptomatic Longest monitored episode was less than 20 seconds No indication for intervention presently Medical therapy including beta-blocker use is limited due to baseline bradycardia      Signed, Maurice Small, MD  05/05/2023 9:54 AM    [facility]

## 2023-05-05 ENCOUNTER — Encounter: Payer: Self-pay | Admitting: Cardiovascular Disease

## 2023-05-05 ENCOUNTER — Ambulatory Visit: Payer: Medicare Other | Attending: Cardiovascular Disease | Admitting: Cardiovascular Disease

## 2023-05-05 VITALS — BP 104/74 | HR 48 | Ht 75.0 in | Wt 210.6 lb

## 2023-05-05 DIAGNOSIS — I495 Sick sinus syndrome: Secondary | ICD-10-CM

## 2023-05-05 DIAGNOSIS — R011 Cardiac murmur, unspecified: Secondary | ICD-10-CM | POA: Diagnosis not present

## 2023-05-05 NOTE — Patient Instructions (Signed)
Medication Instructions:  Your physician recommends that you continue on your current medications as directed. Please refer to the Current Medication list given to you today. *If you need a refill on your cardiac medications before your next appointment, please call your pharmacy*   Testing/Procedures: Implantable Loop Recorder   Follow-Up: At [facility], you and your health needs are our priority.  As part of our continuing mission to provide you with exceptional heart care, we have created designated Provider Care Teams.  These Care Teams include your primary Cardiologist (physician) and Advanced Practice Providers (APPs -  Physician Assistants and Nurse Practitioners) who all work together to provide you with the care you need, when you need it.  We recommend signing up for the patient portal called "MyChart".  Sign up information is provided on this After Visit Summary.  MyChart is used to connect with patients for Virtual Visits (Telemedicine).  Patients are able to view lab/test results, encounter notes, upcoming appointments, etc.  Non-urgent messages can be sent to your provider as well.   To learn more about what you can do with MyChart, go to [email or web address].    Your next appointment:   We will contact you to set up an appointment for loop implant  Provider:   York Pellant, MD

## 2023-05-10 DIAGNOSIS — G609 Hereditary and idiopathic neuropathy, unspecified: Secondary | ICD-10-CM | POA: Diagnosis not present

## 2023-05-10 DIAGNOSIS — E039 Hypothyroidism, unspecified: Secondary | ICD-10-CM | POA: Diagnosis not present

## 2023-05-13 NOTE — Progress Notes (Unsigned)
Electrophysiology Office Note:    Date:  05/14/2023   ID:  Frank Weber, DOB [birth year], MRN [id number]  PCP:  Elfredia Nevins, MD   [facility] Providers Cardiologist:  Charlton Haws, MD Electrophysiologist:  Maurice Small, MD     Referring MD: Elfredia Nevins, MD   History of Present Illness:    Frank Weber is a 72 y.o. male with a medical history significant for hyperlipidemia, hypothyroidism referred for management of tachy-brady.     I discussed the use of AI scribe software for clinical note transcription with the patient, who gave verbal consent to proceed.   The patient notes that their heart rate has always been in the 40s to 50s, and even during a pharmacologic stress test two years ago, their heart rate only increased to 67.  He has not had any fatigue, syncope, presyncope.    He was admitted with transient neurological symptoms concerning for stroke/TIA. MRI was normal.  A ZIO monitor was placed that showed a normal heart rate distribution in sinus rhythm but occasional episodes of atrial tachycardia, the longest of which was about 18 seconds. The patient does not report any symptoms associated with these episodes of fast heart rate, such as palpitations or lightheadedness.        Today, he reports that he is well and has no complaints.  EKGs/Labs/Other Studies Reviewed Today:     Echocardiogram:  Transthoracic April 30, 2023 EF 60 to 65%.  Moderate right atrial dilation.   Monitors:  7-day ZIO monitor  -- my interpretation Sinus rhythm 38 to 133 bpm, average 58 There were 43 episodes labeled SVT --these are most consistent with atrial runs.  The longest was 18 seconds. Overall ectopy burden was less than 1%    EKG:   EKG Interpretation Date/Time:  Wednesday May 14 2023 08:27:44 EST Ventricular Rate:  46 PR Interval:  172 QRS Duration:  92 QT Interval:  446 QTC Calculation: 390 R Axis:   16  Text Interpretation: Sinus  bradycardia Cannot rule out Anterior infarct , age undetermined When compared with ECG of 05-May-2023 09:03, Questionable change in QRS axis Confirmed by York Pellant [phone]) on 05/14/2023 8:36:03 AM     Physical Exam:    VS:  BP 112/68   Pulse (!) 46   Ht 6\' 3"  (1.905 m)   Wt 216 lb 6.4 oz (98.2 kg)   SpO2 98%   BMI 27.05 kg/m     Wt Readings from Last 3 Encounters:  05/14/23 216 lb 6.4 oz (98.2 kg)  05/05/23 210 lb 9.6 oz (95.5 kg)  02/25/23 211 lb (95.7 kg)     GEN:  Well nourished, well developed in no acute distress CARDIAC: RRR, no murmurs, rubs, gallops RESPIRATORY:  Normal work of breathing MUSCULOSKELETAL: no edema    ASSESSMENT & PLAN:     TIA Monitor showed paroxysms of atrial tachycardia but no atrial fibrillation I think ongoing monitoring is warranted to exclude atrial fibrillation given that he has potential triggers with paroxysmal atrial tachycardia I explained the indication and rationale for the procedure and risks including minor bleeding and infection.  I explained the monitoring process with the associated fee.  He would like to proceed.  We will proceed with implant today.  Paroxysmal atrial tachycardia Asymptomatic Longest monitored episode was less than 20 seconds No indication for intervention presently Medical therapy including beta-blocker use is limited due to baseline bradycardia  SURGEON:  Maurice Small, MD  PREPROCEDURE DIAGNOSIS:  TIA, atrial tachycardia    POSTPROCEDURE DIAGNOSIS:  TIA, atrial tachycardia     PROCEDURES:   1. Implantable loop recorder implantation    INTRODUCTION:   Frank Weber  is a 72 y.o. patient with a history of cryptogenic stroke. Inpatient telemetry has been reviewed and not shown atrial fibrillation. The patient therefore presents today for implantable loop implantation. The costs of loop recorder monitoring have been discussed with the patient.    DESCRIPTION OF PROCEDURE:  Informed written  consent was obtained.  A timeout was performed. The patient required no sedation for the procedure today.  The patients left chest was prepped and draped in the usual sterile fashion. The skin overlying the left parasternal region was infiltrated with lidocaine for local analgesia.  A 0.5-cm incision was made over the left parasternal region over the 3rd intercostal space.  A Medtronic LINQ II implantable loop recorder ([id number]) was then placed into the pocket  R waves were very prominent and measured >0.54mV.  Steri- Strips and a sterile dressing were then applied.  There were no early apparent complications.     CONCLUSIONS:   1. Successful implantation of a Medtronic Linq II implantable loop recorder for a history of cryptogenic stroke  2. No early apparent complications.    Signed, Maurice Small, MD  05/14/2023 8:51 AM    [facility]

## 2023-05-14 ENCOUNTER — Encounter: Payer: Self-pay | Admitting: Cardiovascular Disease

## 2023-05-14 ENCOUNTER — Ambulatory Visit: Payer: Medicare Other | Attending: Cardiovascular Disease | Admitting: Cardiovascular Disease

## 2023-05-14 VITALS — BP 112/68 | HR 46 | Ht 75.0 in | Wt 216.4 lb

## 2023-05-14 DIAGNOSIS — I495 Sick sinus syndrome: Secondary | ICD-10-CM | POA: Insufficient documentation

## 2023-05-14 DIAGNOSIS — R000 Tachycardia, unspecified: Secondary | ICD-10-CM | POA: Diagnosis not present

## 2023-05-14 NOTE — Patient Instructions (Signed)
Medication Instructions:  Your physician recommends that you continue on your current medications as directed. Please refer to the Current Medication list given to you today.  Labwork: None ordered.  Testing/Procedures: None ordered.  Follow-Up:  Your physician wants you to follow-up in: one year with Dr. Augustus Mealor.  You will receive a reminder letter in the mail two months in advance. If you don't receive a letter, please call our office to schedule the follow-up appointment.  Implantable Loop Recorder Placement, Care After This sheet gives you information about how to care for yourself after your procedure. Your health care provider may also give you more specific instructions. If you have problems or questions, contact your health care provider.  What can I expect after the procedure? After the procedure, it is common to have: Soreness or discomfort near the incision. Some swelling or bruising near the incision.  Follow these instructions at home: Incision care  Monitor your cardiac device site for redness, swelling, and drainage. Call the device clinic at [phone] if you experience these symptoms or fever/chills.  Keep the large square bandage on your site for 24 hours and then you may remove it yourself. Keep the steri-strips underneath in place.   You may shower after 72 hours / 3 days from your procedure with the steri-strips in place. They will usually fall off on their own, or may be removed after 10 days. Pat dry.   Avoid lotions, ointments, or perfumes over your incision until it is well-healed.  Please do not submerge in water until your site is completely healed.   Your device is MRI compatible.   Remote monitoring is used to monitor your cardiac device from home. This monitoring is scheduled every month by our office. It allows us to keep an eye on the function of your device to ensure it is working properly.  If your wound site starts to bleed apply  pressure.    For help with the monitor please call Medtronic Monitor Support Specialist directly at [phone].    If you have any questions/concerns please call the device clinic at [phone].  Activity  Return to your normal activities.  General instructions Follow instructions from your health care provider about how to manage your implantable loop recorder and transmit the information. Learn how to activate a recording if this is necessary for your type of device. You may go through a metal detection gate, and you may let someone hold a metal detector over your chest. Show your ID card if needed. Do not have an MRI unless you check with your health care provider first. Take over-the-counter and prescription medicines only as told by your health care provider. Keep all follow-up visits as told by your health care provider. This is important. Contact a health care provider if: You have redness, swelling, or pain around your incision. You have a fever. You have pain that is not relieved by your pain medicine. You have triggered your device because of fainting (syncope) or because of a heartbeat that feels like it is racing, slow, fluttering, or skipping (palpitations). Get help right away if you have: Chest pain. Difficulty breathing. Summary After the procedure, it is common to have soreness or discomfort near the incision. Change your dressing as told by your health care provider. Follow instructions from your health care provider about how to manage your implantable loop recorder and transmit the information. Keep all follow-up visits as told by your health care provider. This is important. This information   is not intended to replace advice given to you by your health care provider. Make sure you discuss any questions you have with your health care provider. Document Released: 05/08/2015 Document Revised: 07/12/2017 Document Reviewed: 07/12/2017 Elsevier Patient Education   2020 Elsevier Inc.  

## 2023-05-17 ENCOUNTER — Encounter (INDEPENDENT_AMBULATORY_CARE_PROVIDER_SITE_OTHER): Payer: Self-pay

## 2023-05-19 ENCOUNTER — Other Ambulatory Visit (INDEPENDENT_AMBULATORY_CARE_PROVIDER_SITE_OTHER): Payer: Self-pay | Admitting: Gastroenterology

## 2023-05-19 DIAGNOSIS — R110 Nausea: Secondary | ICD-10-CM

## 2023-05-19 NOTE — Telephone Encounter (Signed)
 Last seen 11/25/22

## 2023-05-27 ENCOUNTER — Ambulatory Visit (INDEPENDENT_AMBULATORY_CARE_PROVIDER_SITE_OTHER): Payer: Medicare Other | Admitting: Gastroenterology

## 2023-05-27 VITALS — BP 112/75 | HR 48 | Temp 97.6°F | Ht 75.0 in | Wt 218.6 lb

## 2023-05-27 DIAGNOSIS — K580 Irritable bowel syndrome with diarrhea: Secondary | ICD-10-CM | POA: Diagnosis not present

## 2023-05-27 DIAGNOSIS — R110 Nausea: Secondary | ICD-10-CM

## 2023-05-27 DIAGNOSIS — K649 Unspecified hemorrhoids: Secondary | ICD-10-CM | POA: Diagnosis not present

## 2023-05-27 DIAGNOSIS — K582 Mixed irritable bowel syndrome: Secondary | ICD-10-CM

## 2023-05-27 DIAGNOSIS — R152 Fecal urgency: Secondary | ICD-10-CM

## 2023-05-27 NOTE — Progress Notes (Addendum)
Referring Provider: Elfredia Nevins, MD Primary Care Physician:  Elfredia Nevins, MD Primary GI Physician: Dr. Levon Hedger   Chief Complaint  Patient presents with   Constipation    Follow up on constipation. Takes linzess 145 mcg daily. States some days has constipation and some days diarrhea when taking med.    Nausea    Has a lot of nausea. Takes zofran 3 -4 times per weeks.    Hemorrhoids    Having some discomfort and swelling with hemorrhoids and some bleeding. Using prep cream and wipes. Soaking in tub.    HPI:   Frank Weber is a 72 y.o. male with past medical history of  GERD, hypothyroidism, HLD, peripheral neuropathy, and IBS   Patient presenting today for IBS, nausea and hemorrhoids  Last seen June 2024, at that time stools ranging from diarrhea to constipation with some fecal urgency.  Some abdominal pain at times, usually in left lower quadrant pain but improved with defecation.  Doing well on Remeron which he feels helps.  Took Benefiber in the past without much improvement.  Recommended to schedule colonoscopy, continue Linzess 145 mcg daily, continue Remeron 50 mg at bedtime, good water intake, diet high in fruits, veggies, whole grains.  Present: Taking linzess daily. States BMs are sporadic. Only BM this week was yesterday. Stool was fairly normal. Sometimes can have diarrhea, sometimes will have harder stools. May have 2 BMs per week, sometimes can have 5-6 stools per day. Has occasional abdominal pain, more nausea than pain though. Abdominal pain improves with defecation. He has some hemorrhoids that he can feel sometimes if he is straining more. Using preparation H and sitz baths.sometimes can see blood when wiping or in the toilet from this. Some improvement with sitz bath and preparation H. He drinks a lot of coffee, not a lot of water. He also drinks a lot of sweet tea. He has tried benefiber which made him have more diarrhea, miralax also caused more  diarrhea. He is taking linzess pretty regularly, will skip a dose if he is going to be traveling.   Feels that nausea has been worse recently though he has had a bad cold and feels that has flared it up, however over the last few months. Notes nausea is worse with worsening constipation or diarrhea. Appetite is good. No melena. Weight is stable. Rare GERD symptoms. No dysphagia, rare NSAID use.   GES: 04/2022 normal  MR brain wo contrast 02/2023 negative CT chest abdomen pelvis with contrast 02/27/2022: -No acute findings within the chest, abdomen, or pelvis -Small nonspecific pulmonary nodules present, no need for follow-up imaging if low risk -Moderate stool burden within the colon -Sigmoid diverticulosis without diverticulitis Last EGD 01/2021 - Salmon-colored mucosa suspicious for short-segment Barrett's esophagus. Biopsied - reflux changes. - Erythematous mucosa in the antrum. Biopsied - reactive gastropathy neg for HP. - Normal examined duodenum.  Last Colonoscopy:12/2022  - One 4 mm polyp in the transverse colon, removed                            with a cold snare. Resected and retrieved.                           - Four 2 to 6 mm polyps in the sigmoid colon and in  the descending colon, removed with a cold snare.                            Resected and retrieved.                           - Diverticulosis in the sigmoid colon and in the                            descending colon.                           - Non-bleeding internal hemorrhoids. Pathology with hyperplastic polyp in descending and sigmoid colon, negative for dysplasia or carcinoma, tubular adenoma in transverse, negative for high-grade dysplasia or carcinoma  Repeat TCS July 2029   Past Medical History:  Diagnosis Date   Arthritis    right knee   Cancer (HCC)    Skin   Constipation    Frequency of urination    GERD (gastroesophageal reflux disease)    Heart murmur    "slight"     History of kidney stones    History of melanoma excision    2012--  NECK/ HEAD   History of squamous cell carcinoma excision    RIGHT EAR   Hyperlipidemia    Hypothyroidism    IBS (irritable bowel syndrome)    states slight   Lumbar stenosis    L5 - S1   Migraines    Peripheral neuropathy    legs and feet   Renal insufficiency    Right flank pain    Right ureteral stone    Urgency of urination    Wears glasses     Past Surgical History:  Procedure Laterality Date   BIOPSY  01/10/2021   Procedure: BIOPSY;  Surgeon: Marguerita Merles, Reuel Boom, MD;  Location: [facility];  Service: Gastroenterology;;   CARDIAC CATHETERIZATION  07-08-2007  dr Jenne Campus    no significant CAD, preserved LVF, ef 50%//  30% pLAD   CARDIOVASCULAR STRESS TEST  06-20-2011  dr croitoru   Low risk study/  normal perfusion scan showing attenuation artifact in the anterior region of myocardium with no ischemia , infarct or scar/  normal LV funciton and wall motion , ef 62%   COLONOSCOPY N/A 02/17/2013   Rehman:examination performed to cecum. mild sigmoid colon diverticulosis, 4 small polyps ablated via cold biopsy from rectosigmoid junction (hyperplastic). External hemorrhoids.   COLONOSCOPY WITH PROPOFOL N/A 01/10/2021   Procedure: COLONOSCOPY WITH PROPOFOL;  Surgeon: Dolores Frame, MD;  Location: [facility];  Service: Gastroenterology;  Laterality: N/A;   COLONOSCOPY WITH PROPOFOL N/A 01/01/2023   Procedure: COLONOSCOPY WITH PROPOFOL;  Surgeon: Dolores Frame, MD;  [facility]: [facility];  Service: Gastroenterology;  Laterality: N/A;  10:15am;asa 2   CYSTOSCOPY W/ URETERAL STENT PLACEMENT Right 06/18/2015   Procedure: CYSTOSCOPY WITH RIGHT RETROGRADE PYELOGRAM RIGHT URETERAL STENT PLACEMENT;  Surgeon: Bjorn Pippin, MD;  Location: [facility];  Service: Urology;  Laterality: Right;   CYSTOSCOPY/URETEROSCOPY/HOLMIUM LASER/STENT PLACEMENT Right 06/27/2015   Procedure: CYSTOSCOPY RIGHT  URETEROSCOPY  STENT REMOVAL; STONE EXTRACTION WITH BASKET;  Surgeon: Bjorn Pippin, MD;  [facility];  Service: Urology;  Laterality: Right;   ESOPHAGOGASTRODUODENOSCOPY (EGD) WITH PROPOFOL N/A 01/10/2021   Procedure: ESOPHAGOGASTRODUODENOSCOPY (EGD) WITH PROPOFOL;  Surgeon: Marguerita Merles,  Reuel Boom, MD;  Location: [facility];  Service: Gastroenterology;  Laterality: N/A;  11:00, moved up per Soledad Gerlach - pt knows arrival time   HEAD & NECK SKIN LESION EXCISIONAL BIOPSY     HOLMIUM LASER APPLICATION Right 06/27/2015   Procedure: HOLMIUM LASER LITHOTRIPSY ;  Surgeon: Bjorn Pippin, MD;  Location[facility];  Service: Urology;  Laterality: Right;   I & D EXTREMITY Right 07/14/2018   Procedure: IRRIGATION AND DEBRIDEMENT RIGHT KNEE WITH CLOSURE;  Surgeon: Yolonda Kida, MD;  Location: [facility];  Service: Orthopedics;  Laterality: Right;   INGUINAL HERNIA REPAIR Bilateral 10/21/2007   KNEE ARTHROSCOPY W/ ACL RECONSTRUCTION Right 1992   MENISCUS REPAIR Right    2019, dr Thomasena Edis    POLYPECTOMY  01/10/2021   Procedure: POLYPECTOMY;  Surgeon: Dolores Frame, MD;  [facility];  Service: Gastroenterology;;   POLYPECTOMY  01/01/2023   Procedure: POLYPECTOMY INTESTINAL;  Surgeon: Dolores Frame, MD;  [facility];  Service: Gastroenterology;;   SHOULDER ARTHROSCOPY WITH OPEN ROTATOR CUFF REPAIR Left 07/27/2014   Procedure: LEFT SHOULDER ARTHROSCOPY WITH MINI OPEN ROTATOR CUFF REPAIR;  Surgeon: Nestor Lewandowsky, MD;  Location: [facility];  Service: Orthopedics;  Laterality: Left;   SHOULDER ARTHROSCOPY WITH OPEN ROTATOR CUFF REPAIR Right 2006   TONSILLECTOMY AND ADENOIDECTOMY  age 58   TOTAL KNEE ARTHROPLASTY Right 07/10/2018   Procedure: TOTAL KNEE ARTHROPLASTY;  Surgeon: Eugenia Mcalpine, MD;  Location: [facility];  Service: Orthopedics;  Laterality: Right;   TRANSTHORACIC ECHOCARDIOGRAM  07/30/2007   normal  LVF, ef >55%/  mild AR, MR , PR and TR/  mild AV sclerosis without stenosis/    URETEROSCOPY Right 06/18/2015   Procedure: URETEROSCOPY;  Surgeon: Bjorn Pippin, MD;  Location: [facility];  Service: Urology;  Laterality: Right;    Current Outpatient Medications  Medication Sig Dispense Refill   ALPRAZOLAM XR 1 MG 24 hr tablet Take 1 mg by mouth at bedtime.     aspirin EC 81 MG tablet Take 1 tablet (81 mg total) by mouth daily. Swallow whole. 90 tablet 3   atorvastatin (LIPITOR) 20 MG tablet Take 20 mg by mouth daily.     Cholecalciferol (DIALYVITE VITAMIN D 5000) 125 MCG (5000 UT) capsule Take 5,000 Units by mouth daily.     gabapentin (NEURONTIN) 300 MG capsule Take 900 mg by mouth 4 (four) times daily.     levothyroxine (SYNTHROID) 50 MCG tablet Take 50 mcg by mouth daily.     linaclotide (LINZESS) 145 MCG CAPS capsule Take 145 mcg by mouth daily before breakfast.     LORazepam (ATIVAN) 0.5 MG tablet Take 0.5 mg by mouth 3 (three) times daily as needed for anxiety.     mirtazapine (REMERON) 15 MG tablet TAKE 1 TABLET BY MOUTH DAILY AT BEDTIME 90 tablet 1   nitroGLYCERIN (NITROSTAT) 0.4 MG SL tablet Place 1 tablet (0.4 mg total) under the tongue every 5 (five) minutes as needed. 25 tablet 3   ondansetron (ZOFRAN) 4 MG tablet TAKE (1) TABLET BY MOUTH EVERY EIGHT HOURS AS NEEDED FOR NAUSEA 20 tablet 1   Polyethyl Glycol-Propyl Glycol (SYSTANE ULTRA) 0.4-0.3 % SOLN Place 2 drops into both eyes as needed (for dry eyes).      Probiotic Product (TRUBIOTICS PO) Take 1 capsule by mouth at bedtime.     rizatriptan (MAXALT-MLT) 10 MG disintegrating tablet Take 10 mg by mouth as needed (for migraines and may repeat once  in 2 hours, if no relief).      triamcinolone cream (KENALOG) 0.1 % Apply 1 Application topically 2 (two) times daily.     No current facility-administered medications for this visit.    Allergies as of 05/27/2023 - Review Complete 05/27/2023  Allergen Reaction Noted   Sulfa antibiotics  Anaphylaxis, Shortness Of Breath, and Rash 07/20/2014   Coconut (cocos nucifera) Rash 07/20/2014    Family History  Problem Relation Age of Onset   Colon cancer Other        Age of Onset-late 52's    Social History   Socioeconomic History   Marital status: Married    Spouse name: Not on file   Number of children: Not on file   Years of education: Not on file   Highest education level: Not on file  Occupational History   Not on file  Tobacco Use   Smoking status: Some Days    Types: Cigars    Passive exposure: Current   Smokeless tobacco: Never   Tobacco comments:    Some days smokes cigars but not frequent.   Vaping Use   Vaping status: Never Used  Substance and Sexual Activity   Alcohol use: Yes    Comment: seldom    Drug use: No   Sexual activity: Not on file  Other Topics Concern   Not on file  Social History Narrative   Not on file   Social Drivers of Health   Financial Resource Strain: Not on file  Food Insecurity: Not on file  Transportation Needs: Not on file  Physical Activity: Not on file  Stress: Not on file  Social Connections: Not on file    Review of systems General: negative for malaise, night sweats, fever, chills, weight loss Neck: Negative for lumps, goiter, pain and significant neck swelling Resp: Negative for cough, wheezing, dyspnea at rest CV: Negative for chest pain, leg swelling, palpitations, orthopnea GI: denies melena, hematochezia, vomiting, dysphagia, odyonophagia, early satiety or unintentional weight loss. +diarrhea/constipation +fecal urgency +nausea +hemorrhoids  MSK: Negative for joint pain or swelling, back pain, and muscle pain. Derm: Negative for itching or rash Psych: Denies depression, anxiety, memory loss, confusion. No homicidal or suicidal ideation.  Heme: Negative for prolonged bleeding, bruising easily, and swollen nodes. Endocrine: Negative for cold or heat intolerance, polyuria, polydipsia and goiter. Neuro:  negative for tremor, gait imbalance, syncope and seizures. The remainder of the review of systems is noncontributory.  Physical Exam: BP 112/75 (BP Location: Left Arm, Patient Position: Sitting, Cuff Size: Normal)   Pulse (!) 48   Temp 97.6 F (36.4 C) (Oral)   Ht 6\' 3"  (1.905 m)   Wt 218 lb 9.6 oz (99.2 kg)   BMI 27.32 kg/m  General:   Alert and oriented. No distress noted. Pleasant and cooperative.  Head:  Normocephalic and atraumatic. Eyes:  Conjuctiva clear without scleral icterus. Mouth:  Oral mucosa pink and moist. Good dentition. No lesions. Heart: Normal rate and rhythm, s1 and s2 heart sounds present.  Lungs: Clear lung sounds in all lobes. Respirations equal and unlabored. Abdomen:  +BS, soft, non-tender and non-distended. No rebound or guarding. No HSM or masses noted. Derm: No palmar erythema or jaundice Msk:  Symmetrical without gross deformities. Normal posture. Extremities:  Without edema. Neurologic:  Alert and  oriented x4 Psych:  Alert and cooperative. Normal mood and affect.  Invalid input(s): "6 MONTHS"   ASSESSMENT: Frank Weber is a 72 y.o. male presenting today for follow  up of IBS, nausea and hemorrhoids   IBS: fairly well managed with Linzess 145 mcg daily and Remeron 15 mg at bedtime.  Abdominal pain seems to be much improved since being on Remeron.  He does note continued range of stools from diarrhea to harder stools with some fecal urgency.  I did again discuss adding some Benefiber along with his Linzess to see if this helps bulk up stool some versus changing therapy altogether to something such as Trulance or Amitiza, however at this time he is fairly happy with results from Linzess and would like to continue with this.  Having some issues with hemorrhoids flaring up but feels that this is improved with Preparation H and sitz bath.  He can continue with current regimen, recommended patient to avoid straining and limit long periods of time on the  toilet.  He should let me know if hemorrhoids worsen.  Nausea: Previously with some weight loss though he is actually gained some weight back.  Has had thorough workup of his nausea as above without findings to explain symptoms.  Has been maintained on Zofran as needed for nausea with good results.  Remeron seems to have helped some and decreasing his nausea and has likely contributed to his weight gain as well.  Feels that nausea may be slightly worse recently though does note he has had a cold and thinks this is flaring things up.  Denies any melena, early satiety, upper abdominal pain, vomiting.  Last EGD in August 2022, will keep an eye on nausea though if he feels this is worsening or he has new associated symptoms he should let me know as we may need to repeat upper endoscopy for further evaluation.   PLAN:  Continue Linzess  2. Add benefiber 1T daily  3. Continue remeron 15mg  at bedtime 4. Continue zofran 4mg  PRN  5. Sitz baths/preparation H 6. Avoid straining, limit toilet time  7. Consider repeat EGD if nausea is worsening, or new associated symptoms 8. Increase water intake, aim for atleast 64 oz per day Increase fruits, veggies and whole grains, kiwi and prunes are especially good for constipation  All questions were answered, patient verbalized understanding and is in agreement with plan as outlined above.    Follow Up: 4 months   Kristopher Delk L. Jeanmarie Hubert, MSN, APRN, AGNP-C Adult-Gerontology Nurse Practitioner [facility]  I have reviewed the note and agree with the APP's assessment as described in this progress note  Katrinka Blazing, MD Gastroenterology and Hepatology [facility]

## 2023-05-27 NOTE — Patient Instructions (Signed)
Continue Linzess  Add benefiber 1T daily with a meal, can increase to 1T twice daily after 1 week if tolerating Make sure to increase water intake Continue remeron 15mg  at bedtime Continue zofran 4mg  as needed for nausea, if nausea is worsening, please let me know You can continue to use preparation H and doing sitz baths for hemorrhoids  Follow up 4 months

## 2023-05-28 DIAGNOSIS — M7061 Trochanteric bursitis, right hip: Secondary | ICD-10-CM | POA: Diagnosis not present

## 2023-05-28 DIAGNOSIS — Z96651 Presence of right artificial knee joint: Secondary | ICD-10-CM | POA: Diagnosis not present

## 2023-06-05 ENCOUNTER — Other Ambulatory Visit (INDEPENDENT_AMBULATORY_CARE_PROVIDER_SITE_OTHER): Payer: Self-pay | Admitting: Gastroenterology

## 2023-06-05 DIAGNOSIS — R110 Nausea: Secondary | ICD-10-CM

## 2023-06-13 NOTE — Progress Notes (Signed)
 Cardiology Office Note:  .   Date:  06/17/2023  ID:  Frank Weber, DOB 04/07/1951, MRN [id number] PCP: [name], MD  Sour Frank Weber Providers Cardiologist:  [name], MD Electrophysiologist:  [name], MD  }   History of Present Illness: .   Frank Weber is a 73 y.o. male with history of hyperlipidemia, TIA, with CT scan of the head revealing acute or subacute infarct versus artifact in the posterior right frontal lobe.  The MRI of the head revealed no signs of acute stroke.  Hypothyroidism, peripheral neuropathy, migraines, GERD, I last saw this patient on 02/25/2023 and repeated his echocardiogram, which was done at [facility] in [place] where he lives.  He now has an ILR placed by Dr. Furbish on 05/14/2023 (Medtronic LINQ II implantable loop recorder [id number])  he is having remote transmissions.  He is cardiac unaware concerning rapid heart rhythms which were found on ZIO monitor revealing strong burden of SVT.  He has since seen his primary care provider Dr. [name] who has adjusted his levothyroxine  from 25 mcg to 50 mcg daily.  He comes today without any cardiac complaints of dyspnea, palpitations, or fatigue.  He is medically compliant.  ROS: As above otherwise negative.  Studies Reviewed: .     Echocardiogram 04/30/2023 1. Left ventricular ejection fraction, by estimation, is 60 to 65%. The  left ventricle has normal function. The left ventricle has no regional  wall motion abnormalities. Left ventricular diastolic parameters were  normal.   2. Right ventricular systolic function is normal. The right ventricular  size is normal. There is normal pulmonary artery systolic pressure.   3. Right atrial size was mild to moderately dilated.   4. The mitral valve is normal in structure. No evidence of mitral valve  regurgitation. No evidence of mitral stenosis.   5. The aortic valve is tricuspid. Aortic valve regurgitation is not  visualized. No aortic  stenosis is present.   6. Aortic dilatation noted. There is borderline dilatation of the aortic  root, measuring 39 mm.   7. The inferior vena cava is normal in size with greater than 50%  respiratory variability, suggesting right atrial pressure of 3 mmHg.   Zio Monitor 03/18/2023 Patient had a min HR of 38 bpm, max HR of 160 bpm, and avg HR of 58 bpm. Predominant underlying rhythm was Sinus Rhythm. Slight P wave morphology changes were noted. 43 Supraventricular Tachycardia runs occurred, the run with the fastest interval lasting  6 beats with a max rate of 160 bpm, the longest lasting 18.0 secs with an avg rate of 101 bpm. Some episodes of Supraventricular Tachycardia may be possible Atrial Tachycardia with variable block. Isolated SVEs were rare (<1.0%), SVE Couplets were rare  (<1.0%), and SVE Triplets were rare (<1.0%). Isolated VEs were rare (<1.0%), VE Couplets were rare (<1.0%), and no VE Triplets were present. Ventricular Bigeminy was present.  Per Dr. Furbish: my interpretation Sinus rhythm 38 to 133 bpm, average 58 There were 43 episodes labeled SVT --these are most consistent with atrial runs.  The longest was 18 seconds. Overall ectopy burden was less than 1%   Carotid Artery Ultrasound 02/22/2023 IMPRESSION: 1. 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 2. Antegrade bilateral vertebral arterial flow.   NM Stress Test 06/08/2021 The study is normal. The study is low risk.   No ST deviation was noted.   LV perfusion is normal. There is no evidence of  ischemia. There is no evidence of infarction.   Left ventricular function is normal. End diastolic cavity size is normal. End systolic cavity size is normal.   Echocardiogram 06/08/2021  1. Left ventricular ejection fraction, by estimation, is 60 to 65%. The  left ventricle has normal function. The left ventricle has no regional  wall motion abnormalities. Left ventricular diastolic  parameters were  normal.   2. Right ventricular systolic function is normal. The right ventricular  size is normal. There is normal pulmonary artery systolic pressure.   3. Left atrial size was mildly dilated.   4. The mitral valve is normal in structure. No evidence of mitral valve  regurgitation. No evidence of mitral stenosis.   5. The aortic valve is tricuspid. Aortic valve regurgitation is not  visualized. No aortic stenosis is present.   6. Aortic dilatation noted. There is mild dilatation of the ascending  aorta, measuring 40 mm.   7. The inferior vena cava is normal in size with greater than 50%  respiratory variability, suggesting right atrial pressure of 3 mmHg.    Physical Exam:   VS:  BP 116/72 (BP Location: Left Arm, Patient Position: Sitting)   Pulse (!) 52   Ht 6' 3 (1.905 m)   Wt 219 lb 6.4 oz (99.5 kg)   SpO2 97%   BMI 27.42 kg/m    Wt Readings from Last 3 Encounters:  06/17/23 219 lb 6.4 oz (99.5 kg)  05/27/23 218 lb 9.6 oz (99.2 kg)  05/14/23 216 lb 6.4 oz (98.2 kg)    GEN: Well nourished, well developed in no acute distress NECK: No JVD; No carotid bruits CARDIAC: RRR, no murmurs, rubs, gallops RESPIRATORY:  Clear to auscultation without rales, wheezing or rhonchi  ABDOMEN: Soft, non-tender, non-distended EXTREMITIES:  No edema; No deformity   ASSESSMENT AND PLAN: .    Atrial tachycardia: ILR in situ placed by Dr. Nancey.  First remote transmission on 06/18/2023.  Continue follow-ups with electrophysiology.  He is currently not on any AV nodal blocking agents or antiarrhythmics at this time.  The patient is cardiac unaware.  I have spent time reviewing his echocardiogram results and answered questions concerning his LVEF, and normal valve function.  2.  Hypothyroidism: The patient has recently had his medications adjusted to increase levothyroxine  from 25 mcg daily to 50 mcg daily by Dr. Bertell his primary care provider.  3.  Hypercholesterolemia: Remains on  atorvastatin  20 mg daily.  Labs are followed by primary care.  4.  History of TIA: He remains on statin therapy and aspirin .  He has no residual effects or memory loss.     He prefers to follow with Dr. Delford in the [place] office as this is closer to his home.  Follow-up appointment will be made in 6 months in the [place] office with Dr. Delford.       Signed, Lamarr HERO. Jerilynn CHOL, ANP, AACC

## 2023-06-17 ENCOUNTER — Ambulatory Visit: Payer: Medicare Other | Attending: Adult Health | Admitting: Adult Health

## 2023-06-17 ENCOUNTER — Encounter: Payer: Self-pay | Admitting: Adult Health

## 2023-06-17 VITALS — BP 116/72 | HR 52 | Ht 75.0 in | Wt 219.4 lb

## 2023-06-17 DIAGNOSIS — I4719 Other supraventricular tachycardia: Secondary | ICD-10-CM | POA: Diagnosis not present

## 2023-06-17 DIAGNOSIS — E7800 Pure hypercholesterolemia, unspecified: Secondary | ICD-10-CM | POA: Diagnosis not present

## 2023-06-17 DIAGNOSIS — E039 Hypothyroidism, unspecified: Secondary | ICD-10-CM | POA: Insufficient documentation

## 2023-06-17 DIAGNOSIS — G459 Transient cerebral ischemic attack, unspecified: Secondary | ICD-10-CM | POA: Diagnosis not present

## 2023-06-17 NOTE — Patient Instructions (Signed)
Medication Instructions:  No Changes *If you need a refill on your cardiac medications before your next appointment, please call your pharmacy*   Lab Work: No Labs If you have labs (blood work) drawn today and your tests are completely normal, you will receive your results only by: MyChart Message (if you have MyChart) OR A paper copy in the mail If you have any lab test that is abnormal or we need to change your treatment, we will call you to review the results.   Testing/Procedures: No Testing   Follow-Up: At [facility], you and your health needs are our priority.  As part of our continuing mission to provide you with exceptional heart care, we have created designated Provider Care Teams.  These Care Teams include your primary Cardiologist (physician) and Advanced Practice Providers (APPs -  Physician Assistants and Nurse Practitioners) who all work together to provide you with the care you need, when you need it.  We recommend signing up for the patient portal called "MyChart".  Sign up information is provided on this After Visit Summary.  MyChart is used to connect with patients for Virtual Visits (Telemedicine).  Patients are able to view lab/test results, encounter notes, upcoming appointments, etc.  Non-urgent messages can be sent to your provider as well.   To learn more about what you can do with MyChart, go to [email or web address].    Your next appointment:   6 month(s)  Provider:   Charlton Haws, MD

## 2023-06-18 ENCOUNTER — Ambulatory Visit (INDEPENDENT_AMBULATORY_CARE_PROVIDER_SITE_OTHER): Payer: Medicare Other

## 2023-06-18 DIAGNOSIS — I495 Sick sinus syndrome: Secondary | ICD-10-CM | POA: Diagnosis not present

## 2023-06-19 LAB — CUP PACEART REMOTE DEVICE CHECK
Date Time Interrogation Session: 20250108142720
Implantable Pulse Generator Implant Date: 20241204

## 2023-07-10 DIAGNOSIS — L821 Other seborrheic keratosis: Secondary | ICD-10-CM | POA: Diagnosis not present

## 2023-07-10 DIAGNOSIS — L111 Transient acantholytic dermatosis [Grover]: Secondary | ICD-10-CM | POA: Diagnosis not present

## 2023-07-10 DIAGNOSIS — D485 Neoplasm of uncertain behavior of skin: Secondary | ICD-10-CM | POA: Diagnosis not present

## 2023-07-10 DIAGNOSIS — Z85828 Personal history of other malignant neoplasm of skin: Secondary | ICD-10-CM | POA: Diagnosis not present

## 2023-07-10 DIAGNOSIS — C44329 Squamous cell carcinoma of skin of other parts of face: Secondary | ICD-10-CM | POA: Diagnosis not present

## 2023-07-10 DIAGNOSIS — L570 Actinic keratosis: Secondary | ICD-10-CM | POA: Diagnosis not present

## 2023-07-23 ENCOUNTER — Ambulatory Visit (INDEPENDENT_AMBULATORY_CARE_PROVIDER_SITE_OTHER): Payer: Medicare Other

## 2023-07-23 DIAGNOSIS — I495 Sick sinus syndrome: Secondary | ICD-10-CM | POA: Diagnosis not present

## 2023-07-24 ENCOUNTER — Encounter: Payer: Self-pay | Admitting: Cardiovascular Disease

## 2023-07-25 LAB — CUP PACEART REMOTE DEVICE CHECK
Date Time Interrogation Session: 20250212142735
Implantable Pulse Generator Implant Date: 20241204

## 2023-07-30 NOTE — Addendum Note (Signed)
 Addended by: Elease Etienne A on: 07/30/2023 10:34 AM   Modules accepted: Orders

## 2023-07-30 NOTE — Progress Notes (Signed)
 Carelink Summary Report / Loop Recorder

## 2023-08-06 DIAGNOSIS — G629 Polyneuropathy, unspecified: Secondary | ICD-10-CM | POA: Diagnosis not present

## 2023-08-07 ENCOUNTER — Encounter: Payer: Self-pay | Admitting: Cardiovascular Disease

## 2023-08-19 DIAGNOSIS — C44329 Squamous cell carcinoma of skin of other parts of face: Secondary | ICD-10-CM | POA: Diagnosis not present

## 2023-08-19 DIAGNOSIS — Z85828 Personal history of other malignant neoplasm of skin: Secondary | ICD-10-CM | POA: Diagnosis not present

## 2023-08-26 DIAGNOSIS — D485 Neoplasm of uncertain behavior of skin: Secondary | ICD-10-CM | POA: Diagnosis not present

## 2023-08-26 DIAGNOSIS — C4441 Basal cell carcinoma of skin of scalp and neck: Secondary | ICD-10-CM | POA: Diagnosis not present

## 2023-08-27 ENCOUNTER — Ambulatory Visit (INDEPENDENT_AMBULATORY_CARE_PROVIDER_SITE_OTHER): Payer: Medicare Other

## 2023-08-27 DIAGNOSIS — I495 Sick sinus syndrome: Secondary | ICD-10-CM | POA: Diagnosis not present

## 2023-08-27 NOTE — Addendum Note (Signed)
 Addended by: Elease Etienne A on: 08/27/2023 05:02 PM   Modules accepted: Orders

## 2023-08-27 NOTE — Progress Notes (Signed)
 Carelink Summary Report / Loop Recorder

## 2023-08-28 LAB — CUP PACEART REMOTE DEVICE CHECK
Date Time Interrogation Session: 20250319142727
Implantable Pulse Generator Implant Date: 20241204

## 2023-09-03 ENCOUNTER — Encounter: Payer: Self-pay | Admitting: Cardiovascular Disease

## 2023-09-17 ENCOUNTER — Other Ambulatory Visit (INDEPENDENT_AMBULATORY_CARE_PROVIDER_SITE_OTHER): Payer: Self-pay | Admitting: Gastroenterology

## 2023-09-17 DIAGNOSIS — R110 Nausea: Secondary | ICD-10-CM

## 2023-09-23 ENCOUNTER — Encounter (INDEPENDENT_AMBULATORY_CARE_PROVIDER_SITE_OTHER): Payer: Self-pay | Admitting: Gastroenterology

## 2023-09-23 ENCOUNTER — Ambulatory Visit (INDEPENDENT_AMBULATORY_CARE_PROVIDER_SITE_OTHER): Payer: Medicare Other | Admitting: Gastroenterology

## 2023-09-23 VITALS — BP 124/74 | HR 76 | Temp 97.1°F | Ht 75.0 in | Wt 227.2 lb

## 2023-09-23 DIAGNOSIS — R110 Nausea: Secondary | ICD-10-CM | POA: Diagnosis not present

## 2023-09-23 DIAGNOSIS — K582 Mixed irritable bowel syndrome: Secondary | ICD-10-CM

## 2023-09-23 NOTE — Patient Instructions (Signed)
-  continue linzess 145mcg daily -Increase water intake, aim for atleast 64 oz per day -Increase fruits, veggies and whole grains, kiwi and prunes are especially good for constipation -continue zofran as needed for nausea  -please let me know if nausea worsens or you have vomiting as we may need to update EGD -I am providing the low FODMAP guide which may help with your IBS, if you wish to switch from linzess to something else, please let me know  Follow up 6 months  It was a pleasure to see you today. I want to create trusting relationships with patients and provide genuine, compassionate, and quality care. I truly value your feedback! please be on the lookout for a survey regarding your visit with me today. I appreciate your input about our visit and your time in completing this!    Sherisa Gilvin L. Melisia Leming, MSN, APRN, AGNP-C Adult-Gerontology Nurse Practitioner [facility] at [facility]

## 2023-09-23 NOTE — Progress Notes (Addendum)
 Referring Provider: Elfredia Nevins, MD Primary Care Physician:  Elfredia Nevins, MD Primary GI Physician: Dr. Levon Hedger   Chief Complaint  Patient presents with   Follow-up    Patient here today for a follow up on IBS with constipation and diarrhea. Patient says he is still having issues with both constipation and diarrhea. He is taking Linzess 145 daily and says his bowel habits are unpredictable.    HPI:   Frank Weber is a 73 y.o. male with past medical history of GERD, hypothyroidism, HLD, peripheral neuropathy, and IBS   Patient presenting today for:  Follow up of nausea, IBS and Hemorrhoids  Last seen December 2024, at that time patient reported taking Linzess 145 mcg daily, having sporadic BMs.  Harder stools to diarrhea abdominal pain usually improves with defecation.  Positive for malaise and straining.  Using Preparation H and sitz bath's.  Occasional blood" unlikely.  Tried Benefiber past which Diarrhea, MiraLAX also caused more diarrhea.  Occasionally skips dose of Linzess to be traveling.  Nausea may be worse recently though did have a recent cold which seemed to flare things up.  Appetite good.  No melena.  GERD symptoms.  Recommended to Continue Linzess 145 mcg, add Benefiber 1 tablespoon daily, continue Remeron 15 mg at bedtime, continue Zofran 4 mg as needed, continue sitz bath/Preparation H, avoid straining, limit toilet time, consider repeat EGD if nausea worsening, good water intake, diet high in whole grains fruits veggies water.  Present:  Continues to have a mix of constipation of diarrhea. Tried benefiber but did not see any improvement. Just taking linzess alone now. Continued lower abdominal cramping that improves with defecation. No rectal bleeding or melena. Some issues with hemorrhoids. States he may go a week or two and have normal BMs then may start with constipation and once he gets things moving he may have 4-5 episodes of diarrhea that day, most days will  resume back to more normal to constipated stools the next day. Sometimes can have mushy stools in between constipation and diarrhea. Is not skipping doses of linzess unless he goes on a trip or is traveling.    Still having some nausea, occurring several times per week. Feels that if he has diarrhea or constipation he may have nausea. He is trying to do more fruit to help with his digestion. Denies heartburn or acid regurgitation. No vomiting. Appetite is good. No weight loss. He is continued on elavil which has helped his appetite. He is using zofran PRN for his nausea which helps.   GES: 04/2022 normal  MR brain wo contrast 02/2023 negative CT chest abdomen pelvis with contrast 02/27/2022: -No acute findings within the chest, abdomen, or pelvis -Small nonspecific pulmonary nodules present, no need for follow-up imaging if low risk -Moderate stool burden within the colon -Sigmoid diverticulosis without diverticulitis Last EGD 01/2021 - Salmon-colored mucosa suspicious for short-segment Barrett's esophagus. Biopsied - reflux changes. - Erythematous mucosa in the antrum. Biopsied - reactive gastropathy neg for HP. - Normal examined duodenum.  Last Colonoscopy:12/2022  - One 4 mm polyp in the transverse colon, removed                            with a cold snare. Resected and retrieved.                           - Four 2 to 6 mm polyps  in the sigmoid colon and in                            the descending colon, removed with a cold snare.                            Resected and retrieved.                           - Diverticulosis in the sigmoid colon and in the                            descending colon.                           - Non-bleeding internal hemorrhoids. Pathology with hyperplastic polyp in descending and sigmoid colon, negative for dysplasia or carcinoma, tubular adenoma in transverse, negative for high-grade dysplasia or carcinoma   Repeat TCS July 2029  [facility] Weights   09/23/23  0942  Weight: 227 lb 3.2 oz (103.1 kg)     Past Medical History:  Diagnosis Date   Arthritis    right knee   Cancer (HCC)    Skin   Constipation    Frequency of urination    GERD (gastroesophageal reflux disease)    Heart murmur    "slight"    History of kidney stones    History of melanoma excision    2012--  NECK/ HEAD   History of squamous cell carcinoma excision    RIGHT EAR   Hyperlipidemia    Hypothyroidism    IBS (irritable bowel syndrome)    states slight   Lumbar stenosis    L5 - S1   Migraines    Peripheral neuropathy    legs and feet   Renal insufficiency    Right flank pain    Right ureteral stone    Urgency of urination    Wears glasses     Past Surgical History:  Procedure Laterality Date   BIOPSY  01/10/2021   Procedure: BIOPSY;  Surgeon: Umberto Ganong, Bearl Limes, MD;  [facility];  Service: Gastroenterology;;   CARDIAC CATHETERIZATION  07-08-2007  dr Silvestre Drum    no significant CAD, preserved LVF, ef 50%//  30% pLAD   CARDIOVASCULAR STRESS TEST  06-20-2011  dr croitoru   Low risk study/  normal perfusion scan showing attenuation artifact in the anterior region of myocardium with no ischemia , infarct or scar/  normal LV funciton and wall motion , ef 62%   COLONOSCOPY N/A 02/17/2013   Rehman:examination performed to cecum. mild sigmoid colon diverticulosis, 4 small polyps ablated via cold biopsy from rectosigmoid junction (hyperplastic). External hemorrhoids.   COLONOSCOPY WITH PROPOFOL N/A 01/10/2021   Procedure: COLONOSCOPY WITH PROPOFOL;  Surgeon: Urban Garden, MD;  [facility];  Service: Gastroenterology;  Laterality: N/A;   COLONOSCOPY WITH PROPOFOL N/A 01/01/2023   Procedure: COLONOSCOPY WITH PROPOFOL;  Surgeon: Urban Garden, MD;  [facility];  Service: Gastroenterology;  Laterality: N/A;  10:15am;asa 2   CYSTOSCOPY W/ URETERAL STENT PLACEMENT Right 06/18/2015   Procedure: CYSTOSCOPY  WITH RIGHT RETROGRADE PYELOGRAM RIGHT URETERAL STENT PLACEMENT;  Surgeon: Homero Luster, MD;  Location: [facility];  Service: Urology;  Laterality: Right;   CYSTOSCOPY/URETEROSCOPY/HOLMIUM LASER/STENT PLACEMENT  Right 06/27/2015   Procedure: CYSTOSCOPY RIGHT URETEROSCOPY  STENT REMOVAL; STONE EXTRACTION WITH BASKET;  Surgeon: Bjorn Pippin, MD;  Location: [facility];  Service: Urology;  Laterality: Right;   ESOPHAGOGASTRODUODENOSCOPY (EGD) WITH PROPOFOL N/A 01/10/2021   Procedure: ESOPHAGOGASTRODUODENOSCOPY (EGD) WITH PROPOFOL;  Surgeon: Dolores Frame, MD;  [facility];  Service: Gastroenterology;  Laterality: N/A;  11:00, moved up per Soledad Gerlach - pt knows arrival time   HEAD & NECK SKIN LESION EXCISIONAL BIOPSY     HOLMIUM LASER APPLICATION Right 06/27/2015   Procedure: HOLMIUM LASER LITHOTRIPSY ;  Surgeon: Bjorn Pippin, MD;  Location: [facility];  Service: Urology;  Laterality: Right;   I & D EXTREMITY Right 07/14/2018   Procedure: IRRIGATION AND DEBRIDEMENT RIGHT KNEE WITH CLOSURE;  Surgeon: Yolonda Kida, MD;  Location: [facility] OR;  Service: Orthopedics;  Laterality: Right;   INGUINAL HERNIA REPAIR Bilateral 10/21/2007   KNEE ARTHROSCOPY W/ ACL RECONSTRUCTION Right 1992   MENISCUS REPAIR Right    2019, dr Thomasena Edis    POLYPECTOMY  01/10/2021   Procedure: POLYPECTOMY;  Surgeon: Dolores Frame, MD;  [facility];  Service: Gastroenterology;;   POLYPECTOMY  01/01/2023   Procedure: POLYPECTOMY INTESTINAL;  Surgeon: Dolores Frame, MD;  [facility];  Service: Gastroenterology;;   SHOULDER ARTHROSCOPY WITH OPEN ROTATOR CUFF REPAIR Left 07/27/2014   Procedure: LEFT SHOULDER ARTHROSCOPY WITH MINI OPEN ROTATOR CUFF REPAIR;  Surgeon: Nestor Lewandowsky, MD;  Location: [facility];  Service: Orthopedics;  Laterality: Left;   SHOULDER ARTHROSCOPY WITH OPEN ROTATOR CUFF REPAIR Right 2006   TONSILLECTOMY AND  ADENOIDECTOMY  age 64   TOTAL KNEE ARTHROPLASTY Right 07/10/2018   Procedure: TOTAL KNEE ARTHROPLASTY;  Surgeon: Eugenia Mcalpine, MD;  Location: [facility];  Service: Orthopedics;  Laterality: Right;   TRANSTHORACIC ECHOCARDIOGRAM  07/30/2007   normal LVF, ef >55%/  mild AR, MR , PR and TR/  mild AV sclerosis without stenosis/    URETEROSCOPY Right 06/18/2015   Procedure: URETEROSCOPY;  Surgeon: Bjorn Pippin, MD;  Location: [facility];  Service: Urology;  Laterality: Right;    Current Outpatient Medications  Medication Sig Dispense Refill   ALPRAZOLAM XR 1 MG 24 hr tablet Take 1 mg by mouth at bedtime.     aspirin EC 81 MG tablet Take 1 tablet (81 mg total) by mouth daily. Swallow whole. 90 tablet 3   atorvastatin (LIPITOR) 20 MG tablet Take 40 mg by mouth daily.     Cholecalciferol (DIALYVITE VITAMIN D 5000) 125 MCG (5000 UT) capsule Take 5,000 Units by mouth daily.     gabapentin (NEURONTIN) 300 MG capsule Take 900 mg by mouth 4 (four) times daily.     levothyroxine (SYNTHROID) 50 MCG tablet Take 50 mcg by mouth daily.     linaclotide (LINZESS) 145 MCG CAPS capsule Take 145 mcg by mouth daily before breakfast.     LORazepam (ATIVAN) 0.5 MG tablet Take 0.5 mg by mouth 3 (three) times daily as needed for anxiety.     mirtazapine (REMERON) 15 MG tablet TAKE 1 TABLET BY MOUTH DAILY AT BEDTIME 90 tablet 1   nitroGLYCERIN (NITROSTAT) 0.4 MG SL tablet Place 1 tablet (0.4 mg total) under the tongue every 5 (five) minutes as needed. 25 tablet 3   ondansetron (ZOFRAN) 4 MG tablet TAKE (1) TABLET BY MOUTH EVERY EIGHT HOURS AS NEEDED FOR NAUSEA. 20 tablet 1   Polyethyl Glycol-Propyl Glycol (SYSTANE ULTRA) 0.4-0.3 %  SOLN Place 2 drops into both eyes as needed (for dry eyes).      Probiotic Product (TRUBIOTICS PO) Take 1 capsule by mouth at bedtime.     rizatriptan (MAXALT-MLT) 10 MG disintegrating tablet Take 10 mg by mouth as needed (for migraines and may repeat once in 2 hours, if no relief).       triamcinolone cream (KENALOG) 0.1 % Apply 1 Application topically as needed.     No current facility-administered medications for this visit.    Allergies as of 09/23/2023 - Review Complete 09/23/2023  Allergen Reaction Noted   Sulfa antibiotics Anaphylaxis, Shortness Of Breath, and Rash 07/20/2014   Coconut (cocos nucifera) Rash 07/20/2014    Social History   Socioeconomic History   Marital status: Married    Spouse name: Not on file   Number of children: Not on file   Years of education: Not on file   Highest education level: Not on file  Occupational History   Not on file  Tobacco Use   Smoking status: Some Days    Types: Cigars    Passive exposure: Current   Smokeless tobacco: Never   Tobacco comments:    Some days smokes cigars but not frequent.   Vaping Use   Vaping status: Never Used  Substance and Sexual Activity   Alcohol use: Yes    Comment: seldom    Drug use: No   Sexual activity: Not on file  Other Topics Concern   Not on file  Social History Narrative   Not on file   Social Drivers of Health   Financial Resource Strain: Not on file  Food Insecurity: Not on file  Transportation Needs: Not on file  Physical Activity: Not on file  Stress: Not on file  Social Connections: Not on file    Review of systems General: negative for malaise, night sweats, fever, chills, weight loss Neck: Negative for lumps, goiter, pain and significant neck swelling Resp: Negative for cough, wheezing, dyspnea at rest CV: Negative for chest pain, leg swelling, palpitations, orthopnea GI: denies melena, hematochezia, vomiting,  dysphagia, odyonophagia, early satiety or unintentional weight loss. +constipation +diarrhea +nausea +abdominal cramping  The remainder of the review of systems is noncontributory.  Physical Exam: BP 124/74 (BP Location: Left Arm, Patient Position: Sitting, Cuff Size: Normal)   Pulse 76   Temp (!) 97.1 F (36.2 C) (Temporal)   Ht 6\' 3"  (1.905 m)    Wt 227 lb 3.2 oz (103.1 kg)   BMI 28.40 kg/m  General:   Alert and oriented. No distress noted. Pleasant and cooperative.  Head:  Normocephalic and atraumatic. Eyes:  Conjuctiva clear without scleral icterus. Mouth:  Oral mucosa pink and moist. Good dentition. No lesions. Heart: Normal rate and rhythm, s1 and s2 heart sounds present.  Lungs: Clear lung sounds in all lobes. Respirations equal and unlabored. Abdomen:  +BS, soft, non-tender and non-distended. No rebound or guarding. No HSM or masses noted. Derm: No palmar erythema or jaundice Msk:  Symmetrical without gross deformities. Normal posture. Extremities:  Without edema. Neurologic:  Alert and  oriented x4 Psych:  Alert and cooperative. Normal mood and affect.  Invalid input(s): "6 MONTHS"   ASSESSMENT: Frank Weber is a 73 y.o. male presenting today for follow up of IBS-M and nausea   IBS-M: Linzess 145 mcg daily and Remeron 15 mg at bedtime.  Abdominal pain is improved, having some lower abdominal cramping on occasion which improves with defecation.  He does note  that he tends to still go between diarrhea and constipation, we discussed switching to alternative therapies besides Linzess though at this time he does not wish to change.  He did not have any improvement with addition of Benefiber as recommended at last visit.  For now we will continue Linzess 145 mcg daily, Remeron 15 mg at bedtime and implementation of low FODMAP diet as I suspect a lot of his dietary factors are contributing to his worsening IBS symptoms.  Nausea: Still with some intermittent nausea, usually when he has worsening constipation or diarrhea.  No changes in appetite, no weight loss, no vomiting.  We discussed proceeding with upper endoscopy for further evaluation though he does not wish to pursue this right now.  Query if nausea is secondary to his IBS/linzess.  As above will continue with Linzess, Remeron and implement low FODMAP diet.   if he has  worsening of nausea, new onset vomiting or changes in appetite, he should let me know as we would need to proceed with upper endoscopy for further evaluation.   PLAN:  -continue linzess 145mcg daily -Increase water intake, aim for atleast 64 oz per day -Increase fruits, veggies and whole grains, kiwi and prunes are especially good for constipation -low FODMAP diet -continue zofran PRN -EGD if nausea worsens, vomiting or change in appetite occurs   All questions were answered, patient verbalized understanding and is in agreement with plan as outlined above.   Follow Up: 6 months   Demetrick Eichenberger L. Adrien Alberta, MSN, APRN, AGNP-C Adult-Gerontology Nurse Practitioner [facility]  I have reviewed the note and agree with the APP's assessment as described in this progress note  Samantha Cress, MD Gastroenterology and Hepatology [facility]

## 2023-09-27 ENCOUNTER — Encounter (INDEPENDENT_AMBULATORY_CARE_PROVIDER_SITE_OTHER): Payer: Self-pay

## 2023-09-30 ENCOUNTER — Other Ambulatory Visit (INDEPENDENT_AMBULATORY_CARE_PROVIDER_SITE_OTHER): Payer: Self-pay | Admitting: Gastroenterology

## 2023-09-30 DIAGNOSIS — R1030 Lower abdominal pain, unspecified: Secondary | ICD-10-CM

## 2023-09-30 DIAGNOSIS — K5900 Constipation, unspecified: Secondary | ICD-10-CM

## 2023-09-30 DIAGNOSIS — K625 Hemorrhage of anus and rectum: Secondary | ICD-10-CM

## 2023-10-01 ENCOUNTER — Ambulatory Visit (HOSPITAL_COMMUNITY)
Admission: RE | Admit: 2023-10-01 | Discharge: 2023-10-01 | Disposition: A | Discharge status: Home / Self Care | Source: Ambulatory Visit | Attending: Gastroenterology | Admitting: Gastroenterology

## 2023-10-01 ENCOUNTER — Other Ambulatory Visit (INDEPENDENT_AMBULATORY_CARE_PROVIDER_SITE_OTHER): Payer: Self-pay | Admitting: Gastroenterology

## 2023-10-01 ENCOUNTER — Ambulatory Visit (INDEPENDENT_AMBULATORY_CARE_PROVIDER_SITE_OTHER): Payer: Medicare Other

## 2023-10-01 DIAGNOSIS — K5900 Constipation, unspecified: Secondary | ICD-10-CM | POA: Insufficient documentation

## 2023-10-01 DIAGNOSIS — R1030 Lower abdominal pain, unspecified: Secondary | ICD-10-CM | POA: Diagnosis not present

## 2023-10-01 DIAGNOSIS — I495 Sick sinus syndrome: Secondary | ICD-10-CM | POA: Diagnosis not present

## 2023-10-01 DIAGNOSIS — K625 Hemorrhage of anus and rectum: Secondary | ICD-10-CM | POA: Insufficient documentation

## 2023-10-01 MED ORDER — IOHEXOL 9 MG/ML PO SOLN
500.0000 mL | ORAL | Status: AC
  Administered 2023-10-01: 1000 mL via ORAL

## 2023-10-01 MED ORDER — IOHEXOL 300 MG/ML  SOLN
100.0000 mL | Freq: Once | INTRAMUSCULAR | Status: AC | PRN
  Administered 2023-10-01: 100 mL via INTRAVENOUS

## 2023-10-01 MED ORDER — METRONIDAZOLE 500 MG PO TABS: 500.0000 mg | ORAL_TABLET | Freq: Two times a day (BID) | ORAL | 0 refills | Status: AC

## 2023-10-01 MED ORDER — CIPROFLOXACIN HCL 500 MG PO TABS: 500.0000 mg | ORAL_TABLET | Freq: Two times a day (BID) | ORAL | 0 refills | Status: AC

## 2023-10-02 LAB — CUP PACEART REMOTE DEVICE CHECK
Date Time Interrogation Session: 20250423142726
Implantable Pulse Generator Implant Date: 20241204

## 2023-10-09 ENCOUNTER — Encounter: Payer: Self-pay | Admitting: Cardiovascular Disease

## 2023-10-10 NOTE — Progress Notes (Signed)
 Carelink Summary Report / Loop Recorder

## 2023-10-10 NOTE — Addendum Note (Signed)
 Addended by: Lott Rouleau A on: 10/10/2023 11:56 AM   Modules accepted: Orders

## 2023-10-16 ENCOUNTER — Other Ambulatory Visit (INDEPENDENT_AMBULATORY_CARE_PROVIDER_SITE_OTHER): Payer: Self-pay | Admitting: Gastroenterology

## 2023-10-16 MED ORDER — DICYCLOMINE HCL 10 MG PO CAPS: 10.0000 mg | ORAL_CAPSULE | Freq: Three times a day (TID) | ORAL | 1 refills | Status: AC | PRN

## 2023-10-17 DIAGNOSIS — N183 Chronic kidney disease, stage 3 unspecified: Secondary | ICD-10-CM | POA: Diagnosis not present

## 2023-10-17 DIAGNOSIS — G4709 Other insomnia: Secondary | ICD-10-CM | POA: Diagnosis not present

## 2023-10-17 DIAGNOSIS — Z6827 Body mass index (BMI) 27.0-27.9, adult: Secondary | ICD-10-CM | POA: Diagnosis not present

## 2023-10-17 DIAGNOSIS — F419 Anxiety disorder, unspecified: Secondary | ICD-10-CM | POA: Diagnosis not present

## 2023-10-17 DIAGNOSIS — E663 Overweight: Secondary | ICD-10-CM | POA: Diagnosis not present

## 2023-11-05 ENCOUNTER — Ambulatory Visit: Payer: Medicare Other

## 2023-11-05 DIAGNOSIS — I495 Sick sinus syndrome: Secondary | ICD-10-CM

## 2023-11-06 ENCOUNTER — Ambulatory Visit (INDEPENDENT_AMBULATORY_CARE_PROVIDER_SITE_OTHER): Admitting: Gastroenterology

## 2023-11-06 ENCOUNTER — Encounter (INDEPENDENT_AMBULATORY_CARE_PROVIDER_SITE_OTHER): Payer: Self-pay | Admitting: Gastroenterology

## 2023-11-06 VITALS — BP 97/56 | HR 54 | Temp 97.7°F | Ht 75.0 in | Wt 226.7 lb

## 2023-11-06 DIAGNOSIS — Z8719 Personal history of other diseases of the digestive system: Secondary | ICD-10-CM | POA: Insufficient documentation

## 2023-11-06 DIAGNOSIS — K582 Mixed irritable bowel syndrome: Secondary | ICD-10-CM

## 2023-11-06 LAB — CUP PACEART REMOTE DEVICE CHECK
Date Time Interrogation Session: 20250527233301
Implantable Pulse Generator Implant Date: 20241204

## 2023-11-06 NOTE — Progress Notes (Addendum)
 Referring Provider: Kathyleen Parkins, MD Primary Care Physician:  Kathyleen Parkins, MD Primary GI Physician: Dr. Sammi Crick   Chief Complaint  Patient presents with   Follow-up    Pt arrives for follow up. Pt states he is still having issues with constipation or diarrhea. Cramping and pain has subsided. Still having uncomfortable feeling in general abdominal area. Constipated this morning and yesterday. Some nausea, no vomiting, no fever. Appetite is good. Drinks a lot of coffee.    HPI:   Frank Weber is a 73 y.o. male with past medical history of GERD, hypothyroidism, HLD, peripheral neuropathy, and IBS   Patient presenting today for:  Follow up of diverticulitis, IBS-M  Last seen April 15th, at that time, having mix of constipation and diarrhea on linzess , did not see where benefiber helped. Some continued lower abdominal cramping. Some nausea usually when he is having diarrhea or constipation worse. Continued on elavil which has helped some.   Recommended to continue linzess  145mcg daily, low FODMAP diet, continue zofran  PRN for nausea, EGD if worsening  -Patient sent a message on 4/19 with worsening constipation and abdominal pain, concerning for diverticulitis He underwent CT A/P with contrast on 10/01/23 Acute sigmoid diverticulitis. No diverticular abscess or perforation. No bowel obstruction. Normal appendix. -given cipro  and flagyl  x10 days   Present: Feeling better since doing antibiotics for diverticulitis. Pain has mostly resolved. No rectal bleeding or melena. Continues to have mix of constipation and diarrhea on linzess  though more constipation recently. Having a BM sometimes a couple times per week and sometimes can be multiple times per day.  Still taking linzess  and does not wish to switch to anything else right now. He has tried benefiber without much improvement. Water  intake is not great, maybe around 40 oz, drinks a lot of coffee. Does some veggies but not many.    GES: 04/2022 normal  MR brain wo contrast 02/2023 negative CT chest abdomen pelvis with contrast 02/27/2022: -No acute findings within the chest, abdomen, or pelvis -Small nonspecific pulmonary nodules present, no need for follow-up imaging if low risk -Moderate stool burden within the colon -Sigmoid diverticulosis without diverticulitis Last EGD 01/2021 - Salmon-colored mucosa suspicious for short-segment Barrett's esophagus. Biopsied - reflux changes. - Erythematous mucosa in the antrum. Biopsied - reactive gastropathy neg for HP. - Normal examined duodenum. Last Colonoscopy:12/2022  - One 4 mm polyp in the transverse colon, removed                            with a cold snare. Resected and retrieved.                           - Four 2 to 6 mm polyps in the sigmoid colon and in                            the descending colon, removed with a cold snare.                            Resected and retrieved.                           - Diverticulosis in the sigmoid colon and in the  descending colon.                           - Non-bleeding internal hemorrhoids. Pathology with hyperplastic polyp in descending and sigmoid colon, negative for dysplasia or carcinoma, tubular adenoma in transverse, negative for high-grade dysplasia or carcinoma   Repeat TCS July 2029  Past Medical History:  Diagnosis Date   Arthritis    right knee   Cancer (HCC)    Skin   Constipation    Frequency of urination    GERD (gastroesophageal reflux disease)    Heart murmur    "slight"    History of kidney stones    History of melanoma excision    2012--  NECK/ HEAD   History of squamous cell carcinoma excision    RIGHT EAR   Hyperlipidemia    Hypothyroidism    IBS (irritable bowel syndrome)    states slight   Lumbar stenosis    L5 - S1   Migraines    Peripheral neuropathy    legs and feet   Renal insufficiency    Right flank pain    Right ureteral stone    Urgency of  urination    Wears glasses     Past Surgical History:  Procedure Laterality Date   BIOPSY  01/10/2021   Procedure: BIOPSY;  Surgeon: Umberto Ganong, Bearl Limes, MD;  Location: [facility];  Service: Gastroenterology;;   CARDIAC CATHETERIZATION  07-08-2007  dr Silvestre Drum    no significant CAD, preserved LVF, ef 50%//  30% pLAD   CARDIOVASCULAR STRESS TEST  06-20-2011  dr croitoru   Low risk study/  normal perfusion scan showing attenuation artifact in the anterior region of myocardium with no ischemia , infarct or scar/  normal LV funciton and wall motion , ef 62%   COLONOSCOPY N/A 02/17/2013   Rehman:examination performed to cecum. mild sigmoid colon diverticulosis, 4 small polyps ablated via cold biopsy from rectosigmoid junction (hyperplastic). External hemorrhoids.   COLONOSCOPY WITH PROPOFOL  N/A 01/10/2021   Procedure: COLONOSCOPY WITH PROPOFOL ;  Surgeon: Urban Garden, MD;  [facility]: [facility];  Service: Gastroenterology;  Laterality: N/A;   COLONOSCOPY WITH PROPOFOL  N/A 01/01/2023   Procedure: COLONOSCOPY WITH PROPOFOL ;  Surgeon: Urban Garden, MD;  Location: [facility];  Service: Gastroenterology;  Laterality: N/A;  10:15am;asa 2   CYSTOSCOPY W/ URETERAL STENT PLACEMENT Right 06/18/2015   Procedure: CYSTOSCOPY WITH RIGHT RETROGRADE PYELOGRAM RIGHT URETERAL STENT PLACEMENT;  Surgeon: Homero Luster, MD;  Location: [facility];  Service: Urology;  Laterality: Right;   CYSTOSCOPY/URETEROSCOPY/HOLMIUM LASER/STENT PLACEMENT Right 06/27/2015   Procedure: CYSTOSCOPY RIGHT URETEROSCOPY  STENT REMOVAL; STONE EXTRACTION WITH BASKET;  Surgeon: Homero Luster, MD;  Location: [facility];  Service: Urology;  Laterality: Right;   ESOPHAGOGASTRODUODENOSCOPY (EGD) WITH PROPOFOL  N/A 01/10/2021   Procedure: ESOPHAGOGASTRODUODENOSCOPY (EGD) WITH PROPOFOL ;  Surgeon: Urban Garden, MD;  [facility]: [facility];  Service: Gastroenterology;  Laterality: N/A;  11:00,  moved up per Hugo Maes - pt knows arrival time   HEAD & NECK SKIN LESION EXCISIONAL BIOPSY     HOLMIUM LASER APPLICATION Right 06/27/2015   Procedure: HOLMIUM LASER LITHOTRIPSY ;  Surgeon: Homero Luster, MD;  Location: [facility];  Service: Urology;  Laterality: Right;   I & D EXTREMITY Right 07/14/2018   Procedure: IRRIGATION AND DEBRIDEMENT RIGHT KNEE WITH CLOSURE;  Surgeon: Janeth Medicus, MD;  Location[facility];  Service: Orthopedics;  Laterality:  Right;   INGUINAL HERNIA REPAIR Bilateral 10/21/2007   KNEE ARTHROSCOPY W/ ACL RECONSTRUCTION Right 1992   MENISCUS REPAIR Right    2019, dr Hazeline Lister    POLYPECTOMY  01/10/2021   Procedure: POLYPECTOMY;  Surgeon: Urban Garden, MD;  [facility];  Service: Gastroenterology;;   POLYPECTOMY  01/01/2023   Procedure: POLYPECTOMY INTESTINAL;  Surgeon: Urban Garden, MD;  [facility];  Service: Gastroenterology;;   SHOULDER ARTHROSCOPY WITH OPEN ROTATOR CUFF REPAIR Left 07/27/2014   Procedure: LEFT SHOULDER ARTHROSCOPY WITH MINI OPEN ROTATOR CUFF REPAIR;  Surgeon: Ilean Mall, MD;  Location: [facility];  Service: Orthopedics;  Laterality: Left;   SHOULDER ARTHROSCOPY WITH OPEN ROTATOR CUFF REPAIR Right 2006   TONSILLECTOMY AND ADENOIDECTOMY  age 28   TOTAL KNEE ARTHROPLASTY Right 07/10/2018   Procedure: TOTAL KNEE ARTHROPLASTY;  Surgeon: Genevie Kerns, MD;  Location: [facility];  Service: Orthopedics;  Laterality: Right;   TRANSTHORACIC ECHOCARDIOGRAM  07/30/2007   normal LVF, ef >55%/  mild AR, MR , PR and TR/  mild AV sclerosis without stenosis/    URETEROSCOPY Right 06/18/2015   Procedure: URETEROSCOPY;  Surgeon: Homero Luster, MD;  Location: [facility];  Service: Urology;  Laterality: Right;    Current Outpatient Medications  Medication Sig Dispense Refill   ALPRAZOLAM  XR 1 MG 24 hr tablet Take 1 mg by mouth at bedtime.     aspirin  EC 81 MG tablet Take 1 tablet (81 mg  total) by mouth daily. Swallow whole. 90 tablet 3   atorvastatin  (LIPITOR) 20 MG tablet Take 40 mg by mouth daily.     Cholecalciferol  (DIALYVITE VITAMIN D  5000) 125 MCG (5000 UT) capsule Take 5,000 Units by mouth daily.     dicyclomine  (BENTYL ) 10 MG capsule Take 1 capsule (10 mg total) by mouth 3 (three) times daily as needed for spasms. 60 capsule 1   gabapentin  (NEURONTIN ) 300 MG capsule Take 900 mg by mouth 4 (four) times daily.     levothyroxine  (SYNTHROID ) 50 MCG tablet Take 50 mcg by mouth daily.     linaclotide  (LINZESS ) 145 MCG CAPS capsule Take 145 mcg by mouth daily before breakfast.     LORazepam  (ATIVAN ) 0.5 MG tablet Take 0.5 mg by mouth 3 (three) times daily as needed for anxiety.     mirtazapine  (REMERON ) 15 MG tablet TAKE 1 TABLET BY MOUTH DAILY AT BEDTIME 90 tablet 1   nitroGLYCERIN  (NITROSTAT ) 0.4 MG SL tablet Place 1 tablet (0.4 mg total) under the tongue every 5 (five) minutes as needed. 25 tablet 3   ondansetron  (ZOFRAN ) 4 MG tablet TAKE (1) TABLET BY MOUTH EVERY EIGHT HOURS AS NEEDED FOR NAUSEA. 20 tablet 1   Polyethyl Glycol-Propyl Glycol (SYSTANE ULTRA) 0.4-0.3 % SOLN Place 2 drops into both eyes as needed (for dry eyes).      Probiotic Product (TRUBIOTICS PO) Take 1 capsule by mouth at bedtime.     rizatriptan  (MAXALT -MLT) 10 MG disintegrating tablet Take 10 mg by mouth as needed (for migraines and may repeat once in 2 hours, if no relief).      triamcinolone  cream (KENALOG ) 0.1 % Apply 1 Application topically as needed.     No current facility-administered medications for this visit.    Allergies as of 11/06/2023 - Review Complete 11/06/2023  Allergen Reaction Noted   Sulfa antibiotics Anaphylaxis, Shortness Of Breath, and Rash 07/20/2014   Coconut (cocos nucifera) Rash 07/20/2014    Social History   Socioeconomic  History   Marital status: Married    Spouse name: Not on file   Number of children: Not on file   Years of education: Not on file   Highest  education level: Not on file  Occupational History   Not on file  Tobacco Use   Smoking status: Some Days    Types: Cigars    Passive exposure: Current   Smokeless tobacco: Never   Tobacco comments:    Some days smokes cigars but not frequent.   Vaping Use   Vaping status: Never Used  Substance and Sexual Activity   Alcohol use: Yes    Comment: seldom    Drug use: No   Sexual activity: Not on file  Other Topics Concern   Not on file  Social History Narrative   Not on file   Social Drivers of Health   Financial Resource Strain: Not on file  Food Insecurity: Not on file  Transportation Needs: Not on file  Physical Activity: Not on file  Stress: Not on file  Social Connections: Not on file    Review of systems General: negative for malaise, night sweats, fever, chills, weight loss Neck: Negative for lumps, goiter, pain and significant neck swelling Resp: Negative for cough, wheezing, dyspnea at rest CV: Negative for chest pain, leg swelling, palpitations, orthopnea GI: denies melena, hematochezia, nausea, vomiting, dysphagia, odyonophagia, early satiety or unintentional weight loss. +constipation +diarrhea  MSK: Negative for joint pain or swelling, back pain, and muscle pain. Derm: Negative for itching or rash Psych: Denies depression, anxiety, memory loss, confusion. No homicidal or suicidal ideation.  Heme: Negative for prolonged bleeding, bruising easily, and swollen nodes. Endocrine: Negative for cold or heat intolerance, polyuria, polydipsia and goiter. Neuro: negative for tremor, gait imbalance, syncope and seizures. The remainder of the review of systems is noncontributory.  Physical Exam: BP (!) 97/56   Pulse (!) 54   Temp 97.7 F (36.5 C)   Ht 6\' 3"  (1.905 m)   Wt 226 lb 11.2 oz (102.8 kg)   BMI 28.34 kg/m  General:   Alert and oriented. No distress noted. Pleasant and cooperative.  Head:  Normocephalic and atraumatic. Eyes:  Conjuctiva clear without  scleral icterus. Mouth:  Oral mucosa pink and moist. Good dentition. No lesions. Heart: Normal rate and rhythm, s1 and s2 heart sounds present.  Lungs: Clear lung sounds in all lobes. Respirations equal and unlabored. Abdomen:  +BS, soft, non-tender and non-distended. No rebound or guarding. No HSM or masses noted. Derm: No palmar erythema or jaundice Msk:  Symmetrical without gross deformities. Normal posture. Extremities:  Without edema. Neurologic:  Alert and  oriented x4 Psych:  Alert and cooperative. Normal mood and affect.  Invalid input(s): "6 MONTHS"   ASSESSMENT: Frank Weber is a 73 y.o. male presenting today for follow up of diverticulitis, IBS-M   CT confirmed, acute, uncomplicated diverticulitis in April, treated with cipro  and flagyl  with resolution of symptoms. Last TCS was July 2024, given this was less than 1 year, will confirm with Dr. Sammi Crick but likely would not recommend repeat Colonoscopy this soon as last was within the year. I did discuss importance of high fiber diet as this has been show to prevent further episodes of diverticulitis. Most recent studies do not necessarily suggest nuts/ seeds need to be avoided if there has been an episode of diverticulitis but I recommend being cautious and consuming these in moderation.   IBS-M: continues to have mix of constipation and diarrhea  on linzess  145mcg with very unpredictable bowel habits that adversely affects his daily life. We have discussed switching to different therapy in the past which he was hesitant to do, he is still unsure if he wishes to change therapy but we did talk about the possiblity of trying amitiza  or IBsrela, I would be hesitant to change the dose of his linzess  for fear of either worsening diarrhea with higher dose or risking worsening constipation with lower as he can sometimes have up to 5 watery stools per day currently. He will read about amitiza  and ibsrela and make me aware if he wishes to switch  therapy.    PLAN:  -continue linzess  145mcg for now -consider amitiza /ibsrela, pt to let me know if he wishes to switch therapy -high fiber diet -good water  intake, -be mindful of nuts/seeds -confirm with Dr. Sammi Crick if repeat Colonoscopy is needed   All questions were answered, patient verbalized understanding and is in agreement with plan as outlined above.    Follow Up: [facility] L. Adrien Alberta, MSN, APRN, AGNP-C Adult-Gerontology Nurse Practitioner [facility]  I have reviewed the note and agree with the APP's assessment as described in this progress note  Samantha Cress, MD Gastroenterology and Hepatology [facility] Gastroenterology

## 2023-11-06 NOTE — Patient Instructions (Signed)
 Increase water  intake, aim for atleast 64 oz per day Increase fruits, veggies and whole grains, kiwi and prunes are especially good for constipation High fiber diet is helpful in preventing further episodes of diverticulitis We can continue linzess  for now if you do not want to switch, however, there are other options such as amitiza  or ibsrela that may work better Let me know if you wish to try these  It was a pleasure to see you today. I want to create trusting relationships with patients and provide genuine, compassionate, and quality care. I truly value your feedback! please be on the lookout for a survey regarding your visit with me today. I appreciate your input about our visit and your time in completing this!    Giancarlo Askren L. Alizee Maple, MSN, APRN, AGNP-C Adult-Gerontology Nurse Practitioner [facility] Gastroenterology at [facility], [place]

## 2023-11-08 ENCOUNTER — Other Ambulatory Visit (INDEPENDENT_AMBULATORY_CARE_PROVIDER_SITE_OTHER): Payer: Self-pay | Admitting: Gastroenterology

## 2023-11-08 DIAGNOSIS — F419 Anxiety disorder, unspecified: Secondary | ICD-10-CM | POA: Diagnosis not present

## 2023-11-08 DIAGNOSIS — I700 Atherosclerosis of aorta: Secondary | ICD-10-CM | POA: Diagnosis not present

## 2023-11-08 DIAGNOSIS — N183 Chronic kidney disease, stage 3 unspecified: Secondary | ICD-10-CM | POA: Diagnosis not present

## 2023-11-12 ENCOUNTER — Ambulatory Visit: Payer: Self-pay | Admitting: Cardiovascular Disease

## 2023-11-13 NOTE — Progress Notes (Signed)
 Carelink Summary Report / Loop Recorder

## 2023-11-13 NOTE — Addendum Note (Signed)
 Addended by: Lott Rouleau A on: 11/13/2023 10:49 AM   Modules accepted: Orders

## 2023-12-05 ENCOUNTER — Ambulatory Visit (INDEPENDENT_AMBULATORY_CARE_PROVIDER_SITE_OTHER)

## 2023-12-05 DIAGNOSIS — I495 Sick sinus syndrome: Secondary | ICD-10-CM

## 2023-12-05 LAB — CUP PACEART REMOTE DEVICE CHECK
Date Time Interrogation Session: 20250626235614
Implantable Pulse Generator Implant Date: 20241204

## 2023-12-14 ENCOUNTER — Ambulatory Visit: Payer: Self-pay | Admitting: Cardiovascular Disease

## 2023-12-25 ENCOUNTER — Other Ambulatory Visit (INDEPENDENT_AMBULATORY_CARE_PROVIDER_SITE_OTHER): Payer: Self-pay

## 2023-12-25 DIAGNOSIS — R110 Nausea: Secondary | ICD-10-CM

## 2023-12-25 MED ORDER — ONDANSETRON HCL 4 MG PO TABS: 4.0000 mg | ORAL_TABLET | Freq: Three times a day (TID) | ORAL | 1 refills | Status: DC | PRN

## 2023-12-26 NOTE — Addendum Note (Signed)
 Addended by: VICCI SELLER A on: 12/26/2023 02:22 PM   Modules accepted: Orders

## 2023-12-26 NOTE — Progress Notes (Signed)
 Carelink Summary Report / Loop Recorder

## 2024-01-05 ENCOUNTER — Ambulatory Visit (INDEPENDENT_AMBULATORY_CARE_PROVIDER_SITE_OTHER)

## 2024-01-05 DIAGNOSIS — I495 Sick sinus syndrome: Secondary | ICD-10-CM | POA: Diagnosis not present

## 2024-01-05 LAB — CUP PACEART REMOTE DEVICE CHECK
Date Time Interrogation Session: 20250727235518
Implantable Pulse Generator Implant Date: 20241204

## 2024-01-19 ENCOUNTER — Ambulatory Visit: Payer: Self-pay | Admitting: Cardiovascular Disease

## 2024-02-05 ENCOUNTER — Ambulatory Visit (INDEPENDENT_AMBULATORY_CARE_PROVIDER_SITE_OTHER)

## 2024-02-05 DIAGNOSIS — I2510 Atherosclerotic heart disease of native coronary artery without angina pectoris: Secondary | ICD-10-CM

## 2024-02-06 LAB — CUP PACEART REMOTE DEVICE CHECK
Date Time Interrogation Session: 20250827234409
Implantable Pulse Generator Implant Date: 20241204

## 2024-02-12 ENCOUNTER — Ambulatory Visit: Payer: Self-pay | Admitting: Cardiovascular Disease

## 2024-02-12 NOTE — Progress Notes (Signed)
 Carelink Summary Report / Loop Recorder

## 2024-02-13 NOTE — Progress Notes (Signed)
 Remote Loop Recorder Transmission

## 2024-02-20 ENCOUNTER — Encounter: Payer: Self-pay | Admitting: Family Medicine

## 2024-02-20 ENCOUNTER — Ambulatory Visit (INDEPENDENT_AMBULATORY_CARE_PROVIDER_SITE_OTHER): Payer: Self-pay | Admitting: Family Medicine

## 2024-02-20 VITALS — BP 117/73 | HR 52 | Ht 75.0 in | Wt 218.0 lb

## 2024-02-20 DIAGNOSIS — Z23 Encounter for immunization: Secondary | ICD-10-CM | POA: Diagnosis not present

## 2024-02-20 DIAGNOSIS — Z1159 Encounter for screening for other viral diseases: Secondary | ICD-10-CM | POA: Diagnosis not present

## 2024-02-20 DIAGNOSIS — G629 Polyneuropathy, unspecified: Secondary | ICD-10-CM | POA: Insufficient documentation

## 2024-02-20 DIAGNOSIS — E038 Other specified hypothyroidism: Secondary | ICD-10-CM | POA: Diagnosis not present

## 2024-02-20 DIAGNOSIS — E7849 Other hyperlipidemia: Secondary | ICD-10-CM

## 2024-02-20 DIAGNOSIS — E782 Mixed hyperlipidemia: Secondary | ICD-10-CM | POA: Diagnosis not present

## 2024-02-20 DIAGNOSIS — R7301 Impaired fasting glucose: Secondary | ICD-10-CM | POA: Diagnosis not present

## 2024-02-20 DIAGNOSIS — E559 Vitamin D deficiency, unspecified: Secondary | ICD-10-CM

## 2024-02-20 NOTE — Assessment & Plan Note (Signed)
 Encouraged to continue taking atorvastatin  40 mg daily  Lifestyle modifications were also discussed, including avoiding simple carbohydrates such as cakes, sweet desserts, ice cream, soda (diet or regular), sweet tea, candies, chips, cookies, store-bought juices, excessive alcohol (more than 1-2 drinks per day), lemonade, artificial sweeteners, donuts, coffee creamers, and sugar-free products. Additionally, the patient was advised to reduce the consumption of greasy, fatty foods and increase physical activity to support cardiovascular health. The patient verbalized understanding and is aware of the plan of care.

## 2024-02-20 NOTE — Assessment & Plan Note (Signed)
 Encouraged to continue taking synthroid  50 mcg daily The patient is encouraged to follow up if experiencing symptoms of hypothyroidism, despite compliance with the current treatment regimen, such as fatigue, weight gain, constipation, dry skin, cold intolerance, hair loss, or depression.

## 2024-02-20 NOTE — Assessment & Plan Note (Signed)
 Patient educated on CDC recommendation for the vaccine. Verbal consent was obtained from the patient, vaccine administered by nurse, no sign of adverse reactions noted at this time. Patient education on arm soreness and use of tylenol or ibuprofen for this patient  was discussed. Patient educated on the signs and symptoms of adverse effect and advise to contact the office if they occur.

## 2024-02-20 NOTE — Progress Notes (Signed)
 New Patient Office Visit  Subjective:  Patient ID: Frank Weber, male    DOB: 25-Sep-1950  Age: 73 y.o. MRN: [id number]  CC:  Chief Complaint  Patient presents with   Establish Care    HPI Frank Weber is a 73 y.o. male with past medical history neuropathy, Hypothyroidism, migraines, hyperlipidemia presents for establishing care.  For the details of today's visit, please refer to the assessment and plan.    Past Medical History:  Diagnosis Date   Allergy    Sulfa drugs   Arthritis    right knee   Cancer (HCC)    Skin   Constipation    Frequency of urination    GERD (gastroesophageal reflux disease)    Heart murmur    slight    History of kidney stones    History of melanoma excision    2012--  NECK/ HEAD   History of squamous cell carcinoma excision    RIGHT EAR   Hyperlipidemia    Hypothyroidism    IBS (irritable bowel syndrome)    states slight   Lumbar stenosis    L5 - S1   Migraines    Peripheral neuropathy    legs and feet   Renal insufficiency    Right flank pain    Right ureteral stone    Urgency of urination    Wears glasses     Past Surgical History:  Procedure Laterality Date   BIOPSY  01/10/2021   Procedure: BIOPSY;  Surgeon: Eartha Angelia Toribio, MD;  [facility];  Service: Gastroenterology;;   CARDIAC CATHETERIZATION  07-08-2007  dr herminio    no significant CAD, preserved LVF, ef 50%//  30% pLAD   CARDIOVASCULAR STRESS TEST  06-20-2011  dr croitoru   Low risk study/  normal perfusion scan showing attenuation artifact in the anterior region of myocardium with no ischemia , infarct or scar/  normal LV funciton and wall motion , ef 62%   COLONOSCOPY N/A 02/17/2013   Rehman:examination performed to cecum. mild sigmoid colon diverticulosis, 4 small polyps ablated via cold biopsy from rectosigmoid junction (hyperplastic). External hemorrhoids.   COLONOSCOPY WITH PROPOFOL  N/A 01/10/2021   Procedure: COLONOSCOPY WITH PROPOFOL ;   Surgeon: Eartha Angelia Toribio, MD;  [facility];  Service: Gastroenterology;  Laterality: N/A;   COLONOSCOPY WITH PROPOFOL  N/A 01/01/2023   Procedure: COLONOSCOPY WITH PROPOFOL ;  Surgeon: Eartha Angelia Toribio, MD;  [facility]: [facility];  Service: Gastroenterology;  Laterality: N/A;  10:15am;asa 2   COSMETIC SURGERY     Skin cancer repair   CYSTOSCOPY W/ URETERAL STENT PLACEMENT Right 06/18/2015   Procedure: CYSTOSCOPY WITH RIGHT RETROGRADE PYELOGRAM RIGHT URETERAL STENT PLACEMENT;  Surgeon: Norleen Seltzer, MD;  Location: [facility];  Service: Urology;  Laterality: Right;   CYSTOSCOPY/URETEROSCOPY/HOLMIUM LASER/STENT PLACEMENT Right 06/27/2015   Procedure: CYSTOSCOPY RIGHT URETEROSCOPY  STENT REMOVAL; STONE EXTRACTION WITH BASKET;  Surgeon: Norleen Seltzer, MD;  Location: [facility];  Service: Urology;  Laterality: Right;   ESOPHAGOGASTRODUODENOSCOPY (EGD) WITH PROPOFOL  N/A 01/10/2021   Procedure: ESOPHAGOGASTRODUODENOSCOPY (EGD) WITH PROPOFOL ;  Surgeon: Eartha Angelia Toribio, MD;  [facility];  Service: Gastroenterology;  Laterality: N/A;  11:00, moved up per Anette Caldron - pt knows arrival time   FRACTURE SURGERY     Several broken bones   HEAD & NECK SKIN LESION EXCISIONAL BIOPSY     HERNIA REPAIR     Inguinal hernia R&L   HOLMIUM LASER APPLICATION Right 06/27/2015  Procedure: HOLMIUM LASER LITHOTRIPSY ;  Surgeon: Norleen Seltzer, MD;  Location: [facility];  Service: Urology;  Laterality: Right;   I & D EXTREMITY Right 07/14/2018   Procedure: IRRIGATION AND DEBRIDEMENT RIGHT KNEE WITH CLOSURE;  Surgeon: Sharl Selinda Dover, MD;  Location: [facility];  Service: Orthopedics;  Laterality: Right;   INGUINAL HERNIA REPAIR Bilateral 10/21/2007   JOINT REPLACEMENT     Right knee   KNEE ARTHROSCOPY W/ ACL RECONSTRUCTION Right 1992   MENISCUS REPAIR Right    2019, dr gerome    POLYPECTOMY  01/10/2021   Procedure: POLYPECTOMY;  Surgeon: Eartha Angelia Sieving, MD;  [facility]: [facility];  Service: Gastroenterology;;   POLYPECTOMY  01/01/2023   Procedure: POLYPECTOMY INTESTINAL;  Surgeon: Eartha Angelia Sieving, MD;  [facility];  Service: Gastroenterology;;   SHOULDER ARTHROSCOPY WITH OPEN ROTATOR CUFF REPAIR Left 07/27/2014   Procedure: LEFT SHOULDER ARTHROSCOPY WITH MINI OPEN ROTATOR CUFF REPAIR;  Surgeon: Dempsey JINNY Sensor, MD;  Location: [facility];  Service: Orthopedics;  Laterality: Left;   SHOULDER ARTHROSCOPY WITH OPEN ROTATOR CUFF REPAIR Right 2006   TONSILLECTOMY AND ADENOIDECTOMY  age 89   TOTAL KNEE ARTHROPLASTY Right 07/10/2018   Procedure: TOTAL KNEE ARTHROPLASTY;  Surgeon: gerome Charleston, MD;  Location: [facility];  Service: Orthopedics;  Laterality: Right;   TRANSTHORACIC ECHOCARDIOGRAM  07/30/2007   normal LVF, ef >55%/  mild AR, MR , PR and TR/  mild AV sclerosis without stenosis/    URETEROSCOPY Right 06/18/2015   Procedure: URETEROSCOPY;  Surgeon: Norleen Seltzer, MD;  Location: [facility];  Service: Urology;  Laterality: Right;    Family History  Problem Relation Age of Onset   Arthritis Mother    Asthma Mother    Diabetes Mother    Early death Mother    Heart disease Mother    Cancer Father    Kidney disease Father    Colon cancer Other        Age of Onset-late 37's   Cancer Maternal Grandfather    Stroke Maternal Grandfather    Heart attack Paternal Grandfather 45 - 99   Heart failure Paternal Grandmother 72 - 29    Social History   Socioeconomic History   Marital status: Married    Spouse name: Not on file   Number of children: Not on file   Years of education: Not on file   Highest education level: Associate degree: occupational, Scientist, product/process development, or vocational program  Occupational History   Not on file  Tobacco Use   Smoking status: Former    Current packs/day: 0.00    Types: Cigarettes, Cigars    Quit date: 10/08/2005    Years since quitting: 18.3    Passive exposure:  Current   Smokeless tobacco: Never   Tobacco comments:    No cigarettes since 2007. Smoke occasion cigar.  Vaping Use   Vaping status: Never Used  Substance and Sexual Activity   Alcohol use: Yes    Comment: Do not drink and alcohol daily. Have a beer or liquor occasi   Drug use: Never   Sexual activity: Not Currently    Birth control/protection: None  Other Topics Concern   Not on file  Social History Narrative   Not on file   Social Drivers of Health   Financial Resource Strain: Low Risk  (02/13/2024)   Overall Financial Resource Strain (CARDIA)    Difficulty of Paying Living Expenses: Not very hard  Food Insecurity: No  Food Insecurity (02/13/2024)   Hunger Vital Sign    Worried About Running Out of Food in the Last Year: Never true    Ran Out of Food in the Last Year: Never true  Transportation Needs: No Transportation Needs (02/13/2024)   PRAPARE - Administrator, Civil Service (Medical): No    Lack of Transportation (Non-Medical): No  Physical Activity: Insufficiently Active (02/13/2024)   Exercise Vital Sign    Days of Exercise per Week: 3 days    Minutes of Exercise per Session: 30 min  Stress: No Stress Concern Present (02/13/2024)   [employer] of Occupational Health - Occupational Stress Questionnaire    Feeling of Stress: Only a little  Social Connections: Moderately Isolated (02/13/2024)   Social Connection and Isolation Panel    Frequency of Communication with Friends and Family: More than three times a week    Frequency of Social Gatherings with Friends and Family: Patient declined    Attends Religious Services: Patient declined    Database administrator or Organizations: No    Attends Engineer, structural: Not on file    Marital Status: Married  Catering manager Violence: Not on file    ROS Review of Systems  Constitutional:  Negative for fatigue and fever.  Eyes:  Negative for visual disturbance.  Respiratory:  Negative for chest  tightness and shortness of breath.   Cardiovascular:  Negative for chest pain and palpitations.  Neurological:  Negative for dizziness and headaches.    Objective:   Today's Vitals: BP 117/73   Pulse (!) 52   Ht 6' 3 (1.905 m)   Wt 218 lb (98.9 kg)   SpO2 93%   BMI 27.25 kg/m   Physical Exam HENT:     Head: Normocephalic.     Right Ear: External ear normal.     Left Ear: External ear normal.     Nose: No congestion or rhinorrhea.     Mouth/Throat:     Mouth: Mucous membranes are moist.  Cardiovascular:     Rate and Rhythm: Regular rhythm.     Heart sounds: No murmur heard. Pulmonary:     Effort: No respiratory distress.     Breath sounds: Normal breath sounds.  Neurological:     Mental Status: He is alert.      Assessment & Plan:   Neuropathy Assessment & Plan: Diagnosed since 2011 Encouraged to continue taking Gabapentin  900 mg 4 times daily   Mixed hyperlipidemia Assessment & Plan: Encouraged to continue taking atorvastatin  40 mg daily  Lifestyle modifications were also discussed, including avoiding simple carbohydrates such as cakes, sweet desserts, ice cream, soda (diet or regular), sweet tea, candies, chips, cookies, store-bought juices, excessive alcohol (more than 1-2 drinks per day), lemonade, artificial sweeteners, donuts, coffee creamers, and sugar-free products. Additionally, the patient was advised to reduce the consumption of greasy, fatty foods and increase physical activity to support cardiovascular health. The patient verbalized understanding and is aware of the plan of care.    Other specified hypothyroidism Assessment & Plan: Encouraged to continue taking synthroid  50 mcg daily The patient is encouraged to follow up if experiencing symptoms of hypothyroidism, despite compliance with the current treatment regimen, such as fatigue, weight gain, constipation, dry skin, cold intolerance, hair loss, or depression.    Encounter for  immunization Assessment & Plan: Patient educated on CDC recommendation for the vaccine. Verbal consent was obtained from the patient, vaccine administered by nurse, no sign of adverse  reactions noted at this time. Patient education on arm soreness and use of tylenol  or ibuprofen for this patient  was discussed. Patient educated on the signs and symptoms of adverse effect and advise to contact the office if they occur.   Orders: -     Flu vaccine HIGH DOSE PF(Fluzone Trivalent)  Encounter for immunization  IFG (impaired fasting glucose) -     Hemoglobin A1c  Vitamin D  deficiency -     VITAMIN D  25 Hydroxy (Vit-D Deficiency, Fractures)  Need for hepatitis C screening test -     Hepatitis C antibody  TSH (thyroid -stimulating hormone deficiency) -     TSH + free T4  Other hyperlipidemia -     Lipid panel -     CMP14+EGFR -     CBC with Differential/Platelet  Note: This chart has been completed using Engineer, civil (consulting) software, and while attempts have been made to ensure accuracy, certain words and phrases may not be transcribed as intended.     Follow-up: Return in about 4 months (around 06/21/2024).   Tyriek Hofman, FNP

## 2024-02-20 NOTE — Patient Instructions (Addendum)
 I appreciate the opportunity to provide care to you today!    Follow up:  4 months  Labs: please stop by the lab today to get your blood drawn (CBC, CMP, TSH, Lipid profile, HgA1c, Vit D)  Screening: Hep C  Schedule medicare annual wellness visit   For a Healthier YOU, I Recommend: Reducing your intake of sugar, sodium, carbohydrates, and saturated fats. Increasing your fiber intake by incorporating more whole grains, fruits, and vegetables into your meals. Setting healthy goals with a focus on lowering your consumption of carbs, sugar, and unhealthy fats. Adding variety to your diet by including a wide range of fruits and vegetables. Cutting back on soda and limiting processed foods as much as possible. Staying active: In addition to taking your weight loss medication, aim for at least 150 minutes of moderate-intensity physical activity each week for optimal results.   Please follow up if your symptoms worsen or fail to improve.    Please continue to a heart-healthy diet and increase your physical activities. Try to exercise for at least five days a week.    It was a pleasure to see you and I look forward to continuing to work together on your health and well-being. Please do not hesitate to call the office if you need care or have questions about your care.  In case of emergency, please visit the Emergency Department for urgent care, or contact our clinic at [phone] to schedule an appointment. We're here to help you!   Have a wonderful day and week. With Gratitude, Khaleelah Yowell MSN, FNP-BC

## 2024-02-20 NOTE — Assessment & Plan Note (Addendum)
 Diagnosed since 2011 Encouraged to continue taking Gabapentin  900 mg 4 times daily

## 2024-02-21 ENCOUNTER — Ambulatory Visit: Payer: Self-pay | Admitting: Family Medicine

## 2024-02-21 LAB — CMP14+EGFR
ALT: 13 IU/L (ref 0–44)
AST: 15 IU/L (ref 0–40)
Albumin: 4.7 g/dL (ref 3.8–4.8)
Alkaline Phosphatase: 93 IU/L (ref 44–121)
BUN/Creatinine Ratio: 9 — ABNORMAL LOW (ref 10–24)
BUN: 13 mg/dL (ref 8–27)
Bilirubin Total: 0.9 mg/dL (ref 0.0–1.2)
CO2: 21 mmol/L (ref 20–29)
Calcium: 10.3 mg/dL — ABNORMAL HIGH (ref 8.6–10.2)
Chloride: 102 mmol/L (ref 96–106)
Creatinine, Ser: 1.47 mg/dL — ABNORMAL HIGH (ref 0.76–1.27)
Globulin, Total: 2.1 g/dL (ref 1.5–4.5)
Glucose: 102 mg/dL — ABNORMAL HIGH (ref 70–99)
Potassium: 4.5 mmol/L (ref 3.5–5.2)
Sodium: 139 mmol/L (ref 134–144)
Total Protein: 6.8 g/dL (ref 6.0–8.5)
eGFR: 50 mL/min/1.73 — ABNORMAL LOW (ref >59)

## 2024-02-21 LAB — CBC WITH DIFFERENTIAL/PLATELET
Basophils Absolute: 0.1 x10E3/uL (ref 0.0–0.2)
Basos: 1 %
EOS (ABSOLUTE): 0.1 x10E3/uL (ref 0.0–0.4)
Eos: 1 %
Hematocrit: 55.1 % — ABNORMAL HIGH (ref 37.5–51.0)
Hemoglobin: 18.1 g/dL — ABNORMAL HIGH (ref 13.0–17.7)
Immature Grans (Abs): 0 x10E3/uL (ref 0.0–0.1)
Immature Granulocytes: 0 %
Lymphocytes Absolute: 1.1 x10E3/uL (ref 0.7–3.1)
Lymphs: 16 %
MCH: 31.9 pg (ref 26.6–33.0)
MCHC: 32.8 g/dL (ref 31.5–35.7)
MCV: 97 fL (ref 79–97)
Monocytes Absolute: 0.6 x10E3/uL (ref 0.1–0.9)
Monocytes: 9 %
Neutrophils Absolute: 5.3 x10E3/uL (ref 1.4–7.0)
Neutrophils: 73 %
Platelets: 196 x10E3/uL (ref 150–450)
RBC: 5.67 x10E6/uL (ref 4.14–5.80)
RDW: 12.8 % (ref 11.6–15.4)
WBC: 7.2 x10E3/uL (ref 3.4–10.8)

## 2024-02-21 LAB — LIPID PANEL
Chol/HDL Ratio: 3.3 ratio (ref 0.0–5.0)
Cholesterol, Total: 171 mg/dL (ref 100–199)
HDL: 52 mg/dL (ref >39)
LDL Chol Calc (NIH): 100 mg/dL — ABNORMAL HIGH (ref 0–99)
Triglycerides: 106 mg/dL (ref 0–149)
VLDL Cholesterol Cal: 19 mg/dL (ref 5–40)

## 2024-02-21 LAB — TSH+FREE T4
Free T4: 1.16 ng/dL (ref 0.82–1.77)
TSH: 1.97 u[IU]/mL (ref 0.450–4.500)

## 2024-02-21 LAB — HEPATITIS C ANTIBODY: Hep C Virus Ab: NONREACTIVE

## 2024-02-21 LAB — HEMOGLOBIN A1C
Est. average glucose Bld gHb Est-mCnc: 114 mg/dL
Hgb A1c MFr Bld: 5.6 % (ref 4.8–5.6)

## 2024-02-21 LAB — VITAMIN D 25 HYDROXY (VIT D DEFICIENCY, FRACTURES): Vit D, 25-Hydroxy: 84.4 ng/mL (ref 30.0–100.0)

## 2024-02-21 NOTE — Progress Notes (Signed)
 Please inform the patient: Your LDL cholesterol level is 100. The goal is to keep it less than 100, so I recommend that you continue your current regimen. Please also work on decreasing your intake of greasy, fatty, and starchy foods while increasing physical activity.  I encourage you to stay well hydrated, aiming for at least 64 ounces of water  daily.  Your hemoglobin and hematocrit levels are slightly elevated, which could be related to dehydration or other factors. We will continue to monitor this.

## 2024-02-27 NOTE — Progress Notes (Deleted)
 Cardiology Office Note:  .   Date:  02/27/2024  ID:  Frank Weber, DOB August 01, 1950, MRN [id number] PCP: [name], MD  [facility] Providers Cardiologist:  [name], MD Electrophysiologist:  Frank FORBES Furbish, MD  }   History of Present Illness: .   Frank Weber is a 73 y.o. male with history of hyperlipidemia, TIA, with CT scan of the head revealing acute or subacute infarct versus artifact in the posterior right frontal lobe.  The MRI of the head revealed no signs of acute stroke.  Hypothyroidism, peripheral neuropathy, migraines, GERD,   He now has an ILR placed by Dr. Furbish on 05/14/2023 (Medtronic LINQ II implantable loop recorder [id number])  he is having remote transmissions.  He is cardiac unaware concerning rapid heart rhythms which were found on ZIO monitor revealing strong burden of SVT.  He has since seen his primary care provider Dr. Bertell who has adjusted his levothyroxine  from 25 mcg to 50 mcg daily.  He comes today without any cardiac complaints of dyspnea, palpitations, or fatigue.  He is medically compliant.  ***  ROS: As above otherwise negative.  Studies Reviewed: .     Echocardiogram 04/30/2023 1. Left ventricular ejection fraction, by estimation, is 60 to 65%. The  left ventricle has normal function. The left ventricle has no regional  wall motion abnormalities. Left ventricular diastolic parameters were  normal.   2. Right ventricular systolic function is normal. The right ventricular  size is normal. There is normal pulmonary artery systolic pressure.   3. Right atrial size was mild to moderately dilated.   4. The mitral valve is normal in structure. No evidence of mitral valve  regurgitation. No evidence of mitral stenosis.   5. The aortic valve is tricuspid. Aortic valve regurgitation is not  visualized. No aortic stenosis is present.   6. Aortic dilatation noted. There is borderline dilatation of the aortic  root, measuring 39 mm.    7. The inferior vena cava is normal in size with greater than 50%  respiratory variability, suggesting right atrial pressure of 3 mmHg.   Zio Monitor 03/18/2023 Patient had a min HR of 38 bpm, max HR of 160 bpm, and avg HR of 58 bpm. Predominant underlying rhythm was Sinus Rhythm. Slight P wave morphology changes were noted. 43 Supraventricular Tachycardia runs occurred, the run with the fastest interval lasting  6 beats with a max rate of 160 bpm, the longest lasting 18.0 secs with an avg rate of 101 bpm. Some episodes of Supraventricular Tachycardia may be possible Atrial Tachycardia with variable block. Isolated SVEs were rare (<1.0%), SVE Couplets were rare  (<1.0%), and SVE Triplets were rare (<1.0%). Isolated VEs were rare (<1.0%), VE Couplets were rare (<1.0%), and no VE Triplets were present. Ventricular Bigeminy was present.  Per Dr. Furbish: my interpretation Sinus rhythm 38 to 133 bpm, average 58 There were 43 episodes labeled SVT --these are most consistent with atrial runs.  The longest was 18 seconds. Overall ectopy burden was less than 1%   Carotid Artery Ultrasound 02/22/2023 IMPRESSION: 1. 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 2. Antegrade bilateral vertebral arterial flow.   NM Stress Test 06/08/2021 The study is normal. The study is low risk.   No ST deviation was noted.   LV perfusion is normal. There is no evidence of ischemia. There is no evidence of infarction.   Left ventricular function is normal. End diastolic cavity size is  normal. End systolic cavity size is normal.    Physical Exam:   VS:  There were no vitals taken for this visit.   Wt Readings from Last 3 Encounters:  02/20/24 218 lb (98.9 kg)  11/06/23 226 lb 11.2 oz (102.8 kg)  09/23/23 227 lb 3.2 oz (103.1 kg)    Affect appropriate Healthy:  appears stated age HEENT: normal Neck supple with no adenopathy JVP normal no bruits no thyromegaly Lungs  clear with no wheezing and good diaphragmatic motion Heart:  S1/S2 SEM  murmur, no rub, gallop or click PMI normal Abdomen: benighn, BS positve, no tenderness, no AAA no bruit.  No HSM or HJR Distal pulses intact with no bruits No edema Neuro non-focal Skin warm and dry No muscular weakness   ASSESSMENT AND PLAN: .    Atrial tachycardia: ILR in situ placed by Dr. Nancey LIPPS reviewed 02/06/24 only ST.  Continue follow-ups with electrophysiology.  He is currently not on any AV nodal blocking agents or antiarrhythmics at this time.  The patient is cardiac unaware.  I have spent time reviewing his echocardiogram results and answered questions concerning his LVEF, and normal valve function.Not on beta blocker due to low resting HR   2.  Hypothyroidism: The patient has recently had his medications adjusted to increase levothyroxine  from 25 mcg daily to 50 mcg daily by Dr. Bertell his primary care provider. TSH normal 1.9 02/20/24   3.  Hypercholesterolemia: Remains on atorvastatin  20 mg daily.  Labs are followed by primary care. LDL 100   4.  History of TIA: He remains on statin therapy and aspirin .  He has no residual effects or memory loss.  5. Murmur:  AV sclerosis murmur echo benign 04/30/23  6. CAD:  normal cath 2016, normal myovue 06/08/21. Cardiac CTA 12/23/19 with calcium  scor 227 , 64 th percentile moderate mid LAD stenosis FFR CT negative Continue statin / ASA      F/U Dr Nancey in 6 months      [name] MD [facility]

## 2024-03-08 ENCOUNTER — Ambulatory Visit

## 2024-03-08 DIAGNOSIS — I495 Sick sinus syndrome: Secondary | ICD-10-CM

## 2024-03-08 LAB — CUP PACEART REMOTE DEVICE CHECK
Date Time Interrogation Session: 20250928234655
Implantable Pulse Generator Implant Date: 20241204

## 2024-03-10 ENCOUNTER — Ambulatory Visit: Attending: Student | Admitting: Student

## 2024-03-10 ENCOUNTER — Encounter: Payer: Self-pay | Admitting: Student

## 2024-03-10 DIAGNOSIS — I2510 Atherosclerotic heart disease of native coronary artery without angina pectoris: Secondary | ICD-10-CM | POA: Insufficient documentation

## 2024-03-10 DIAGNOSIS — E785 Hyperlipidemia, unspecified: Secondary | ICD-10-CM | POA: Diagnosis not present

## 2024-03-10 DIAGNOSIS — I4719 Other supraventricular tachycardia: Secondary | ICD-10-CM | POA: Insufficient documentation

## 2024-03-10 MED ORDER — ATORVASTATIN CALCIUM 40 MG PO TABS: 40.0000 mg | ORAL_TABLET | Freq: Every day | ORAL | 3 refills | Status: DC

## 2024-03-10 NOTE — Patient Instructions (Signed)
 Medication Instructions:  Your physician has recommended you make the following change in your medication:   Increase Atorvastatin  to 40 mg Daily    *If you need a refill on your cardiac medications before your next appointment, please call your pharmacy*  Lab Work: Your physician recommends that you return for lab work in: January ( LFT, FLP)   If you have labs (blood work) drawn today and your tests are completely normal, you will receive your results only by: [employer] (if you have MyChart) OR A paper copy in the mail If you have any lab test that is abnormal or we need to change your treatment, we will call you to review the results.  Testing/Procedures: NONE   Follow-Up: At [facility], you and your health needs are our priority.  As part of our continuing mission to provide you with exceptional heart care, our providers are all part of one team.  This team includes your primary Cardiologist (physician) and Advanced Practice Providers or APPs (Physician Assistants and Nurse Practitioners) who all work together to provide you with the care you need, when you need it.  Your next appointment:   1 year(s)  Provider:   You may see [name], MD or one of the following Advanced Practice Providers on your designated Care Team:   Laymon Qua, PA-C  [place], [place] Olivia [place], [place]     We recommend signing up for the patient portal called MyChart.  Sign up information is provided on this After Visit Summary.  MyChart is used to connect with patients for Virtual Visits (Telemedicine).  Patients are able to view lab/test results, encounter notes, upcoming appointments, etc.  Non-urgent messages can be sent to your provider as well.   To learn more about what you can do with MyChart, go to [email or web address].   Other Instructions Thank you for choosing [facility]!

## 2024-03-10 NOTE — Progress Notes (Signed)
 Cardiology Office Note    Date:  03/11/2024  ID:  Frank Weber, DOB 03-18-1951, MRN [id number] Cardiologist: [name], MD Electrophysiologist:  [name], MD { :  History of Present Illness:    Frank Weber is a 73 y.o. male with past medical history of CAD (s/p cath in 2009 showing no significant CAD, Coronary CT in 12/2019 showing moderate LAD stenosis but not significant by FFR, low-risk NST in 05/2021), atrial tachycardia, prior TIA (ILR in place), HLD and Hypothyroidism who presents to the office today for 3-month follow-up.  He was last examined by Lamarr Satterfield, NP in 06/2023 and had recently undergone ILR placement. He denied any recent chest pain or dyspnea on exertion at the time of his visit. While his ILR had shown episodes of atrial tachycardia, he was not on AV nodal blocking agents given baseline bradycardia. He was continued on ASA 81 mg daily and Atorvastatin  20 mg daily.  In talking with the patient today, he reports overall doing well since his last office visit. He experiences intermittent dyspnea but no progression of symptoms. Does note occasional episodes of chest pain which occur 1-2 times per month and typically occur with exertion and resolve with rest. Reports this sometimes feels like a shooting pain which only lasts for a few seconds and then resolves. Symptoms have been occurring for several months with no increase in frequency or severity. No recent orthopnea, PND or pitting edema  In reviewing his medication list, he has only been taking Atorvastatin  20 mg daily.  Studies Reviewed:   EKG: EKG is ordered today and demonstrates:   EKG Interpretation Date/Time:  Wednesday March 10 2024 14:32:55 EDT Ventricular Rate:  51 PR Interval:  158 QRS Duration:  86 QT Interval:  454 QTC Calculation: 418 R Axis:   45  Text Interpretation: Sinus bradycardia Confirmed by Johnson Grate ([id number]) on 03/10/2024 2:36:09 PM       NST: 05/2021   The  study is normal. The study is low risk.   No ST deviation was noted.   LV perfusion is normal. There is no evidence of ischemia. There is no evidence of infarction.   Left ventricular function is normal. End diastolic cavity size is normal. End systolic cavity size is normal.   Diaphragmatic attenuation and motion artifact No ischemia/infarction EF 54%   Echocardiogram: 04/2023 IMPRESSIONS     1. Left ventricular ejection fraction, by estimation, is 60 to 65%. The  left ventricle has normal function. The left ventricle has no regional  wall motion abnormalities. Left ventricular diastolic parameters were  normal.   2. Right ventricular systolic function is normal. The right ventricular  size is normal. There is normal pulmonary artery systolic pressure.   3. Right atrial size was mild to moderately dilated.   4. The mitral valve is normal in structure. No evidence of mitral valve  regurgitation. No evidence of mitral stenosis.   5. The aortic valve is tricuspid. Aortic valve regurgitation is not  visualized. No aortic stenosis is present.   6. Aortic dilatation noted. There is borderline dilatation of the aortic  root, measuring 39 mm.   7. The inferior vena cava is normal in size with greater than 50%  respiratory variability, suggesting right atrial pressure of 3 mmHg.   Comparison(s): No significant change from prior study.   Physical Exam:   VS:  BP 124/78 (BP Location: Right Arm, Cuff Size: Normal)   Pulse (!) 44   Ht  6' 3 (1.905 m)   Wt 218 lb (98.9 kg)   SpO2 96%   BMI 27.25 kg/m    Wt Readings from Last 3 Encounters:  03/10/24 218 lb (98.9 kg)  02/20/24 218 lb (98.9 kg)  11/06/23 226 lb 11.2 oz (102.8 kg)     GEN: Well nourished, well developed male appearing in no acute distress NECK: No JVD; No carotid bruits CARDIAC: Regular rhythm, bradycardic rate. no murmurs, rubs, gallops RESPIRATORY:  Clear to auscultation without rales, wheezing or rhonchi  ABDOMEN:  Appears non-distended. No obvious abdominal masses. EXTREMITIES: No clubbing or cyanosis. No pitting edema.  Distal pedal pulses are 2+ bilaterally.   Assessment and Plan:   1. Coronary artery disease involving native coronary artery of native heart - Prior Coronary CTA in 12/2019 showed a coronary calcium  score of 227 and he did have calcified and noncalcified plaque along the mid LAD leading to moderate stenosis but not significant by FFR. NST in 05/2021 was low-risk.  - He does report occasional episodes of chest pain as outlined above and his exertional symptoms are more concerning for angina but his episodes that only last for a few seconds seem atypical. EKG today shows known bradycardia but no acute ST changes. We reviewed options for ischemic evaluation including a repeat Coronary CT or NST and he prefers to hold off on this for now given his infrequent symptoms. I encouraged him to make us  aware of any change in symptoms as we could arrange for testing prior to his next office visit. - Continue ASA 81 mg daily and will titrate Atorvastatin  as discussed below.  2. Atrial tachycardia - He denies any specific palpitations and is in sinus bradycardia today. Recent ILR interrogation showed only brief episodes of atrial tachycardia lasting 2 to 4 minutes and no persistent symptoms. He has not been on AV nodal blocking agents given baseline bradycardia and will continue to avoid given his heart rate in the 40's to 50's today.  3. Hyperlipidemia LDL goal <70 - His LDL was previously 81 in 02/2023 but elevated to 100 when checked in 02/2024. He is currently taking Atorvastatin  20 mg daily and will titrate to 40 mg daily. Recheck FLP and LFT's in 2 to 3 months.   Signed, Laymon CHRISTELLA Qua, PA-C

## 2024-03-10 NOTE — Progress Notes (Signed)
 Remote Loop Recorder Transmission

## 2024-03-11 NOTE — Progress Notes (Signed)
 Remote Loop Recorder Transmission

## 2024-03-12 ENCOUNTER — Encounter (INDEPENDENT_AMBULATORY_CARE_PROVIDER_SITE_OTHER): Payer: Self-pay | Admitting: Gastroenterology

## 2024-03-12 ENCOUNTER — Ambulatory Visit: Admitting: Cardiovascular Disease

## 2024-03-16 ENCOUNTER — Ambulatory Visit: Payer: Self-pay | Admitting: Cardiovascular Disease

## 2024-03-24 ENCOUNTER — Encounter (INDEPENDENT_AMBULATORY_CARE_PROVIDER_SITE_OTHER): Payer: Self-pay | Admitting: Gastroenterology

## 2024-03-29 ENCOUNTER — Other Ambulatory Visit (INDEPENDENT_AMBULATORY_CARE_PROVIDER_SITE_OTHER): Payer: Self-pay | Admitting: Gastroenterology

## 2024-03-29 ENCOUNTER — Telehealth (INDEPENDENT_AMBULATORY_CARE_PROVIDER_SITE_OTHER): Payer: Self-pay | Admitting: Gastroenterology

## 2024-03-29 ENCOUNTER — Ambulatory Visit (INDEPENDENT_AMBULATORY_CARE_PROVIDER_SITE_OTHER): Admitting: Gastroenterology

## 2024-03-29 ENCOUNTER — Encounter (INDEPENDENT_AMBULATORY_CARE_PROVIDER_SITE_OTHER): Payer: Self-pay | Admitting: Gastroenterology

## 2024-03-29 VITALS — BP 97/57 | HR 52 | Temp 98.2°F | Ht 75.0 in | Wt 216.1 lb

## 2024-03-29 DIAGNOSIS — Z79899 Other long term (current) drug therapy: Secondary | ICD-10-CM | POA: Diagnosis not present

## 2024-03-29 DIAGNOSIS — K582 Mixed irritable bowel syndrome: Secondary | ICD-10-CM | POA: Diagnosis not present

## 2024-03-29 NOTE — Patient Instructions (Addendum)
 Increase water  intake, aim for atleast 64 oz per day Increase fruits, veggies and whole grains, kiwi and prunes are especially good for constipation Continue linzess  145mcg daily for now, I will try to see if any other constipation meds are covered by your insurance and let you know what I find  Follow up 6 months

## 2024-03-29 NOTE — Telephone Encounter (Signed)
 See note from OV on 03/29/24.

## 2024-03-29 NOTE — Progress Notes (Addendum)
 Referring Provider: [name], MD Primary Care Physician:  [name], MD Primary GI Physician: Dr. Eartha   Chief Complaint  Patient presents with   Follow-up    Pt arrives for follow up. Pt states things are about the same, no worsening symptoms.     HPI:   Frank Weber is a 73 y.o. male with past medical history of  GERD, hypothyroidism, HLD, peripheral neuropathy, and IBS   Patient presenting today for:  Follow up of IBS-M  Last seen may, at that time feeling better with antibiotic course, pain mostly resolved. Continues to have mix of constipation/diarrhea on linzess . Did not wish to switch to other agent from linzess .   Recommended continue linzess  145mcg daily, consider amitiza /ibsrela, high fiber diet, good water  intake  Present:  Doing about the same today. Taking linzess  daily with mix of constipation and diarrhea. Skips a dose if traveling as stooling is unpredictable, some occasional abdominal discomfort but feels this is his chronic type discomfort. No rectal bleeding or melena. Nausea has improved. Appetite is good, weight stable. Is possibly interested in seeing if other constipation meds are covered under his insurance but worried they will not be affordable.    GES: 04/2022 normal  MR brain wo contrast 02/2023 negative CT chest abdomen pelvis with contrast 02/27/2022: -No acute findings within the chest, abdomen, or pelvis -Small nonspecific pulmonary nodules present, no need for follow-up imaging if low risk -Moderate stool burden within the colon -Sigmoid diverticulosis without diverticulitis Last EGD 01/2021 - Salmon-colored mucosa suspicious for short-segment Barrett's esophagus. Biopsied - reflux changes. - Erythematous mucosa in the antrum. Biopsied - reactive gastropathy neg for HP. - Normal examined duodenum. Last Colonoscopy:12/2022  - One 4 mm polyp in the transverse colon, removed                            with a cold snare. Resected  and retrieved.                           - Four 2 to 6 mm polyps in the sigmoid colon and in                            the descending colon, removed with a cold snare.                            Resected and retrieved.                           - Diverticulosis in the sigmoid colon and in the                            descending colon.                           - Non-bleeding internal hemorrhoids. Pathology with hyperplastic polyp in descending and sigmoid colon, negative for dysplasia or carcinoma, tubular adenoma in transverse, negative for high-grade dysplasia or carcinoma   Repeat TCS July 2029   [facility] Weights   03/29/24 0909  Weight: 216 lb 1.6 oz (98 kg)     Past Medical History:  Diagnosis Date   Allergy    Sulfa drugs  Arthritis    right knee   Cancer (HCC)    Skin   Constipation    Frequency of urination    GERD (gastroesophageal reflux disease)    Heart murmur    slight    History of kidney stones    History of melanoma excision    2012--  NECK/ HEAD   History of squamous cell carcinoma excision    RIGHT EAR   Hyperlipidemia    Hypothyroidism    IBS (irritable bowel syndrome)    states slight   Lumbar stenosis    L5 - S1   Migraines    Peripheral neuropathy    legs and feet   Renal insufficiency    Right flank pain    Right ureteral stone    Urgency of urination    Wears glasses     Past Surgical History:  Procedure Laterality Date   BIOPSY  01/10/2021   Procedure: BIOPSY;  Surgeon: Eartha Angelia Sieving, MD;  [facility]: [facility];  Service: Gastroenterology;;   CARDIAC CATHETERIZATION  07-08-2007  dr herminio    no significant CAD, preserved LVF, ef 50%//  30% pLAD   CARDIOVASCULAR STRESS TEST  06-20-2011  dr croitoru   Low risk study/  normal perfusion scan showing attenuation artifact in the anterior region of myocardium with no ischemia , infarct or scar/  normal LV funciton and wall motion , ef 62%   COLONOSCOPY N/A 02/17/2013    Rehman:examination performed to cecum. mild sigmoid colon diverticulosis, 4 small polyps ablated via cold biopsy from rectosigmoid junction (hyperplastic). External hemorrhoids.   COLONOSCOPY WITH PROPOFOL  N/A 01/10/2021   Procedure: COLONOSCOPY WITH PROPOFOL ;  Surgeon: Eartha Angelia Sieving, MD;  [facility]: [facility];  Service: Gastroenterology;  Laterality: N/A;   COLONOSCOPY WITH PROPOFOL  N/A 01/01/2023   Procedure: COLONOSCOPY WITH PROPOFOL ;  Surgeon: Eartha Angelia Sieving, MD;  [facility]: [facility];  Service: Gastroenterology;  Laterality: N/A;  10:15am;asa 2   COSMETIC SURGERY     Skin cancer repair   CYSTOSCOPY W/ URETERAL STENT PLACEMENT Right 06/18/2015   Procedure: CYSTOSCOPY WITH RIGHT RETROGRADE PYELOGRAM RIGHT URETERAL STENT PLACEMENT;  Surgeon: Norleen Seltzer, MD;  Location: [facility];  Service: Urology;  Laterality: Right;   CYSTOSCOPY/URETEROSCOPY/HOLMIUM LASER/STENT PLACEMENT Right 06/27/2015   Procedure: CYSTOSCOPY RIGHT URETEROSCOPY  STENT REMOVAL; STONE EXTRACTION WITH BASKET;  Surgeon: Norleen Seltzer, MD;  Location: [facility];  Service: Urology;  Laterality: Right;   ESOPHAGOGASTRODUODENOSCOPY (EGD) WITH PROPOFOL  N/A 01/10/2021   Procedure: ESOPHAGOGASTRODUODENOSCOPY (EGD) WITH PROPOFOL ;  Surgeon: Eartha Angelia Sieving, MD;  [facility];  Service: Gastroenterology;  Laterality: N/A;  11:00, moved up per Anette Caldron - pt knows arrival time   FRACTURE SURGERY     Several broken bones   HEAD & NECK SKIN LESION EXCISIONAL BIOPSY     HERNIA REPAIR     Inguinal hernia R&L   HOLMIUM LASER APPLICATION Right 06/27/2015   Procedure: HOLMIUM LASER LITHOTRIPSY ;  Surgeon: Norleen Seltzer, MD;  Location: [facility];  Service: Urology;  Laterality: Right;   I & D EXTREMITY Right 07/14/2018   Procedure: IRRIGATION AND DEBRIDEMENT RIGHT KNEE WITH CLOSURE;  Surgeon: Sharl Selinda Dover, MD;  Location: [facility];  Service: Orthopedics;  Laterality:  Right;   INGUINAL HERNIA REPAIR Bilateral 10/21/2007   JOINT REPLACEMENT     Right knee   KNEE ARTHROSCOPY W/ ACL RECONSTRUCTION Right 1992   MENISCUS REPAIR Right    2019, dr gerome  POLYPECTOMY  01/10/2021   Procedure: POLYPECTOMY;  Surgeon: Eartha Angelia Sieving, MD;  [facility];  Service: Gastroenterology;;   POLYPECTOMY  01/01/2023   Procedure: POLYPECTOMY INTESTINAL;  Surgeon: Eartha Angelia Sieving, MD;  [facility];  Service: Gastroenterology;;   SHOULDER ARTHROSCOPY WITH OPEN ROTATOR CUFF REPAIR Left 07/27/2014   Procedure: LEFT SHOULDER ARTHROSCOPY WITH MINI OPEN ROTATOR CUFF REPAIR;  Surgeon: Dempsey JINNY Sensor, MD;  Location: [facility];  Service: Orthopedics;  Laterality: Left;   SHOULDER ARTHROSCOPY WITH OPEN ROTATOR CUFF REPAIR Right 2006   TONSILLECTOMY AND ADENOIDECTOMY  age 32   TOTAL KNEE ARTHROPLASTY Right 07/10/2018   Procedure: TOTAL KNEE ARTHROPLASTY;  Surgeon: Gerome Charleston, MD;  Location: [facility];  Service: Orthopedics;  Laterality: Right;   TRANSTHORACIC ECHOCARDIOGRAM  07/30/2007   normal LVF, ef >55%/  mild AR, MR , PR and TR/  mild AV sclerosis without stenosis/    URETEROSCOPY Right 06/18/2015   Procedure: URETEROSCOPY;  Surgeon: Norleen Seltzer, MD;  Location: [facility];  Service: Urology;  Laterality: Right;    Current Outpatient Medications  Medication Sig Dispense Refill   aspirin  EC 81 MG tablet Take 1 tablet (81 mg total) by mouth daily. Swallow whole. 90 tablet 3   atorvastatin  (LIPITOR) 40 MG tablet Take 1 tablet (40 mg total) by mouth daily. 90 tablet 3   Cholecalciferol  (DIALYVITE VITAMIN D  5000) 125 MCG (5000 UT) capsule Take 5,000 Units by mouth daily.     dicyclomine  (BENTYL ) 10 MG capsule Take 1 capsule (10 mg total) by mouth 3 (three) times daily as needed for spasms. 60 capsule 1   gabapentin  (NEURONTIN ) 300 MG capsule Take 900 mg by mouth 4 (four) times daily.     levothyroxine  (SYNTHROID ) 50 MCG tablet  Take 50 mcg by mouth daily.     linaclotide  (LINZESS ) 145 MCG CAPS capsule Take 145 mcg by mouth daily before breakfast.     mirtazapine  (REMERON ) 15 MG tablet TAKE 1 TABLET BY MOUTH DAILY AT BEDTIME 90 tablet 1   nitroGLYCERIN  (NITROSTAT ) 0.4 MG SL tablet Place 1 tablet (0.4 mg total) under the tongue every 5 (five) minutes as needed. 25 tablet 3   ondansetron  (ZOFRAN ) 4 MG tablet Take 1 tablet (4 mg total) by mouth every 8 (eight) hours as needed for nausea or vomiting. 20 tablet 1   Polyethyl Glycol-Propyl Glycol (SYSTANE ULTRA) 0.4-0.3 % SOLN Place 2 drops into both eyes as needed (for dry eyes).      Probiotic Product (TRUBIOTICS PO) Take 1 capsule by mouth at bedtime.     rizatriptan  (MAXALT -MLT) 10 MG disintegrating tablet Take 10 mg by mouth as needed (for migraines and may repeat once in 2 hours, if no relief).      triamcinolone  cream (KENALOG ) 0.1 % Apply 1 Application topically as needed.     No current facility-administered medications for this visit.    Allergies as of 03/29/2024 - Review Complete 03/29/2024  Allergen Reaction Noted   Sulfa antibiotics Anaphylaxis, Shortness Of Breath, and Rash 07/20/2014   Coconut (cocos nucifera) Rash 07/20/2014    Social History   Socioeconomic History   Marital status: Married    Spouse name: Not on file   Number of children: Not on file   Years of education: Not on file   Highest education level: Associate degree: occupational, Scientist, product/process development, or vocational program  Occupational History   Not on file  Tobacco Use   Smoking status: Former  Current packs/day: 0.00    Types: Cigars, Cigarettes    Start date: 06/1975    Quit date: 10/08/2005    Years since quitting: 18.4    Passive exposure: Current   Smokeless tobacco: Never   Tobacco comments:    No cigarettes since 2007. Smoke occasion cigar.  Vaping Use   Vaping status: Never Used  Substance and Sexual Activity   Alcohol use: Not Currently    Comment: Do not drink and alcohol  daily. Have a beer or liquor occasi   Drug use: Never   Sexual activity: Not Currently    Birth control/protection: None  Other Topics Concern   Not on file  Social History Narrative   Not on file   Social Drivers of Health   Financial Resource Strain: Low Risk  (02/13/2024)   Overall Financial Resource Strain (CARDIA)    Difficulty of Paying Living Expenses: Not very hard  Food Insecurity: No Food Insecurity (02/13/2024)   Hunger Vital Sign    Worried About Running Out of Food in the Last Year: Never true    Ran Out of Food in the Last Year: Never true  Transportation Needs: No Transportation Needs (02/13/2024)   PRAPARE - Administrator, Civil Service (Medical): No    Lack of Transportation (Non-Medical): No  Physical Activity: Insufficiently Active (02/13/2024)   Exercise Vital Sign    Days of Exercise per Week: 3 days    Minutes of Exercise per Session: 30 min  Stress: No Stress Concern Present (02/13/2024)   [employer] of Occupational Health - Occupational Stress Questionnaire    Feeling of Stress: Only a little  Social Connections: Moderately Isolated (02/13/2024)   Social Connection and Isolation Panel    Frequency of Communication with Friends and Family: More than three times a week    Frequency of Social Gatherings with Friends and Family: Patient declined    Attends Religious Services: Patient declined    Database administrator or Organizations: No    Attends Engineer, structural: Not on file    Marital Status: Married    Review of systems General: negative for malaise, night sweats, fever, chills, weight loss Neck: Negative for lumps, goiter, pain and significant neck swelling Resp: Negative for cough, wheezing, dyspnea at rest CV: Negative for chest pain, leg swelling, palpitations, orthopnea GI: denies melena, hematochezia, nausea, vomiting, dysphagia, odyonophagia, early satiety or unintentional weight loss. +diarrhea +Constipation +occasional  abdominal discomfort  MSK: Negative for joint pain or swelling, back pain, and muscle pain. Derm: Negative for itching or rash Psych: Denies depression, anxiety, memory loss, confusion. No homicidal or suicidal ideation.  Heme: Negative for prolonged bleeding, bruising easily, and swollen nodes. Endocrine: Negative for cold or heat intolerance, polyuria, polydipsia and goiter. Neuro: negative for tremor, gait imbalance, syncope and seizures. The remainder of the review of systems is noncontributory.  Physical Exam: BP (!) 97/57   Pulse (!) 52   Temp 98.2 F (36.8 C)   Ht 6' 3 (1.905 m)   Wt 216 lb 1.6 oz (98 kg)   BMI 27.01 kg/m  General:   Alert and oriented. No distress noted. Pleasant and cooperative.  Head:  Normocephalic and atraumatic. Eyes:  Conjuctiva clear without scleral icterus. Mouth:  Oral mucosa pink and moist. Good dentition. No lesions. Heart: Normal rate and rhythm, s1 and s2 heart sounds present.  Lungs: Clear lung sounds in all lobes. Respirations equal and unlabored. Abdomen:  +BS, soft, non-tender and  non-distended. No rebound or guarding. No HSM or masses noted. Derm: No palmar erythema or jaundice Msk:  Symmetrical without gross deformities. Normal posture. Extremities:  Without edema. Neurologic:  Alert and  oriented x4 Psych:  Alert and cooperative. Normal mood and affect.  Invalid input(s): 6 MONTHS   ASSESSMENT: Frank Weber is a 73 y.o. male presenting today for follow up of IBS-M  IBS-M: continues to have mix of constipation and diarrhea on linzess  with very unpredictable bowel habits that affect his daily life. We have discussed switching to different therapy in the past which he was hesitant to do, he is still unsure if he wishes to change therapy due to concern about cost. I would be hesitant to change the dose of his linzess  for fear of either worsening diarrhea with higher dose or risking worsening constipation with lower as he can  sometimes have up to 5 watery stools per day currently. Patient has another appointment after this today. I advised him I would see if I could tell what other agents may be covered and send him a mychart message to see if he is interested in trying one of these. Appears amitiza  is covered and [employer] offers patient assistance through their company.    PLAN:  -Increase water  intake, aim for atleast 64 oz per day -Increase fruits, veggies and whole grains, kiwi and prunes are especially good for constipation -continue linzess  145mcg daily for now -continue remeron  15mg  at bedtime -patient to consider amitiza  8mcg BID or ibsrela in place of linzess    All questions were answered, patient verbalized understanding and is in agreement with plan as outlined above.   Follow Up: 6 months   Sayde Lish L. Mariette, MSN, APRN, AGNP-C Adult-Gerontology Nurse Practitioner [facility]  I have reviewed the note and agree with the APP's assessment as described in this progress note  Toribio Fortune, MD Gastroenterology and Hepatology [facility] Gastroenterology

## 2024-04-08 ENCOUNTER — Other Ambulatory Visit (INDEPENDENT_AMBULATORY_CARE_PROVIDER_SITE_OTHER): Payer: Self-pay | Admitting: Gastroenterology

## 2024-04-08 ENCOUNTER — Ambulatory Visit (INDEPENDENT_AMBULATORY_CARE_PROVIDER_SITE_OTHER)

## 2024-04-08 DIAGNOSIS — I495 Sick sinus syndrome: Secondary | ICD-10-CM | POA: Diagnosis not present

## 2024-04-08 DIAGNOSIS — R110 Nausea: Secondary | ICD-10-CM

## 2024-04-08 LAB — CUP PACEART REMOTE DEVICE CHECK
Date Time Interrogation Session: 20251029234208
Implantable Pulse Generator Implant Date: 20241204

## 2024-04-08 NOTE — Progress Notes (Signed)
 Remote Loop Recorder Transmission

## 2024-04-13 ENCOUNTER — Encounter (INDEPENDENT_AMBULATORY_CARE_PROVIDER_SITE_OTHER): Payer: Self-pay

## 2024-04-13 ENCOUNTER — Telehealth (INDEPENDENT_AMBULATORY_CARE_PROVIDER_SITE_OTHER): Payer: Self-pay | Admitting: Gastroenterology

## 2024-04-13 NOTE — Telephone Encounter (Signed)
 Received patient assistance form. Filled out nurses part, will place on provider desk for signature.

## 2024-04-13 NOTE — Telephone Encounter (Signed)
 My chart message sent to pt making him aware that form was filled out, signed and fax.

## 2024-04-13 NOTE — Telephone Encounter (Signed)
 Form faxed and placed in scan box to be scanned in pt chart

## 2024-04-15 ENCOUNTER — Ambulatory Visit: Payer: Self-pay | Admitting: Cardiovascular Disease

## 2024-04-16 ENCOUNTER — Encounter: Admitting: Cardiovascular Disease

## 2024-04-16 DIAGNOSIS — M545 Low back pain, unspecified: Secondary | ICD-10-CM | POA: Diagnosis not present

## 2024-04-20 ENCOUNTER — Telehealth (INDEPENDENT_AMBULATORY_CARE_PROVIDER_SITE_OTHER): Payer: Self-pay

## 2024-04-20 NOTE — Telephone Encounter (Signed)
 Letter from [employer] stating patient is approved for patient assistance for Linzess , through [employer] from 04/19/2024-06/09/2025. Copy of the letter mailed to the patient, and given to [facility], to Scan to the patient chart.

## 2024-04-27 ENCOUNTER — Other Ambulatory Visit: Payer: Self-pay

## 2024-04-27 ENCOUNTER — Emergency Department (HOSPITAL_COMMUNITY)
Admission: EM | Admit: 2024-04-27 | Discharge: 2024-04-27 | Disposition: A | Discharge status: Home / Self Care | Attending: Emergency Medicine | Admitting: Emergency Medicine

## 2024-04-27 ENCOUNTER — Emergency Department (HOSPITAL_COMMUNITY)

## 2024-04-27 ENCOUNTER — Encounter (HOSPITAL_COMMUNITY): Payer: Self-pay | Admitting: Emergency Medicine

## 2024-04-27 DIAGNOSIS — Z7982 Long term (current) use of aspirin: Secondary | ICD-10-CM | POA: Diagnosis not present

## 2024-04-27 DIAGNOSIS — M7989 Other specified soft tissue disorders: Secondary | ICD-10-CM | POA: Diagnosis not present

## 2024-04-27 DIAGNOSIS — L089 Local infection of the skin and subcutaneous tissue, unspecified: Secondary | ICD-10-CM | POA: Diagnosis not present

## 2024-04-27 DIAGNOSIS — L02414 Cutaneous abscess of left upper limb: Secondary | ICD-10-CM | POA: Diagnosis not present

## 2024-04-27 DIAGNOSIS — R9431 Abnormal electrocardiogram [ECG] [EKG]: Secondary | ICD-10-CM | POA: Diagnosis not present

## 2024-04-27 DIAGNOSIS — L02512 Cutaneous abscess of left hand: Secondary | ICD-10-CM | POA: Diagnosis not present

## 2024-04-27 DIAGNOSIS — M1812 Unilateral primary osteoarthritis of first carpometacarpal joint, left hand: Secondary | ICD-10-CM | POA: Diagnosis not present

## 2024-04-27 LAB — CBC WITH DIFFERENTIAL/PLATELET
Abs Immature Granulocytes: 0.02 K/uL (ref 0.00–0.07)
Basophils Absolute: 0 K/uL (ref 0.0–0.1)
Basophils Relative: 1 %
Eosinophils Absolute: 0.1 K/uL (ref 0.0–0.5)
Eosinophils Relative: 2 %
HCT: 51.1 % (ref 39.0–52.0)
Hemoglobin: 16.8 g/dL (ref 13.0–17.0)
Immature Granulocytes: 0 %
Lymphocytes Relative: 20 %
Lymphs Abs: 1.4 K/uL (ref 0.7–4.0)
MCH: 31.9 pg (ref 26.0–34.0)
MCHC: 32.9 g/dL (ref 30.0–36.0)
MCV: 97.1 fL (ref 80.0–100.0)
Monocytes Absolute: 0.7 K/uL (ref 0.1–1.0)
Monocytes Relative: 10 %
Neutro Abs: 4.5 K/uL (ref 1.7–7.7)
Neutrophils Relative %: 67 %
Platelets: 183 K/uL (ref 150–400)
RBC: 5.26 MIL/uL (ref 4.22–5.81)
RDW: 13.2 % (ref 11.5–15.5)
WBC: 6.7 K/uL (ref 4.0–10.5)
nRBC: 0 % (ref 0.0–0.2)

## 2024-04-27 LAB — COMPREHENSIVE METABOLIC PANEL WITH GFR
ALT: 19 U/L (ref 0–44)
AST: 20 U/L (ref 15–41)
Albumin: 4.3 g/dL (ref 3.5–5.0)
Alkaline Phosphatase: 93 U/L (ref 38–126)
Anion gap: 9 (ref 5–15)
BUN: 13 mg/dL (ref 8–23)
CO2: 27 mmol/L (ref 22–32)
Calcium: 9.6 mg/dL (ref 8.9–10.3)
Chloride: 105 mmol/L (ref 98–111)
Creatinine, Ser: 1.32 mg/dL — ABNORMAL HIGH (ref 0.61–1.24)
GFR, Estimated: 57 mL/min — ABNORMAL LOW (ref >60)
Glucose, Bld: 102 mg/dL — ABNORMAL HIGH (ref 70–99)
Potassium: 4.7 mmol/L (ref 3.5–5.1)
Sodium: 141 mmol/L (ref 135–145)
Total Bilirubin: 0.6 mg/dL (ref 0.0–1.2)
Total Protein: 6.4 g/dL — ABNORMAL LOW (ref 6.5–8.1)

## 2024-04-27 MED ORDER — DOXYCYCLINE HYCLATE 100 MG PO CAPS: 100.0000 mg | ORAL_CAPSULE | Freq: Two times a day (BID) | ORAL | 0 refills | Status: AC

## 2024-04-27 MED ORDER — LIDOCAINE HCL (PF) 1 % IJ SOLN
5.0000 mL | Freq: Once | INTRAMUSCULAR | Status: AC
  Administered 2024-04-27: 5 mL via INTRADERMAL
  Filled 2024-04-27: qty 5

## 2024-04-27 MED ORDER — DOXYCYCLINE HYCLATE 100 MG PO TABS
100.0000 mg | ORAL_TABLET | Freq: Once | ORAL | Status: AC
  Administered 2024-04-27: 100 mg via ORAL
  Filled 2024-04-27: qty 1

## 2024-04-27 NOTE — ED Provider Notes (Signed)
  EMERGENCY DEPARTMENT AT [facility] Provider Note   CSN: [id number] Arrival date & time: 04/27/24  9344     Patient presents with: Hand Injury   Frank Weber is a 73 y.o. male.   HPI Patient presents for left hand injury.  Medical history includes migraines, GERD, IBS, neuropathy, HLD, TIA, nephrolithiasis.  2 weeks ago, he was blowing leaves on his back deck.  He inadvertently struck a barbecue tongue which pierced the dorsal aspect of his medial left hand.  He placed some Steri-Strips over the wound.  It seemed to be doing fine up until 2 days ago.  Over the past 2 days, he has developed increased swelling, pain, and purulent drainage.  Pain extends from fourth MCP to medial wrist.  He denies any systemic symptoms.    Prior to Admission medications   Medication Sig Start Date End Date Taking? Authorizing Provider  doxycycline (VIBRAMYCIN) 100 MG capsule Take 1 capsule (100 mg total) by mouth 2 (two) times daily for 7 days. 04/27/24 05/04/24 Yes Melvenia Motto, MD  aspirin  EC 81 MG tablet Take 1 tablet (81 mg total) by mouth daily. Swallow whole. 12/24/19   Delford Maude BROCKS, MD  atorvastatin  (LIPITOR) 40 MG tablet Take 1 tablet (40 mg total) by mouth daily. 03/10/24   Strader, Laymon HERO, PA-C  Cholecalciferol  (DIALYVITE VITAMIN D  5000) 125 MCG (5000 UT) capsule Take 5,000 Units by mouth daily.    [provider]  dicyclomine  (BENTYL ) 10 MG capsule Take 1 capsule (10 mg total) by mouth 3 (three) times daily as needed for spasms. 10/16/23   Carlan, Chelsea L, NP  gabapentin  (NEURONTIN ) 300 MG capsule Take 900 mg by mouth 4 (four) times daily.    [provider]  levothyroxine  (SYNTHROID ) 50 MCG tablet Take 50 mcg by mouth daily. 04/22/23   [provider]  linaclotide  (LINZESS ) 145 MCG CAPS capsule Take 145 mcg by mouth daily before breakfast.    [provider]  mirtazapine  (REMERON ) 15 MG tablet TAKE 1 TABLET BY MOUTH DAILY AT BEDTIME  11/10/23   Carlan, Chelsea L, NP  nitroGLYCERIN  (NITROSTAT ) 0.4 MG SL tablet Place 1 tablet (0.4 mg total) under the tongue every 5 (five) minutes as needed. 07/18/21   Nishan, Peter C, MD  ondansetron  (ZOFRAN ) 4 MG tablet Take 1 tablet (4 mg total) by mouth every 8 (eight) hours as needed for nausea or vomiting. 04/08/24   Carlan, Chelsea L, NP  Polyethyl Glycol-Propyl Glycol (SYSTANE ULTRA) 0.4-0.3 % SOLN Place 2 drops into both eyes as needed (for dry eyes).     [provider]  Probiotic Product (TRUBIOTICS PO) Take 1 capsule by mouth at bedtime.    [provider]  rizatriptan  (MAXALT -MLT) 10 MG disintegrating tablet Take 10 mg by mouth as needed (for migraines and may repeat once in 2 hours, if no relief).     [provider]  triamcinolone  cream (KENALOG ) 0.1 % Apply 1 Application topically as needed. 12/27/22   [provider]    Allergies: Sulfa antibiotics and Coconut (cocos nucifera)    Review of Systems  Skin:  Positive for color change and wound.  All other systems reviewed and are negative.   Updated Vital Signs BP 106/64   Pulse (!) 53   Temp 97.8 F (36.6 C) (Oral)   Resp 15   Ht 6' 3 (1.905 m)   Wt 98 kg   SpO2 95%   BMI 27.00 kg/m  Physical Exam Vitals and nursing note reviewed.  Constitutional:      General: He is not in acute distress.    Appearance: Normal appearance. He is well-developed. He is not ill-appearing, toxic-appearing or diaphoretic.  HENT:     Head: Normocephalic and atraumatic.     Right Ear: External ear normal.     Left Ear: External ear normal.     Nose: Nose normal.     Mouth/Throat:     Mouth: Mucous membranes are moist.  Eyes:     Extraocular Movements: Extraocular movements intact.     Conjunctiva/sclera: Conjunctivae normal.  Cardiovascular:     Rate and Rhythm: Normal rate and regular rhythm.  Pulmonary:     Effort: Pulmonary effort is normal. No respiratory distress.     Breath sounds: Normal  breath sounds.  Abdominal:     General: There is no distension.     Palpations: Abdomen is soft.  Musculoskeletal:        General: Swelling, tenderness and signs of injury present.     Cervical back: Normal range of motion and neck supple.  Skin:    General: Skin is warm and dry.     Findings: Erythema present.  Neurological:     General: No focal deficit present.     Mental Status: He is alert and oriented to person, place, and time.  Psychiatric:        Mood and Affect: Mood normal.        Behavior: Behavior normal.     (all labs ordered are listed, but only abnormal results are displayed) Labs Reviewed  COMPREHENSIVE METABOLIC PANEL WITH GFR - Abnormal; Notable for the following components:      Result Value   Glucose, Bld 102 (*)    Creatinine, Ser 1.32 (*)    Total Protein 6.4 (*)    GFR, Estimated 57 (*)    All other components within normal limits  CBC WITH DIFFERENTIAL/PLATELET    EKG: None  Radiology: DG Hand 2 View Left Result Date: 04/27/2024 CLINICAL DATA:  Left hand swelling past 2 weeks after penetrating trauma. Possible infection. EXAM: LEFT HAND - 2 VIEW COMPARISON:  None Available. FINDINGS: Likely pulse oximeter obscures the second distal phalanx and part of the second D IP joint. Bones are otherwise unremarkable without acute fracture or dislocation. No bone destruction to suggest osteomyelitis. Mild degenerate change over the first carpometacarpal joint. Soft tissues are unremarkable. IMPRESSION: 1. No acute findings. 2. Mild degenerative change over the first carpometacarpal joint. Electronically Signed   By: Toribio Agreste M.D.   On: 04/27/2024 08:28     .Incision and Drainage  Date/Time: 04/27/2024 9:22 AM  Performed by: Melvenia Motto, MD Authorized by: Melvenia Motto, MD   Consent:    Consent obtained:  Verbal   Consent given by:  Patient   Risks, benefits, and alternatives were discussed: yes     Risks discussed:  Bleeding, pain and incomplete  drainage   Alternatives discussed:  No treatment Universal protocol:    Procedure explained and questions answered to patient or proxy's satisfaction: yes     Patient identity confirmed:  Verbally with patient Location:    Type:  Abscess   Size:  2cm   Location:  Upper extremity   Upper extremity location:  Hand   Hand location:  L hand Pre-procedure details:    Skin preparation:  Povidone-iodine Sedation:    Sedation type:  None Anesthesia:    Anesthesia method:  Local infiltration   Local anesthetic:  Lidocaine  1% w/o epi Procedure type:    Complexity:  Simple Procedure details:    Incision types:  Stab incision   Wound management:  Irrigated with saline and probed and deloculated   Drainage:  Serous   Drainage amount:  Moderate   Wound treatment:  Wound left open   Packing materials:  None Post-procedure details:    Procedure completion:  Tolerated well, no immediate complications    Medications Ordered in the ED  lidocaine  (PF) (XYLOCAINE ) 1 % injection 5 mL (5 mLs Intradermal Given by Other 04/27/24 0740)  doxycycline (VIBRA-TABS) tablet 100 mg (100 mg Oral Given 04/27/24 0739)                                    Medical Decision Making Amount and/or Complexity of Data Reviewed Labs: ordered. Radiology: ordered.  Risk Prescription drug management.   Patient presenting for left hand infected wound.  He sustained a wound to the dorsum of his left hand 2 weeks ago.  He developed redness, swelling, pain, and purulent drainage over the past 2 days.  On arrival in the ED, vital signs are normal.  Patient is well-appearing on exam.  Dorsum of left hand does show evidence of infection.  Wound does appear to be closed at this time.  X-ray imaging was obtained which did not show any foreign bodies.  Dose of doxycycline was given while in the ED.  Patient underwent I&D, consisting of opening of old wound and draining out what was mainly serous fluid.  Dressing was applied.   Patient was discharged in stable condition.     Final diagnoses:  Wound infection    ED Discharge Orders          Ordered    doxycycline (VIBRAMYCIN) 100 MG capsule  2 times daily        04/27/24 0925               Melvenia Motto, MD 04/27/24 [phone]

## 2024-04-27 NOTE — ED Triage Notes (Signed)
 Pt in by pov c/o of left hand pain that has gotten worse over the last 2 weeks after a grill fork on his back deck stuck him on the top of his hand. Hand is swollen and red.

## 2024-04-27 NOTE — Discharge Instructions (Signed)
 A prescription for ongoing antibiotics was sent to your pharmacy.  Take as prescribed.  Call the telephone number below to schedule a follow-up appointment with a hand doctor.  Return to the emergency department for any new or worsening symptoms of concern.

## 2024-04-28 ENCOUNTER — Ambulatory Visit (INDEPENDENT_AMBULATORY_CARE_PROVIDER_SITE_OTHER): Admitting: Orthopedic Surgery

## 2024-04-28 DIAGNOSIS — L089 Local infection of the skin and subcutaneous tissue, unspecified: Secondary | ICD-10-CM | POA: Diagnosis not present

## 2024-04-28 DIAGNOSIS — S61412A Laceration without foreign body of left hand, initial encounter: Secondary | ICD-10-CM

## 2024-04-28 MED ORDER — CHLORHEXIDINE GLUCONATE 4 % EX SOLN: Freq: Three times a day (TID) | CUTANEOUS | 0 refills | Status: AC

## 2024-04-28 NOTE — Patient Instructions (Signed)
 Mix 1 capful of chlorhexidine  with enough water  to cover the left hand.  Soak for 20-30 minutes at least 3 times per day.

## 2024-04-29 ENCOUNTER — Encounter: Payer: Self-pay | Admitting: Orthopedic Surgery

## 2024-04-29 NOTE — Progress Notes (Signed)
 New Patient Visit  Assessment: Frank Weber is a 73 y.o. male with the following: 1. Laceration of left hand with infection, initial encounter  Plan: Frank Weber suffered a puncture laceration approximately 2 weeks ago to the left hand.  He presented the emergency department yesterday where they completed a limited I&D.  He has been taking antibiotics.  He notes the swelling and pain overall has improved since being evaluated in the emergency department.  On exam today, he has some residual swelling.  There is a small I&D site on the dorsal aspect of the left hand.  Urged him to continue taking the antibiotics.  I have also recommended he initiate some chlorhexidine  soaks.  I would like to monitor him closely.  I will see him in 1 week for repeat ration.  If he has issues before then, he will return to clinic  Follow-up: Return in about 6 days (around 05/04/2024).  Subjective:  Chief Complaint  Patient presents with   Wound Check    L hand after hand was punctured by a grill utensil when he was blowing leaves DOI 04/11/24. Pt states he covered and treated hand at home until the swelling and pain got worse, went to ED yesterday. Had lesion drained and placed on antibiotics.     History of Present Illness: Frank Weber is a 73 y.o. male who presents for evaluation of left hand pain.  Approximately 2 weeks ago he sustained a laceration to the dorsal aspect of the left hand.  He was blowing leaves, lifted up a mat underneath the grill.  A grill fork was hanging from the grill and he impacted his left hand.  He states the grill fork punctured his left hand at least 1-2 cm.  He had some pain initially.  He noted that the swelling was progressively getting worse.  He has more redness.  He noted pain radiating proximally and distally.  As such, he presented to the emergency department.  In the ED, he was provided antibiotics.  He also completed a limited I&D, and per reports there is no gross  purulence, but some fluid was expressed.  He has been covering the wound with a Band-Aid.  Continues take antibiotics without issues.   Review of Systems: No fevers or chills No numbness or tingling No chest pain No shortness of breath No bowel or bladder dysfunction No GI distress No headaches   Medical History:  Past Medical History:  Diagnosis Date   Allergy    Sulfa drugs   Arthritis    right knee   Cancer (HCC)    Skin   Constipation    Frequency of urination    GERD (gastroesophageal reflux disease)    Heart murmur    slight    History of kidney stones    History of melanoma excision    2012--  NECK/ HEAD   History of squamous cell carcinoma excision    RIGHT EAR   Hyperlipidemia    Hypothyroidism    IBS (irritable bowel syndrome)    states slight   Lumbar stenosis    L5 - S1   Migraines    Peripheral neuropathy    legs and feet   Renal insufficiency    Right flank pain    Right ureteral stone    Urgency of urination    Wears glasses     Past Surgical History:  Procedure Laterality Date   BIOPSY  01/10/2021   Procedure: BIOPSY;  Surgeon: Eartha Flavors, Toribio, MD;  Location: [facility];  Service: Gastroenterology;;   CARDIAC CATHETERIZATION  07-08-2007  dr herminio    no significant CAD, preserved LVF, ef 50%//  30% pLAD   CARDIOVASCULAR STRESS TEST  06-20-2011  dr croitoru   Low risk study/  normal perfusion scan showing attenuation artifact in the anterior region of myocardium with no ischemia , infarct or scar/  normal LV funciton and wall motion , ef 62%   COLONOSCOPY N/A 02/17/2013   Rehman:examination performed to cecum. mild sigmoid colon diverticulosis, 4 small polyps ablated via cold biopsy from rectosigmoid junction (hyperplastic). External hemorrhoids.   COLONOSCOPY WITH PROPOFOL  N/A 01/10/2021   Procedure: COLONOSCOPY WITH PROPOFOL ;  Surgeon: Eartha Flavors Toribio, MD;  Location: [facility];  Service: Gastroenterology;   Laterality: N/A;   COLONOSCOPY WITH PROPOFOL  N/A 01/01/2023   Procedure: COLONOSCOPY WITH PROPOFOL ;  Surgeon: Eartha Flavors Toribio, MD;  Location: [facility];  Service: Gastroenterology;  Laterality: N/A;  10:15am;asa 2   COSMETIC SURGERY     Skin cancer repair   CYSTOSCOPY W/ URETERAL STENT PLACEMENT Right 06/18/2015   Procedure: CYSTOSCOPY WITH RIGHT RETROGRADE PYELOGRAM RIGHT URETERAL STENT PLACEMENT;  Surgeon: Norleen Seltzer, MD;  Location: [facility];  Service: Urology;  Laterality: Right;   CYSTOSCOPY/URETEROSCOPY/HOLMIUM LASER/STENT PLACEMENT Right 06/27/2015   Procedure: CYSTOSCOPY RIGHT URETEROSCOPY  STENT REMOVAL; STONE EXTRACTION WITH BASKET;  Surgeon: Norleen Seltzer, MD;  Location[facility];  Service: Urology;  Laterality: Right;   ESOPHAGOGASTRODUODENOSCOPY (EGD) WITH PROPOFOL  N/A 01/10/2021   Procedure: ESOPHAGOGASTRODUODENOSCOPY (EGD) WITH PROPOFOL ;  Surgeon: Eartha Flavors Toribio, MD;  Location: [facility];  Service: Gastroenterology;  Laterality: N/A;  11:00, moved up per Anette Caldron - pt knows arrival time   FRACTURE SURGERY     Several broken bones   HEAD & NECK SKIN LESION EXCISIONAL BIOPSY     HERNIA REPAIR     Inguinal hernia R&L   HOLMIUM LASER APPLICATION Right 06/27/2015   Procedure: HOLMIUM LASER LITHOTRIPSY ;  Surgeon: Norleen Seltzer, MD;  Location: [facility];  Service: Urology;  Laterality: Right;   I & D EXTREMITY Right 07/14/2018   Procedure: IRRIGATION AND DEBRIDEMENT RIGHT KNEE WITH CLOSURE;  Surgeon: Sharl Selinda Dover, MD;  Location: [facility];  Service: Orthopedics;  Laterality: Right;   INGUINAL HERNIA REPAIR Bilateral 10/21/2007   JOINT REPLACEMENT     Right knee   KNEE ARTHROSCOPY W/ ACL RECONSTRUCTION Right 1992   MENISCUS REPAIR Right    2019, dr gerome    POLYPECTOMY  01/10/2021   Procedure: POLYPECTOMY;  Surgeon: Eartha Flavors Toribio, MD;  Location: [facility];  Service: Gastroenterology;;   POLYPECTOMY   01/01/2023   Procedure: POLYPECTOMY INTESTINAL;  Surgeon: Eartha Flavors Toribio, MD;  Location: [facility];  Service: Gastroenterology;;   SHOULDER ARTHROSCOPY WITH OPEN ROTATOR CUFF REPAIR Left 07/27/2014   Procedure: LEFT SHOULDER ARTHROSCOPY WITH MINI OPEN ROTATOR CUFF REPAIR;  Surgeon: Dempsey JINNY Sensor, MD;  Location: [facility];  Service: Orthopedics;  Laterality: Left;   SHOULDER ARTHROSCOPY WITH OPEN ROTATOR CUFF REPAIR Right 2006   TONSILLECTOMY AND ADENOIDECTOMY  age 54   TOTAL KNEE ARTHROPLASTY Right 07/10/2018   Procedure: TOTAL KNEE ARTHROPLASTY;  Surgeon: Gerome Charleston, MD;  Location: [facility];  Service: Orthopedics;  Laterality: Right;   TRANSTHORACIC ECHOCARDIOGRAM  07/30/2007   normal LVF, ef >55%/  mild AR, MR , PR and TR/  mild AV sclerosis without stenosis/  URETEROSCOPY Right 06/18/2015   Procedure: URETEROSCOPY;  Surgeon: Norleen Seltzer, MD;  Location: [facility];  Service: Urology;  Laterality: Right;    Family History  Problem Relation Age of Onset   Arthritis Mother    Asthma Mother    Diabetes Mother    Early death Mother    Heart disease Mother    Cancer Father    Kidney disease Father    Colon cancer Other        Age of Onset-late 11's   Cancer Maternal Grandfather    Stroke Maternal Grandfather    Heart attack Paternal Grandfather 39 - 63   Heart failure Paternal Grandmother 52 - 24   Social History   Tobacco Use   Smoking status: Former    Current packs/day: 0.00    Types: Cigars, Cigarettes    Start date: 06/1975    Quit date: 10/08/2005    Years since quitting: 18.5    Passive exposure: Current   Smokeless tobacco: Never   Tobacco comments:    No cigarettes since 2007. Smoke occasion cigar.  Vaping Use   Vaping status: Never Used  Substance Use Topics   Alcohol use: Not Currently    Comment: Do not drink and alcohol daily. Have a beer or liquor occasi   Drug use: Never    Allergies  Allergen Reactions   Sulfa Antibiotics  Anaphylaxis, Shortness Of Breath and Rash   Coconut (Cocos Nucifera) Rash    Current Meds  Medication Sig   aspirin  EC 81 MG tablet Take 1 tablet (81 mg total) by mouth daily. Swallow whole.   atorvastatin  (LIPITOR) 40 MG tablet Take 1 tablet (40 mg total) by mouth daily.   chlorhexidine  (HIBICLENS ) 4 % external liquid Apply topically 3 (three) times daily. Mix 1 capful with enough water  to cover the left hand.  Soak for 20-30 minutes at least 3 times per day.   Cholecalciferol  (DIALYVITE VITAMIN D  5000) 125 MCG (5000 UT) capsule Take 5,000 Units by mouth daily.   dicyclomine  (BENTYL ) 10 MG capsule Take 1 capsule (10 mg total) by mouth 3 (three) times daily as needed for spasms.   doxycycline  (VIBRAMYCIN ) 100 MG capsule Take 1 capsule (100 mg total) by mouth 2 (two) times daily for 7 days.   gabapentin  (NEURONTIN ) 300 MG capsule Take 900 mg by mouth 4 (four) times daily.   levothyroxine  (SYNTHROID ) 50 MCG tablet Take 50 mcg by mouth daily.   linaclotide  (LINZESS ) 145 MCG CAPS capsule Take 145 mcg by mouth daily before breakfast.   mirtazapine  (REMERON ) 15 MG tablet TAKE 1 TABLET BY MOUTH DAILY AT BEDTIME   nitroGLYCERIN  (NITROSTAT ) 0.4 MG SL tablet Place 1 tablet (0.4 mg total) under the tongue every 5 (five) minutes as needed.   ondansetron  (ZOFRAN ) 4 MG tablet Take 1 tablet (4 mg total) by mouth every 8 (eight) hours as needed for nausea or vomiting.   Polyethyl Glycol-Propyl Glycol (SYSTANE ULTRA) 0.4-0.3 % SOLN Place 2 drops into both eyes as needed (for dry eyes).    Probiotic Product (TRUBIOTICS PO) Take 1 capsule by mouth at bedtime.   rizatriptan  (MAXALT -MLT) 10 MG disintegrating tablet Take 10 mg by mouth as needed (for migraines and may repeat once in 2 hours, if no relief).    triamcinolone  cream (KENALOG ) 0.1 % Apply 1 Application topically as needed.    Objective: There were no vitals taken for this visit.  Physical Exam:  General: Alert and oriented. and No acute  distress.  Gait: Normal gait.    Small wound on the dorsum of the left hand.  There is some serous drainage expressed.  Swelling is soft.  No purulence is appreciated.  No fluctuance.  He is able to make a fist.  He does have some tenderness in line with the ring finger.  IMAGING:   No new imaging obtained today   New Medications:  Meds ordered this encounter  Medications   chlorhexidine  (HIBICLENS ) 4 % external liquid    Sig: Apply topically 3 (three) times daily. Mix 1 capful with enough water  to cover the left hand.  Soak for 20-30 minutes at least 3 times per day.    Dispense:  236 mL    Refill:  0      Frank DELENA Horde, MD  04/29/2024 11:17 AM

## 2024-05-03 ENCOUNTER — Other Ambulatory Visit: Payer: Self-pay | Admitting: Family Medicine

## 2024-05-04 ENCOUNTER — Encounter: Payer: Self-pay | Admitting: Orthopedic Surgery

## 2024-05-04 ENCOUNTER — Ambulatory Visit: Admitting: Orthopedic Surgery

## 2024-05-04 DIAGNOSIS — S61412D Laceration without foreign body of left hand, subsequent encounter: Secondary | ICD-10-CM

## 2024-05-04 DIAGNOSIS — L089 Local infection of the skin and subcutaneous tissue, unspecified: Secondary | ICD-10-CM | POA: Diagnosis not present

## 2024-05-04 NOTE — Progress Notes (Signed)
 Return patient Visit  Assessment: Frank Weber is a 73 y.o. male with the following: 1. Laceration of left hand with infection, subsequent encounter  Plan: Toribio Frank Weber suffered a puncture laceration approximately 3 weeks ago to the left hand.  He underwent a limited I&D in the emergency department.  He continues take antibiotics.  Overall, his motion is very good.  No purulence is appreciated.  Swelling is getting better.  He does continue to have some tenderness, but this is consistent with the injury, and subsequent infection.  Complete the antibiotics and continue soaks for the next 1-2 days.  At this point, he will return to clinic as needed.  If he has any issues or concerns, he will call us .   Follow-up: Return if symptoms worsen or fail to improve.  Subjective:  Chief Complaint  Patient presents with   Hand Pain    Left hand pain- says its still tender, stopped draining as of today.    History of Present Illness: Frank Weber is a 73 y.o. male who returns for evaluation of left hand pain.  He suffered a puncture in the laceration to the dorsal aspect of the left hand approximately 3 weeks ago.  He was seen in the emergency department.  They completed an I&D.  He has been taking antibiotics.  He is getting better.  He does still have some tenderness.  He notes swelling.  No fevers or chills.  He also notes some occasional serosanguineous drainage.  He continues soaking his hand.  Review of Systems: No fevers or chills No numbness or tingling No chest pain No shortness of breath No bowel or bladder dysfunction No GI distress No headaches     Objective: There were no vitals taken for this visit.  Physical Exam:  General: Alert and oriented. and No acute distress. Gait: Normal gait.   Small wound to the dorsal aspect of the left ring finger, overlying the MCP.  Mild swelling.  No redness.  There is tenderness in this area.  He is able to make a full fist.  No  drainage is appreciated in clinic today.  No fluctuance.   IMAGING:   No new imaging obtained today   New Medications:  No orders of the defined types were placed in this encounter.     Oneil DELENA Horde, MD  05/04/2024 11:18 AM

## 2024-05-09 ENCOUNTER — Ambulatory Visit

## 2024-05-10 ENCOUNTER — Ambulatory Visit: Attending: Cardiovascular Disease

## 2024-05-10 DIAGNOSIS — I495 Sick sinus syndrome: Secondary | ICD-10-CM

## 2024-05-11 LAB — CUP PACEART REMOTE DEVICE CHECK
Date Time Interrogation Session: 20251130233256
Implantable Pulse Generator Implant Date: 20241204

## 2024-05-13 ENCOUNTER — Other Ambulatory Visit (INDEPENDENT_AMBULATORY_CARE_PROVIDER_SITE_OTHER): Payer: Self-pay | Admitting: Gastroenterology

## 2024-05-13 ENCOUNTER — Ambulatory Visit: Payer: Self-pay | Admitting: Cardiovascular Disease

## 2024-05-14 NOTE — Progress Notes (Signed)
 Remote Loop Recorder Transmission

## 2024-05-27 ENCOUNTER — Ambulatory Visit

## 2024-05-27 VITALS — BP 110/70 | Ht 75.0 in | Wt 219.0 lb

## 2024-05-27 DIAGNOSIS — Z0000 Encounter for general adult medical examination without abnormal findings: Secondary | ICD-10-CM | POA: Diagnosis not present

## 2024-05-28 NOTE — Progress Notes (Signed)
 "  Chief Complaint  Patient presents with   Medicare Wellness     Subjective:   Frank Weber is a 73 y.o. male who presents for a Medicare Annual Wellness Visit.  Visit info / Clinical Intake: Medicare Wellness Visit Type:: Subsequent Annual Wellness Visit Persons participating in visit and providing information:: patient Medicare Wellness Visit Mode:: Telephone If telephone:: video declined Since this visit was completed virtually, some vitals may be partially provided or unavailable. Missing vitals are due to the limitations of the virtual format.: Documented vitals are patient reported If Telephone or Video please confirm:: I connected with patient using audio/video enable telemedicine. I verified patient identity with two identifiers, discussed telehealth limitations, and patient agreed to proceed. Patient Location:: home Provider Location:: home office Interpreter Needed?: No Pre-visit prep was completed: yes AWV questionnaire completed by patient prior to visit?: yes Date:: 05/23/24 Living arrangements:: (Patient-Rptd) lives with spouse/significant other Patient's Overall Health Status Rating: (Patient-Rptd) good Typical amount of pain: (Patient-Rptd) some Does pain affect daily life?: (Patient-Rptd) no Are you currently prescribed opioids?: no  Dietary Habits and Nutritional Risks How many meals a day?: (Patient-Rptd) 3 Eats fruit and vegetables daily?: (Patient-Rptd) yes Most meals are obtained by: (Patient-Rptd) preparing own meals In the last 2 weeks, have you had any of the following?: none Diabetic:: no  Functional Status Activities of Daily Living (to include ambulation/medication): (Patient-Rptd) Independent Ambulation: (Patient-Rptd) Independent Medication Administration: (Patient-Rptd) Independent Home Management (perform basic housework or laundry): (Patient-Rptd) Independent Manage your own finances?: (Patient-Rptd) yes Primary transportation is:  (Patient-Rptd) driving Concerns about vision?: no *vision screening is required for WTM* Concerns about hearing?: no  Fall Screening Falls in the past year?: (Patient-Rptd) 0 Number of falls in past year: 0 Was there an injury with Fall?: 0 Fall Risk Category Calculator: 0 Patient Fall Risk Level: Low Fall Risk  Fall Risk Patient at Risk for Falls Due to: No Fall Risks Fall risk Follow up: Falls evaluation completed; Education provided; Falls prevention discussed  Home and Transportation Safety: All rugs have non-skid backing?: (!) (Patient-Rptd) no All stairs or steps have railings?: (Patient-Rptd) yes Grab bars in the bathtub or shower?: (!) (Patient-Rptd) no Have non-skid surface in bathtub or shower?: (Patient-Rptd) yes Good home lighting?: (Patient-Rptd) yes Regular seat belt use?: (Patient-Rptd) yes Hospital stays in the last year:: (Patient-Rptd) no  Cognitive Assessment Difficulty concentrating, remembering, or making decisions? : (Patient-Rptd) no Will 6CIT or Mini Cog be Completed: yes What year is it?: 0 points What month is it?: 0 points Give patient an address phrase to remember (5 components): [address] [place] About what time is it?: 0 points Count backwards from 20 to 1: 0 points Say the months of the year in reverse: 0 points Repeat the address phrase from earlier: 0 points 6 CIT Score: 0 points  Advance Directives (For Healthcare) Does Patient Have a Medical Advance Directive?: No Would patient like information on creating a medical advance directive?: Yes (MAU/Ambulatory/Procedural Areas - Information given)  Reviewed/Updated  Reviewed/Updated: Reviewed All (Medical, Surgical, Family, Medications, Allergies, Care Teams, Patient Goals)    Allergies (verified) Sulfa antibiotics and Coconut (cocos nucifera)   Current Medications (verified) Outpatient Encounter Medications as of 05/27/2024  Medication Sig   aspirin  EC 81 MG tablet Take 1 tablet  (81 mg total) by mouth daily. Swallow whole.   atorvastatin  (LIPITOR) 40 MG tablet Take 1 tablet (40 mg total) by mouth daily.   chlorhexidine  (HIBICLENS ) 4 % external liquid Apply topically  3 (three) times daily. Mix 1 capful with enough water  to cover the left hand.  Soak for 20-30 minutes at least 3 times per day.   Cholecalciferol  (DIALYVITE VITAMIN D  5000) 125 MCG (5000 UT) capsule Take 5,000 Units by mouth daily.   dicyclomine  (BENTYL ) 10 MG capsule Take 1 capsule (10 mg total) by mouth 3 (three) times daily as needed for spasms.   gabapentin  (NEURONTIN ) 300 MG capsule TAKE THREE CAPSULES BY MOUTH FOUR TIMES DAILY.   levothyroxine  (SYNTHROID ) 50 MCG tablet Take 50 mcg by mouth daily.   linaclotide  (LINZESS ) 145 MCG CAPS capsule Take 145 mcg by mouth daily before breakfast.   mirtazapine  (REMERON ) 15 MG tablet TAKE 1 TABLET BY MOUTH DAILY AT BEDTIME   nitroGLYCERIN  (NITROSTAT ) 0.4 MG SL tablet Place 1 tablet (0.4 mg total) under the tongue every 5 (five) minutes as needed.   ondansetron  (ZOFRAN ) 4 MG tablet Take 1 tablet (4 mg total) by mouth every 8 (eight) hours as needed for nausea or vomiting.   Polyethyl Glycol-Propyl Glycol (SYSTANE ULTRA) 0.4-0.3 % SOLN Place 2 drops into both eyes as needed (for dry eyes).    Probiotic Product (TRUBIOTICS PO) Take 1 capsule by mouth at bedtime.   rizatriptan  (MAXALT -MLT) 10 MG disintegrating tablet Take 10 mg by mouth as needed (for migraines and may repeat once in 2 hours, if no relief).    triamcinolone  cream (KENALOG ) 0.1 % Apply 1 Application topically as needed.   No facility-administered encounter medications on file as of 05/27/2024.    History: Past Medical History:  Diagnosis Date   Allergy    Sulfa drugs   Arthritis    right knee   Cancer (HCC)    Skin   Constipation    Frequency of urination    GERD (gastroesophageal reflux disease)    Heart murmur    slight    History of kidney stones    History of melanoma excision     2012--  NECK/ HEAD   History of squamous cell carcinoma excision    RIGHT EAR   Hyperlipidemia    Hypothyroidism    IBS (irritable bowel syndrome)    states slight   Lumbar stenosis    L5 - S1   Migraines    Peripheral neuropathy    legs and feet   Renal insufficiency    Right flank pain    Right ureteral stone    Urgency of urination    Wears glasses    Past Surgical History:  Procedure Laterality Date   BIOPSY  01/10/2021   Procedure: BIOPSY;  Surgeon: Eartha Angelia Sieving, MD;  Location: [facility];  Service: Gastroenterology;;   BREAST SURGERY     CARDIAC CATHETERIZATION  07-08-2007  dr herminio    no significant CAD, preserved LVF, ef 50%//  30% pLAD   CARDIOVASCULAR STRESS TEST  06-20-2011  dr croitoru   Low risk study/  normal perfusion scan showing attenuation artifact in the anterior region of myocardium with no ischemia , infarct or scar/  normal LV funciton and wall motion , ef 62%   COLONOSCOPY N/A 02/17/2013   Rehman:examination performed to cecum. mild sigmoid colon diverticulosis, 4 small polyps ablated via cold biopsy from rectosigmoid junction (hyperplastic). External hemorrhoids.   COLONOSCOPY WITH PROPOFOL  N/A 01/10/2021   Procedure: COLONOSCOPY WITH PROPOFOL ;  Surgeon: Eartha Angelia Sieving, MD;  Location: [facility];  Service: Gastroenterology;  Laterality: N/A;   COLONOSCOPY WITH PROPOFOL  N/A 01/01/2023   Procedure: COLONOSCOPY  WITH PROPOFOL ;  Surgeon: Eartha Flavors, Toribio, MD;  Location: [facility];  Service: Gastroenterology;  Laterality: N/A;  10:15am;asa 2   COSMETIC SURGERY     Skin cancer repair   CYSTOSCOPY W/ URETERAL STENT PLACEMENT Right 06/18/2015   Procedure: CYSTOSCOPY WITH RIGHT RETROGRADE PYELOGRAM RIGHT URETERAL STENT PLACEMENT;  Surgeon: Norleen Seltzer, MD;  Location: [facility];  Service: Urology;  Laterality: Right;   CYSTOSCOPY/URETEROSCOPY/HOLMIUM LASER/STENT PLACEMENT Right 06/27/2015   Procedure: CYSTOSCOPY RIGHT  URETEROSCOPY  STENT REMOVAL; STONE EXTRACTION WITH BASKET;  Surgeon: Norleen Seltzer, MD;  Location: [facility];  Service: Urology;  Laterality: Right;   ESOPHAGOGASTRODUODENOSCOPY (EGD) WITH PROPOFOL  N/A 01/10/2021   Procedure: ESOPHAGOGASTRODUODENOSCOPY (EGD) WITH PROPOFOL ;  Surgeon: Eartha Flavors Toribio, MD;  Location: [facility];  Service: Gastroenterology;  Laterality: N/A;  11:00, moved up per Anette Caldron - pt knows arrival time   FRACTURE SURGERY     Several broken bones   HEAD & NECK SKIN LESION EXCISIONAL BIOPSY     HERNIA REPAIR     Inguinal hernia R&L   HOLMIUM LASER APPLICATION Right 06/27/2015   Procedure: HOLMIUM LASER LITHOTRIPSY ;  Surgeon: Norleen Seltzer, MD;  Location: [facility];  Service: Urology;  Laterality: Right;   I & D EXTREMITY Right 07/14/2018   Procedure: IRRIGATION AND DEBRIDEMENT RIGHT KNEE WITH CLOSURE;  Surgeon: Sharl Selinda Dover, MD;  Location: [facility];  Service: Orthopedics;  Laterality: Right;   INGUINAL HERNIA REPAIR Bilateral 10/21/2007   JOINT REPLACEMENT     Right knee   KNEE ARTHROSCOPY W/ ACL RECONSTRUCTION Right 1992   MENISCUS REPAIR Right    2019, dr gerome    POLYPECTOMY  01/10/2021   Procedure: POLYPECTOMY;  Surgeon: Eartha Flavors Toribio, MD;  Location: [facility];  Service: Gastroenterology;;   POLYPECTOMY  01/01/2023   Procedure: POLYPECTOMY INTESTINAL;  Surgeon: Eartha Flavors Toribio, MD;  Location: [facility];  Service: Gastroenterology;;   SHOULDER ARTHROSCOPY WITH OPEN ROTATOR CUFF REPAIR Left 07/27/2014   Procedure: LEFT SHOULDER ARTHROSCOPY WITH MINI OPEN ROTATOR CUFF REPAIR;  Surgeon: Dempsey JINNY Sensor, MD;  Location: [facility];  Service: Orthopedics;  Laterality: Left;   SHOULDER ARTHROSCOPY WITH OPEN ROTATOR CUFF REPAIR Right 2006   TONSILLECTOMY AND ADENOIDECTOMY  age 13   TOTAL KNEE ARTHROPLASTY Right 07/10/2018   Procedure: TOTAL KNEE ARTHROPLASTY;  Surgeon: Gerome Charleston, MD;  Location: [facility];  Service: Orthopedics;  Laterality: Right;   TRANSTHORACIC ECHOCARDIOGRAM  07/30/2007   normal LVF, ef >55%/  mild AR, MR , PR and TR/  mild AV sclerosis without stenosis/    URETEROSCOPY Right 06/18/2015   Procedure: URETEROSCOPY;  Surgeon: Norleen Seltzer, MD;  Location: [facility];  Service: Urology;  Laterality: Right;   Family History  Problem Relation Age of Onset   Arthritis Mother    Asthma Mother    Diabetes Mother    Early death Mother    Heart disease Mother    Cancer Father    Kidney disease Father    Colon cancer Other        Age of Onset-late 51's   Cancer Maternal Grandfather    Stroke Maternal Grandfather    Heart attack Paternal Grandfather 71 - 99   Heart failure Paternal Grandmother 34 - 70   Social History   Occupational History   Not on file  Tobacco Use   Smoking status: Former    Current packs/day: 0.00    Types: Cigarettes,  Cigars    Start date: 06/1975    Quit date: 10/08/2005    Years since quitting: 18.6    Passive exposure: Current   Smokeless tobacco: Never   Tobacco comments:    No cigarettes since 2007. Smoke occasion cigar.  Vaping Use   Vaping status: Never Used  Substance and Sexual Activity   Alcohol use: Yes    Comment: Do not drink any alcohol daily. Have a beer or liquor occasi   Drug use: Never   Sexual activity: Not Currently    Birth control/protection: None   Tobacco Counseling Counseling given: Yes Tobacco comments: No cigarettes since 2007. Smoke occasion cigar.  SDOH Screenings   Food Insecurity: No Food Insecurity (05/23/2024)  Housing: Low Risk (05/23/2024)  Transportation Needs: No Transportation Needs (05/23/2024)  Utilities: Not At Risk (05/28/2024)  Alcohol Screen: Low Risk (05/23/2024)  Depression (PHQ2-9): Low Risk (05/28/2024)  Financial Resource Strain: Low Risk (05/23/2024)  Physical Activity: Insufficiently Active (05/23/2024)  Social Connections: Moderately Isolated (05/23/2024)   Stress: No Stress Concern Present (05/23/2024)  Tobacco Use: Medium Risk (05/27/2024)  Health Literacy: Adequate Health Literacy (05/28/2024)   See flowsheets for full screening details  Depression Screen PHQ 2 & 9 Depression Scale- Over the past 2 weeks, how often have you been bothered by any of the following problems? Little interest or pleasure in doing things: 0 Feeling down, depressed, or hopeless (PHQ Adolescent also includes...irritable): 0 PHQ-2 Total Score: 0 Trouble falling or staying asleep, or sleeping too much: 0 Feeling tired or having little energy: 0 Poor appetite or overeating (PHQ Adolescent also includes...weight loss): 0 Feeling bad about yourself - or that you are a failure or have let yourself or your family down: 0 Trouble concentrating on things, such as reading the newspaper or watching television (PHQ Adolescent also includes...like school work): 0 Moving or speaking so slowly that other people could have noticed. Or the opposite - being so fidgety or restless that you have been moving around a lot more than usual: 0 Thoughts that you would be better off dead, or of hurting yourself in some way: 0 PHQ-9 Total Score: 0 If you checked off any problems, how difficult have these problems made it for you to do your work, take care of things at home, or get along with other people?: Not difficult at all     Goals Addressed             This Visit's Progress    remain active               Objective:    Today's Vitals   05/27/24 1127  BP: 110/70  Weight: 219 lb (99.3 kg)  Height: 6' 3 (1.905 m)   Body mass index is 27.37 kg/m.  Hearing/Vision screen Hearing Screening - Comments:: Patient denies any hearing difficulties.   Vision Screening - Comments:: Wears rx glasses - up to date with routine eye exams    Immunizations and Health Maintenance Health Maintenance  Topic Date Due   COVID-19 Vaccine (1) Never done   Zoster Vaccines- Shingrix (1  of 2) Never done   Medicare Annual Wellness (AWV)  02/26/2024   DTaP/Tdap/Td (2 - Td or Tdap) 03/30/2027   Colonoscopy  01/01/2028   Pneumococcal Vaccine: 50+ Years  Completed   Influenza Vaccine  Completed   Hepatitis C Screening  Completed   Meningococcal B Vaccine  Aged Out        Assessment/Plan:  This is a routine  wellness examination for Kenna.  Patient Care Team: Bevely Doffing, FNP as PCP - General (Family Medicine) Delford Maude BROCKS, MD as Consulting Physician (Cardiology) Mealor, Eulas BRAVO, MD as Consulting Physician (Cardiology) Onesimo Oneil LABOR, MD as Consulting Physician (Orthopedic Surgery) Camella Fallow, MD as Consulting Physician (Orthopedic Surgery)  I have personally reviewed and noted the following in the patients chart:   Medical and social history Use of alcohol, tobacco or illicit drugs  Current medications and supplements including opioid prescriptions. Functional ability and status Nutritional status Physical activity Advanced directives List of other physicians Hospitalizations, surgeries, and ER visits in previous 12 months Vitals Screenings to include cognitive, depression, and falls Referrals and appointments  No orders of the defined types were placed in this encounter.  In addition, I have reviewed and discussed with patient certain preventive protocols, quality metrics, and best practice recommendations. A written personalized care plan for preventive services as well as general preventive health recommendations were provided to patient.   Jaclyn Andy, CMA   05/28/2024   Return on Tuesday May 31, 2025 at 8am, for your yearly Medicare Wellness Visit in person.  After Visit Summary: (MyChart) Due to this being a telephonic visit, the after visit summary with patients personalized plan was offered to patient via MyChart    "

## 2024-05-28 NOTE — Patient Instructions (Signed)
 [name],  Thank you for taking the time for your Medicare Wellness Visit. I appreciate your continued commitment to your health goals. Please review the care plan we discussed, and feel free to reach out if I can assist you further.  Please note that Annual Wellness Visits do not include a physical exam. Some assessments may be limited, especially if the visit was conducted virtually. If needed, we may recommend an in-person follow-up with your provider.  Ongoing Care Seeing your primary care provider every 3 to 6 months helps us  monitor your health and provide consistent, personalized care.   1 year follow up for Medicare well visit: Tuesday May 31, 2025 at 8am with medicare wellness nurse in office  Referrals If a referral was made during today's visit and you haven't received any updates within two weeks, please contact the referred provider directly to check on the status.  Recommended Screenings:  Health Maintenance  Topic Date Due   COVID-19 Vaccine (1) Never done   Zoster (Shingles) Vaccine (1 of 2) Never done   Medicare Annual Wellness Visit  02/26/2024   DTaP/Tdap/Td vaccine (2 - Td or Tdap) 03/30/2027   Colon Cancer Screening  01/01/2028   Pneumococcal Vaccine for age over 18  Completed   Flu Shot  Completed   Hepatitis C Screening  Completed   Meningitis B Vaccine  Aged Out       05/23/2024    9:09 AM  Advanced Directives  Does Patient Have a Medical Advance Directive? No  Would patient like information on creating a medical advance directive? Yes (MAU/Ambulatory/Procedural Areas - Information given)    Vision: Annual vision screenings are recommended for early detection of glaucoma, cataracts, and diabetic retinopathy. These exams can also reveal signs of chronic conditions such as diabetes and high blood pressure.  Dental: Annual dental screenings help detect early signs of oral cancer, gum disease, and other conditions linked to overall health, including  heart disease and diabetes.  Please see the attached documents for additional preventive care recommendations.

## 2024-06-08 ENCOUNTER — Other Ambulatory Visit: Payer: Self-pay

## 2024-06-09 ENCOUNTER — Ambulatory Visit

## 2024-06-10 ENCOUNTER — Other Ambulatory Visit (INDEPENDENT_AMBULATORY_CARE_PROVIDER_SITE_OTHER): Payer: Self-pay | Admitting: Gastroenterology

## 2024-06-10 ENCOUNTER — Ambulatory Visit: Attending: Cardiovascular Disease

## 2024-06-10 DIAGNOSIS — I495 Sick sinus syndrome: Secondary | ICD-10-CM | POA: Diagnosis not present

## 2024-06-10 DIAGNOSIS — R110 Nausea: Secondary | ICD-10-CM

## 2024-06-11 LAB — CUP PACEART REMOTE DEVICE CHECK
Date Time Interrogation Session: 20251231233117
Implantable Pulse Generator Implant Date: 20241204

## 2024-06-14 ENCOUNTER — Ambulatory Visit: Admitting: Cardiovascular Disease

## 2024-06-15 LAB — LIPID PANEL
Chol/HDL Ratio: 3 ratio (ref 0.0–5.0)
Cholesterol, Total: 161 mg/dL (ref 100–199)
HDL: 53 mg/dL
LDL Chol Calc (NIH): 89 mg/dL (ref 0–99)
Triglycerides: 103 mg/dL (ref 0–149)
VLDL Cholesterol Cal: 19 mg/dL (ref 5–40)

## 2024-06-15 LAB — HEPATIC FUNCTION PANEL
ALT: 16 IU/L (ref 0–44)
AST: 14 IU/L (ref 0–40)
Albumin: 4.4 g/dL (ref 3.8–4.8)
Alkaline Phosphatase: 98 IU/L (ref 47–123)
Bilirubin Total: 0.9 mg/dL (ref 0.0–1.2)
Bilirubin, Direct: 0.25 mg/dL (ref 0.00–0.40)
Total Protein: 6.2 g/dL (ref 6.0–8.5)

## 2024-06-15 NOTE — Progress Notes (Signed)
 Remote Loop Recorder Transmission

## 2024-06-16 ENCOUNTER — Ambulatory Visit: Payer: Self-pay | Admitting: Student

## 2024-06-16 DIAGNOSIS — Z79899 Other long term (current) drug therapy: Secondary | ICD-10-CM

## 2024-06-16 MED ORDER — ATORVASTATIN CALCIUM 80 MG PO TABS: 80.0000 mg | ORAL_TABLET | Freq: Every day | ORAL | 3 refills | Status: AC

## 2024-06-16 NOTE — Telephone Encounter (Signed)
 The patient has been notified of the result and verbalized understanding.  All questions (if any) were answered. Bernett Dorothyann LABOR, RN 06/16/2024 4:18 PM     Patient says it will be harder for him to modify his diet and wishes to increase atorvastatin  to 80 mg  daily. He will repeat FLP mid April at [facility]

## 2024-06-16 NOTE — Telephone Encounter (Signed)
-----   Message from Laymon CHRISTELLA Qua sent at 06/16/2024 10:04 AM EST ----- Please let the patient know that his liver function enzymes are within a normal range. Cholesterol has improved but is still slightly above goal. His total cholesterol was previously at 171 and is  now down to 161. LDL was previously 100 and now down to 89. We prefer for this to be less than 70 given his coronary calcification. Options would include continuing current medical therapy for now  with Atorvastatin  40 mg daily and rechecking an FLP in 3 to 4 months while continuing to limit fried foods and processed foods in the interim. Another option would be titrating Atorvastatin  to 80 mg  daily and rechecking in 3 to 4 months as well while still focusing on consuming a heart-healthy diet.

## 2024-06-19 ENCOUNTER — Ambulatory Visit: Payer: Self-pay | Admitting: Cardiovascular Disease

## 2024-06-22 ENCOUNTER — Ambulatory Visit: Payer: Self-pay | Admitting: Family Medicine

## 2024-06-23 ENCOUNTER — Encounter: Payer: Self-pay | Admitting: Nurse Practitioner

## 2024-06-23 ENCOUNTER — Ambulatory Visit (INDEPENDENT_AMBULATORY_CARE_PROVIDER_SITE_OTHER): Admitting: Nurse Practitioner

## 2024-06-23 VITALS — BP 126/79 | HR 50 | Ht 75.0 in | Wt 220.0 lb

## 2024-06-23 DIAGNOSIS — K5790 Diverticulosis of intestine, part unspecified, without perforation or abscess without bleeding: Secondary | ICD-10-CM

## 2024-06-23 DIAGNOSIS — K2100 Gastro-esophageal reflux disease with esophagitis, without bleeding: Secondary | ICD-10-CM | POA: Diagnosis not present

## 2024-06-23 DIAGNOSIS — E039 Hypothyroidism, unspecified: Secondary | ICD-10-CM

## 2024-06-23 DIAGNOSIS — K582 Mixed irritable bowel syndrome: Secondary | ICD-10-CM | POA: Diagnosis not present

## 2024-06-23 DIAGNOSIS — G609 Hereditary and idiopathic neuropathy, unspecified: Secondary | ICD-10-CM | POA: Diagnosis not present

## 2024-06-23 DIAGNOSIS — E782 Mixed hyperlipidemia: Secondary | ICD-10-CM

## 2024-06-23 DIAGNOSIS — F5101 Primary insomnia: Secondary | ICD-10-CM | POA: Insufficient documentation

## 2024-06-23 DIAGNOSIS — K5901 Slow transit constipation: Secondary | ICD-10-CM

## 2024-06-23 NOTE — Addendum Note (Signed)
 Addended by: GLADIS MUSTARD on: 06/23/2024 02:04 PM   Modules accepted: Level of Service

## 2024-06-23 NOTE — Addendum Note (Signed)
 Addended by: GLADIS MUSTARD on: 06/23/2024 02:06 PM   Modules accepted: Level of Service

## 2024-06-23 NOTE — Patient Instructions (Signed)
 Diet for Irritable Bowel Syndrome When you have irritable bowel syndrome (IBS), it is very important to follow the eating habits that are best for your condition. IBS may cause various symptoms, such as pain in the abdomen, constipation, or diarrhea. Choosing the right foods can help to ease the discomfort from these symptoms. Work with your health care provider and dietitian to find the eating plan that will help to control your symptoms. What are tips for following this plan?  Keep a food diary. This will help you identify foods that cause symptoms. Write down: What you eat and when you eat it. What symptoms you have. When symptoms occur in relation to your meals, such as "pain in abdomen 2 hours after dinner." Eat your meals slowly and in a relaxed setting. Aim to eat 5-6 small meals per day. Do not skip meals. Drink enough fluid to keep your urine pale yellow. Ask your health care provider if you should take an over-the-counter probiotic to help restore healthy bacteria in your gut (digestive tract). Probiotics are foods that contain good bacteria and yeasts. Your dietitian may have specific dietary recommendations for you based on your symptoms. Your dietitian may recommend that you: Avoid foods that cause symptoms. Talk with your dietitian about other ways to get the same nutrients that are in those problem foods. Avoid foods with gluten. Gluten is a protein that is found in rye, wheat, and barley. Eat more foods that contain soluble fiber. Examples of foods with high soluble fiber include oats, seeds, and certain fruits and vegetables. Take a fiber supplement if told by your dietitian. Reduce or avoid certain foods called FODMAPs. These are foods that contain sugars that are hard for some people to digest. Ask your health care provider which foods to avoid. What foods should I avoid? The following are some foods and drinks that may make your symptoms worse: Fatty foods, such as french  fries. Foods that contain gluten, such as pasta and cereal. Dairy products, such as milk, cheese, and ice cream. Spicy foods. Alcohol. Products with caffeine, such as coffee, tea, or chocolate. Carbonated drinks, such as soda. Foods that are high in FODMAPs. These include certain fruits and vegetables. Products with sweeteners such as honey, high fructose corn syrup, sorbitol, and mannitol. The items listed above may not be a complete list of foods and beverages you should avoid. Contact a dietitian for more information. What foods are good sources of fiber? Your health care provider or dietitian may recommend that you eat more foods that contain fiber. Fiber can help to reduce constipation and other IBS symptoms. Add foods with fiber to your diet a little at a time so your body can get used to them. Too much fiber at one time might cause gas and swelling of your abdomen. The following are some foods that are good sources of fiber: Berries, such as raspberries, strawberries, and blueberries. Tomatoes. Carrots. Brown rice. Oats. Seeds, such as chia and pumpkin seeds. The items listed above may not be a complete list of recommended sources of fiber. Contact your dietitian for more options. Where to find more information International Foundation for Functional Gastrointestinal Disorders: aboutibs.[employer] of Diabetes and Digestive and Kidney Diseases: StageSync.si Summary When you have irritable bowel syndrome (IBS), it is very important to follow the eating habits that are best for your condition. IBS may cause various symptoms, such as pain in the abdomen, constipation, or diarrhea. Choosing the right foods can help to ease the  discomfort that comes from symptoms. Your health care provider or dietitian may recommend that you eat more foods that contain fiber. Keep a food diary. This will help you identify foods that cause symptoms. This information is not intended to replace  advice given to you by your health care provider. Make sure you discuss any questions you have with your health care provider. Document Revised: 05/08/2021 Document Reviewed: 05/08/2021 Elsevier Patient Education  2024 [employer].

## 2024-06-23 NOTE — Progress Notes (Signed)
 "  Subjective:    Patient ID: Frank Weber, male    DOB: Sep 07, 1950, 74 y.o.   MRN: [id number]  Chief Complaint: medical management of chronic issues     HPI:  AMALIO LOE is a 74 y.o. who identifies as a male who was assigned male at birth.   Social history: Lives with: wife Work history: retired   Water Engineer in today for follow up of the following chronic medical issues:  1. Mixed hyperlipidemia Does try to watch diet and stay active Lab Results  Component Value Date   CHOL 161 06/14/2024   HDL 53 06/14/2024   LDLCALC 89 06/14/2024   TRIG 103 06/14/2024   CHOLHDL 3.0 06/14/2024   The ASCVD Risk score (Arnett DK, et al., 2019) failed to calculate for the following reasons:   Risk score cannot be calculated because patient has a medical history suggesting prior/existing ASCVD   * - Cholesterol units were assumed    2. Gastroesophageal reflux disease with esophagitis without hemorrhage Uses OTC meds as needed. Does not bother him on a regular basis  3. Irritable bowel syndrome with both constipation and diarrhea Takes bentyl  as needed. Has intermittent flare ups.  4. Diverticulosis of colon Denies any recent flare ups.  5. Acquired hypothyroidism No issues that he is awareof. Lab Results  Component Value Date   TSH 1.970 02/20/2024     6. Hereditary and idiopathic peripheral neuropathy Mainly in his feet. Is on gabapentin  900mg  tid  7. Slow transit constipation Is on linzess  as needed. helps  8. Insomnia Is on remeron  nightly to help him sleep. Works well most nights.  New complaints: None today  Allergies[1] Outpatient Encounter Medications as of 06/23/2024  Medication Sig   aspirin  EC 81 MG tablet Take 1 tablet (81 mg total) by mouth daily. Swallow whole.   atorvastatin  (LIPITOR) 80 MG tablet Take 1 tablet (80 mg total) by mouth daily.   chlorhexidine  (HIBICLENS ) 4 % external liquid Apply topically 3 (three) times daily. Mix 1 capful with enough  water  to cover the left hand.  Soak for 20-30 minutes at least 3 times per day.   Cholecalciferol  (DIALYVITE VITAMIN D  5000) 125 MCG (5000 UT) capsule Take 5,000 Units by mouth daily.   dicyclomine  (BENTYL ) 10 MG capsule Take 1 capsule (10 mg total) by mouth 3 (three) times daily as needed for spasms.   gabapentin  (NEURONTIN ) 300 MG capsule TAKE THREE CAPSULES BY MOUTH FOUR TIMES DAILY.   levothyroxine  (SYNTHROID ) 50 MCG tablet TAKE ONE TABLET BY MOUTH EVERY DAY.   linaclotide  (LINZESS ) 145 MCG CAPS capsule Take 145 mcg by mouth daily before breakfast.   mirtazapine  (REMERON ) 15 MG tablet TAKE 1 TABLET BY MOUTH DAILY AT BEDTIME   nitroGLYCERIN  (NITROSTAT ) 0.4 MG SL tablet Place 1 tablet (0.4 mg total) under the tongue every 5 (five) minutes as needed.   ondansetron  (ZOFRAN ) 4 MG tablet TAKE ONE TABLET BY MOUTH EVERY EIGHT HOURS AS NEEDED FOR NAUSEA OR FOR VOMITING   Polyethyl Glycol-Propyl Glycol (SYSTANE ULTRA) 0.4-0.3 % SOLN Place 2 drops into both eyes as needed (for dry eyes).    Probiotic Product (TRUBIOTICS PO) Take 1 capsule by mouth at bedtime.   rizatriptan  (MAXALT -MLT) 10 MG disintegrating tablet Take 10 mg by mouth as needed (for migraines and may repeat once in 2 hours, if no relief).    triamcinolone  cream (KENALOG ) 0.1 % Apply 1 Application topically as needed.   No facility-administered encounter medications on file as  of 06/23/2024.    Past Surgical History:  Procedure Laterality Date   BIOPSY  01/10/2021   Procedure: BIOPSY;  Surgeon: Eartha Flavors, Toribio, MD;  Location: [facility];  Service: Gastroenterology;;   BREAST SURGERY     CARDIAC CATHETERIZATION  07-08-2007  dr herminio    no significant CAD, preserved LVF, ef 50%//  30% pLAD   CARDIOVASCULAR STRESS TEST  06-20-2011  dr croitoru   Low risk study/  normal perfusion scan showing attenuation artifact in the anterior region of myocardium with no ischemia , infarct or scar/  normal LV funciton and wall motion , ef  62%   COLONOSCOPY N/A 02/17/2013   Rehman:examination performed to cecum. mild sigmoid colon diverticulosis, 4 small polyps ablated via cold biopsy from rectosigmoid junction (hyperplastic). External hemorrhoids.   COLONOSCOPY WITH PROPOFOL  N/A 01/10/2021   Procedure: COLONOSCOPY WITH PROPOFOL ;  Surgeon: Eartha Flavors Toribio, MD;  Location: [facility];  Service: Gastroenterology;  Laterality: N/A;   COLONOSCOPY WITH PROPOFOL  N/A 01/01/2023   Procedure: COLONOSCOPY WITH PROPOFOL ;  Surgeon: Eartha Flavors Toribio, MD;  Location: [facility];  Service: Gastroenterology;  Laterality: N/A;  10:15am;asa 2   COSMETIC SURGERY     Skin cancer repair   CYSTOSCOPY W/ URETERAL STENT PLACEMENT Right 06/18/2015   Procedure: CYSTOSCOPY WITH RIGHT RETROGRADE PYELOGRAM RIGHT URETERAL STENT PLACEMENT;  Surgeon: Norleen Seltzer, MD;  Location: [facility];  Service: Urology;  Laterality: Right;   CYSTOSCOPY/URETEROSCOPY/HOLMIUM LASER/STENT PLACEMENT Right 06/27/2015   Procedure: CYSTOSCOPY RIGHT URETEROSCOPY  STENT REMOVAL; STONE EXTRACTION WITH BASKET;  Surgeon: Norleen Seltzer, MD;  Location[facility];  Service: Urology;  Laterality: Right;   ESOPHAGOGASTRODUODENOSCOPY (EGD) WITH PROPOFOL  N/A 01/10/2021   Procedure: ESOPHAGOGASTRODUODENOSCOPY (EGD) WITH PROPOFOL ;  Surgeon: Eartha Flavors Toribio, MD;  Location: [facility];  Service: Gastroenterology;  Laterality: N/A;  11:00, moved up per Anette Caldron - pt knows arrival time   FRACTURE SURGERY     Several broken bones   HEAD & NECK SKIN LESION EXCISIONAL BIOPSY     HERNIA REPAIR     Inguinal hernia R&L   HOLMIUM LASER APPLICATION Right 06/27/2015   Procedure: HOLMIUM LASER LITHOTRIPSY ;  Surgeon: Norleen Seltzer, MD;  Location[facility];  Service: Urology;  Laterality: Right;   I & D EXTREMITY Right 07/14/2018   Procedure: IRRIGATION AND DEBRIDEMENT RIGHT KNEE WITH CLOSURE;  Surgeon: Sharl Selinda Dover, MD;  Location: [facility];   Service: Orthopedics;  Laterality: Right;   INGUINAL HERNIA REPAIR Bilateral 10/21/2007   JOINT REPLACEMENT     Right knee   KNEE ARTHROSCOPY W/ ACL RECONSTRUCTION Right 1992   MENISCUS REPAIR Right    2019, dr gerome    POLYPECTOMY  01/10/2021   Procedure: POLYPECTOMY;  Surgeon: Eartha Flavors Toribio, MD;  Location: [facility];  Service: Gastroenterology;;   POLYPECTOMY  01/01/2023   Procedure: POLYPECTOMY INTESTINAL;  Surgeon: Eartha Flavors Toribio, MD;  Location: [facility];  Service: Gastroenterology;;   SHOULDER ARTHROSCOPY WITH OPEN ROTATOR CUFF REPAIR Left 07/27/2014   Procedure: LEFT SHOULDER ARTHROSCOPY WITH MINI OPEN ROTATOR CUFF REPAIR;  Surgeon: Dempsey JINNY Sensor, MD;  Location: [facility];  Service: Orthopedics;  Laterality: Left;   SHOULDER ARTHROSCOPY WITH OPEN ROTATOR CUFF REPAIR Right 2006   TONSILLECTOMY AND ADENOIDECTOMY  age 28   TOTAL KNEE ARTHROPLASTY Right 07/10/2018   Procedure: TOTAL KNEE ARTHROPLASTY;  Surgeon: Gerome Charleston, MD;  Location: [facility];  Service: Orthopedics;  Laterality: Right;  TRANSTHORACIC ECHOCARDIOGRAM  07/30/2007   normal LVF, ef >55%/  mild AR, MR , PR and TR/  mild AV sclerosis without stenosis/    URETEROSCOPY Right 06/18/2015   Procedure: URETEROSCOPY;  Surgeon: Norleen Seltzer, MD;  Location: [facility];  Service: Urology;  Laterality: Right;    Family History  Problem Relation Age of Onset   Arthritis Mother    Asthma Mother    Diabetes Mother    Early death Mother    Heart disease Mother    Cancer Father    Kidney disease Father    Colon cancer Other        Age of Onset-late 80's   Cancer Maternal Grandfather    Stroke Maternal Grandfather    Heart attack Paternal Grandfather 22 - 99   Heart failure Paternal Grandmother 31 - 99       Review of Systems  Constitutional:  Negative for diaphoresis.  Eyes:  Negative for pain.  Respiratory:  Negative for shortness of breath.   Cardiovascular:  Negative  for chest pain, palpitations and leg swelling.  Gastrointestinal:  Negative for abdominal pain.  Endocrine: Negative for polydipsia.  Skin:  Negative for rash.  Neurological:  Negative for dizziness, weakness and headaches.  Hematological:  Does not bruise/bleed easily.  All other systems reviewed and are negative.      Objective:   Physical Exam Vitals and nursing note reviewed.  Constitutional:      Appearance: Normal appearance. He is well-developed.  HENT:     Head: Normocephalic.     Nose: Nose normal.     Mouth/Throat:     Mouth: Mucous membranes are moist.     Pharynx: Oropharynx is clear.  Eyes:     Pupils: Pupils are equal, round, and reactive to light.  Neck:     Thyroid : No thyroid  mass or thyromegaly.     Vascular: No carotid bruit or JVD.     Trachea: Phonation normal.  Cardiovascular:     Rate and Rhythm: Normal rate and regular rhythm.  Pulmonary:     Effort: Pulmonary effort is normal. No respiratory distress.     Breath sounds: Normal breath sounds.  Abdominal:     General: Bowel sounds are normal.     Palpations: Abdomen is soft.     Tenderness: There is no abdominal tenderness.  Musculoskeletal:        General: Normal range of motion.     Cervical back: Normal range of motion and neck supple.  Lymphadenopathy:     Cervical: No cervical adenopathy.  Skin:    General: Skin is warm and dry.  Neurological:     Mental Status: He is alert and oriented to person, place, and time.  Psychiatric:        Behavior: Behavior normal.        Thought Content: Thought content normal.        Judgment: Judgment normal.    BP 126/79   Pulse (!) 50   Ht 6' 3 (1.905 m)   Wt 220 lb (99.8 kg)   SpO2 99%   BMI 27.50 kg/m         Assessment & Plan:   ZAYDEN MAFFEI comes in today with chief complaint of Medical Management of Chronic Issues (Four month follow up )   Diagnosis and orders addressed:  1. Mixed hyperlipidemia (Primary) Low fat diet  2.  Gastroesophageal reflux disease with esophagitis without hemorrhage Avoid spicy foods Do not eat 2  hours prior to bedtime   3. Irritable bowel syndrome with both constipation and diarrhea Increase fiber in diet Keep followup with GI  4. Diverticulosis Watch diet to prevent flare up  5. Acquired hypothyroidism Will check labs at next visit  6. Hereditary and idiopathic peripheral neuropathy Continue gabepentin  7. Slow transit constipation Continue linzess  Increase fiber in diet  8. Primary insomnia Bedtime routine   Labs pending Health Maintenance reviewed Diet and exercise encouraged  Follow up plan: 6 months   Mary-Margaret Gladis, FNP     [1]  Allergies Allergen Reactions   Sulfa Antibiotics Anaphylaxis, Shortness Of Breath and Rash   Coconut (Cocos Nucifera) Rash   "

## 2024-07-02 ENCOUNTER — Ambulatory Visit: Admitting: Cardiovascular Disease

## 2024-07-10 ENCOUNTER — Ambulatory Visit

## 2024-07-11 ENCOUNTER — Ambulatory Visit: Attending: Cardiovascular Disease

## 2024-07-11 DIAGNOSIS — I495 Sick sinus syndrome: Secondary | ICD-10-CM

## 2024-07-13 LAB — CUP PACEART REMOTE DEVICE CHECK
Date Time Interrogation Session: 20260131232233
Implantable Pulse Generator Implant Date: 20241204

## 2024-07-16 NOTE — Progress Notes (Signed)
 Remote Loop Recorder Transmission

## 2024-08-03 ENCOUNTER — Ambulatory Visit: Admitting: Cardiovascular Disease

## 2024-08-11 ENCOUNTER — Ambulatory Visit

## 2024-09-11 ENCOUNTER — Ambulatory Visit

## 2024-10-12 ENCOUNTER — Ambulatory Visit

## 2024-11-12 ENCOUNTER — Ambulatory Visit

## 2024-12-13 ENCOUNTER — Ambulatory Visit

## 2024-12-22 ENCOUNTER — Ambulatory Visit: Payer: Self-pay | Admitting: Nurse Practitioner

## 2025-01-13 ENCOUNTER — Ambulatory Visit

## 2025-02-13 ENCOUNTER — Ambulatory Visit

## 2025-03-16 ENCOUNTER — Ambulatory Visit

## 2025-04-16 ENCOUNTER — Ambulatory Visit

## 2025-05-17 ENCOUNTER — Ambulatory Visit

## 2025-05-31 ENCOUNTER — Ambulatory Visit: Payer: Self-pay

## 2025-06-17 ENCOUNTER — Ambulatory Visit
